# Patient Record
Sex: Female | Born: 1950
Health system: Southern US, Community
[De-identification: ages and names within clinical notes are randomized; demographics above are authoritative.]

## PROBLEM LIST (undated history)

## (undated) DIAGNOSIS — D631 Anemia in chronic kidney disease: Secondary | ICD-10-CM

## (undated) DIAGNOSIS — K5790 Diverticulosis of intestine, part unspecified, without perforation or abscess without bleeding: Secondary | ICD-10-CM

## (undated) DIAGNOSIS — E669 Obesity, unspecified: Secondary | ICD-10-CM

## (undated) DIAGNOSIS — I2729 Other secondary pulmonary hypertension: Secondary | ICD-10-CM

## (undated) DIAGNOSIS — N186 End stage renal disease: Secondary | ICD-10-CM

## (undated) DIAGNOSIS — Z87891 Personal history of nicotine dependence: Secondary | ICD-10-CM

## (undated) DIAGNOSIS — R519 Headache, unspecified: Secondary | ICD-10-CM

## (undated) DIAGNOSIS — R0600 Dyspnea, unspecified: Secondary | ICD-10-CM

## (undated) DIAGNOSIS — M1990 Unspecified osteoarthritis, unspecified site: Secondary | ICD-10-CM

## (undated) DIAGNOSIS — I2510 Atherosclerotic heart disease of native coronary artery without angina pectoris: Secondary | ICD-10-CM

## (undated) DIAGNOSIS — I700 Atherosclerosis of aorta: Secondary | ICD-10-CM

## (undated) DIAGNOSIS — E785 Hyperlipidemia, unspecified: Secondary | ICD-10-CM

## (undated) DIAGNOSIS — D249 Benign neoplasm of unspecified breast: Secondary | ICD-10-CM

## (undated) DIAGNOSIS — F419 Anxiety disorder, unspecified: Secondary | ICD-10-CM

## (undated) DIAGNOSIS — I5189 Other ill-defined heart diseases: Secondary | ICD-10-CM

## (undated) DIAGNOSIS — N189 Chronic kidney disease, unspecified: Secondary | ICD-10-CM

## (undated) DIAGNOSIS — D3501 Benign neoplasm of right adrenal gland: Secondary | ICD-10-CM

## (undated) DIAGNOSIS — I10 Essential (primary) hypertension: Secondary | ICD-10-CM

## (undated) DIAGNOSIS — I729 Aneurysm of unspecified site: Secondary | ICD-10-CM

## (undated) DIAGNOSIS — C642 Malignant neoplasm of left kidney, except renal pelvis: Secondary | ICD-10-CM

## (undated) DIAGNOSIS — K8020 Calculus of gallbladder without cholecystitis without obstruction: Secondary | ICD-10-CM

## (undated) DIAGNOSIS — J189 Pneumonia, unspecified organism: Secondary | ICD-10-CM

## (undated) DIAGNOSIS — Z992 Dependence on renal dialysis: Secondary | ICD-10-CM

## (undated) DIAGNOSIS — Z1211 Encounter for screening for malignant neoplasm of colon: Secondary | ICD-10-CM

## (undated) DIAGNOSIS — N6459 Other signs and symptoms in breast: Secondary | ICD-10-CM

## (undated) DIAGNOSIS — R928 Other abnormal and inconclusive findings on diagnostic imaging of breast: Secondary | ICD-10-CM

## (undated) DIAGNOSIS — J439 Emphysema, unspecified: Secondary | ICD-10-CM

## (undated) DIAGNOSIS — D649 Anemia, unspecified: Secondary | ICD-10-CM

## (undated) HISTORY — PX: EYE SURGERY: SHX253

## (undated) HISTORY — DX: Essential (primary) hypertension: I10

## (undated) HISTORY — DX: Personal history of nicotine dependence: Z87.891

## (undated) HISTORY — DX: Benign neoplasm of unspecified breast: D24.9

## (undated) HISTORY — DX: Other signs and symptoms in breast: N64.59

## (undated) HISTORY — DX: Obesity, unspecified: E66.9

## (undated) HISTORY — DX: Other abnormal and inconclusive findings on diagnostic imaging of breast: R92.8

## (undated) HISTORY — DX: Anemia, unspecified: D64.9

## (undated) HISTORY — DX: Encounter for screening for malignant neoplasm of colon: Z12.11

---

## 1991-12-01 HISTORY — PX: ABDOMINAL HYSTERECTOMY: SHX81

## 1995-12-01 HISTORY — PX: TONSILLECTOMY: SUR1361

## 1998-09-20 ENCOUNTER — Ambulatory Visit (HOSPITAL_BASED_OUTPATIENT_CLINIC_OR_DEPARTMENT_OTHER): Admission: RE | Admit: 1998-09-20 | Discharge: 1998-09-20 | Payer: Self-pay | Admitting: Otolaryngology

## 2001-11-30 HISTORY — PX: FOOT SURGERY: SHX648

## 2002-11-30 DIAGNOSIS — I729 Aneurysm of unspecified site: Secondary | ICD-10-CM

## 2002-11-30 DIAGNOSIS — I671 Cerebral aneurysm, nonruptured: Secondary | ICD-10-CM

## 2002-11-30 HISTORY — DX: Aneurysm of unspecified site: I72.9

## 2002-11-30 HISTORY — PX: CEREBRAL ANEURYSM REPAIR: SHX164

## 2002-11-30 HISTORY — DX: Cerebral aneurysm, nonruptured: I67.1

## 2003-06-27 DIAGNOSIS — I609 Nontraumatic subarachnoid hemorrhage, unspecified: Secondary | ICD-10-CM

## 2003-06-27 HISTORY — DX: Nontraumatic subarachnoid hemorrhage, unspecified: I60.9

## 2003-06-28 HISTORY — PX: ANEURYSM COILING: SHX5349

## 2003-10-29 HISTORY — PX: ANEURYSM COILING: SHX5349

## 2004-10-09 ENCOUNTER — Other Ambulatory Visit: Payer: Self-pay

## 2004-10-09 ENCOUNTER — Emergency Department: Payer: Self-pay | Admitting: Emergency Medicine

## 2005-04-09 HISTORY — PX: BREAST BIOPSY: SHX20

## 2006-11-30 HISTORY — PX: COLONOSCOPY: SHX174

## 2008-11-30 DIAGNOSIS — N6459 Other signs and symptoms in breast: Secondary | ICD-10-CM

## 2008-11-30 HISTORY — DX: Other signs and symptoms in breast: N64.59

## 2010-11-30 DIAGNOSIS — D249 Benign neoplasm of unspecified breast: Secondary | ICD-10-CM

## 2010-11-30 HISTORY — DX: Benign neoplasm of unspecified breast: D24.9

## 2010-11-30 HISTORY — PX: BREAST BIOPSY: SHX20

## 2011-01-19 ENCOUNTER — Ambulatory Visit: Payer: Self-pay | Admitting: General Surgery

## 2011-01-20 LAB — PATHOLOGY REPORT

## 2013-05-31 ENCOUNTER — Encounter: Payer: Self-pay | Admitting: *Deleted

## 2014-01-09 ENCOUNTER — Encounter: Payer: Self-pay | Admitting: General Surgery

## 2014-01-18 ENCOUNTER — Ambulatory Visit (INDEPENDENT_AMBULATORY_CARE_PROVIDER_SITE_OTHER): Payer: BC Managed Care – PPO | Admitting: General Surgery

## 2014-01-18 ENCOUNTER — Encounter: Payer: Self-pay | Admitting: General Surgery

## 2014-01-18 VITALS — BP 144/78 | HR 76 | Resp 12 | Ht 67.0 in | Wt 232.0 lb

## 2014-01-18 DIAGNOSIS — Z1231 Encounter for screening mammogram for malignant neoplasm of breast: Secondary | ICD-10-CM

## 2014-01-18 DIAGNOSIS — N6489 Other specified disorders of breast: Secondary | ICD-10-CM

## 2014-01-18 DIAGNOSIS — L905 Scar conditions and fibrosis of skin: Secondary | ICD-10-CM

## 2014-01-18 NOTE — Progress Notes (Signed)
Patient ID: Sarah [name], female   DOB: 1951-05-13, 63 y.o.   MRN: [id number]  Chief Complaint  Patient presents with  . Follow-up    mammogram    HPI Sarah [name] is a 63 y.o. female. who presents for a breast evaluation. The most recent mammogram and ultrasound was done on 01/09/14.Patient does perform regular self breast checks and gets regular mammograms done.    HPI  Past Medical History  Diagnosis Date  . Hypertension   . Benign neoplasm of breast 2012  . Personal history of tobacco use, presenting hazards to health   . Abnormal mammogram, unspecified   . Special screening for malignant neoplasms, colon   . Obesity, unspecified   . Breast complaint 2010    Past Surgical History  Procedure Laterality Date  . Tonsillectomy  1997  . Abdominal hysterectomy  1993  . Foot surgery  2003  . Eye surgery    . Colonoscopy  2008    Dr. Nicolasa Ducking  . Cerebral aneurysm repair  2004  . Breast biopsy Right 2012    stereo bx showing a 2mm radial scar in an estimated 8 volume of tissue  . Breast biopsy Right Apr 09, 2005    Fibrocystic changes, ductal adenosis, microcalcifications, focal stromal/epithelial proliferation, possible fibroadenoma type proliferation    Family History  Problem Relation Age of Onset  . Cancer Cousin     maternal first cousin with breast cancer  . Cancer Other     colon and ovarian cancers, no relationship listed    Social History History  Substance Use Topics  . Smoking status: Current Every Day Smoker -- 1.00 packs/day for 40 years    Types: Cigarettes  . Smokeless tobacco: Never Used  . Alcohol Use: Yes    Allergies  Allergen Reactions  . Codeine Itching    Current Outpatient Prescriptions  Medication Sig Dispense Refill  . amLODipine (NORVASC) 5 MG tablet Take 1 tablet by mouth daily.      Marland Kitchen atenolol (TENORMIN) 100 MG tablet Take 1 tablet by mouth daily.      . benazepril-hydrochlorthiazide (LOTENSIN HCT) 20-25 MG per tablet Take 1 tablet  by mouth daily.      . citalopram (CELEXA) 20 MG tablet Take 1 tablet by mouth daily.       No current facility-administered medications for this visit.    Review of Systems Review of Systems  Constitutional: Negative.   Respiratory: Negative.   Cardiovascular: Negative.     Blood pressure 144/78, pulse 76, resp. rate 12, height 5\' 7"  (1.702 m), weight 232 lb (105.235 kg).  Physical Exam Physical Exam  Constitutional: She is oriented to person, place, and time. She appears well-developed and well-nourished.  Eyes: Conjunctivae are normal.  Cardiovascular: Normal rate, regular rhythm and normal heart sounds.   Pulmonary/Chest: Effort normal and breath sounds normal. Right breast exhibits no inverted nipple, no mass, no nipple discharge, no skin change and no tenderness. Left breast exhibits no inverted nipple, no mass, no nipple discharge, no skin change and no tenderness.  Lymphadenopathy:    She has no cervical adenopathy.    She has no axillary adenopathy.  Neurological: She is alert and oriented to person, place, and time.  Skin: Skin is warm and dry.    Data Reviewed Screening mammogram dated 01/09/2014 showed no interval change. BI-RAD-2. Radiologist note regarding her interpretation of the importance radial scar reviewed.  Assessment    Stable mammogram now 3 years status post biopsy with  a 6 mm radial scar excised the    Plan    No evidence of recurrent changes to suggest radial scar or malignancy. Benign clinical exam. The patient will continue annual screening mammograms with her primary care provider.       Robert Bellow 01/19/2014, 12:15 PM

## 2014-01-18 NOTE — Patient Instructions (Signed)
Patient to return as needed.Call us with problems or concerns.

## 2014-01-19 ENCOUNTER — Encounter: Payer: Self-pay | Admitting: General Surgery

## 2014-01-19 DIAGNOSIS — N6489 Other specified disorders of breast: Secondary | ICD-10-CM | POA: Insufficient documentation

## 2014-01-19 DIAGNOSIS — Z1231 Encounter for screening mammogram for malignant neoplasm of breast: Secondary | ICD-10-CM | POA: Insufficient documentation

## 2014-02-05 ENCOUNTER — Encounter: Payer: Self-pay | Admitting: General Surgery

## 2014-10-01 ENCOUNTER — Encounter: Payer: Self-pay | Admitting: General Surgery

## 2014-12-04 ENCOUNTER — Ambulatory Visit: Payer: Self-pay | Admitting: Gastroenterology

## 2014-12-04 LAB — HM COLONOSCOPY

## 2015-03-25 LAB — SURGICAL PATHOLOGY

## 2015-11-11 ENCOUNTER — Encounter: Payer: Self-pay | Admitting: Urgent Care

## 2015-11-11 ENCOUNTER — Emergency Department
Admission: EM | Admit: 2015-11-11 | Discharge: 2015-11-11 | Disposition: A | Discharge status: Home / Self Care | Payer: Worker's Compensation | Attending: Emergency Medicine | Admitting: Emergency Medicine

## 2015-11-11 ENCOUNTER — Emergency Department: Payer: Worker's Compensation

## 2015-11-11 DIAGNOSIS — S8011XA Contusion of right lower leg, initial encounter: Secondary | ICD-10-CM | POA: Diagnosis not present

## 2015-11-11 DIAGNOSIS — I10 Essential (primary) hypertension: Secondary | ICD-10-CM | POA: Diagnosis not present

## 2015-11-11 DIAGNOSIS — Y9289 Other specified places as the place of occurrence of the external cause: Secondary | ICD-10-CM | POA: Diagnosis not present

## 2015-11-11 DIAGNOSIS — W010XXA Fall on same level from slipping, tripping and stumbling without subsequent striking against object, initial encounter: Secondary | ICD-10-CM | POA: Insufficient documentation

## 2015-11-11 DIAGNOSIS — Z79899 Other long term (current) drug therapy: Secondary | ICD-10-CM | POA: Insufficient documentation

## 2015-11-11 DIAGNOSIS — F17210 Nicotine dependence, cigarettes, uncomplicated: Secondary | ICD-10-CM | POA: Diagnosis not present

## 2015-11-11 DIAGNOSIS — W19XXXA Unspecified fall, initial encounter: Secondary | ICD-10-CM

## 2015-11-11 DIAGNOSIS — S8001XA Contusion of right knee, initial encounter: Secondary | ICD-10-CM | POA: Diagnosis not present

## 2015-11-11 DIAGNOSIS — Y998 Other external cause status: Secondary | ICD-10-CM | POA: Insufficient documentation

## 2015-11-11 DIAGNOSIS — Y9389 Activity, other specified: Secondary | ICD-10-CM | POA: Diagnosis not present

## 2015-11-11 DIAGNOSIS — S8991XA Unspecified injury of right lower leg, initial encounter: Secondary | ICD-10-CM | POA: Diagnosis present

## 2015-11-11 HISTORY — DX: Aneurysm of unspecified site: I72.9

## 2015-11-11 MED ORDER — IBUPROFEN 800 MG PO TABS
ORAL_TABLET | ORAL | Status: AC
  Administered 2015-11-11: 800 mg via ORAL
  Filled 2015-11-11: qty 1

## 2015-11-11 MED ORDER — CYCLOBENZAPRINE HCL 5 MG PO TABS: 5.0000 mg | ORAL_TABLET | Freq: Three times a day (TID) | ORAL | Status: DC | PRN

## 2015-11-11 MED ORDER — IBUPROFEN 800 MG PO TABS: 800.0000 mg | ORAL_TABLET | Freq: Three times a day (TID) | ORAL | Status: DC | PRN

## 2015-11-11 MED ORDER — IBUPROFEN 800 MG PO TABS
800.0000 mg | ORAL_TABLET | Freq: Once | ORAL | Status: AC
  Administered 2015-11-11: 800 mg via ORAL

## 2015-11-11 MED ORDER — HYDROCODONE-ACETAMINOPHEN 5-325 MG PO TABS: 1.0000 | ORAL_TABLET | ORAL | Status: DC | PRN

## 2015-11-11 NOTE — ED Provider Notes (Signed)
[facility] Emergency Department Provider Note  ____________________________________________  Time seen: Approximately 8:56 PM  I have reviewed the triage vital signs and the nursing notes.   HISTORY  Chief Complaint Fall    HPI Sarah [name] is a 64 y.o. female who presents for evaluation of right knee pain. 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 She complains of pain to the knee.   Past Medical History  Diagnosis Date  . Hypertension   . Benign neoplasm of breast 2012  . Personal history of tobacco use, presenting hazards to health   . Abnormal mammogram, unspecified   . Special screening for malignant neoplasms, colon   . Obesity, unspecified   . Breast complaint 2010  . Aneurysm ([place])     cerebral x  4    Patient Active Problem List   Diagnosis Date Noted  . Radial scar of breast 01/19/2014  . Other screening mammogram 01/19/2014    Past Surgical History  Procedure Laterality Date  . Tonsillectomy  1997  . Abdominal hysterectomy  1993  . Foot surgery  2003  . Eye surgery    . Colonoscopy  2008    Dr. Nicolasa Ducking  . Cerebral aneurysm repair  2004  . Breast biopsy Right 2012    stereo bx showing a 37mm radial scar in an estimated 8 volume of tissue  . Breast biopsy Right Apr 09, 2005    Fibrocystic changes, ductal adenosis, microcalcifications, focal stromal/epithelial proliferation, possible fibroadenoma type proliferation    Current Outpatient Rx  Name  Route  Sig  Dispense  Refill  . amLODipine (NORVASC) 5 MG tablet   Oral   Take 1 tablet by mouth daily.         Marland Kitchen atenolol (TENORMIN) 100 MG tablet   Oral   Take 1 tablet by mouth daily.         . benazepril-hydrochlorthiazide (LOTENSIN HCT) 20-25 MG per tablet   Oral   Take 1 tablet by mouth daily.         . citalopram (CELEXA) 20 MG tablet   Oral   Take 1 tablet by mouth daily.         . cyclobenzaprine (FLEXERIL) 5 MG  tablet   Oral   Take 1 tablet (5 mg total) by mouth every 8 (eight) hours as needed for muscle spasms.   30 tablet   0   . HYDROcodone-acetaminophen (NORCO) 5-325 MG tablet   Oral   Take 1-2 tablets by mouth every 4 (four) hours as needed for moderate pain.   8 tablet   0   . ibuprofen (ADVIL,MOTRIN) 800 MG tablet   Oral   Take 1 tablet (800 mg total) by mouth every 8 (eight) hours as needed.   30 tablet   0     Allergies Codeine  Family History  Problem Relation Age of Onset  . Cancer Cousin     maternal first cousin with breast cancer  . Cancer Other     colon and ovarian cancers, no relationship listed    Social History Social History  Substance Use Topics  . Smoking status: Current Every Day Smoker -- 1.00 packs/day for 40 years    Types: Cigarettes  . Smokeless tobacco: Never Used  . Alcohol Use: Yes    Review of Systems Constitutional: No fever/chills Eyes: No visual changes. ENT: No sore throat. Cardiovascular: Denies chest pain. Respiratory: Denies shortness of breath. Gastrointestinal:  No abdominal pain.  No nausea, no vomiting.  No diarrhea.  No constipation. Genitourinary: Negative for dysuria. Musculoskeletal: Positive for right knee pain. Skin: Negative for rash. Neurological: Negative for headaches, focal weakness or numbness.  10-point ROS otherwise negative.  ____________________________________________   PHYSICAL EXAM:  VITAL SIGNS: ED Triage Vitals  Enc Vitals Group     BP 11/11/15 2044 140/55 mmHg     Pulse Rate 11/11/15 2041 70     Resp 11/11/15 2044 18     Temp 11/11/15 2041 98.2 F (36.8 C)     Temp Source 11/11/15 2041 Oral     SpO2 11/11/15 2044 95 %     Weight 11/11/15 2044 220 lb (99.791 kg)     Height 11/11/15 2044 5\' 6"  (1.676 m)     Head Cir --      Peak Flow --      Pain Score 11/11/15 2048 4     Pain Loc --      Pain Edu? --      Excl. in [place]? --     Constitutional: Alert and oriented. Well appearing and in  no acute distress. Cardiovascular: Normal rate, regular rhythm. Grossly normal heart sounds.  Good peripheral circulation. Respiratory: Normal respiratory effort.  No retractions. Lungs CTAB. Musculoskeletal: Right knee with extreme point tenderness. Limited range of motion minimal warmth. No effusion palpated Neurologic:  Normal speech and language. No gross focal neurologic deficits are appreciated. No gait instability. Skin:  Skin is warm, dry and intact. No rash noted. Psychiatric: Mood and affect are normal. Speech and behavior are normal.  ____________________________________________   LABS (all labs ordered are listed, but only abnormal results are displayed)  Labs Reviewed - No data to display ____________________________________________   RADIOLOGY  Degenerative change, without acute osseous finding. Small suprapatellar joint effusion.    PROCEDURES  Procedure(s) performed: None  Critical Care performed: No  ____________________________________________   INITIAL IMPRESSION / ASSESSMENT AND PLAN / ED COURSE  Pertinent labs & imaging results that were available during my care of the patient were reviewed by me and considered in my medical decision making (see chart for details).  Status post fall with right knee contusion. Rx given for Patient voices no other emergency medical complaints at this time Motrin 800 mg 3 times a day as needed for pain. In addition patient was given some Norco 5/325. She is to follow-up with her PCP or return to the ER with any worsening symptomology. Patient voices no other emergency medical complaints at this time ____________________________________________   FINAL CLINICAL IMPRESSION(S) / ED DIAGNOSES  Final diagnoses:  Contusion of right knee and lower leg, initial encounter  Fall, initial encounter      Arlyss Repress, PA-C 11/11/15 2220  Ahmed Prima, MD 11/17/15 [phone]

## 2015-11-11 NOTE — ED Notes (Signed)
Patient presents c/p fall while at work tonight. Patient reports that she was performing her normal job duties at [employer] - tripped over a client's wheelchair and fell to the floor. Patient presents with a "twinge" to her RUE, RIGHT lower back, and RIGHT knee.

## 2015-11-11 NOTE — ED Notes (Signed)
Employed by [employer] - requires UDS only. Collected in triage by ED tech per protocol.

## 2016-01-06 ENCOUNTER — Encounter: Payer: Self-pay | Admitting: Unknown Physician Specialty

## 2016-01-06 ENCOUNTER — Telehealth: Payer: Self-pay

## 2016-01-06 ENCOUNTER — Ambulatory Visit (INDEPENDENT_AMBULATORY_CARE_PROVIDER_SITE_OTHER): Payer: 59 | Admitting: Unknown Physician Specialty

## 2016-01-06 VITALS — BP 146/81 | HR 83 | Temp 98.3°F | Ht 65.0 in | Wt 218.2 lb

## 2016-01-06 DIAGNOSIS — J069 Acute upper respiratory infection, unspecified: Secondary | ICD-10-CM

## 2016-01-06 MED ORDER — BENZONATATE 100 MG PO CAPS: 100.0000 mg | ORAL_CAPSULE | Freq: Two times a day (BID) | ORAL | Status: DC | PRN

## 2016-01-06 NOTE — Telephone Encounter (Signed)
Patient came in for visit and stated she got her flu shot at her pharmacy. So I called them and they stated she got it 09/04/15.

## 2016-01-06 NOTE — Patient Instructions (Signed)
Upper Respiratory Infection, Adult Most upper respiratory infections (URIs) are a viral infection of the air passages leading to the lungs. A URI affects the nose, throat, and upper air passages. The most common type of URI is nasopharyngitis and is typically referred to as "the common cold." URIs run their course and usually go away on their own. Most of the time, a URI does not require medical attention, but sometimes a bacterial infection in the upper airways can follow a viral infection. This is called a secondary infection. Sinus and middle ear infections are common types of secondary upper respiratory infections. Bacterial pneumonia can also complicate a URI. A URI can worsen asthma and chronic obstructive pulmonary disease (COPD). Sometimes, these complications can require emergency medical care and may be life threatening.  CAUSES Almost all URIs are caused by viruses. A virus is a type of germ and can spread from one person to another.  RISKS FACTORS You may be at risk for a URI if:   You smoke.   You have chronic heart or lung disease.  You have a weakened defense (immune) system.   You are very young or very old.   You have nasal allergies or asthma.  You work in crowded or poorly ventilated areas.  You work in health care facilities or schools. SIGNS AND SYMPTOMS  Symptoms typically develop 2-3 days after you come in contact with a cold virus. Most viral URIs last 7-10 days. However, viral URIs from the influenza virus (flu virus) can last 14-18 days and are typically more severe. Symptoms may include:   Runny or stuffy (congested) nose.   Sneezing.   Cough.   Sore throat.   Headache.   Fatigue.   Fever.   Loss of appetite.   Pain in your forehead, behind your eyes, and over your cheekbones (sinus pain).  Muscle aches.  DIAGNOSIS  Your health care provider may diagnose a URI by:  Physical exam.  Tests to check that your symptoms are not due to  another condition such as:  Strep throat.  Sinusitis.  Pneumonia.  Asthma. TREATMENT  A URI goes away on its own with time. It cannot be cured with medicines, but medicines may be prescribed or recommended to relieve symptoms. Medicines may help:  Reduce your fever.  Reduce your cough.  Relieve nasal congestion. HOME CARE INSTRUCTIONS   Take medicines only as directed by your health care provider.   Gargle warm saltwater or take cough drops to comfort your throat as directed by your health care provider.  Use a warm mist humidifier or inhale steam from a shower to increase air moisture. This may make it easier to breathe.  Drink enough fluid to keep your urine clear or pale yellow.   Eat soups and other clear broths and maintain good nutrition.   Rest as needed.   Return to work when your temperature has returned to normal or as your health care provider advises. You may need to stay home longer to avoid infecting others. You can also use a face mask and careful hand washing to prevent spread of the virus.  Increase the usage of your inhaler if you have asthma.   Do not use any tobacco products, including cigarettes, chewing tobacco, or electronic cigarettes. If you need help quitting, ask your health care provider. PREVENTION  The best way to protect yourself from getting a cold is to practice good hygiene.   Avoid oral or hand contact with people with cold   symptoms.   Wash your hands often if contact occurs.  There is no clear evidence that vitamin C, vitamin E, echinacea, or exercise reduces the chance of developing a cold. However, it is always recommended to get plenty of rest, exercise, and practice good nutrition.  SEEK MEDICAL CARE IF:   You are getting worse rather than better.   Your symptoms are not controlled by medicine.   You have chills.  You have worsening shortness of breath.  You have brown or red mucus.  You have yellow or brown nasal  discharge.  You have pain in your face, especially when you bend forward.  You have a fever.  You have swollen neck glands.  You have pain while swallowing.  You have white areas in the back of your throat. SEEK IMMEDIATE MEDICAL CARE IF:   You have severe or persistent:  Headache.  Ear pain.  Sinus pain.  Chest pain.  You have chronic lung disease and any of the following:  Wheezing.  Prolonged cough.  Coughing up blood.  A change in your usual mucus.  You have a stiff neck.  You have changes in your:  Vision.  Hearing.  Thinking.  Mood. MAKE SURE YOU:   Understand these instructions.  Will watch your condition.  Will get help right away if you are not doing well or get worse.   This information is not intended to replace advice given to you by your health care provider. Make sure you discuss any questions you have with your health care provider.   Document Released: 05/12/2001 Document Revised: 04/02/2015 Document Reviewed: 02/21/2014 Elsevier Interactive Patient Education 2016 [employer]  

## 2016-01-06 NOTE — Progress Notes (Signed)
BP 146/81 mmHg  Pulse 83  Temp(Src) 98.3 F (36.8 C)  Ht 5\' 5"  (1.651 m)  Wt 218 lb 3.2 oz (98.975 kg)  BMI 36.31 kg/m2  SpO2 96%   Subjective:    Patient ID: Sarah [name], female    DOB: May 25, 1951, 65 y.o.   MRN: [id number]  HPI: Sarah [name] is a 65 y.o. female  Chief Complaint  Patient presents with  . URI    Pt states she has congestion, chills, coughing, stuffy nose, sneezing, no appetite, and dry throat. States symptoms started Saturday   URI: Patient complains of congestion, chills, cough, stuffy nose, sneezing, dry throat, headache, and no appetite. 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  Patient states she had fever Saturday but doesn't recall the temperature.  Has taken OTC cold medicine, Dayquil, Tylenol, and nasal spray for stuffy nose with no relief. Flu shot was   Relevant past medical, surgical, family and social history reviewed and updated as indicated. Interim medical history since our last visit reviewed. Allergies and medications reviewed and updated.  Review of Systems  Constitutional: Positive for chills, appetite change and fatigue.  HENT: Positive for congestion, rhinorrhea, sinus pressure and sneezing.   Eyes: Negative.   Respiratory: Positive for cough.   Cardiovascular: Negative.   Gastrointestinal: Negative.   Endocrine: Negative.   Genitourinary: Negative.   Musculoskeletal: Negative.   Skin: Negative.   Allergic/Immunologic: Negative.   Neurological: Negative.   Hematological: Negative.   Psychiatric/Behavioral: Negative.     Per HPI unless specifically indicated above     Objective:    BP 146/81 mmHg  Pulse 83  Temp(Src) 98.3 F (36.8 C)  Ht 5\' 5"  (1.651 m)  Wt 218 lb 3.2 oz (98.975 kg)  BMI 36.31 kg/m2  SpO2 96%  Wt Readings from Last 3 Encounters:  01/06/16 218 lb 3.2 oz (98.975 kg)  11/11/15 220 lb (99.791 kg)  01/18/14 232 lb (105.235 kg)    Physical Exam  Constitutional: She is oriented to  person, place, and time. She appears well-developed and well-nourished. No distress.  HENT:  Head: Normocephalic and atraumatic.  Right Ear: Tympanic membrane and ear canal normal.  Left Ear: Tympanic membrane and ear canal normal.  Nose: No rhinorrhea. Right sinus exhibits no frontal sinus tenderness. Left sinus exhibits no frontal sinus tenderness.  Mouth/Throat: Mucous membranes are normal. Posterior oropharyngeal erythema present.  Eyes: Conjunctivae and lids are normal. Right eye exhibits no discharge. Left eye exhibits no discharge. No scleral icterus.  Cardiovascular: Normal rate and regular rhythm.   Pulmonary/Chest: Effort normal and breath sounds normal. No respiratory distress.  Abdominal: Normal appearance. There is no splenomegaly or hepatomegaly.  Musculoskeletal: Normal range of motion.  Neurological: She is alert and oriented to person, place, and time.  Skin: Skin is intact. No rash noted. No pallor.  Psychiatric: She has a normal mood and affect. Her behavior is normal. Judgment and thought content normal.    Results for orders placed or performed in visit on 01/06/16  HM COLONOSCOPY  Result Value Ref Range   HM Colonoscopy from PP       Assessment & Plan:   Problem List Items Addressed This Visit    None    Visit Diagnoses    Upper respiratory infection    -  Primary    Relevant Orders    Influenza a and b        Follow up plan: Return for  Needs physical.

## 2016-02-15 ENCOUNTER — Other Ambulatory Visit: Payer: Self-pay | Admitting: Unknown Physician Specialty

## 2016-02-16 NOTE — Telephone Encounter (Signed)
Pt needs check further refills.  It looks like she is also dur for a physical

## 2016-02-17 NOTE — Telephone Encounter (Signed)
Patient has physical scheduled 05/01/16.

## 2016-03-13 ENCOUNTER — Other Ambulatory Visit: Payer: Self-pay | Admitting: Unknown Physician Specialty

## 2016-04-22 ENCOUNTER — Emergency Department
Admission: EM | Admit: 2016-04-22 | Discharge: 2016-04-22 | Disposition: A | Discharge status: Home / Self Care | Payer: 59 | Attending: Emergency Medicine | Admitting: Emergency Medicine

## 2016-04-22 ENCOUNTER — Emergency Department: Payer: 59

## 2016-04-22 ENCOUNTER — Encounter: Payer: Self-pay | Admitting: Emergency Medicine

## 2016-04-22 ENCOUNTER — Other Ambulatory Visit: Payer: Self-pay | Admitting: Unknown Physician Specialty

## 2016-04-22 DIAGNOSIS — E669 Obesity, unspecified: Secondary | ICD-10-CM | POA: Insufficient documentation

## 2016-04-22 DIAGNOSIS — K529 Noninfective gastroenteritis and colitis, unspecified: Secondary | ICD-10-CM | POA: Diagnosis not present

## 2016-04-22 DIAGNOSIS — R112 Nausea with vomiting, unspecified: Secondary | ICD-10-CM | POA: Diagnosis present

## 2016-04-22 DIAGNOSIS — Z791 Long term (current) use of non-steroidal anti-inflammatories (NSAID): Secondary | ICD-10-CM | POA: Diagnosis not present

## 2016-04-22 DIAGNOSIS — Z79899 Other long term (current) drug therapy: Secondary | ICD-10-CM | POA: Diagnosis not present

## 2016-04-22 DIAGNOSIS — I10 Essential (primary) hypertension: Secondary | ICD-10-CM | POA: Diagnosis not present

## 2016-04-22 DIAGNOSIS — F17210 Nicotine dependence, cigarettes, uncomplicated: Secondary | ICD-10-CM | POA: Insufficient documentation

## 2016-04-22 DIAGNOSIS — Z86000 Personal history of in-situ neoplasm of breast: Secondary | ICD-10-CM | POA: Insufficient documentation

## 2016-04-22 DIAGNOSIS — Z85038 Personal history of other malignant neoplasm of large intestine: Secondary | ICD-10-CM | POA: Diagnosis not present

## 2016-04-22 DIAGNOSIS — Z7982 Long term (current) use of aspirin: Secondary | ICD-10-CM | POA: Insufficient documentation

## 2016-04-22 LAB — CBC WITH DIFFERENTIAL/PLATELET
Basophils Absolute: 0 10*3/uL (ref 0–0.1)
Basophils Relative: 1 %
EOS PCT: 2 %
Eosinophils Absolute: 0.1 10*3/uL (ref 0–0.7)
HCT: 41.6 % (ref 35.0–47.0)
HEMOGLOBIN: 13.8 g/dL (ref 12.0–16.0)
LYMPHS ABS: 2.1 10*3/uL (ref 1.0–3.6)
Lymphocytes Relative: 35 %
MCH: 29.2 pg (ref 26.0–34.0)
MCHC: 33.2 g/dL (ref 32.0–36.0)
MCV: 87.9 fL (ref 80.0–100.0)
MONO ABS: 1 10*3/uL — AB (ref 0.2–0.9)
Monocytes Relative: 16 %
Neutro Abs: 2.8 10*3/uL (ref 1.4–6.5)
Neutrophils Relative %: 46 %
PLATELETS: 266 10*3/uL (ref 150–440)
RBC: 4.73 MIL/uL (ref 3.80–5.20)
RDW: 14.2 % (ref 11.5–14.5)
WBC: 6.1 10*3/uL (ref 3.6–11.0)

## 2016-04-22 LAB — COMPREHENSIVE METABOLIC PANEL
ALT: 19 U/L (ref 14–54)
AST: 25 U/L (ref 15–41)
Albumin: 4 g/dL (ref 3.5–5.0)
Alkaline Phosphatase: 85 U/L (ref 38–126)
Anion gap: 10 (ref 5–15)
BUN: 21 mg/dL — AB (ref 6–20)
CO2: 22 mmol/L (ref 22–32)
CREATININE: 1.64 mg/dL — AB (ref 0.44–1.00)
Calcium: 9.2 mg/dL (ref 8.9–10.3)
Chloride: 104 mmol/L (ref 101–111)
GFR calc Af Amer: 37 mL/min — ABNORMAL LOW (ref >60)
GFR calc non Af Amer: 32 mL/min — ABNORMAL LOW (ref >60)
Glucose, Bld: 110 mg/dL — ABNORMAL HIGH (ref 65–99)
POTASSIUM: 3.2 mmol/L — AB (ref 3.5–5.1)
Sodium: 136 mmol/L (ref 135–145)
TOTAL PROTEIN: 8.2 g/dL — AB (ref 6.5–8.1)
Total Bilirubin: 0.4 mg/dL (ref 0.3–1.2)

## 2016-04-22 LAB — LIPASE, BLOOD: Lipase: 30 U/L (ref 11–51)

## 2016-04-22 LAB — AMYLASE: Amylase: 50 U/L (ref 28–100)

## 2016-04-22 MED ORDER — LIDOCAINE HCL (PF) 1 % IJ SOLN
INTRAMUSCULAR | Status: AC
  Filled 2016-04-22: qty 5

## 2016-04-22 MED ORDER — PROMETHAZINE HCL 25 MG PO TABS: 25.0000 mg | ORAL_TABLET | Freq: Four times a day (QID) | ORAL | Status: DC | PRN

## 2016-04-22 MED ORDER — SODIUM CHLORIDE 0.9 % IV BOLUS (SEPSIS)
1000.0000 mL | Freq: Once | INTRAVENOUS | Status: AC
  Administered 2016-04-22: 1000 mL via INTRAVENOUS

## 2016-04-22 MED ORDER — DIPHENOXYLATE-ATROPINE 2.5-0.025 MG PO TABS: 1.0000 | ORAL_TABLET | Freq: Four times a day (QID) | ORAL | Status: DC | PRN

## 2016-04-22 MED ORDER — ONDANSETRON HCL 4 MG/2ML IJ SOLN
4.0000 mg | Freq: Once | INTRAMUSCULAR | Status: AC
  Administered 2016-04-22: 4 mg via INTRAVENOUS
  Filled 2016-04-22: qty 2

## 2016-04-22 NOTE — ED Notes (Signed)
Pt c/o n/v/d since Sunday. Reports chills Sunday and Monday. Pt denies nausea at this time, but concerned that she has no appetite. NAD noted. Last emesis yesterday. Last diarrhea this morning at 0630

## 2016-04-22 NOTE — ED Provider Notes (Signed)
[facility] Emergency Department Provider Note  ____________________________________________  Time seen: Approximately 11:23 AM  I have reviewed the triage vital signs and the nursing notes.   HISTORY  Chief Complaint Emesis and Diarrhea    HPI Sarah [name] is a 65 y.o. female who presents for evaluation of 3 day history of nausea, vomiting, diarrhea. Also has some mild lower abdominal pain. 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 Patient reports last vomiting yesterday and last diarrhea which was copious watery this morning. States that the diarrhea does wake her up  during the night.Denies any fever and chills. Feels weak rundown no energy.   Past Medical History  Diagnosis Date  . Hypertension   . Benign neoplasm of breast 2012  . Personal history of tobacco use, presenting hazards to health   . Abnormal mammogram, unspecified   . Special screening for malignant neoplasms, colon   . Obesity, unspecified   . Breast complaint 2010  . Aneurysm ([place])     cerebral x  4    Patient Active Problem List   Diagnosis Date Noted  . Radial scar of breast 01/19/2014  . Other screening mammogram 01/19/2014    Past Surgical History  Procedure Laterality Date  . Tonsillectomy  1997  . Abdominal hysterectomy  1993  . Foot surgery  2003  . Eye surgery    . Colonoscopy  2008    Dr. Nicolasa Ducking  . Cerebral aneurysm repair  2004  . Breast biopsy Right 2012    stereo bx showing a 57mm radial scar in an estimated 8 volume of tissue  . Breast biopsy Right Apr 09, 2005    Fibrocystic changes, ductal adenosis, microcalcifications, focal stromal/epithelial proliferation, possible fibroadenoma type proliferation    Current Outpatient Rx  Name  Route  Sig  Dispense  Refill  . amLODipine (NORVASC) 5 MG tablet      1 TABLET DAILY ORAL   30 tablet   6   . aspirin 81 MG tablet   Oral   Take 81 mg by mouth  daily.         Marland Kitchen atenolol (TENORMIN) 100 MG tablet      1 TABLET ONCE EACH DAY ORAL   30 tablet   6   . benazepril-hydrochlorthiazide (LOTENSIN HCT) 20-25 MG tablet      TAKE 1 TABLET EVERY DAY   30 tablet   6   . benzonatate (TESSALON) 100 MG capsule   Oral   Take 1 capsule (100 mg total) by mouth 2 (two) times daily as needed for cough.   20 capsule   0   . Cholecalciferol (VITAMIN D-3 PO)   Oral   Take 600 Units by mouth daily.         . citalopram (CELEXA) 20 MG tablet      TAKE 1 TABLET BY MOUTH AT BEDTIME   30 tablet   6   . cyclobenzaprine (FLEXERIL) 5 MG tablet   Oral   Take 1 tablet (5 mg total) by mouth every 8 (eight) hours as needed for muscle spasms.   30 tablet   0   . diphenoxylate-atropine (LOMOTIL) 2.5-0.025 MG tablet   Oral   Take 1 tablet by mouth 4 (four) times daily as needed for diarrhea or loose stools.   12 tablet   0   . HYDROcodone-acetaminophen (NORCO) 5-325 MG tablet   Oral  Take 1-2 tablets by mouth every 4 (four) hours as needed for moderate pain.   8 tablet   0   . ibuprofen (ADVIL,MOTRIN) 800 MG tablet   Oral   Take 1 tablet (800 mg total) by mouth every 8 (eight) hours as needed.   30 tablet   0   . Multiple Vitamin (MULTIVITAMIN) tablet   Oral   Take 1 tablet by mouth daily.         . promethazine (PHENERGAN) 25 MG tablet   Oral   Take 1 tablet (25 mg total) by mouth every 6 (six) hours as needed for nausea or vomiting.   12 tablet   0     Allergies Codeine  Family History  Problem Relation Age of Onset  . Cancer Cousin     maternal first cousin with breast cancer  . Cancer Other     colon and ovarian cancers, no relationship listed  . Cancer Father     prostate    Social History Social History  Substance Use Topics  . Smoking status: Current Every Day Smoker -- 1.00 packs/day for 40 years    Types: Cigarettes  . Smokeless tobacco: Never Used  . Alcohol Use: Yes     Comment: wine on occasion     Review of Systems Constitutional: No fever/chills Eyes: No visual changes. ENT: No sore throat. Cardiovascular: Denies chest pain. Respiratory: Denies shortness of breath. Gastrointestinal: Positive for abdominal pain, nausea, vomiting, diarrhea. Genitourinary: Negative for dysuria. Musculoskeletal: Negative for back pain. Skin: Negative for rash. Neurological: Negative for headaches, focal weakness or numbness.  10-point ROS otherwise negative.  ____________________________________________   PHYSICAL EXAM:  VITAL SIGNS: ED Triage Vitals  Enc Vitals Group     BP 04/22/16 1049 107/65 mmHg     Pulse Rate 04/22/16 1049 116     Resp 04/22/16 1049 20     Temp 04/22/16 1049 98.1 F (36.7 C)     Temp Source 04/22/16 1049 Oral     SpO2 04/22/16 1049 97 %     Weight 04/22/16 1049 230 lb (104.327 kg)     Height 04/22/16 1049 5' 6.5" (1.689 m)     Head Cir --      Peak Flow --      Pain Score --      Pain Loc --      Pain Edu? --      Excl. in [place]? --     Constitutional: Alert and oriented. Well appearing and in no acute distress. Nose: No congestion/rhinnorhea. Mouth/Throat: Mucous membranes are moist.  Oropharynx non-erythematous. Neck: No stridor.   Cardiovascular: Normal rate, regular rhythm, Mildly tachycardic. Grossly normal heart sounds.  Good peripheral circulation. Respiratory: Normal respiratory effort.  No retractions. Lungs CTAB. Gastrointestinal: Soft and Mildly tender throughout.. No distention. No abdominal bruits. No CVA tenderness. Musculoskeletal: No lower extremity tenderness nor edema.  No joint effusions. Neurologic:  Normal speech and language. No gross focal neurologic deficits are appreciated. No gait instability. Skin:  Skin is warm, dry and intact. No rash noted. Good turgor. Psychiatric: Mood and affect are normal. Speech and behavior are normal.  ____________________________________________   LABS (all labs ordered are listed, but only  abnormal results are displayed)  Labs Reviewed  CBC WITH DIFFERENTIAL/PLATELET - Abnormal; Notable for the following:    Monocytes Absolute 1.0 (*)    All other components within normal limits  COMPREHENSIVE METABOLIC PANEL - Abnormal; Notable for the following:  Potassium 3.2 (*)    Glucose, Bld 110 (*)    BUN 21 (*)    Creatinine, Ser 1.64 (*)    Total Protein 8.2 (*)    GFR calc non Af Amer 32 (*)    GFR calc Af Amer 37 (*)    All other components within normal limits  AMYLASE  LIPASE, BLOOD   ____________________________________________  EKG   ____________________________________________  RADIOLOGY   ____________________________________________   PROCEDURES  Procedure(s) performed: None  Critical Care performed: No  ____________________________________________   INITIAL IMPRESSION / ASSESSMENT AND PLAN / ED COURSE  Pertinent labs & imaging results that were available during my care of the patient were reviewed by me and considered in my medical decision making (see chart for details).  Acute gastroenteritis. Rx given for Phenergan 25 mg and Lomotil. Patient was given IV normal saline 1 L while in the ED with significant improvement. Patient voices no other emergency medical complaints at this time. ____________________________________________   FINAL CLINICAL IMPRESSION(S) / ED DIAGNOSES  Final diagnoses:  Gastroenteritis     This chart was dictated using voice recognition software/Dragon. Despite best efforts to proofread, errors can occur which can change the meaning. Any change was purely unintentional.   Arlyss Repress, PA-C 04/22/16 [place], MD 04/22/16 1450

## 2016-05-01 ENCOUNTER — Encounter: Payer: 59 | Admitting: Unknown Physician Specialty

## 2016-06-09 ENCOUNTER — Ambulatory Visit (INDEPENDENT_AMBULATORY_CARE_PROVIDER_SITE_OTHER): Payer: 59 | Admitting: Unknown Physician Specialty

## 2016-06-09 ENCOUNTER — Encounter: Payer: Self-pay | Admitting: Unknown Physician Specialty

## 2016-06-09 VITALS — BP 116/71 | HR 71 | Temp 98.1°F | Ht 65.5 in | Wt 216.8 lb

## 2016-06-09 DIAGNOSIS — I10 Essential (primary) hypertension: Secondary | ICD-10-CM | POA: Diagnosis not present

## 2016-06-09 DIAGNOSIS — I1310 Hypertensive heart and chronic kidney disease without heart failure, with stage 1 through stage 4 chronic kidney disease, or unspecified chronic kidney disease: Secondary | ICD-10-CM | POA: Insufficient documentation

## 2016-06-09 DIAGNOSIS — F329 Major depressive disorder, single episode, unspecified: Secondary | ICD-10-CM | POA: Insufficient documentation

## 2016-06-09 DIAGNOSIS — F17210 Nicotine dependence, cigarettes, uncomplicated: Secondary | ICD-10-CM | POA: Insufficient documentation

## 2016-06-09 DIAGNOSIS — F32A Depression, unspecified: Secondary | ICD-10-CM

## 2016-06-09 DIAGNOSIS — F17200 Nicotine dependence, unspecified, uncomplicated: Secondary | ICD-10-CM

## 2016-06-09 DIAGNOSIS — E669 Obesity, unspecified: Secondary | ICD-10-CM | POA: Insufficient documentation

## 2016-06-09 DIAGNOSIS — Z87891 Personal history of nicotine dependence: Secondary | ICD-10-CM | POA: Insufficient documentation

## 2016-06-09 DIAGNOSIS — E785 Hyperlipidemia, unspecified: Secondary | ICD-10-CM | POA: Insufficient documentation

## 2016-06-09 DIAGNOSIS — Z0000 Encounter for general adult medical examination without abnormal findings: Secondary | ICD-10-CM | POA: Diagnosis not present

## 2016-06-09 DIAGNOSIS — Z23 Encounter for immunization: Secondary | ICD-10-CM | POA: Diagnosis not present

## 2016-06-09 DIAGNOSIS — N183 Chronic kidney disease, stage 3 unspecified: Secondary | ICD-10-CM | POA: Insufficient documentation

## 2016-06-09 LAB — MICROALBUMIN, URINE WAIVED
Creatinine, Urine Waived: 50 mg/dL (ref 10–300)
MICROALB, UR WAIVED: 10 mg/L (ref 0–19)

## 2016-06-09 MED ORDER — AMLODIPINE BESYLATE 5 MG PO TABS: 5.0000 mg | ORAL_TABLET | Freq: Every day | ORAL | Status: DC

## 2016-06-09 MED ORDER — BENAZEPRIL-HYDROCHLOROTHIAZIDE 20-25 MG PO TABS: 1.0000 | ORAL_TABLET | Freq: Every day | ORAL | Status: DC

## 2016-06-09 MED ORDER — CITALOPRAM HYDROBROMIDE 20 MG PO TABS: 20.0000 mg | ORAL_TABLET | Freq: Every day | ORAL | Status: DC

## 2016-06-09 MED ORDER — ATENOLOL 100 MG PO TABS: 100.0000 mg | ORAL_TABLET | Freq: Every day | ORAL | Status: DC

## 2016-06-09 NOTE — Assessment & Plan Note (Signed)
Stable, continue present medications.   

## 2016-06-09 NOTE — Patient Instructions (Addendum)
Tdap Vaccine (Tetanus, Diphtheria and Pertussis): What You Need to Know 1. Why get vaccinated? Tetanus, diphtheria and pertussis are very serious diseases. Tdap vaccine can protect us from these diseases. And, Tdap vaccine given to pregnant women can protect newborn babies against pertussis. TETANUS (Lockjaw) is rare in the [place] today. It causes painful muscle tightening and stiffness, usually all over the body.  It can lead to tightening of muscles in the head and neck so you can't open your mouth, swallow, or sometimes even breathe. Tetanus kills about 1 out of 10 people who are infected even after receiving the best medical care. DIPHTHERIA is also rare in the [place] today. It can cause a thick coating to form in the back of the throat.  It can lead to breathing problems, heart failure, paralysis, and death. PERTUSSIS (Whooping Cough) causes severe coughing spells, which can cause difficulty breathing, vomiting and disturbed sleep.  It can also lead to weight loss, incontinence, and rib fractures. Up to 2 in 100 adolescents and 5 in 100 adults with pertussis are hospitalized or have complications, which could include pneumonia or death. These diseases are caused by bacteria. Diphtheria and pertussis are spread from person to person through secretions from coughing or sneezing. Tetanus enters the body through cuts, scratches, or wounds. Before vaccines, as many as 200,000 cases of diphtheria, 200,000 cases of pertussis, and hundreds of cases of tetanus, were reported in the [place] each year. Since vaccination began, reports of cases for tetanus and diphtheria have dropped by about 99% and for pertussis by about 80%. 2. Tdap vaccine Tdap vaccine can protect adolescents and adults from tetanus, diphtheria, and pertussis. One dose of Tdap is routinely given at age 11 or 12. People who did not get Tdap at that age should get it as soon as possible. Tdap is especially important  for healthcare professionals and anyone having close contact with a baby younger than 12 months. Pregnant women should get a dose of Tdap during every pregnancy, to protect the newborn from pertussis. Infants are most at risk for severe, life-threatening complications from pertussis. Another vaccine, called Td, protects against tetanus and diphtheria, but not pertussis. A Td booster should be given every 10 years. Tdap may be given as one of these boosters if you have never gotten Tdap before. Tdap may also be given after a severe cut or burn to prevent tetanus infection. Your doctor or the person giving you the vaccine can give you more information. Tdap may safely be given at the same time as other vaccines. 3. Some people should not get this vaccine  A person who has ever had a life-threatening allergic reaction after a previous dose of any diphtheria, tetanus or pertussis containing vaccine, OR has a severe allergy to any part of this vaccine, should not get Tdap vaccine. Tell the person giving the vaccine about any severe allergies.  Anyone who had coma or long repeated seizures within 7 days after a childhood dose of DTP or DTaP, or a previous dose of Tdap, should not get Tdap, unless a cause other than the vaccine was found. They can still get Td.  Talk to your doctor if you:  have seizures or another nervous system problem,  had severe pain or swelling after any vaccine containing diphtheria, tetanus or pertussis,  ever had a condition called Guillain-Barr Syndrome (GBS),  aren't feeling well on the day the shot is scheduled. 4. Risks With any medicine, including vaccines, there is   a chance of side effects. These are usually mild and go away on their own. Serious reactions are also possible but are rare. Most people who get Tdap vaccine do not have any problems with it. Mild problems following Tdap (Did not interfere with activities)  Pain where the shot was given (about 3 in 4  adolescents or 2 in 3 adults)  Redness or swelling where the shot was given (about 1 person in 5)  Mild fever of at least 100.4F (up to about 1 in 25 adolescents or 1 in 100 adults)  Headache (about 3 or 4 people in 10)  Tiredness (about 1 person in 3 or 4)  Nausea, vomiting, diarrhea, stomach ache (up to 1 in 4 adolescents or 1 in 10 adults)  Chills, sore joints (about 1 person in 10)  Body aches (about 1 person in 3 or 4)  Rash, swollen glands (uncommon) Moderate problems following Tdap (Interfered with activities, but did not require medical attention)  Pain where the shot was given (up to 1 in 5 or 6)  Redness or swelling where the shot was given (up to about 1 in 16 adolescents or 1 in 12 adults)  Fever over 102F (about 1 in 100 adolescents or 1 in 250 adults)  Headache (about 1 in 7 adolescents or 1 in 10 adults)  Nausea, vomiting, diarrhea, stomach ache (up to 1 or 3 people in 100)  Swelling of the entire arm where the shot was given (up to about 1 in 500). Severe problems following Tdap (Unable to perform usual activities; required medical attention)  Swelling, severe pain, bleeding and redness in the arm where the shot was given (rare). Problems that could happen after any vaccine:  People sometimes faint after a medical procedure, including vaccination. Sitting or lying down for about 15 minutes can help prevent fainting, and injuries caused by a fall. Tell your doctor if you feel dizzy, or have vision changes or ringing in the ears.  Some people get severe pain in the shoulder and have difficulty moving the arm where a shot was given. This happens very rarely.  Any medication can cause a severe allergic reaction. Such reactions from a vaccine are very rare, estimated at fewer than 1 in a million doses, and would happen within a few minutes to a few hours after the vaccination. As with any medicine, there is a very remote chance of a vaccine causing a serious  injury or death. The safety of vaccines is always being monitored. For more information, visit: www.cdc.gov/vaccinesafety/ 5. What if there is a serious problem? What should I look for?  Look for anything that concerns you, such as signs of a severe allergic reaction, very high fever, or unusual behavior.  Signs of a severe allergic reaction can include hives, swelling of the face and throat, difficulty breathing, a fast heartbeat, dizziness, and weakness. These would usually start a few minutes to a few hours after the vaccination. What should I do?  If you think it is a severe allergic reaction or other emergency that can't wait, call 9-1-1 or get the person to the nearest hospital. Otherwise, call your doctor.  Afterward, the reaction should be reported to the Vaccine Adverse Event Reporting System (VAERS). Your doctor might file this report, or you can do it yourself through the VAERS web site at www.vaers.hhs.gov, or by calling 1-800-822-7967. VAERS does not give medical advice.  6. The National Vaccine Injury Compensation Program The National Vaccine Injury Compensation Program (  VICP) is a federal program that was created to compensate people who may have been injured by certain vaccines. Persons who believe they may have been injured by a vaccine can learn about the program and about filing a claim by calling 938 291 2159 or visiting the [place] website at GoldCloset.com.ee. There is a time limit to file a claim for compensation. 7. How can I learn more?  Ask your doctor. He or she can give you the vaccine package insert or suggest other sources of information.  Call your local or state health department.  Contact the Centers for Disease Control and Prevention (CDC):  Call 240-434-9642 (1-800-CDC-INFO) or  Visit CDC's website at http://hunter.com/ CDC Tdap Vaccine VIS (01/23/14)   This information is not intended to replace advice given to you by your health care  provider. Make sure you discuss any questions you have with your health care provider.  --------------------------------------------------------------------------------- Please do call to schedule your mammogram; the number to schedule one at either [facility] or [facility] is [phone] ----------------------------------------------------------------------------------   Irritable Bowel Syndrome, Adult Irritable bowel syndrome (IBS) is not one specific disease. It is a group of symptoms that affects the organs responsible for digestion (gastrointestinal or GI tract).  To regulate how your GI tract works, your body sends signals back and forth between your intestines and your brain. If you have IBS, there may be a problem with these signals. As a result, your GI tract does not function normally. Your intestines may become more sensitive and overreact to certain things. This is especially true when you eat certain foods or when you are under stress.  There are four types of IBS. These may be determined based on the consistency of your stool:   IBS with diarrhea.   IBS with constipation.   Mixed IBS.   Unsubtyped IBS.  It is important to know which type of IBS you have. Some treatments are more likely to be helpful for certain types of IBS.  CAUSES  The exact cause of IBS is not known. RISK FACTORS You may have a higher risk of IBS if:  You are a woman.  You are younger than 64 years old.  You have a family history of IBS.  You have mental health problems.  You have had bacterial infection of your GI tract. SIGNS AND SYMPTOMS  Symptoms of IBS vary from person to person. The main symptom is abdominal pain or discomfort. Additional symptoms usually include one or more of the following:   Diarrhea, constipation, or both.   Abdominal swelling or bloating.   Feeling full or sick after eating a small or regular-size meal.   Frequent gas.    Mucus in the stool.   A feeling of having more stool left after a bowel movement.  Symptoms tend to come and go. They may be associated with stress, psychiatric conditions, or nothing at all.  DIAGNOSIS  There is no specific test to diagnose IBS. Your health care provider will make a diagnosis based on a physical exam, medical history, and your symptoms. You may have other tests to rule out other conditions that may be causing your symptoms. These may include:   Blood tests.   X-rays.   CT scan.  Endoscopy and colonoscopy. This is a test in which your GI tract is viewed with a long, thin, flexible tube. TREATMENT There is no cure for IBS, but treatment can help relieve symptoms. IBS treatment often includes:   Changes to  your diet, such as:  Eating more fiber.  Avoiding foods that cause symptoms.  Drinking more water.  Eating regular, medium-sized portioned meals.  Medicines. These may include:  Fiber supplements if you have constipation.  Medicine to control diarrhea (antidiarrheal medicines).  Medicine to help control muscle spasms in your GI tract (antispasmodic medicines).  Medicines to help with any mental health issues, such as antidepressants or tranquilizers.  Therapy.  Talk therapy may help with anxiety, depression, or other mental health issues that can make IBS symptoms worse.  Stress reduction.  Managing your stress can help keep symptoms under control. HOME CARE INSTRUCTIONS   Take medicines only as directed by your health care provider.  Eat a healthy diet.  Avoid foods and drinks with added sugar.  Include more whole grains, fruits, and vegetables gradually into your diet. This may be especially helpful if you have IBS with constipation.  Avoid any foods and drinks that make your symptoms worse. These may include dairy products and caffeinated or carbonated drinks.  Do not eat large meals.  Drink enough fluid to keep your urine clear  or pale yellow.  Exercise regularly. Ask your health care provider for recommendations of good activities for you.  Keep all follow-up visits as directed by your health care provider. This is important. SEEK MEDICAL CARE IF:   You have constant pain.  You have trouble or pain with swallowing.  You have worsening diarrhea. SEEK IMMEDIATE MEDICAL CARE IF:   You have severe and worsening abdominal pain.   You have diarrhea and:   You have a rash, stiff neck, or severe headache.   You are irritable, sleepy, or difficult to awaken.   You are weak, dizzy, or extremely thirsty.   You have bright red blood in your stool or you have black tarry stools.   You have unusual abdominal swelling that is painful.   You vomit continuously.   You vomit blood (hematemesis).   You have both abdominal pain and a fever.    This information is not intended to replace advice given to you by your health care provider. Make sure you discuss any questions you have with your health care provider.   Document Released: 11/16/2005 Document Revised: 12/07/2014 Document Reviewed: 08/03/2014 Elsevier Interactive Patient Education 2016 [place].    Diet for Irritable Bowel Syndrome When you have irritable bowel syndrome (IBS), the foods you eat and your eating habits are very important. IBS may cause various symptoms, such as abdominal pain, constipation, or diarrhea. Choosing the right foods can help ease discomfort caused by these symptoms. Work with your health care provider and dietitian to find the best eating plan to help control your symptoms. WHAT GENERAL GUIDELINES DO I NEED TO FOLLOW?  Keep a food diary. This will help you identify foods that cause symptoms. Write down:  What you eat and when.  What symptoms you have.  When symptoms occur in relation to your meals.  Avoid foods that cause symptoms. Talk with your dietitian about other ways to get the same nutrients that  are in these foods.  Eat more foods that contain fiber. Take a fiber supplement if directed by your dietitian.  Eat your meals slowly, in a relaxed setting.  Aim to eat 5-6 small meals per day. Do not skip meals.  Drink enough fluids to keep your urine clear or pale yellow.  Ask your health care provider if you should take an over-the-counter probiotic during flare-ups to help restore  healthy gut bacteria.  If you have cramping or diarrhea, try making your meals low in fat and high in carbohydrates. Examples of carbohydrates are pasta, rice, whole grain breads and cereals, fruits, and vegetables.  If dairy products cause your symptoms to flare up, try eating less of them. You might be able to handle yogurt better than other dairy products because it contains bacteria that help with digestion. WHAT FOODS ARE NOT RECOMMENDED? The following are some foods and drinks that may worsen your symptoms:  Fatty foods, such as [place] fries.  Milk products, such as cheese or ice cream.  Chocolate.  Alcohol.  Products with caffeine, such as coffee.  Carbonated drinks, such as soda. The items listed above may not be a complete list of foods and beverages to avoid. Contact your dietitian for more information. WHAT FOODS ARE GOOD SOURCES OF FIBER? Your health care provider or dietitian may recommend that you eat more foods that contain fiber. Fiber can help reduce constipation and other IBS symptoms. Add foods with fiber to your diet a little at a time so that your body can get used to them. Too much fiber at once might cause gas and swelling of your abdomen. The following are some foods that are good sources of fiber:  Apples.  Peaches.  Pears.  Berries.  Figs.  Broccoli (raw).  Cabbage.  Carrots.  Raw peas.  Kidney beans.  Lima beans.  Whole grain bread.  Whole grain cereal. FOR MORE INFORMATION  International Foundation for Functional Gastrointestinal Disorders:  www.iffgd.[employer] Diabetes and Digestive and Kidney Diseases: NetworkAffair.co.za.aspx   This information is not intended to replace advice given to you by your health care provider. Make sure you discuss any questions you have with your health care provider.   Try the OTC probiotic called Align

## 2016-06-09 NOTE — Progress Notes (Signed)
BP 116/71 mmHg  Pulse 71  Temp(Src) 98.1 F (36.7 C)  Ht 5' 5.5" (1.664 m)  Wt 216 lb 12.8 oz (98.34 kg)  BMI 35.52 kg/m2  SpO2 96%   Subjective:    Patient ID: Sarah [name], female    DOB: [birth year], 65 y.o.   MRN: [id number]  HPI: Sarah [name] is a 65 y.o. female  Chief Complaint  Patient presents with  . Annual Exam   Hypertension Using medications without difficulty Average home BPs not checking  No problems or lightheadedness No chest pain with exertion or shortness of breath No Edema   Hyperlipidemia Using medications without problems: No Muscle aches  Diet compliance: Less fried foods Exercise: at work  Depression screen [facility] 2/9 06/09/2016  Decreased Interest 0  Down, Depressed, Hopeless 0  PHQ - 2 Score 0      Social History   Social History  . Marital Status: Divorced    Spouse Name: N/A  . Number of Children: N/A  . Years of Education: N/A   Occupational History  . Not on file.   Social History Main Topics  . Smoking status: Current Every Day Smoker -- 1.00 packs/day for 40 years    Types: Cigarettes  . Smokeless tobacco: Never Used  . Alcohol Use: Yes     Comment: wine on occasion  . Drug Use: No  . Sexual Activity: Yes   Other Topics Concern  . Not on file   Social History Narrative   Family History  Problem Relation Age of Onset  . Cancer Cousin     maternal first cousin with breast cancer  . Cancer Other     colon and ovarian cancers, no relationship listed  . Cancer Father     prostate  . Hypertension Sister   . Hypertension Brother   . Hypertension Paternal Grandmother   . Heart disease Paternal Grandmother   . Hypertension Paternal Grandfather   . Heart disease Paternal Grandfather    Past Medical History  Diagnosis Date  . Hypertension   . Benign neoplasm of breast 2012  . Personal history of tobacco use, presenting hazards to health   . Abnormal mammogram, unspecified   . Special screening for  malignant neoplasms, colon   . Obesity, unspecified   . Breast complaint 2010  . Aneurysm ([place])     cerebral x  4   Past Surgical History  Procedure Laterality Date  . Tonsillectomy  1997  . Abdominal hysterectomy  1993  . Foot surgery  2003  . Eye surgery    . Colonoscopy  2008    Dr. Nicolasa Ducking  . Cerebral aneurysm repair  2004  . Breast biopsy Right 2012    stereo bx showing a 2mm radial scar in an estimated 8 volume of tissue  . Breast biopsy Right Apr 09, 2005    Fibrocystic changes, ductal adenosis, microcalcifications, focal stromal/epithelial proliferation, possible fibroadenoma type proliferation     Relevant past medical, surgical, family and social history reviewed and updated as indicated. Interim medical history since our last visit reviewed. Allergies and medications reviewed and updated.  Review of Systems  Constitutional: Negative.   HENT: Negative.   Eyes: Negative.   Respiratory: Negative.   Cardiovascular: Negative.   Gastrointestinal: Positive for diarrhea.  Endocrine: Negative.   Genitourinary: Negative.   Musculoskeletal: Negative.   Skin: Negative.   Allergic/Immunologic: Negative.   Neurological: Negative.   Hematological: Negative.   Psychiatric/Behavioral: Negative.  Per HPI unless specifically indicated above     Objective:    BP 116/71 mmHg  Pulse 71  Temp(Src) 98.1 F (36.7 C)  Ht 5' 5.5" (1.664 m)  Wt 216 lb 12.8 oz (98.34 kg)  BMI 35.52 kg/m2  SpO2 96%  Wt Readings from Last 3 Encounters:  06/09/16 216 lb 12.8 oz (98.34 kg)  04/22/16 230 lb (104.327 kg)  01/06/16 218 lb 3.2 oz (98.975 kg)    Physical Exam  Constitutional: She is oriented to person, place, and time. She appears well-developed and well-nourished.  HENT:  Head: Normocephalic and atraumatic.  Eyes: Pupils are equal, round, and reactive to light. Right eye exhibits no discharge. Left eye exhibits no discharge. No scleral icterus.  Neck: Normal range of motion.  Neck supple. Carotid bruit is not present. No thyromegaly present.  Cardiovascular: Normal rate, regular rhythm and normal heart sounds.  Exam reveals no gallop and no friction rub.   No murmur heard. Pulmonary/Chest: Effort normal and breath sounds normal. No respiratory distress. She has no wheezes. She has no rales.  Abdominal: Soft. Bowel sounds are normal. There is no tenderness. There is no rebound.  Genitourinary: No breast swelling, tenderness or discharge.  Musculoskeletal: Normal range of motion.  Lymphadenopathy:    She has no cervical adenopathy.  Neurological: She is alert and oriented to person, place, and time.  Skin: Skin is warm, dry and intact. No rash noted.  Psychiatric: She has a normal mood and affect. Her speech is normal and behavior is normal. Judgment and thought content normal. Cognition and memory are normal.    Results for orders placed or performed during the hospital encounter of 04/22/16  CBC with Differential  Result Value Ref Range   WBC 6.1 3.6 - 11.0 K/uL   RBC 4.73 3.80 - 5.20 MIL/uL   Hemoglobin 13.8 12.0 - 16.0 g/dL   HCT 41.6 35.0 - 47.0 %   MCV 87.9 80.0 - 100.0 fL   MCH 29.2 26.0 - 34.0 pg   MCHC 33.2 32.0 - 36.0 g/dL   RDW 14.2 11.5 - 14.5 %   Platelets 266 150 - 440 K/uL   Neutrophils Relative % 46 %   Neutro Abs 2.8 1.4 - 6.5 K/uL   Lymphocytes Relative 35 %   Lymphs Abs 2.1 1.0 - 3.6 K/uL   Monocytes Relative 16 %   Monocytes Absolute 1.0 (H) 0.2 - 0.9 K/uL   Eosinophils Relative 2 %   Eosinophils Absolute 0.1 0 - 0.7 K/uL   Basophils Relative 1 %   Basophils Absolute 0.0 0 - 0.1 K/uL  Amylase  Result Value Ref Range   Amylase 50 28 - 100 U/L  Lipase, blood  Result Value Ref Range   Lipase 30 11 - 51 U/L  Comprehensive metabolic panel  Result Value Ref Range   Sodium 136 135 - 145 mmol/L   Potassium 3.2 (L) 3.5 - 5.1 mmol/L   Chloride 104 101 - 111 mmol/L   CO2 22 22 - 32 mmol/L   Glucose, Bld 110 (H) 65 - 99 mg/dL   BUN  21 (H) 6 - 20 mg/dL   Creatinine, Ser 1.64 (H) 0.44 - 1.00 mg/dL   Calcium 9.2 8.9 - 10.3 mg/dL   Total Protein 8.2 (H) 6.5 - 8.1 g/dL   Albumin 4.0 3.5 - 5.0 g/dL   AST 25 15 - 41 U/L   ALT 19 14 - 54 U/L   Alkaline Phosphatase 85 38 - 126  U/L   Total Bilirubin 0.4 0.3 - 1.2 mg/dL   GFR calc non Af Amer 32 (L) >60 mL/min   GFR calc Af Amer 37 (L) >60 mL/min   Anion gap 10 5 - 15      Assessment & Plan:   Problem List Items Addressed This Visit      Unprioritized   Depression    Stable, continue present medications.        Relevant Medications   citalopram (CELEXA) 20 MG tablet   Essential hypertension, benign    Stable, continue present medications.        Relevant Medications   benazepril-hydrochlorthiazide (LOTENSIN HCT) 20-25 MG tablet   atenolol (TENORMIN) 100 MG tablet   amLODipine (NORVASC) 5 MG tablet   Other Relevant Orders   Comprehensive metabolic panel   Microalbumin, Urine Waived   Uric acid   Hyperlipidemia    Stable, continue present medications.        Relevant Medications   benazepril-hydrochlorthiazide (LOTENSIN HCT) 20-25 MG tablet   atenolol (TENORMIN) 100 MG tablet   amLODipine (NORVASC) 5 MG tablet   Other Relevant Orders   Lipid Panel w/o Chol/HDL Ratio   Tobacco dependence    Encouraged to quit.  Order low dose chest CT       Other Visit Diagnoses    Health care maintenance    -  Primary    Relevant Orders    Hepatitis C antibody    HIV antibody    MM DIGITAL SCREENING BILATERAL    Need for diphtheria-tetanus-pertussis (Tdap) vaccine, adult/adolescent        Relevant Orders    Tdap vaccine greater than or equal to 7yo IM (Completed)        Follow up plan: Return in about 6 months (around 12/10/2016).

## 2016-06-09 NOTE — Assessment & Plan Note (Signed)
Encouraged to quit.  Order low dose chest CT

## 2016-06-10 ENCOUNTER — Encounter: Payer: Self-pay | Admitting: Unknown Physician Specialty

## 2016-06-10 LAB — URIC ACID: URIC ACID: 6.8 mg/dL (ref 2.5–7.1)

## 2016-06-10 LAB — COMPREHENSIVE METABOLIC PANEL
ALK PHOS: 90 IU/L (ref 39–117)
ALT: 10 IU/L (ref 0–32)
AST: 14 IU/L (ref 0–40)
Albumin/Globulin Ratio: 1.3 (ref 1.2–2.2)
Albumin: 3.7 g/dL (ref 3.6–4.8)
BUN / CREAT RATIO: 18 (ref 12–28)
BUN: 20 mg/dL (ref 8–27)
CHLORIDE: 103 mmol/L (ref 96–106)
CO2: 23 mmol/L (ref 18–29)
Calcium: 9.2 mg/dL (ref 8.7–10.3)
Creatinine, Ser: 1.12 mg/dL — ABNORMAL HIGH (ref 0.57–1.00)
GFR calc Af Amer: 60 mL/min/{1.73_m2} (ref >59)
GFR calc non Af Amer: 52 mL/min/{1.73_m2} — ABNORMAL LOW (ref >59)
Globulin, Total: 2.8 g/dL (ref 1.5–4.5)
Glucose: 93 mg/dL (ref 65–99)
Potassium: 3.6 mmol/L (ref 3.5–5.2)
SODIUM: 143 mmol/L (ref 134–144)
Total Protein: 6.5 g/dL (ref 6.0–8.5)

## 2016-06-10 LAB — LIPID PANEL W/O CHOL/HDL RATIO
Cholesterol, Total: 168 mg/dL (ref 100–199)
HDL: 40 mg/dL (ref >39)
LDL Calculated: 109 mg/dL — ABNORMAL HIGH (ref 0–99)
TRIGLYCERIDES: 94 mg/dL (ref 0–149)
VLDL Cholesterol Cal: 19 mg/dL (ref 5–40)

## 2016-06-10 LAB — HIV ANTIBODY (ROUTINE TESTING W REFLEX): HIV Screen 4th Generation wRfx: NONREACTIVE

## 2016-06-10 LAB — HEPATITIS C ANTIBODY: Hep C Virus Ab: <0.1 s/co ratio (ref 0.0–0.9)

## 2016-07-14 ENCOUNTER — Telehealth: Payer: Self-pay | Admitting: *Deleted

## 2016-07-14 NOTE — Telephone Encounter (Signed)
Received referral for initial lung cancer screening scan. Contacted patient and obtained smoking history,(current smoker, 40pack year ) as well as answering questions related to screening process. Patient denies signs of lung cancer such as weight loss or hemoptysis. Patient denies comorbidity that would prevent curative treatment if lung cancer were found. Patient requests a Wednesday appt, will contact when that can be arranged.

## 2016-08-05 ENCOUNTER — Telehealth: Payer: Self-pay | Admitting: *Deleted

## 2016-08-05 NOTE — Telephone Encounter (Signed)
Received referral for low dose lung cancer screening CT scan. Voicemail left at phone number listed in EMR for patient to call me back to facilitate scheduling scan.  

## 2016-08-11 ENCOUNTER — Telehealth: Payer: Self-pay | Admitting: *Deleted

## 2016-08-11 NOTE — Telephone Encounter (Signed)
Received referral for low dose lung cancer screening CT scan. Voicemail left at phone number listed in EMR for patient to call me back to facilitate scheduling scan.  

## 2016-08-21 ENCOUNTER — Telehealth: Payer: Self-pay | Admitting: *Deleted

## 2016-08-21 NOTE — Telephone Encounter (Signed)
Patient returned call for lung screening scan. See prior note for smoking history and other details. Patient is tentatively scheduled for shared decision making visit and CT scan on October 3rd, pending insurance approval.

## 2016-08-31 ENCOUNTER — Other Ambulatory Visit: Payer: Self-pay | Admitting: *Deleted

## 2016-08-31 DIAGNOSIS — Z87891 Personal history of nicotine dependence: Secondary | ICD-10-CM

## 2016-09-01 ENCOUNTER — Inpatient Hospital Stay: Payer: 59 | Attending: Oncology | Admitting: Oncology

## 2016-09-01 ENCOUNTER — Ambulatory Visit
Admission: RE | Admit: 2016-09-01 | Discharge: 2016-09-01 | Disposition: A | Discharge status: Home / Self Care | Payer: 59 | Source: Ambulatory Visit | Attending: Oncology | Admitting: Oncology

## 2016-09-01 ENCOUNTER — Ambulatory Visit: Payer: Self-pay | Admitting: Oncology

## 2016-09-01 DIAGNOSIS — I700 Atherosclerosis of aorta: Secondary | ICD-10-CM | POA: Diagnosis not present

## 2016-09-01 DIAGNOSIS — I2510 Atherosclerotic heart disease of native coronary artery without angina pectoris: Secondary | ICD-10-CM | POA: Diagnosis not present

## 2016-09-01 DIAGNOSIS — F17210 Nicotine dependence, cigarettes, uncomplicated: Secondary | ICD-10-CM | POA: Diagnosis not present

## 2016-09-01 DIAGNOSIS — Z122 Encounter for screening for malignant neoplasm of respiratory organs: Secondary | ICD-10-CM

## 2016-09-01 DIAGNOSIS — N2889 Other specified disorders of kidney and ureter: Secondary | ICD-10-CM | POA: Insufficient documentation

## 2016-09-01 DIAGNOSIS — I288 Other diseases of pulmonary vessels: Secondary | ICD-10-CM | POA: Diagnosis not present

## 2016-09-01 DIAGNOSIS — Z87891 Personal history of nicotine dependence: Secondary | ICD-10-CM | POA: Insufficient documentation

## 2016-09-01 NOTE — Progress Notes (Signed)
In accordance with CMS guidelines, patient has met eligibility criteria including age, absence of signs or symptoms of lung cancer.  Social History  Substance Use Topics  . Smoking status: Current Every Day Smoker    Packs/day: 1.00    Years: 40.00    Types: Cigarettes  . Smokeless tobacco: Never Used  . Alcohol use Yes     Comment: wine on occasion     A shared decision-making session was conducted prior to the performance of CT scan. This includes one or more decision aids, includes benefits and harms of screening, follow-up diagnostic testing, over-diagnosis, false positive rate, and total radiation exposure.  Counseling on the importance of adherence to annual lung cancer LDCT screening, impact of co-morbidities, and ability or willingness to undergo diagnosis and treatment is imperative for compliance of the program.  Counseling on the importance of continued smoking cessation for former smokers; the importance of smoking cessation for current smokers, and information about tobacco cessation interventions have been given to patient including [place] and [facility] quit [place] programs.  Written order for lung cancer screening with LDCT has been given to the patient and any and all questions have been answered to the best of my abilities.   Yearly follow up will be coordinated by Burgess Estelle, Thoracic Navigator.

## 2016-09-03 ENCOUNTER — Telehealth: Payer: Self-pay | Admitting: *Deleted

## 2016-09-03 NOTE — Telephone Encounter (Signed)
Notified patient of LDCT lung cancer screening results with recommendation for 12 month follow up imaging. Also notified of incidental finding noted below. Patient encouraged to discuss incidental findings with PCP and is aware that PCP may order further testing to evaluate the renal abnormality seen. Patient verbalizes understanding. This note will be forwarded to PCP.  IMPRESSION: 1. Lung-RADS Category 2S, benign appearance or behavior. Continue annual screening with low-dose chest CT without contrast in 12 months. 2. The "S" modifier above refers to potentially clinically significant non lung cancer related findings. Specifically, two-vessel coronary atherosclerosis. 3. **An incidental finding of potential clinical significance has been found. Indeterminate 2.7 cm renal cortical mass in the posterior upper right kidney. Indeterminate right adrenal 2.2 cm nodule. Recommend further evaluation of both of these findings with MRI abdomen without and with IV contrast. This recommendation follows ACR consensus guidelines: Management of the Incidental Renal Mass on CT: A White Paper of the ACR Incidental Findings Committee. J Am Coll Radiol 2017; article in press.** 4. Aortic atherosclerosis. 5. Dilated main pulmonary artery, suggesting pulmonary arterial hypertension.

## 2016-09-04 ENCOUNTER — Other Ambulatory Visit: Payer: Self-pay | Admitting: Unknown Physician Specialty

## 2016-09-04 DIAGNOSIS — N2889 Other specified disorders of kidney and ureter: Secondary | ICD-10-CM

## 2016-09-07 ENCOUNTER — Telehealth: Payer: Self-pay

## 2016-09-07 ENCOUNTER — Other Ambulatory Visit: Payer: Self-pay | Admitting: Internal Medicine

## 2016-09-07 DIAGNOSIS — N2889 Other specified disorders of kidney and ureter: Secondary | ICD-10-CM

## 2016-09-07 NOTE — Telephone Encounter (Signed)
MRI order needs to be changed to MRI with and without; IMG 321.  Malachy Mood can you please cancel the old orders and place the new order.

## 2016-09-10 ENCOUNTER — Telehealth: Payer: Self-pay | Admitting: Unknown Physician Specialty

## 2016-09-10 NOTE — Telephone Encounter (Signed)
MRI Request. patient called stating that she would have to pay 3,000$ for an MRi because shae hasn't met her deductible she wants to know if Malachy Mood can make her a referral for one in [place]. MRI Request. patient called stating that she would have to pay 3,000$ for an MRi because shae hasn't met her deductible she wants to know if Malachy Mood can make her a referral for one in [place]. Messaged was routed to [place].

## 2016-09-10 NOTE — Telephone Encounter (Signed)
I don't know why she's got a price yet, it's pending authorization. If she wants to go to [place] too that's fine, but I won't be able to work on it till tomorrow and [place] may take a little bit longer. I have to enter it in [facility] .

## 2016-09-10 NOTE — Telephone Encounter (Signed)
Called and left patient a VM asking for her to please return my call.  

## 2016-09-10 NOTE — Telephone Encounter (Signed)
Kristin Bruins, is there any way we could change this for the patient?

## 2016-09-11 NOTE — Telephone Encounter (Addendum)
MRI referral entered in [facility].  Canceling appointment with [facility].

## 2016-09-11 NOTE — Telephone Encounter (Signed)
Patient returned call. She stated that someone from the hospital had already called her and said they talked with her insurance and that the MRI would be $3,000. Patient states she would like to have MRI appointment with [facility] instead.

## 2016-09-14 ENCOUNTER — Telehealth: Payer: Self-pay | Admitting: Unknown Physician Specialty

## 2016-09-14 NOTE — Telephone Encounter (Signed)
[place] with [employer] called to find out if the patient's MRI was authorized by her insurance company?  Please call to advise.

## 2016-09-14 NOTE — Telephone Encounter (Signed)
Authorization was for [facility] not [facility].   Need to get reauthorized with [facility]. Will try to start that today.

## 2016-09-21 NOTE — Telephone Encounter (Signed)
Pt called and stated that [facility] called and left a message that her her MRI was scheduled for 8:30 tomorrow morning but she was suppose to be going to [facility] and is now unsure of what's going on.

## 2016-09-22 ENCOUNTER — Ambulatory Visit: Payer: 59

## 2016-10-01 ENCOUNTER — Telehealth: Payer: Self-pay

## 2016-10-01 DIAGNOSIS — I10 Essential (primary) hypertension: Secondary | ICD-10-CM

## 2016-10-01 NOTE — Telephone Encounter (Signed)
Patient is having an MRI tomorrow, they need an order to draw a Bun/Creat before she has it done.  Patient will be having it done at [facility] outpatient imaging.

## 2016-10-01 NOTE — Telephone Encounter (Signed)
They need the prior auth for her MRI completed asap thru the [facility] site. She has her MRI scheduled for tomorrow at 9:45am.

## 2016-10-02 ENCOUNTER — Ambulatory Visit: Admission: RE | Admit: 2016-10-02 | Payer: 59 | Source: Ambulatory Visit

## 2016-10-02 NOTE — Telephone Encounter (Signed)
Done

## 2016-10-07 ENCOUNTER — Ambulatory Visit (INDEPENDENT_AMBULATORY_CARE_PROVIDER_SITE_OTHER): Payer: 59 | Admitting: Unknown Physician Specialty

## 2016-10-07 ENCOUNTER — Encounter: Payer: Self-pay | Admitting: Unknown Physician Specialty

## 2016-10-07 DIAGNOSIS — M545 Low back pain, unspecified: Secondary | ICD-10-CM

## 2016-10-07 NOTE — Assessment & Plan Note (Signed)
Refusing PT for now.  Continue Tylenol.  Exercises given

## 2016-10-07 NOTE — Patient Instructions (Addendum)
Back Exercises The following exercises strengthen the muscles that help to support the back. They also help to keep the lower back flexible. Doing these exercises can help to prevent back pain or lessen existing pain. If you have back pain or discomfort, try doing these exercises 2-3 times each day or as told by your health care provider. When the pain goes away, do them once each day, but increase the number of times that you repeat the steps for each exercise (do more repetitions). If you do not have back pain or discomfort, do these exercises once each day or as told by your health care provider. EXERCISES Single Knee to Chest Repeat these steps 3-5 times for each leg: 1. Lie on your back on a firm bed or the floor with your legs extended. 2. Bring one knee to your chest. Your other leg should stay extended and in contact with the floor. 3. Hold your knee in place by grabbing your knee or thigh. 4. Pull on your knee until you feel a gentle stretch in your lower back. 5. Hold the stretch for 10-30 seconds. 6. Slowly release and straighten your leg. Pelvic Tilt Repeat these steps 5-10 times: 1. Lie on your back on a firm bed or the floor with your legs extended. 2. Bend your knees so they are pointing toward the ceiling and your feet are flat on the floor. 3. Tighten your lower abdominal muscles to press your lower back against the floor. This motion will tilt your pelvis so your tailbone points up toward the ceiling instead of pointing to your feet or the floor. 4. With gentle tension and even breathing, hold this position for 5-10 seconds. Cat-Cow Repeat these steps until your lower back becomes more flexible: 1. Get into a hands-and-knees position on a firm surface. Keep your hands under your shoulders, and keep your knees under your hips. You may place padding under your knees for comfort. 2. Let your head hang down, and point your tailbone toward the floor so your lower back becomes  rounded like the back of a cat. 3. Hold this position for 5 seconds. 4. Slowly lift your head and point your tailbone up toward the ceiling so your back forms a sagging arch like the back of a cow. 5. Hold this position for 5 seconds. Press-Ups Repeat these steps 5-10 times: 1. Lie on your abdomen (face-down) on the floor. 2. Place your palms near your head, about shoulder-width apart. 3. While you keep your back as relaxed as possible and keep your hips on the floor, slowly straighten your arms to raise the top half of your body and lift your shoulders. Do not use your back muscles to raise your upper torso. You may adjust the placement of your hands to make yourself more comfortable. 4. Hold this position for 5 seconds while you keep your back relaxed. 5. Slowly return to lying flat on the floor. Bridges Repeat these steps 10 times: 1. Lie on your back on a firm surface. 2. Bend your knees so they are pointing toward the ceiling and your feet are flat on the floor. 3. Tighten your buttocks muscles and lift your buttocks off of the floor until your waist is at almost the same height as your knees. You should feel the muscles working in your buttocks and the back of your thighs. If you do not feel these muscles, slide your feet 1-2 inches farther away from your buttocks. 4. Hold this position for 3-5   seconds. 5. Slowly lower your hips to the starting position, and allow your buttocks muscles to relax completely. If this exercise is too easy, try doing it with your arms crossed over your chest. Abdominal Crunches Repeat these steps 5-10 times: 1. Lie on your back on a firm bed or the floor with your legs extended. 2. Bend your knees so they are pointing toward the ceiling and your feet are flat on the floor. 3. Cross your arms over your chest. 4. Tip your chin slightly toward your chest without bending your neck. 5. Tighten your abdominal muscles and slowly raise your trunk (torso) high  enough to lift your shoulder blades a tiny bit off of the floor. Avoid raising your torso higher than that, because it can put too much stress on your low back and it does not help to strengthen your abdominal muscles. 6. Slowly return to your starting position. Back Lifts Repeat these steps 5-10 times: 1. Lie on your abdomen (face-down) with your arms at your sides, and rest your forehead on the floor. 2. Tighten the muscles in your legs and your buttocks. 3. Slowly lift your chest off of the floor while you keep your hips pressed to the floor. Keep the back of your head in line with the curve in your back. Your eyes should be looking at the floor. 4. Hold this position for 3-5 seconds. 5. Slowly return to your starting position. SEEK MEDICAL CARE IF:  Your back pain or discomfort gets much worse when you do an exercise.  Your back pain or discomfort does not lessen within 2 hours after you exercise. If you have any of these problems, stop doing these exercises right away. Do not do them again unless your health care provider says that you can. SEEK IMMEDIATE MEDICAL CARE IF:  You develop sudden, severe back pain. If this happens, stop doing the exercises right away. Do not do them again unless your health care provider says that you can.   This information is not intended to replace advice given to you by your health care provider. Make sure you discuss any questions you have with your health care provider.   Document Released: 12/24/2004 Document Revised: 08/07/2015 Document Reviewed: 01/10/2015 Elsevier Interactive Patient Education 2016 Elsevier Inc. Back Pain, Adult Back pain is very common in adults.The cause of back pain is rarely dangerous and the pain often gets better over time.The cause of your back pain may not be known. Some common causes of back pain include:  Strain of the muscles or ligaments supporting the spine.  Wear and tear (degeneration) of the spinal  disks.  Arthritis.  Direct injury to the back. For many people, back pain may return. Since back pain is rarely dangerous, most people can learn to manage this condition on their own. HOME CARE INSTRUCTIONS Watch your back pain for any changes. The following actions may help to lessen any discomfort you are feeling:  Remain active. It is stressful on your back to sit or stand in one place for long periods of time. Do not sit, drive, or stand in one place for more than 30 minutes at a time. Take short walks on even surfaces as soon as you are able.Try to increase the length of time you walk each day.  Exercise regularly as directed by your health care provider. Exercise helps your back heal faster. It also helps avoid future injury by keeping your muscles strong and flexible.  Do not stay in bed.Resting   more than 1-2 days can delay your recovery.  Pay attention to your body when you bend and lift. The most comfortable positions are those that put less stress on your recovering back. Always use proper lifting techniques, including:  Bending your knees.  Keeping the load close to your body.  Avoiding twisting.  Find a comfortable position to sleep. Use a firm mattress and lie on your side with your knees slightly bent. If you lie on your back, put a pillow under your knees.  Avoid feeling anxious or stressed.Stress increases muscle tension and can worsen back pain.It is important to recognize when you are anxious or stressed and learn ways to manage it, such as with exercise.  Take medicines only as directed by your health care provider. Over-the-counter medicines to reduce pain and inflammation are often the most helpful.Your health care provider may prescribe muscle relaxant drugs.These medicines help dull your pain so you can more quickly return to your normal activities and healthy exercise.  Apply ice to the injured area:  Put ice in a plastic bag.  Place a towel between your  skin and the bag.  Leave the ice on for 20 minutes, 2-3 times a day for the first 2-3 days. After that, ice and heat may be alternated to reduce pain and spasms.  Maintain a healthy weight. Excess weight puts extra stress on your back and makes it difficult to maintain good posture. SEEK MEDICAL CARE IF:  You have pain that is not relieved with rest or medicine.  You have increasing pain going down into the legs or buttocks.  You have pain that does not improve in one week.  You have night pain.  You lose weight.  You have a fever or chills. SEEK IMMEDIATE MEDICAL CARE IF:   You develop new bowel or bladder control problems.  You have unusual weakness or numbness in your arms or legs.  You develop nausea or vomiting.  You develop abdominal pain.  You feel faint.   This information is not intended to replace advice given to you by your health care provider. Make sure you discuss any questions you have with your health care provider.   Document Released: 11/16/2005 Document Revised: 12/07/2014 Document Reviewed: 03/20/2014 Elsevier Interactive Patient Education 2016 Elsevier Inc.  

## 2016-10-07 NOTE — Progress Notes (Signed)
BP 124/72 (BP Location: Left Arm, Patient Position: Sitting, Cuff Size: Large)   Pulse 69   Temp 98.2 F (36.8 C)   Ht 5' 5.7" (1.669 m) Comment: pt had shoes on  Wt 216 lb 9.6 oz (98.2 kg) Comment: pt had shoes on  SpO2 97%   BMI 35.28 kg/m    Subjective:    Patient ID: Sarah [name], female    DOB: 15-Mar-1951, 65 y.o.   MRN: [id number]  HPI: Sarah [name] is a 65 y.o. female  Chief Complaint  Patient presents with  . Back Pain    pt states she is having a MRI on November 16th for a spot on her right kidney. Pt thinks this spot is causing her to have back pain on the right side.     Pt states she is having right sided low back pain on her right side for about 2-3 weeks.  She is taking Tylenol which seems to help until it wears off.  No problems with bowel or bladder.  Worse with sitting for a period of time or standing.  No pain down leg  Relevant past medical, surgical, family and social history reviewed and updated as indicated. Interim medical history since our last visit reviewed. --Allergies and medications reviewed and updated.  Review of Systems  Per HPI unless specifically indicated above     Objective:    BP 124/72 (BP Location: Left Arm, Patient Position: Sitting, Cuff Size: Large)   Pulse 69   Temp 98.2 F (36.8 C)   Ht 5' 5.7" (1.669 m) Comment: pt had shoes on  Wt 216 lb 9.6 oz (98.2 kg) Comment: pt had shoes on  SpO2 97%   BMI 35.28 kg/m   Wt Readings from Last 3 Encounters:  10/07/16 216 lb 9.6 oz (98.2 kg)  09/01/16 217 lb (98.4 kg)  06/09/16 216 lb 12.8 oz (98.3 kg)    Physical Exam  Constitutional: She is oriented to person, place, and time. She appears well-developed and well-nourished. No distress.  HENT:  Head: Normocephalic and atraumatic.  Eyes: Conjunctivae and lids are normal. Right eye exhibits no discharge. Left eye exhibits no discharge. No scleral icterus.  Neck: Normal range of motion. Neck supple. No JVD present. Carotid  bruit is not present.  Abdominal: Normal appearance. There is no splenomegaly or hepatomegaly.  Musculoskeletal:       Lumbar back: She exhibits decreased range of motion. She exhibits no tenderness, no bony tenderness, no swelling, no edema, no deformity, no laceration, no pain, no spasm and normal pulse.  Very decreased ROM  Neurological: She is alert and oriented to person, place, and time.  Skin: Skin is warm, dry and intact. No rash noted. No pallor.  Psychiatric: She has a normal mood and affect. Her behavior is normal. Judgment and thought content normal.    Results for orders placed or performed in visit on 06/09/16  Hepatitis C antibody  Result Value Ref Range   Hep C Virus Ab <0.1 0.0 - 0.9 s/co ratio  HIV antibody  Result Value Ref Range   HIV Screen 4th Generation wRfx Non Reactive Non Reactive  Lipid Panel w/o Chol/HDL Ratio  Result Value Ref Range   Cholesterol, Total 168 100 - 199 mg/dL   Triglycerides 94 0 - 149 mg/dL   HDL 40 >39 mg/dL   VLDL Cholesterol Cal 19 5 - 40 mg/dL   LDL Calculated 109 (H) 0 - 99 mg/dL  Comprehensive metabolic panel  Result Value Ref Range   Glucose 93 65 - 99 mg/dL   BUN 20 8 - 27 mg/dL   Creatinine, Ser 1.12 (H) 0.57 - 1.00 mg/dL   GFR calc non Af Amer 52 (L) >59 mL/min/1.73   GFR calc Af Amer 60 >59 mL/min/1.73   BUN/Creatinine Ratio 18 12 - 28   Sodium 143 134 - 144 mmol/L   Potassium 3.6 3.5 - 5.2 mmol/L   Chloride 103 96 - 106 mmol/L   CO2 23 18 - 29 mmol/L   Calcium 9.2 8.7 - 10.3 mg/dL   Total Protein 6.5 6.0 - 8.5 g/dL   Albumin 3.7 3.6 - 4.8 g/dL   Globulin, Total 2.8 1.5 - 4.5 g/dL   Albumin/Globulin Ratio 1.3 1.2 - 2.2   Bilirubin Total <0.2 0.0 - 1.2 mg/dL   Alkaline Phosphatase 90 39 - 117 IU/L   AST 14 0 - 40 IU/L   ALT 10 0 - 32 IU/L  Microalbumin, Urine Waived  Result Value Ref Range   Microalb, Ur Waived 10 0 - 19 mg/L   Creatinine, Urine Waived 50 10 - 300 mg/dL   Microalb/Creat Ratio <30 <30 mg/g  Uric  acid  Result Value Ref Range   Uric Acid 6.8 2.5 - 7.1 mg/dL      Assessment & Plan:   Problem List Items Addressed This Visit      Unprioritized   Low back pain    Refusing PT for now.  Continue Tylenol.  Exercises given          Follow up plan: Return if symptoms worsen or fail to improve.

## 2016-10-14 ENCOUNTER — Telehealth: Payer: Self-pay | Admitting: Unknown Physician Specialty

## 2016-10-14 ENCOUNTER — Other Ambulatory Visit: Payer: Self-pay | Admitting: Unknown Physician Specialty

## 2016-10-14 DIAGNOSIS — Z5181 Encounter for therapeutic drug level monitoring: Secondary | ICD-10-CM

## 2016-10-14 DIAGNOSIS — N2889 Other specified disorders of kidney and ureter: Secondary | ICD-10-CM

## 2016-10-14 NOTE — Telephone Encounter (Signed)
[facility] called stated they need an order placed for this pt's MRI. Please send order via EPIC. Thanks.

## 2016-10-15 ENCOUNTER — Ambulatory Visit
Admission: RE | Admit: 2016-10-15 | Discharge: 2016-10-15 | Disposition: A | Discharge status: Home / Self Care | Payer: Commercial Managed Care - HMO | Source: Ambulatory Visit | Attending: Unknown Physician Specialty | Admitting: Unknown Physician Specialty

## 2016-10-15 ENCOUNTER — Other Ambulatory Visit
Admission: RE | Admit: 2016-10-15 | Discharge: 2016-10-15 | Disposition: A | Discharge status: Home / Self Care | Payer: 59 | Source: Ambulatory Visit | Attending: Unknown Physician Specialty | Admitting: Unknown Physician Specialty

## 2016-10-15 DIAGNOSIS — N2889 Other specified disorders of kidney and ureter: Secondary | ICD-10-CM | POA: Diagnosis present

## 2016-10-15 DIAGNOSIS — N281 Cyst of kidney, acquired: Secondary | ICD-10-CM | POA: Insufficient documentation

## 2016-10-15 DIAGNOSIS — Z5181 Encounter for therapeutic drug level monitoring: Secondary | ICD-10-CM | POA: Diagnosis not present

## 2016-10-15 LAB — CREATININE, SERUM
Creatinine, Ser: 1.05 mg/dL — ABNORMAL HIGH (ref 0.44–1.00)
GFR calc non Af Amer: 55 mL/min — ABNORMAL LOW (ref >60)

## 2016-10-15 MED ORDER — GADOBENATE DIMEGLUMINE 529 MG/ML IV SOLN
20.0000 mL | Freq: Once | INTRAVENOUS | Status: AC | PRN
  Administered 2016-10-15: 20 mL via INTRAVENOUS

## 2016-10-16 NOTE — Telephone Encounter (Signed)
Olivia Mackie from [facility] called to confirm that we got this patient's MRI results. I looked under imaging and told Olivia Mackie that we did. She stated that she just wanted to make sure we got it. Will route to provider FYI.

## 2016-10-20 NOTE — Progress Notes (Signed)
She needs Urology consult. I will follow if local referral. Thank you.

## 2016-10-20 NOTE — Telephone Encounter (Signed)
Discussed with pt MRI shows renal mass.  Refer to Urology.

## 2016-10-29 ENCOUNTER — Ambulatory Visit: Payer: 59 | Admitting: Urology

## 2016-10-29 ENCOUNTER — Encounter: Payer: Self-pay | Admitting: Urology

## 2016-10-29 VITALS — BP 129/75 | HR 83 | Ht 65.0 in | Wt 221.0 lb

## 2016-10-29 DIAGNOSIS — N281 Cyst of kidney, acquired: Secondary | ICD-10-CM | POA: Diagnosis not present

## 2016-10-29 NOTE — Addendum Note (Signed)
Addended by: Kerry Hough on: 10/29/2016 02:23 PM   Modules accepted: Orders

## 2016-10-29 NOTE — Progress Notes (Signed)
10/29/2016 2:06 PM   [name] [birth year] [id number]  Referring provider: Kathrine Haddock, NP [place], [place] [address]  Chief Complaint  Patient presents with  . New Patient (Initial Visit)    Renal Mass    HPI: The patient is a 65 year old female who presents today for evaluation of a Bosniak 3 left renal cyst. This was found after an incidental finding of a CT of her chest revealed an indeterminate renal lesion.   On review of the imaging, the patient has a medial left Bosniak III renal cyst in very close proximity to the hilum. She also has a lower pole right Bosniak IIF cyst. There is also a Bosniak II right renal cyst and bilateral Bosniak I renal cysts. The patient has no other urological history. She voids without difficulty. She has history of UTI, nephrolithiasis, and gross hematuria.      PMH: Past Medical History:  Diagnosis Date  . Abnormal mammogram, unspecified   . Aneurysm ([place])    cerebral x  4  . Benign neoplasm of breast 2012  . Breast complaint 2010  . Hypertension   . Obesity, unspecified   . Personal history of tobacco use, presenting hazards to health   . Special screening for malignant neoplasms, colon     Surgical History: Past Surgical History:  Procedure Laterality Date  . ABDOMINAL HYSTERECTOMY  1993  . BREAST BIOPSY Right 2012   stereo bx showing a 10mm radial scar in an estimated 8 volume of tissue  . BREAST BIOPSY Right Apr 09, 2005   Fibrocystic changes, ductal adenosis, microcalcifications, focal stromal/epithelial proliferation, possible fibroadenoma type proliferation  . CEREBRAL ANEURYSM REPAIR  2004  . COLONOSCOPY  2008   Dr. Nicolasa Ducking  . EYE SURGERY    . FOOT SURGERY  2003  . TONSILLECTOMY  1997    Home Medications:    Medication List       Accurate as of 10/29/16  2:06 PM. Always use your most recent med list.          amLODipine 5 MG tablet Commonly known as:  NORVASC Take 1 tablet (5 mg total) by mouth  daily.   aspirin 81 MG tablet Take 81 mg by mouth daily.   atenolol 100 MG tablet Commonly known as:  TENORMIN Take 1 tablet (100 mg total) by mouth daily.   benazepril-hydrochlorthiazide 20-25 MG tablet Commonly known as:  LOTENSIN HCT Take 1 tablet by mouth daily.   citalopram 20 MG tablet Commonly known as:  CELEXA Take 1 tablet (20 mg total) by mouth at bedtime.   ibuprofen 800 MG tablet Commonly known as:  ADVIL,MOTRIN Take 1 tablet (800 mg total) by mouth every 8 (eight) hours as needed.   multivitamin tablet Take 1 tablet by mouth daily.   VITAMIN D-3 PO Take 600 Units by mouth daily.       Allergies:  Allergies  Allergen Reactions  . Codeine Itching    Family History: Family History  Problem Relation Age of Onset  . Cancer Cousin     maternal first cousin with breast cancer  . Cancer Other     colon and ovarian cancers, no relationship listed  . Cancer Father     prostate  . Hypertension Sister   . Hypertension Brother   . Hypertension Paternal Grandmother   . Heart disease Paternal Grandmother   . Hypertension Paternal Grandfather   . Heart disease Paternal Grandfather     Social History:  reports  that she has been smoking Cigarettes.  She has a 30.00 pack-year smoking history. She has never used smokeless tobacco. She reports that she drinks alcohol. She reports that she does not use drugs.  ROS: UROLOGY Frequent Urination?: No Hard to postpone urination?: No Burning/pain with urination?: No Get up at night to urinate?: No Leakage of urine?: No Urine stream starts and stops?: No Trouble starting stream?: No Do you have to strain to urinate?: No Blood in urine?: No Urinary tract infection?: No Sexually transmitted disease?: No Injury to kidneys or bladder?: No Painful intercourse?: No Weak stream?: No Currently pregnant?: No Vaginal bleeding?: No Last menstrual period?: n  Gastrointestinal Nausea?: No Vomiting?:  No Indigestion/heartburn?: No Diarrhea?: No Constipation?: No  Constitutional Fever: No Night sweats?: Yes Weight loss?: No Fatigue?: Yes  Skin Skin rash/lesions?: No Itching?: No  Eyes Blurred vision?: No Double vision?: No  Ears/Nose/Throat Sore throat?: No Sinus problems?: Yes  Hematologic/Lymphatic Swollen glands?: No Easy bruising?: No  Cardiovascular Leg swelling?: No Chest pain?: No  Respiratory Cough?: No Shortness of breath?: No  Endocrine Excessive thirst?: Yes  Musculoskeletal Back pain?: Yes Joint pain?: No  Neurological Headaches?: No Dizziness?: No  Psychologic Depression?: No Anxiety?: No  Physical Exam: BP 129/75   Pulse 83   Ht 5\' 5"  (1.651 m)   Wt 221 lb (100.2 kg)   BMI 36.78 kg/m   Constitutional:  Alert and oriented, No acute distress. HEENT:  AT, moist mucus membranes.  Trachea midline, no masses. Cardiovascular: No clubbing, cyanosis, or edema. Respiratory: Normal respiratory effort, no increased work of breathing. GI: Abdomen is soft, nontender, nondistended, no abdominal masses GU: No CVA tenderness.  Skin: No rashes, bruises or suspicious lesions. Lymph: No cervical or inguinal adenopathy. Neurologic: Grossly intact, no focal deficits, moving all 4 extremities. Psychiatric: Normal mood and affect.  Laboratory Data: Lab Results  Component Value Date   WBC 6.1 04/22/2016   HGB 13.8 04/22/2016   HCT 41.6 04/22/2016   MCV 87.9 04/22/2016   PLT 266 04/22/2016    Lab Results  Component Value Date   CREATININE 1.05 (H) 10/15/2016    No results found for: PSA  No results found for: TESTOSTERONE  No results found for: HGBA1C  Urinalysis No results found for: COLORURINE, APPEARANCEUR, LABSPEC, PHURINE, GLUCOSEU, HGBUR, BILIRUBINUR, KETONESUR, PROTEINUR, UROBILINOGEN, NITRITE, LEUKOCYTESUR  Pertinent Imaging: CLINICAL DATA:  Indeterminate RIGHT renal lesion on CT. MRI recommended for further  characterization.  EXAM: MRI ABDOMEN WITHOUT AND WITH CONTRAST  TECHNIQUE: Multiplanar multisequence MR imaging of the abdomen was performed both before and after the administration of intravenous contrast.  CONTRAST:  60mL MULTIHANCE GADOBENATE DIMEGLUMINE 529 MG/ML IV SOLN  COMPARISON:  CT 09/01/2016  FINDINGS: Lower chest:  Lung bases are clear.  Hepatobiliary: No focal hepatic lesion. No duct dilatation. Large smooth oval 2.6 cm gallstone  Pancreas: Normal pancreatic parenchymal intensity. No ductal dilatation or inflammation.  Spleen: Normal spleen.  Adrenals/urinary tract: Enlargement of the RIGHT adrenal gland to 2.6 cm. Lesion has loss of signal intensity on opposed phase imaging (series 4) consistent with benign adenoma.  In the cortex of the RIGHT kidney there is a partially exophytic round lesion measuring 3.0 cm (image 30, series 1)3. This lesion has intermediate signal intensity on T1 weighted imaging and no post-contrast enhancement (series 8 and series 13).  There is a more complex cystic lesion the cortex of the mid to lower RIGHT kidney measuring 3.0 cm in craniocaudad dimension (image 37, series 17). This  cystic lesion is multi lobulated with intervening septations. Lesion is hyperintense on T2 weighted imaging and mixed on T1 weighted imaging.  In the medial aspect of the midl LEFT kidney, 2.5 cm lesion (image 18, series 2) which is hyperintense on T2 weighted imaging. This lesion demonstrates post-contrast enhancement of thickened internal septation (image 40, series 13).  Additional bilateral Bosniak 1 nonenhancing cysts.  Stomach/Bowel: Stomach and limited of the small bowel is unremarkable  Vascular/Lymphatic: Abdominal aortic normal caliber. No retroperitoneal periportal lymphadenopathy.  Musculoskeletal: No aggressive osseous lesion  IMPRESSION: 1. Indeterminate lesion in the upper pole of the RIGHT kidney identified on  comparison CT is categorized as a Bosniak 2 nonenhancing renal cyst. 2. More complex cystic lesion in the lower pole of the RIGHT kidney has multiple septation and categorized as Bosniak IIF cystic renal lesion. 3. Within the medial LEFT kidney, more complex lesion with thick and enhancing septation is categorized as BOSNIAK III CYSTIC RENAL LESION. 4. Additional Bosniak 1 renal cysts throughout the kidneys. 5. Benign RIGHT adrenal adenoma 6. RECOMMEND UROLOGY CONSULTATION.  Assessment & Plan:    1. Left Bosniak III renal cyst I had a long discussion with the patient regarding the implications of her Bosniak III renal cyst. We discussed that there is a 50% chance that this could be a malignancy. Our surgical approach is complicated by the fact of its very close proximity to the hilum. Though it is small in size at only 2.7 cm there is a very high likelihood that she would end up with a radical nephrectomy if we attempted a robotic partial nephrectomy. This would be unfortunate given that she has a 50% chance of having benign pathology at this time. We discussed treatment options as stated above which include left partial nephrectomy, left radical nephrectomy, and biopsy by interventional radiology. I think given the tumor's location and a high likelihood that she would end up with a nephrectomy for potentially benign pathology that she would best served by undergoing a biopsy by interventional radiology before any surgical decision is made. If her biopsy is positive, the risk of a radical nephrectomy is more reasonable to take. If it is benign, she will need repeat imaging to follow it as well as to follow her below mentioned right Bosniak IIF renal cyst.   2. Right Bosniak IIF renal cyst -will need to follow via imaging  3. Bosniak I and II renal cysts -Benign. No further work up needed  Return for after biopsy by IR to discuss pathology.  Nickie Retort, MD  [facility] [address], [place], [place] [address] [phone]

## 2016-11-06 ENCOUNTER — Other Ambulatory Visit: Payer: Self-pay | Admitting: Radiology

## 2016-11-06 ENCOUNTER — Other Ambulatory Visit: Payer: Self-pay | Admitting: General Surgery

## 2016-11-09 ENCOUNTER — Ambulatory Visit: Admission: RE | Admit: 2016-11-09 | Payer: 59 | Source: Ambulatory Visit

## 2016-11-09 ENCOUNTER — Ambulatory Visit
Admission: RE | Admit: 2016-11-09 | Discharge: 2016-11-09 | Disposition: A | Discharge status: Home / Self Care | Payer: Commercial Managed Care - HMO | Source: Ambulatory Visit | Attending: Urology | Admitting: Urology

## 2016-11-09 DIAGNOSIS — N281 Cyst of kidney, acquired: Secondary | ICD-10-CM | POA: Insufficient documentation

## 2016-11-09 LAB — CBC
HCT: 34.1 % — ABNORMAL LOW (ref 35.0–47.0)
HEMOGLOBIN: 11.9 g/dL — AB (ref 12.0–16.0)
MCH: 30.4 pg (ref 26.0–34.0)
MCHC: 34.8 g/dL (ref 32.0–36.0)
MCV: 87.4 fL (ref 80.0–100.0)
PLATELETS: 313 10*3/uL (ref 150–440)
RBC: 3.9 MIL/uL (ref 3.80–5.20)
RDW: 14.8 % — ABNORMAL HIGH (ref 11.5–14.5)
WBC: 7.3 10*3/uL (ref 3.6–11.0)

## 2016-11-09 LAB — PROTIME-INR
INR: 1.03
PROTHROMBIN TIME: 13.5 s (ref 11.4–15.2)

## 2016-11-09 LAB — APTT: aPTT: 30 seconds (ref 24–36)

## 2016-11-09 MED ORDER — SODIUM CHLORIDE 0.9 % IV SOLN
INTRAVENOUS | Status: DC
  Administered 2016-11-09: 10:00:00 via INTRAVENOUS

## 2016-11-09 MED ORDER — FENTANYL CITRATE (PF) 100 MCG/2ML IJ SOLN
INTRAMUSCULAR | Status: AC | PRN
  Administered 2016-11-09: 50 ug via INTRAVENOUS
  Administered 2016-11-09: 12.5 ug via INTRAVENOUS
  Administered 2016-11-09: 25 ug via INTRAVENOUS
  Administered 2016-11-09: 12.5 ug via INTRAVENOUS

## 2016-11-09 MED ORDER — MIDAZOLAM HCL 5 MG/5ML IJ SOLN
INTRAMUSCULAR | Status: AC | PRN
  Administered 2016-11-09: 1 mg via INTRAVENOUS
  Administered 2016-11-09: 0.5 mg via INTRAVENOUS
  Administered 2016-11-09: 1 mg via INTRAVENOUS
  Administered 2016-11-09: 0.5 mg via INTRAVENOUS

## 2016-11-09 NOTE — Procedures (Signed)
US guided FNA (4) of left renal hilar lesion.   Technically difficult procedure due to renal location.  Minimal bleeding and no immediate complication. Plan for 3 hour bedrest.

## 2016-11-09 NOTE — Consult Note (Signed)
Chief Complaint: Patient was seen in consultation today for left renal lesion biopsy at the request of Nickie Retort  Referring Physician(s): Nickie Retort  Patient Status: [facility] - Out-pt  History of Present Illness: Sarah [name] is a 65 y.o. female with a suspicious left renal lesion. Patient had a recent MRI that demonstrated a Bosniak III lesion in the left renal hilar region. Patient presents for a biopsy of this lesion to help direct management of this lesion. Patient is asymptomatic except for midline back pain. She denies hematuria or dysuria. No change in appetite or weight loss.  Past Medical History:  Diagnosis Date  . Abnormal mammogram, unspecified   . Aneurysm ([place])    cerebral x  4  . Benign neoplasm of breast 2012  . Breast complaint 2010  . Hypertension   . Obesity, unspecified   . Personal history of tobacco use, presenting hazards to health   . Special screening for malignant neoplasms, colon     Past Surgical History:  Procedure Laterality Date  . ABDOMINAL HYSTERECTOMY  1993  . BREAST BIOPSY Right 2012   stereo bx showing a 72mm radial scar in an estimated 8 volume of tissue  . BREAST BIOPSY Right Apr 09, 2005   Fibrocystic changes, ductal adenosis, microcalcifications, focal stromal/epithelial proliferation, possible fibroadenoma type proliferation  . CEREBRAL ANEURYSM REPAIR  2004  . COLONOSCOPY  2008   Dr. Nicolasa Ducking  . EYE SURGERY    . FOOT SURGERY  2003  . TONSILLECTOMY  1997    Allergies: Codeine  Medications: Prior to Admission medications   Medication Sig Start Date End Date Taking? Authorizing Provider  acetaminophen (TYLENOL) 325 MG tablet Take 650 mg by mouth every 6 (six) hours as needed.   Yes Historical Provider, MD  amLODipine (NORVASC) 5 MG tablet Take 1 tablet (5 mg total) by mouth daily. 06/09/16  Yes Kathrine Haddock, NP  aspirin 81 MG tablet Take 81 mg by mouth daily.   Yes Historical Provider, MD  atenolol (TENORMIN)  100 MG tablet Take 1 tablet (100 mg total) by mouth daily. 06/09/16  Yes Kathrine Haddock, NP  benazepril-hydrochlorthiazide (LOTENSIN HCT) 20-25 MG tablet Take 1 tablet by mouth daily. 06/09/16  Yes Kathrine Haddock, NP  Cholecalciferol (VITAMIN D-3 PO) Take 600 Units by mouth daily.   Yes Historical Provider, MD  citalopram (CELEXA) 20 MG tablet Take 1 tablet (20 mg total) by mouth at bedtime. 06/09/16  Yes Kathrine Haddock, NP  ibuprofen (ADVIL,MOTRIN) 800 MG tablet Take 1 tablet (800 mg total) by mouth every 8 (eight) hours as needed. 11/11/15  Yes Pierce Crane Beers, PA-C  Multiple Vitamin (MULTIVITAMIN) tablet Take 1 tablet by mouth daily.   Yes Historical Provider, MD     Family History  Problem Relation Age of Onset  . Cancer Cousin     maternal first cousin with breast cancer  . Cancer Other     colon and ovarian cancers, no relationship listed  . Cancer Father     prostate  . Hypertension Sister   . Hypertension Brother   . Hypertension Paternal Grandmother   . Heart disease Paternal Grandmother   . Hypertension Paternal Grandfather   . Heart disease Paternal Grandfather     Social History   Social History  . Marital status: Divorced    Spouse name: N/A  . Number of children: N/A  . Years of education: N/A   Social History Main Topics  . Smoking status: Current Every Day  Smoker    Packs/day: 0.75    Years: 40.00    Types: Cigarettes  . Smokeless tobacco: Never Used  . Alcohol use Yes     Comment: wine on occasion  . Drug use: No  . Sexual activity: Yes   Other Topics Concern  . None   Social History Narrative  . None      Review of Systems  Constitutional: Negative for appetite change, chills, fever and unexpected weight change.  Respiratory: Negative.   Cardiovascular: Negative for chest pain.  Gastrointestinal: Negative.   Genitourinary: Negative for difficulty urinating, dysuria and hematuria.  Skin: Positive for color change.    Vital Signs: BP (!) 145/70    Pulse 65   Temp 98.3 F (36.8 C) (Oral)   Resp 12   SpO2 98%   Physical Exam  Constitutional: She appears well-developed.  Cardiovascular: Normal rate, regular rhythm and normal heart sounds.   Pulmonary/Chest: Effort normal and breath sounds normal.  Abdominal: Soft. Bowel sounds are normal. She exhibits no distension. There is no tenderness.    Mallampati Score: 2     Imaging: Mr Abdomen W Wo Contrast  Result Date: 10/15/2016 CLINICAL DATA:  Indeterminate RIGHT renal lesion on CT. MRI recommended for further characterization. EXAM: MRI ABDOMEN WITHOUT AND WITH CONTRAST TECHNIQUE: Multiplanar multisequence MR imaging of the abdomen was performed both before and after the administration of intravenous contrast. CONTRAST:  53mL MULTIHANCE GADOBENATE DIMEGLUMINE 529 MG/ML IV SOLN COMPARISON:  CT 09/01/2016 FINDINGS: Lower chest:  Lung bases are clear. Hepatobiliary: No focal hepatic lesion. No duct dilatation. Large smooth oval 2.6 cm gallstone Pancreas: Normal pancreatic parenchymal intensity. No ductal dilatation or inflammation. Spleen: Normal spleen. Adrenals/urinary tract: Enlargement of the RIGHT adrenal gland to 2.6 cm. Lesion has loss of signal intensity on opposed phase imaging (series 4) consistent with benign adenoma. In the cortex of the RIGHT kidney there is a partially exophytic round lesion measuring 3.0 cm (image 30, series 1)3. This lesion has intermediate signal intensity on T1 weighted imaging and no post-contrast enhancement (series 8 and series 13). There is a more complex cystic lesion the cortex of the mid to lower RIGHT kidney measuring 3.0 cm in craniocaudad dimension (image 37, series 17). This cystic lesion is multi lobulated with intervening septations. Lesion is hyperintense on T2 weighted imaging and mixed on T1 weighted imaging. In the medial aspect of the midl LEFT kidney, 2.5 cm lesion (image 18, series 2) which is hyperintense on T2 weighted imaging. This lesion  demonstrates post-contrast enhancement of thickened internal septation (image 40, series 13). Additional bilateral Bosniak 1 nonenhancing cysts. Stomach/Bowel: Stomach and limited of the small bowel is unremarkable Vascular/Lymphatic: Abdominal aortic normal caliber. No retroperitoneal periportal lymphadenopathy. Musculoskeletal: No aggressive osseous lesion IMPRESSION: 1. Indeterminate lesion in the upper pole of the RIGHT kidney identified on comparison CT is categorized as a Bosniak 2 nonenhancing renal cyst. 2. More complex cystic lesion in the lower pole of the RIGHT kidney has multiple septation and categorized as Bosniak IIF cystic renal lesion. 3. Within the medial LEFT kidney, more complex lesion with thick and enhancing septation is categorized as BOSNIAK III CYSTIC RENAL LESION. 4. Additional Bosniak 1 renal cysts throughout the kidneys. 5. Benign RIGHT adrenal adenoma 6. RECOMMEND UROLOGY CONSULTATION. These results will be called to the ordering clinician or representative by the Radiologist Assistant, and communication documented in the PACS or zVision Dashboard. Electronically Signed   By: Suzy Bouchard M.D.   On: 10/15/2016 12:19  Labs:  CBC:  Recent Labs  04/22/16 1052 11/09/16 0932  WBC 6.1 7.3  HGB 13.8 11.9*  HCT 41.6 34.1*  PLT 266 313    COAGS: No results for input(s): INR, APTT in the last 8760 hours.  BMP:  Recent Labs  04/22/16 1052 06/09/16 1508 10/15/16 0929  NA 136 143  --   K 3.2* 3.6  --   CL 104 103  --   CO2 22 23  --   GLUCOSE 110* 93  --   BUN 21* 20  --   CALCIUM 9.2 9.2  --   CREATININE 1.64* 1.12* 1.05*  GFRNONAA 32* 52* 55*  GFRAA 37* 60 >60    LIVER FUNCTION TESTS:  Recent Labs  04/22/16 1052 06/09/16 1508  BILITOT 0.4 <0.2  AST 25 14  ALT 19 10  ALKPHOS 85 90  PROT 8.2* 6.5  ALBUMIN 4.0 3.7    TUMOR MARKERS: No results for input(s): AFPTM, CEA, CA199, CHROMGRNA in the last 8760 hours.  Assessment and  Plan:  65 year old with renal cysts and indeterminate lesion in the left renal hilum. Patient presents for image guided biopsy. Discussed the risks of the procedure which include bleeding and infection. We also discussed the risks of moderate sedation. Informed consent was obtained from the patient. Plan for ultrasound-guided biopsy of the left renal lesion. We may need to use CT guidance if ultrasound imaging is not adequate.  Thank you for this interesting consult.  I greatly enjoyed meeting Sarah [name] and look forward to participating in their care.  A copy of this report was sent to the requesting provider on this date.  Electronically Signed: Carylon Perches 11/09/2016, 9:58 AM   I spent a total of  15 Minutes   in face to face in clinical consultation, greater than 50% of which was counseling/coordinating care for left renal lesion biopsy.

## 2016-11-12 ENCOUNTER — Telehealth: Payer: Self-pay

## 2016-11-12 NOTE — Telephone Encounter (Signed)
Dr. Luana Shu called from pathology about her cytology from her Renal Guided US biopsy. She reports because the small amount that was sent and their lack of during a lot of Renal Cytology she have decided to send the Cytology to [facility] to rule out Simple Cyst or Renal Cell Carcinoma. She put a rush on it in hopes that we get it back before the Holidays. If you have any questions you can reach her back at [phone].

## 2016-11-19 ENCOUNTER — Ambulatory Visit: Payer: 59 | Admitting: Urology

## 2016-11-24 ENCOUNTER — Telehealth: Payer: Self-pay

## 2016-11-24 LAB — CYTOLOGY - NON PAP

## 2016-11-24 NOTE — Telephone Encounter (Signed)
[facility] Pathology Dr. Luana Shu called stating that the report on patient's pathology from [facility] came back and they state it is highly suspicious  for Clear Cell Renal Cell Carcinoma, when should we reschedule patient's follow up to discuss results?

## 2016-11-25 NOTE — Telephone Encounter (Signed)
Please schedule

## 2016-11-30 DIAGNOSIS — C642 Malignant neoplasm of left kidney, except renal pelvis: Secondary | ICD-10-CM

## 2016-11-30 HISTORY — DX: Malignant neoplasm of left kidney, except renal pelvis: C64.2

## 2016-12-03 ENCOUNTER — Ambulatory Visit (INDEPENDENT_AMBULATORY_CARE_PROVIDER_SITE_OTHER): Payer: 59 | Admitting: Urology

## 2016-12-03 ENCOUNTER — Encounter: Payer: Self-pay | Admitting: Urology

## 2016-12-03 VITALS — BP 133/79 | HR 72 | Ht 65.0 in | Wt 220.4 lb

## 2016-12-03 DIAGNOSIS — C642 Malignant neoplasm of left kidney, except renal pelvis: Secondary | ICD-10-CM

## 2016-12-03 NOTE — Progress Notes (Signed)
12/03/2016 2:12 PM   Sarah [name] May 09, 1951 [id number]  Referring provider: Kathrine Haddock, NP [place], [place] [address]  Chief Complaint  Patient presents with  . Follow-up    Pathology results    HPI: The patient is a 66 year old female who presents today for evaluation of a Bosniak 3 left renal cyst. This was found after an incidental finding of a CT of her chest revealed an indeterminate renal lesion.  On review of the imaging, the patient has a medial left Bosniak III renal cyst in very close proximity to the hilum. She also has a lower pole right Bosniak IIF cyst. There is also a Bosniak II right renal cyst and bilateral Bosniak I renal cysts. The patient has no other urological history. She voids without difficulty. She has history of UTI, nephrolithiasis, and gross hematuria.  She underwent ultrasound-guided aspiration of this left renal mass which showed cells highly suspicious for clear cell renal cell carcinoma.    GFR was 60 with a current of 1.12 five months ago when last checked.  Cytology: DIAGNOSIS:  A. LEFT KIDNEY; ULTRASOUND-GUIDED FINE-NEEDLE ASPIRATION:  - HIGHLY SUSPICIOUS FOR CLEAR CELL RENAL CELL CARCINOMA, SEE COMMENT.   Comment: The specimen was sent to [facility]  [place], [place]) for external consultation. The findings of the  external consultant are incorporated above. This is the note of Dr.  Chesley Mires: Hesitation for making a definite diagnosis of clear cell renal  cell carcinoma is because of the limited amount of atypical cells  present.    PMH: Past Medical History:  Diagnosis Date  . Abnormal mammogram, unspecified   . Aneurysm ([place])    cerebral x  4  . Benign neoplasm of breast 2012  . Breast complaint 2010  . Hypertension   . Obesity, unspecified   . Personal history of tobacco use, presenting hazards to health   . Special screening for malignant neoplasms, colon     Surgical History: Past  Surgical History:  Procedure Laterality Date  . ABDOMINAL HYSTERECTOMY  1993  . BREAST BIOPSY Right 2012   stereo bx showing a 50mm radial scar in an estimated 8 volume of tissue  . BREAST BIOPSY Right Apr 09, 2005   Fibrocystic changes, ductal adenosis, microcalcifications, focal stromal/epithelial proliferation, possible fibroadenoma type proliferation  . CEREBRAL ANEURYSM REPAIR  2004  . COLONOSCOPY  2008   Dr. Nicolasa Ducking  . EYE SURGERY    . FOOT SURGERY  2003  . TONSILLECTOMY  1997    Home Medications:  Allergies as of 12/03/2016      Reactions   Codeine Itching      Medication List       Accurate as of 12/03/16  2:12 PM. Always use your most recent med list.          acetaminophen 325 MG tablet Commonly known as:  TYLENOL Take 650 mg by mouth every 6 (six) hours as needed.   amLODipine 5 MG tablet Commonly known as:  NORVASC Take 1 tablet (5 mg total) by mouth daily.   aspirin 81 MG tablet Take 81 mg by mouth daily.   atenolol 100 MG tablet Commonly known as:  TENORMIN Take 1 tablet (100 mg total) by mouth daily.   benazepril-hydrochlorthiazide 20-25 MG tablet Commonly known as:  LOTENSIN HCT Take 1 tablet by mouth daily.   citalopram 20 MG tablet Commonly known as:  CELEXA Take 1 tablet (20 mg total) by mouth at bedtime.   ibuprofen  800 MG tablet Commonly known as:  ADVIL,MOTRIN Take 1 tablet (800 mg total) by mouth every 8 (eight) hours as needed.   multivitamin tablet Take 1 tablet by mouth daily.   VITAMIN D-3 PO Take 600 Units by mouth daily.       Allergies:  Allergies  Allergen Reactions  . Codeine Itching    Family History: Family History  Problem Relation Age of Onset  . Cancer Cousin     maternal first cousin with breast cancer  . Cancer Other     colon and ovarian cancers, no relationship listed  . Cancer Father     prostate  . Hypertension Sister   . Hypertension Brother   . Hypertension Paternal Grandmother   . Heart disease  Paternal Grandmother   . Hypertension Paternal Grandfather   . Heart disease Paternal Grandfather     Social History:  reports that she has been smoking Cigarettes.  She has a 30.00 pack-year smoking history. She has never used smokeless tobacco. She reports that she drinks alcohol. She reports that she does not use drugs.  ROS:                                        Physical Exam: BP 133/79 (BP Location: Left Arm, Patient Position: Sitting, Cuff Size: Large)   Pulse 72   Ht 5\' 5"  (1.651 m)   Wt 220 lb 6.4 oz (100 kg)   BMI 36.68 kg/m   Constitutional:  Alert and oriented, No acute distress. HEENT: [place] AT, moist mucus membranes.  Trachea midline, no masses. Cardiovascular: No clubbing, cyanosis, or edema. Respiratory: Normal respiratory effort, no increased work of breathing. GI: Abdomen is soft, nontender, nondistended, no abdominal masses GU: No CVA tenderness.  Skin: No rashes, bruises or suspicious lesions. Lymph: No cervical or inguinal adenopathy. Neurologic: Grossly intact, no focal deficits, moving all 4 extremities. Psychiatric: Normal mood and affect.  Laboratory Data: Lab Results  Component Value Date   WBC 7.3 11/09/2016   HGB 11.9 (L) 11/09/2016   HCT 34.1 (L) 11/09/2016   MCV 87.4 11/09/2016   PLT 313 11/09/2016    Lab Results  Component Value Date   CREATININE 1.05 (H) 10/15/2016    No results found for: PSA  No results found for: TESTOSTERONE  No results found for: HGBA1C  Urinalysis No results found for: COLORURINE, APPEARANCEUR, LABSPEC, PHURINE, GLUCOSEU, HGBUR, BILIRUBINUR, KETONESUR, PROTEINUR, UROBILINOGEN, NITRITE, LEUKOCYTESUR  Pertinent Imaging: Left renal mass as above on MRI  Assessment & Plan:    1. Left renal cell carcinoma I discussed with the patient her new diagnosis of renal cell carcinoma or kidney. The tumor is not large in size however it is very medial to the left renal artery and vein. We discussed  that is not amenable to a partial nephrectomy. She have to undergo a left radical nephrectomy for her new diagnosis. We discussed this procedure weighs to do it. She understands that I recommend doing a left hand assisted laparoscopic nephrectomy. She understands the risks, benefits, indications of this procedure. She understands risks include but are not but to bleeding, infection, need for transfusion, damage to surrounding structures, prolonged hospitalization, anesthetic complications, and death. She understands she'll be in the hospital overnight. All questions were answered. The patient has elected to proceed.  We will get her on the schedule for surgery within the next month. Since we  discussed a large surgery and the patient will probably come up with more questions prior to that, I will have the patient see me a few weeks prior to surgery to answer any further questions that may arise.   No Follow-up on file.  Nickie Retort, MD  [facility] [address] [place], [place] [address] [phone]

## 2016-12-04 ENCOUNTER — Ambulatory Visit: Payer: 59

## 2016-12-07 ENCOUNTER — Telehealth: Payer: Self-pay | Admitting: Radiology

## 2016-12-07 ENCOUNTER — Other Ambulatory Visit: Payer: Self-pay | Admitting: Radiology

## 2016-12-07 DIAGNOSIS — C642 Malignant neoplasm of left kidney, except renal pelvis: Secondary | ICD-10-CM

## 2016-12-07 NOTE — Telephone Encounter (Signed)
Notified pt of surgery scheduled with Dr Pilar Jarvis on 01/01/17,pre-admit testing appt on 12/21/16 @9 :83 & to call day prior to surgery for arrival time to SDS. Pt voices understanding.

## 2016-12-11 ENCOUNTER — Ambulatory Visit: Payer: 59 | Admitting: Unknown Physician Specialty

## 2016-12-15 ENCOUNTER — Encounter: Payer: Self-pay | Admitting: Unknown Physician Specialty

## 2016-12-15 ENCOUNTER — Ambulatory Visit (INDEPENDENT_AMBULATORY_CARE_PROVIDER_SITE_OTHER): Payer: 59 | Admitting: Unknown Physician Specialty

## 2016-12-15 DIAGNOSIS — I10 Essential (primary) hypertension: Secondary | ICD-10-CM | POA: Diagnosis not present

## 2016-12-15 DIAGNOSIS — F324 Major depressive disorder, single episode, in partial remission: Secondary | ICD-10-CM | POA: Diagnosis not present

## 2016-12-15 MED ORDER — BENAZEPRIL-HYDROCHLOROTHIAZIDE 20-25 MG PO TABS: 1.0000 | ORAL_TABLET | Freq: Every day | ORAL | 1 refills | Status: DC

## 2016-12-15 MED ORDER — CITALOPRAM HYDROBROMIDE 20 MG PO TABS: 20.0000 mg | ORAL_TABLET | Freq: Every day | ORAL | 1 refills | Status: DC

## 2016-12-15 MED ORDER — AMLODIPINE BESYLATE 5 MG PO TABS: 5.0000 mg | ORAL_TABLET | Freq: Every day | ORAL | 1 refills | Status: DC

## 2016-12-15 MED ORDER — ATENOLOL 100 MG PO TABS: 100.0000 mg | ORAL_TABLET | Freq: Every day | ORAL | 1 refills | Status: DC

## 2016-12-15 NOTE — Assessment & Plan Note (Signed)
Stable, continue present medications.   

## 2016-12-15 NOTE — Progress Notes (Signed)
   BP 105/63 (BP Location: Left Arm, Patient Position: Sitting, Cuff Size: Large)   Pulse 75   Temp 98.1 F (36.7 C)   Ht 5' 5.7" (1.669 m)   Wt 218 lb 6.4 oz (99.1 kg)   SpO2 95%   BMI 35.57 kg/m    Subjective:    Patient ID: Sarah [name], female    DOB: May 25, 1951, 66 y.o.   MRN: [id number]  HPI: Sarah [name] is a 66 y.o. female  Chief Complaint  Patient presents with  . Depression  . Hyperlipidemia  . Hypertension   Hypertension Using medications without difficulty Average home BPs   No problems or lightheadedness No chest pain with exertion or shortness of breath No Edema   Hyperlipidemia Using medications without problems: No Muscle aches  Diet compliance: Exercise:  Depression Relevant past medical, surgical, family and social history reviewed and updated as indicated. Interim medical history since our last visit reviewed. Allergies and medications reviewed and updated.  Renal CA Due to have left kidney removed  Review of Systems  Per HPI unless specifically indicated above     Objective:    BP 105/63 (BP Location: Left Arm, Patient Position: Sitting, Cuff Size: Large)   Pulse 75   Temp 98.1 F (36.7 C)   Ht 5' 5.7" (1.669 m)   Wt 218 lb 6.4 oz (99.1 kg)   SpO2 95%   BMI 35.57 kg/m   Wt Readings from Last 3 Encounters:  12/15/16 218 lb 6.4 oz (99.1 kg)  12/03/16 220 lb 6.4 oz (100 kg)  10/29/16 221 lb (100.2 kg)    Physical Exam  Constitutional: She is oriented to person, place, and time. She appears well-developed and well-nourished. No distress.  HENT:  Head: Normocephalic and atraumatic.  Eyes: Conjunctivae and lids are normal. Right eye exhibits no discharge. Left eye exhibits no discharge. No scleral icterus.  Neck: Normal range of motion. Neck supple. No JVD present. Carotid bruit is not present.  Cardiovascular: Normal rate, regular rhythm and normal heart sounds.   Pulmonary/Chest: Effort normal and breath sounds normal.    Abdominal: Normal appearance. There is no splenomegaly or hepatomegaly.  Musculoskeletal: Normal range of motion.  Neurological: She is alert and oriented to person, place, and time.  Skin: Skin is warm, dry and intact. No rash noted. No pallor.  Psychiatric: She has a normal mood and affect. Her behavior is normal. Judgment and thought content normal.     Assessment & Plan:   Problem List Items Addressed This Visit      Unprioritized   Depression    Stable, continue present medications.        Relevant Medications   citalopram (CELEXA) 20 MG tablet   Essential hypertension, benign    Stable, continue present medications.        Relevant Medications   amLODipine (NORVASC) 5 MG tablet   atenolol (TENORMIN) 100 MG tablet   benazepril-hydrochlorthiazide (LOTENSIN HCT) 20-25 MG tablet      No labs today as is due for pre-op labs.    Follow up plan: Return in about 6 months (around 06/14/2017) for physical.

## 2016-12-18 ENCOUNTER — Encounter: Payer: Self-pay | Admitting: Urology

## 2016-12-18 ENCOUNTER — Ambulatory Visit (INDEPENDENT_AMBULATORY_CARE_PROVIDER_SITE_OTHER): Payer: 59 | Admitting: Urology

## 2016-12-18 VITALS — BP 121/71 | HR 71 | Ht 67.5 in | Wt 214.8 lb

## 2016-12-18 DIAGNOSIS — C642 Malignant neoplasm of left kidney, except renal pelvis: Secondary | ICD-10-CM

## 2016-12-18 LAB — URINALYSIS, COMPLETE
BILIRUBIN UA: NEGATIVE
GLUCOSE, UA: NEGATIVE
Ketones, UA: NEGATIVE
Leukocytes, UA: NEGATIVE
NITRITE UA: NEGATIVE
Specific Gravity, UA: 1.015 (ref 1.005–1.030)
UUROB: 0.2 mg/dL (ref 0.2–1.0)
pH, UA: 5.5 (ref 5.0–7.5)

## 2016-12-18 LAB — MICROSCOPIC EXAMINATION: Epithelial Cells (non renal): >10 /hpf — AB (ref 0–10)

## 2016-12-18 NOTE — Progress Notes (Signed)
12/18/2016 1:56 PM   Sarah [name] [birth year] [id number]  Referring provider: Kathrine Haddock, NP [place], [place] [address]  Chief Complaint  Patient presents with  . Follow-up    discuss surgery    HPI: The patient is a 66 year old female who presents today for evaluation of a Bosniak 3 left renal cyst. This was found after an incidental finding of a CT of her chest revealed an indeterminate renal lesion.  On review of the imaging, the patient has a medial left Bosniak IIIrenal cyst in very close proximity to the hilum. She also has a lower pole right Bosniak IIF cyst. There is also a Bosniak IIright renal cyst and bilateral Bosniak I renal cysts. The patient has no other urological history. She voids without difficulty. She has history of UTI, nephrolithiasis, andgross hematuria.  She underwent ultrasound-guided aspiration of this left renal mass which showed cells highly suspicious for clear cell renal cell carcinoma.    GFR was 60 with a current of 1.12 five months ago when last checked.  Cytology: DIAGNOSIS:  A. LEFT KIDNEY; ULTRASOUND-GUIDED FINE-NEEDLE ASPIRATION:  - HIGHLY SUSPICIOUS FOR CLEAR CELL RENAL CELL CARCINOMA, SEE COMMENT.   Comment: The specimen was sent to [facility]  [place], [place]) for external consultation. The findings of the  external consultant are incorporated above. This is the note of Dr.  Chesley Mires: Hesitation for making a definite diagnosis of clear cell renal  cell carcinoma is because of the limited amount of atypical cells  present.     She is scheduled for surgery in a few weeks however she returns today for the opportunity to discuss any questions that have arisen since she was first diagnosed with left renal cell carcinoma.   PMH: Past Medical History:  Diagnosis Date  . Abnormal mammogram, unspecified   . Aneurysm ([place])    cerebral x  4  . Benign neoplasm of breast 2012  . Breast complaint 2010    . Hypertension   . Obesity, unspecified   . Personal history of tobacco use, presenting hazards to health   . Special screening for malignant neoplasms, colon     Surgical History: Past Surgical History:  Procedure Laterality Date  . ABDOMINAL HYSTERECTOMY  1993  . BREAST BIOPSY Right 2012   stereo bx showing a 60mm radial scar in an estimated 8 volume of tissue  . BREAST BIOPSY Right Apr 09, 2005   Fibrocystic changes, ductal adenosis, microcalcifications, focal stromal/epithelial proliferation, possible fibroadenoma type proliferation  . CEREBRAL ANEURYSM REPAIR  2004  . COLONOSCOPY  2008   Dr. Nicolasa Ducking  . EYE SURGERY    . FOOT SURGERY  2003  . TONSILLECTOMY  1997    Home Medications:  Allergies as of 12/18/2016      Reactions   Codeine Itching      Medication List       Accurate as of 12/18/16  1:56 PM. Always use your most recent med list.          acetaminophen 325 MG tablet Commonly known as:  TYLENOL Take 650 mg by mouth every 6 (six) hours as needed for mild pain, moderate pain or headache.   amLODipine 5 MG tablet Commonly known as:  NORVASC Take 1 tablet (5 mg total) by mouth daily.   aspirin 81 MG tablet Take 81 mg by mouth daily.   atenolol 100 MG tablet Commonly known as:  TENORMIN Take 1 tablet (100 mg total) by mouth daily.  benazepril-hydrochlorthiazide 20-25 MG tablet Commonly known as:  LOTENSIN HCT Take 1 tablet by mouth daily.   citalopram 20 MG tablet Commonly known as:  CELEXA Take 1 tablet (20 mg total) by mouth at bedtime.   ibuprofen 800 MG tablet Commonly known as:  ADVIL,MOTRIN Take 1 tablet (800 mg total) by mouth every 8 (eight) hours as needed.   multivitamin tablet Take 1 tablet by mouth daily.   VITAMIN D-3 PO Take 600 Units by mouth daily.       Allergies:  Allergies  Allergen Reactions  . Codeine Itching    Family History: Family History  Problem Relation Age of Onset  . Cancer Cousin     maternal first  cousin with breast cancer  . Cancer Other     colon and ovarian cancers, no relationship listed  . Cancer Father     prostate  . Hypertension Sister   . Hypertension Brother   . Hypertension Paternal Grandmother   . Heart disease Paternal Grandmother   . Hypertension Paternal Grandfather   . Heart disease Paternal Grandfather     Social History:  reports that she has been smoking Cigarettes.  She has a 20.00 pack-year smoking history. She has never used smokeless tobacco. She reports that she drinks alcohol. She reports that she does not use drugs.  ROS: UROLOGY Frequent Urination?: No Hard to postpone urination?: No Burning/pain with urination?: No Get up at night to urinate?: No Leakage of urine?: No Urine stream starts and stops?: No Trouble starting stream?: No Do you have to strain to urinate?: No Blood in urine?: No Urinary tract infection?: No Sexually transmitted disease?: No Injury to kidneys or bladder?: No Painful intercourse?: No Weak stream?: No Currently pregnant?: No Vaginal bleeding?: No Last menstrual period?: n  Gastrointestinal Nausea?: No Vomiting?: No Indigestion/heartburn?: No Diarrhea?: No Constipation?: No  Constitutional Fever: No Night sweats?: No Weight loss?: No Fatigue?: No  Skin Skin rash/lesions?: No Itching?: No  Eyes Blurred vision?: No Double vision?: No  Ears/Nose/Throat Sore throat?: No Sinus problems?: No  Hematologic/Lymphatic Swollen glands?: No Easy bruising?: No  Cardiovascular Leg swelling?: No Chest pain?: No  Respiratory Cough?: No Shortness of breath?: No  Endocrine Excessive thirst?: No  Musculoskeletal Back pain?: No Joint pain?: No  Neurological Headaches?: No Dizziness?: No  Psychologic Depression?: No Anxiety?: No  Physical Exam: BP 121/71 (BP Location: Left Arm, Patient Position: Sitting, Cuff Size: Large)   Pulse 71   Ht 5' 7.5" (1.715 m)   Wt 214 lb 12.8 oz (97.4 kg)   BMI  33.15 kg/m   Constitutional:  Alert and oriented, No acute distress. HEENT: [place] AT, moist mucus membranes.  Trachea midline, no masses. Cardiovascular: No clubbing, cyanosis, or edema. Respiratory: Normal respiratory effort, no increased work of breathing. GI: Abdomen is soft, nontender, nondistended, no abdominal masses GU: No CVA tenderness.  Skin: No rashes, bruises or suspicious lesions. Lymph: No cervical or inguinal adenopathy. Neurologic: Grossly intact, no focal deficits, moving all 4 extremities. Psychiatric: Normal mood and affect.  Laboratory Data: Lab Results  Component Value Date   WBC 7.3 11/09/2016   HGB 11.9 (L) 11/09/2016   HCT 34.1 (L) 11/09/2016   MCV 87.4 11/09/2016   PLT 313 11/09/2016    Lab Results  Component Value Date   CREATININE 1.05 (H) 10/15/2016    No results found for: PSA  No results found for: TESTOSTERONE  No results found for: HGBA1C  Urinalysis No results found for: COLORURINE, APPEARANCEUR,  LABSPEC, PHURINE, GLUCOSEU, HGBUR, BILIRUBINUR, KETONESUR, PROTEINUR, UROBILINOGEN, NITRITE, LEUKOCYTESUR  Pertinent Imaging: Left renal mass as above on MRI  Assessment & Plan:    1. Left RCC I again discussed with the patient her diagnosis of left RCC with a very medial tumor. Begin discussed in great detail a left laparoscopic hand-assisted nephrectomy. As we discussed last time we also discussed the risks, benefits, indications procedure. She had the opportunity this appointment to ask any more questions that have arisen since her last appointment. All questions were answered at this time. She agrees to proceed. She is scheduled for surgery in approximately 2 weeks.   Nickie Retort, MD  [facility] [address] [place], [place] [address] [phone]

## 2016-12-21 ENCOUNTER — Encounter
Admission: RE | Admit: 2016-12-21 | Discharge: 2016-12-21 | Disposition: A | Discharge status: Home / Self Care | Payer: 59 | Source: Ambulatory Visit | Attending: Urology | Admitting: Urology

## 2016-12-21 DIAGNOSIS — I10 Essential (primary) hypertension: Secondary | ICD-10-CM

## 2016-12-21 DIAGNOSIS — I517 Cardiomegaly: Secondary | ICD-10-CM | POA: Diagnosis not present

## 2016-12-21 DIAGNOSIS — Z01810 Encounter for preprocedural cardiovascular examination: Secondary | ICD-10-CM | POA: Insufficient documentation

## 2016-12-21 DIAGNOSIS — Z01812 Encounter for preprocedural laboratory examination: Secondary | ICD-10-CM | POA: Insufficient documentation

## 2016-12-21 DIAGNOSIS — Z01818 Encounter for other preprocedural examination: Secondary | ICD-10-CM | POA: Diagnosis not present

## 2016-12-21 HISTORY — DX: Anxiety disorder, unspecified: F41.9

## 2016-12-21 HISTORY — DX: Chronic kidney disease, unspecified: N18.9

## 2016-12-21 HISTORY — DX: Unspecified osteoarthritis, unspecified site: M19.90

## 2016-12-21 LAB — BASIC METABOLIC PANEL
Anion gap: 10 (ref 5–15)
BUN: 23 mg/dL — ABNORMAL HIGH (ref 6–20)
CO2: 27 mmol/L (ref 22–32)
CREATININE: 1.73 mg/dL — AB (ref 0.44–1.00)
Calcium: 9 mg/dL (ref 8.9–10.3)
Chloride: 99 mmol/L — ABNORMAL LOW (ref 101–111)
GFR calc Af Amer: 35 mL/min — ABNORMAL LOW (ref >60)
GFR calc non Af Amer: 30 mL/min — ABNORMAL LOW (ref >60)
Glucose, Bld: 100 mg/dL — ABNORMAL HIGH (ref 65–99)
POTASSIUM: 3.7 mmol/L (ref 3.5–5.1)
Sodium: 136 mmol/L (ref 135–145)

## 2016-12-21 LAB — CULTURE, URINE COMPREHENSIVE

## 2016-12-21 LAB — TYPE AND SCREEN
ABO/RH(D): A POS
Antibody Screen: NEGATIVE

## 2016-12-21 LAB — CBC
HEMATOCRIT: 34.3 % — AB (ref 35.0–47.0)
HEMOGLOBIN: 11.9 g/dL — AB (ref 12.0–16.0)
MCH: 30.6 pg (ref 26.0–34.0)
MCHC: 34.7 g/dL (ref 32.0–36.0)
MCV: 88.1 fL (ref 80.0–100.0)
PLATELETS: 373 10*3/uL (ref 150–440)
RBC: 3.89 MIL/uL (ref 3.80–5.20)
RDW: 13.7 % (ref 11.5–14.5)
WBC: 10.5 10*3/uL (ref 3.6–11.0)

## 2016-12-21 LAB — PROTIME-INR
INR: 1.09
Prothrombin Time: 14.1 seconds (ref 11.4–15.2)

## 2016-12-21 LAB — APTT: aPTT: 32 seconds (ref 24–36)

## 2016-12-21 NOTE — Patient Instructions (Signed)
  Your procedure is scheduled on: January 01, 2017 (Friday) Report to Same Day Surgery 2nd floor medical mall [facility] Entrance-take elevator on left to 2nd floor.  Check in with surgery information desk.) To find out your arrival time please call [phone] between 1PM - 3PM on December 31, 2016 (Thursday)  Remember: Instructions that are not followed completely may result in serious medical risk, up to and including death, or upon the discretion of your surgeon and anesthesiologist your surgery may need to be rescheduled.    _x___ 1. Do not eat food or drink liquids after midnight. No gum chewing or hard candies.     __x__ 2. No Alcohol for 24 hours before or after surgery.   __x__3. No Smoking for 24 prior to surgery.   ____  4. Bring all medications with you on the day of surgery if instructed.    __x__ 5. Notify your doctor if there is any change in your medical condition     (cold, fever, infections).     Do not wear jewelry, make-up, hairpins, clips or nail polish.  Do not wear lotions, powders, or perfumes. You may wear deodorant.  Do not shave 48 hours prior to surgery. Men may shave face and neck.  Do not bring valuables to the hospital.    [facility] is not responsible for any belongings or valuables.               Contacts, dentures or bridgework may not be worn into surgery.  Leave your suitcase in the car. After surgery it may be brought to your room.  For patients admitted to the hospital, discharge time is determined by your treatment team.   Patients discharged the day of surgery will not be allowed to drive home.  You will need someone to drive you home and stay with you the night of your procedure.    Please read over the following fact sheets that you were given:   [facility] Preparing for Surgery and or MRSA Information   _x___ Take these medicines the morning of surgery with A SIP OF WATER:    1. AMLODIPINE  2.  ATENOLOL  3.  4.  5.  6.  ____Fleets enema or Magnesium Citrate as directed.   _x___ Use CHG Soap or sage wipes as directed on instruction sheet   ____ Use inhalers on the day of surgery and bring to hospital day of surgery  ____ Stop metformin 2 days prior to surgery    ____ Take 1/2 of usual insulin dose the night before surgery and none on the morning of           surgery.   _x___ Stop Aspirin, Coumadin, Pllavix ,Eliquis, Effient, or Pradaxa (STOP ASPIRIN ONE WEEK PRIOR TO SURGERY)  x__ Stop Anti-inflammatories such as Advil, Aleve, Ibuprofen, Motrin, Naproxen,          Naprosyn, Goodies powders or aspirin products. (STOP IBUPROFEN ONE WEEK PRIOR TO SURGERY)     Ok to take Tylenol.   ____ Stop supplements until after surgery.    ____ Bring C-Pap to the hospital.

## 2016-12-22 ENCOUNTER — Encounter
Admission: RE | Admit: 2016-12-22 | Discharge: 2016-12-22 | Disposition: A | Discharge status: Home / Self Care | Payer: 59 | Source: Ambulatory Visit | Attending: Urology | Admitting: Urology

## 2016-12-22 DIAGNOSIS — Z01818 Encounter for other preprocedural examination: Secondary | ICD-10-CM | POA: Diagnosis not present

## 2016-12-22 MED ORDER — MIDAZOLAM HCL 2 MG/2ML IJ SOLN
INTRAMUSCULAR | Status: AC
  Filled 2016-12-22: qty 2

## 2016-12-23 ENCOUNTER — Encounter: Payer: Self-pay | Admitting: Unknown Physician Specialty

## 2016-12-23 ENCOUNTER — Ambulatory Visit
Admission: RE | Admit: 2016-12-23 | Discharge: 2016-12-23 | Disposition: A | Discharge status: Home / Self Care | Payer: 59 | Source: Ambulatory Visit | Attending: Unknown Physician Specialty | Admitting: Unknown Physician Specialty

## 2016-12-23 ENCOUNTER — Ambulatory Visit (INDEPENDENT_AMBULATORY_CARE_PROVIDER_SITE_OTHER): Payer: 59 | Admitting: Unknown Physician Specialty

## 2016-12-23 VITALS — BP 99/64 | HR 84 | Temp 98.3°F | Wt 211.0 lb

## 2016-12-23 DIAGNOSIS — R059 Cough, unspecified: Secondary | ICD-10-CM

## 2016-12-23 DIAGNOSIS — R05 Cough: Secondary | ICD-10-CM | POA: Diagnosis not present

## 2016-12-23 DIAGNOSIS — Z01818 Encounter for other preprocedural examination: Secondary | ICD-10-CM | POA: Insufficient documentation

## 2016-12-23 DIAGNOSIS — I517 Cardiomegaly: Secondary | ICD-10-CM | POA: Insufficient documentation

## 2016-12-23 DIAGNOSIS — Z01812 Encounter for preprocedural laboratory examination: Secondary | ICD-10-CM | POA: Insufficient documentation

## 2016-12-23 DIAGNOSIS — R0689 Other abnormalities of breathing: Secondary | ICD-10-CM | POA: Diagnosis not present

## 2016-12-23 DIAGNOSIS — I10 Essential (primary) hypertension: Secondary | ICD-10-CM | POA: Insufficient documentation

## 2016-12-23 DIAGNOSIS — Z01810 Encounter for preprocedural cardiovascular examination: Secondary | ICD-10-CM | POA: Insufficient documentation

## 2016-12-23 MED ORDER — AZITHROMYCIN 250 MG PO TABS: ORAL_TABLET | ORAL | 0 refills | Status: DC

## 2016-12-23 NOTE — Progress Notes (Signed)
BP 99/64 (BP Location: Left Arm, Patient Position: Sitting, Cuff Size: Large)   Pulse 84   Temp 98.3 F (36.8 C)   Wt 211 lb (95.7 kg)   SpO2 97%   BMI 34.58 kg/m    Subjective:    Patient ID: Sarah [name], female    DOB: 11-01-1951, 66 y.o.   MRN: [id number]  HPI: Sarah [name] is a 66 y.o. female  Chief Complaint  Patient presents with  . Cough    pt states she had a cold last week when she was here but has a lingering cough  . No Appetite    pt states she has not had a appetitie since Saturday. States the things she has ate have not tasted right.    Pt is here for a nagging cough that has been going on for about 10 days.  States it started with a cold with lingering cough and poor appetite Cough  This is a new problem. The problem has been gradually improving. The problem occurs constantly. The cough is non-productive. Associated symptoms include chills, shortness of breath and weight loss. Pertinent negatives include no chest pain, fever, nasal congestion or sore throat. The symptoms are aggravated by lying down. She has tried OTC cough suppressant for the symptoms. The treatment provided no relief.   Also noted on lab review GFR of 35.  8 months ago it was low and then increased 2 months ago.    Relevant past medical, surgical, family and social history reviewed and updated as indicated. Interim medical history since our last visit reviewed. Allergies and medications reviewed and updated.  Review of Systems  Constitutional: Positive for chills and weight loss. Negative for fever.  HENT: Negative for sore throat.   Respiratory: Positive for cough and shortness of breath.   Cardiovascular: Negative for chest pain.    Per HPI unless specifically indicated above     Objective:    BP 99/64 (BP Location: Left Arm, Patient Position: Sitting, Cuff Size: Large)   Pulse 84   Temp 98.3 F (36.8 C)   Wt 211 lb (95.7 kg)   SpO2 97%   BMI 34.58 kg/m   Wt Readings  from Last 3 Encounters:  12/23/16 211 lb (95.7 kg)  12/21/16 214 lb (97.1 kg)  12/18/16 214 lb 12.8 oz (97.4 kg)    Physical Exam  Constitutional: She is oriented to person, place, and time. She appears well-developed and well-nourished. No distress.  HENT:  Head: Normocephalic and atraumatic.  Eyes: Conjunctivae and lids are normal. Right eye exhibits no discharge. Left eye exhibits no discharge. No scleral icterus.  Neck: Normal range of motion. Neck supple. No JVD present. Carotid bruit is not present.  Cardiovascular: Normal rate, regular rhythm and normal heart sounds.   Pulmonary/Chest: Effort normal. She has no decreased breath sounds. She has rhonchi in the left middle field and the left lower field.  Abdominal: Normal appearance. There is no splenomegaly or hepatomegaly.  Musculoskeletal: Normal range of motion.  Neurological: She is alert and oriented to person, place, and time.  Skin: Skin is warm, dry and intact. No rash noted. No pallor.  Psychiatric: She has a normal mood and affect. Her behavior is normal. Judgment and thought content normal.    Notes reviewed and CBC done 2 days ago with a normal white count Results for orders placed or performed during the hospital encounter of 12/21/16  APTT  Result Value Ref Range   aPTT 32  24 - 36 seconds  Basic metabolic panel  Result Value Ref Range   Sodium 136 135 - 145 mmol/L   Potassium 3.7 3.5 - 5.1 mmol/L   Chloride 99 (L) 101 - 111 mmol/L   CO2 27 22 - 32 mmol/L   Glucose, Bld 100 (H) 65 - 99 mg/dL   BUN 23 (H) 6 - 20 mg/dL   Creatinine, Ser 1.73 (H) 0.44 - 1.00 mg/dL   Calcium 9.0 8.9 - 10.3 mg/dL   GFR calc non Af Amer 30 (L) >60 mL/min   GFR calc Af Amer 35 (L) >60 mL/min   Anion gap 10 5 - 15  CBC  Result Value Ref Range   WBC 10.5 3.6 - 11.0 K/uL   RBC 3.89 3.80 - 5.20 MIL/uL   Hemoglobin 11.9 (L) 12.0 - 16.0 g/dL   HCT 34.3 (L) 35.0 - 47.0 %   MCV 88.1 80.0 - 100.0 fL   MCH 30.6 26.0 - 34.0 pg   MCHC  34.7 32.0 - 36.0 g/dL   RDW 13.7 11.5 - 14.5 %   Platelets 373 150 - 440 K/uL  Protime-INR  Result Value Ref Range   Prothrombin Time 14.1 11.4 - 15.2 seconds   INR 1.09   Type and screen [facility]  Result Value Ref Range   ABO/RH(D) A POS    Antibody Screen NEG    Sample Expiration 01/04/2017    Extend sample reason NO TRANSFUSIONS OR PREGNANCY IN THE PAST 3 MONTHS       Assessment & Plan:   Problem List Items Addressed This Visit    None    Visit Diagnoses    Cough    -  Primary   Relevant Orders   DG Chest 2 View (Completed)   Adventitious breath sounds       Relevant Orders   DG Chest 2 View (Completed)       Follow up plan: Return in about 4 days (around 12/27/2016).

## 2016-12-24 LAB — URINE CULTURE: Culture: NO GROWTH

## 2016-12-28 ENCOUNTER — Encounter: Payer: Self-pay | Admitting: Unknown Physician Specialty

## 2016-12-28 ENCOUNTER — Ambulatory Visit (INDEPENDENT_AMBULATORY_CARE_PROVIDER_SITE_OTHER): Payer: 59 | Admitting: Unknown Physician Specialty

## 2016-12-28 VITALS — BP 118/74 | HR 78 | Temp 98.2°F | Wt 212.8 lb

## 2016-12-28 DIAGNOSIS — R05 Cough: Secondary | ICD-10-CM | POA: Diagnosis not present

## 2016-12-28 DIAGNOSIS — R059 Cough, unspecified: Secondary | ICD-10-CM

## 2016-12-28 NOTE — Progress Notes (Signed)
   BP 118/74 (BP Location: Left Arm, Patient Position: Sitting, Cuff Size: Large)   Pulse 78   Temp 98.2 F (36.8 C)   Wt 212 lb 12.8 oz (96.5 kg)   SpO2 95%   BMI 34.87 kg/m    Subjective:    Patient ID: Sarah [name], female    DOB: 11-20-1951, 66 y.o.   MRN: [id number]  HPI: LOVELY KERINS is a 66 y.o. female  Chief Complaint  Patient presents with  . Follow-up    4 day f/up for cough, patient states she is much better    Cough F/u of her cough following Zpack.  She states her cough is "very seldom" and is "ready to get my surgery."   Relevant past medical, surgical, family and social history reviewed and updated as indicated. Interim medical history since our last visit reviewed. Allergies and medications reviewed and updated.  Review of Systems  Per HPI unless specifically indicated above     Objective:    BP 118/74 (BP Location: Left Arm, Patient Position: Sitting, Cuff Size: Large)   Pulse 78   Temp 98.2 F (36.8 C)   Wt 212 lb 12.8 oz (96.5 kg)   SpO2 95%   BMI 34.87 kg/m   Wt Readings from Last 3 Encounters:  12/28/16 212 lb 12.8 oz (96.5 kg)  12/23/16 211 lb (95.7 kg)  12/21/16 214 lb (97.1 kg)    Physical Exam  Constitutional: She is oriented to person, place, and time. She appears well-developed and well-nourished. No distress.  HENT:  Head: Normocephalic and atraumatic.  Eyes: Conjunctivae and lids are normal. Right eye exhibits no discharge. Left eye exhibits no discharge. No scleral icterus.  Neck: Normal range of motion. Neck supple. No JVD present. Carotid bruit is not present.  Cardiovascular: Normal rate, regular rhythm and normal heart sounds.   Pulmonary/Chest: Effort normal and breath sounds normal.  Abdominal: Normal appearance. There is no splenomegaly or hepatomegaly.  Musculoskeletal: Normal range of motion.  Neurological: She is alert and oriented to person, place, and time.  Skin: Skin is warm, dry and intact. No rash noted. No  pallor.  Psychiatric: She has a normal mood and affect. Her behavior is normal. Judgment and thought content normal.     Assessment & Plan:   Problem List Items Addressed This Visit    None    Visit Diagnoses    Cough    -  Primary   Much improved.  OK for surgery       Follow up plan: Return if symptoms worsen or fail to improve.

## 2016-12-31 ENCOUNTER — Telehealth: Payer: Self-pay | Admitting: Urology

## 2016-12-31 ENCOUNTER — Other Ambulatory Visit: Payer: Self-pay | Admitting: Radiology

## 2016-12-31 ENCOUNTER — Other Ambulatory Visit: Payer: Self-pay | Admitting: Urology

## 2016-12-31 ENCOUNTER — Other Ambulatory Visit
Admission: RE | Admit: 2016-12-31 | Discharge: 2016-12-31 | Disposition: A | Discharge status: Home / Self Care | Payer: 59 | Source: Ambulatory Visit | Attending: Urology | Admitting: Urology

## 2016-12-31 ENCOUNTER — Encounter: Payer: Self-pay | Admitting: Urology

## 2016-12-31 DIAGNOSIS — N2889 Other specified disorders of kidney and ureter: Secondary | ICD-10-CM | POA: Diagnosis not present

## 2016-12-31 DIAGNOSIS — Z0000 Encounter for general adult medical examination without abnormal findings: Secondary | ICD-10-CM | POA: Diagnosis not present

## 2016-12-31 DIAGNOSIS — Z5181 Encounter for therapeutic drug level monitoring: Secondary | ICD-10-CM

## 2016-12-31 LAB — BASIC METABOLIC PANEL
Anion gap: 8 (ref 5–15)
BUN: 20 mg/dL (ref 6–20)
CO2: 25 mmol/L (ref 22–32)
Calcium: 9.1 mg/dL (ref 8.9–10.3)
Chloride: 103 mmol/L (ref 101–111)
Creatinine, Ser: 1.09 mg/dL — ABNORMAL HIGH (ref 0.44–1.00)
GFR calc Af Amer: >60 mL/min (ref >60)
GFR calc non Af Amer: 52 mL/min — ABNORMAL LOW (ref >60)
Glucose, Bld: 96 mg/dL (ref 65–99)
Potassium: 3.7 mmol/L (ref 3.5–5.1)
Sodium: 136 mmol/L (ref 135–145)

## 2016-12-31 NOTE — Telephone Encounter (Signed)
error    This encounter was created in error - please disregard.

## 2016-12-31 NOTE — Telephone Encounter (Signed)
error 

## 2016-12-31 NOTE — Telephone Encounter (Signed)
errorneous

## 2017-01-01 ENCOUNTER — Encounter: Payer: Self-pay | Admitting: *Deleted

## 2017-01-01 ENCOUNTER — Inpatient Hospital Stay: Payer: Commercial Managed Care - HMO | Admitting: Certified Registered"

## 2017-01-01 ENCOUNTER — Telehealth: Payer: Self-pay | Admitting: Urology

## 2017-01-01 ENCOUNTER — Encounter
Admission: RE | Disposition: A | Discharge status: Home / Self Care | Payer: Self-pay | Source: Ambulatory Visit | Attending: Urology

## 2017-01-01 ENCOUNTER — Inpatient Hospital Stay
Admission: RE | Admit: 2017-01-01 | Discharge: 2017-01-02 | DRG: 658 | Disposition: A | Discharge status: Home / Self Care | Payer: Commercial Managed Care - HMO | Source: Ambulatory Visit | Attending: Urology | Admitting: Urology

## 2017-01-01 DIAGNOSIS — E669 Obesity, unspecified: Secondary | ICD-10-CM | POA: Diagnosis present

## 2017-01-01 DIAGNOSIS — I10 Essential (primary) hypertension: Secondary | ICD-10-CM | POA: Diagnosis present

## 2017-01-01 DIAGNOSIS — F17210 Nicotine dependence, cigarettes, uncomplicated: Secondary | ICD-10-CM | POA: Diagnosis present

## 2017-01-01 DIAGNOSIS — Z885 Allergy status to narcotic agent status: Secondary | ICD-10-CM | POA: Diagnosis not present

## 2017-01-01 DIAGNOSIS — C642 Malignant neoplasm of left kidney, except renal pelvis: Principal | ICD-10-CM | POA: Diagnosis present

## 2017-01-01 DIAGNOSIS — Z905 Acquired absence of kidney: Secondary | ICD-10-CM | POA: Insufficient documentation

## 2017-01-01 DIAGNOSIS — N281 Cyst of kidney, acquired: Secondary | ICD-10-CM | POA: Diagnosis present

## 2017-01-01 DIAGNOSIS — Z6833 Body mass index (BMI) 33.0-33.9, adult: Secondary | ICD-10-CM

## 2017-01-01 HISTORY — PX: LAPAROSCOPIC NEPHRECTOMY, HAND ASSISTED: SHX1929

## 2017-01-01 LAB — ABO/RH: ABO/RH(D): A POS

## 2017-01-01 SURGERY — NEPHRECTOMY, HAND-ASSISTED, LAPAROSCOPIC
Anesthesia: General | Site: Abdomen | Laterality: Left | Wound class: Clean

## 2017-01-01 MED ORDER — DEXAMETHASONE SODIUM PHOSPHATE 10 MG/ML IJ SOLN
INTRAMUSCULAR | Status: DC | PRN
  Administered 2017-01-01: 4 mg via INTRAVENOUS

## 2017-01-01 MED ORDER — PROPOFOL 10 MG/ML IV BOLUS
INTRAVENOUS | Status: AC
  Filled 2017-01-01: qty 20

## 2017-01-01 MED ORDER — BUPIVACAINE LIPOSOME 1.3 % IJ SUSP
INTRAMUSCULAR | Status: DC | PRN
  Administered 2017-01-01: 20 mL

## 2017-01-01 MED ORDER — ONDANSETRON HCL 4 MG/2ML IJ SOLN
INTRAMUSCULAR | Status: DC | PRN
  Administered 2017-01-01: 4 mg via INTRAVENOUS

## 2017-01-01 MED ORDER — MORPHINE SULFATE (PF) 2 MG/ML IV SOLN: 2.0000 mg | INTRAVENOUS | Status: DC | PRN

## 2017-01-01 MED ORDER — FAMOTIDINE 20 MG PO TABS
20.0000 mg | ORAL_TABLET | Freq: Once | ORAL | Status: AC
  Administered 2017-01-01: 20 mg via ORAL

## 2017-01-01 MED ORDER — CEFAZOLIN SODIUM-DEXTROSE 2-3 GM-% IV SOLR
2.0000 g | Freq: Three times a day (TID) | INTRAVENOUS | Status: AC
  Administered 2017-01-01 – 2017-01-02 (×3): 2 g via INTRAVENOUS
  Filled 2017-01-01 (×3): qty 50

## 2017-01-01 MED ORDER — BENAZEPRIL HCL 20 MG PO TABS
20.0000 mg | ORAL_TABLET | Freq: Every day | ORAL | Status: DC
  Filled 2017-01-01: qty 1

## 2017-01-01 MED ORDER — SUCCINYLCHOLINE CHLORIDE 20 MG/ML IJ SOLN
INTRAMUSCULAR | Status: DC | PRN
  Administered 2017-01-01: 110 mg via INTRAVENOUS

## 2017-01-01 MED ORDER — THROMBIN 5000 UNITS EX SOLR
CUTANEOUS | Status: DC | PRN
  Administered 2017-01-01: 5000 [IU] via TOPICAL

## 2017-01-01 MED ORDER — BUPIVACAINE HCL 0.5 % IJ SOLN
INTRAMUSCULAR | Status: DC | PRN
  Administered 2017-01-01: 20 mL

## 2017-01-01 MED ORDER — FAMOTIDINE 20 MG PO TABS
ORAL_TABLET | ORAL | Status: AC
  Filled 2017-01-01: qty 1

## 2017-01-01 MED ORDER — LACTATED RINGERS IV SOLN
INTRAVENOUS | Status: DC
  Administered 2017-01-01: 07:00:00 via INTRAVENOUS

## 2017-01-01 MED ORDER — HYDROCODONE-ACETAMINOPHEN 5-325 MG PO TABS: 1.0000 | ORAL_TABLET | ORAL | 0 refills | Status: DC | PRN

## 2017-01-01 MED ORDER — DOCUSATE SODIUM 100 MG PO CAPS
100.0000 mg | ORAL_CAPSULE | Freq: Two times a day (BID) | ORAL | Status: DC
  Administered 2017-01-01 – 2017-01-02 (×3): 100 mg via ORAL
  Filled 2017-01-01 (×3): qty 1

## 2017-01-01 MED ORDER — SUCCINYLCHOLINE CHLORIDE 200 MG/10ML IV SOSY
PREFILLED_SYRINGE | INTRAVENOUS | Status: AC
  Filled 2017-01-01: qty 10

## 2017-01-01 MED ORDER — SODIUM CHLORIDE 0.9 % IJ SOLN
INTRAMUSCULAR | Status: AC
  Filled 2017-01-01: qty 50

## 2017-01-01 MED ORDER — AMLODIPINE BESYLATE 5 MG PO TABS
5.0000 mg | ORAL_TABLET | Freq: Every day | ORAL | Status: DC
  Administered 2017-01-01 – 2017-01-02 (×2): 5 mg via ORAL
  Filled 2017-01-01 (×2): qty 1

## 2017-01-01 MED ORDER — SODIUM CHLORIDE 0.9 % IV SOLN
INTRAVENOUS | Status: DC
  Administered 2017-01-01 – 2017-01-02 (×3): via INTRAVENOUS

## 2017-01-01 MED ORDER — FENTANYL CITRATE (PF) 250 MCG/5ML IJ SOLN
INTRAMUSCULAR | Status: AC
  Filled 2017-01-01: qty 5

## 2017-01-01 MED ORDER — CITALOPRAM HYDROBROMIDE 20 MG PO TABS
20.0000 mg | ORAL_TABLET | Freq: Every day | ORAL | Status: DC
  Administered 2017-01-01: 20 mg via ORAL
  Filled 2017-01-01: qty 1

## 2017-01-01 MED ORDER — ATENOLOL 100 MG PO TABS
100.0000 mg | ORAL_TABLET | Freq: Every day | ORAL | Status: DC
  Administered 2017-01-02: 100 mg via ORAL
  Filled 2017-01-01: qty 1

## 2017-01-01 MED ORDER — BUPIVACAINE HCL (PF) 0.5 % IJ SOLN
INTRAMUSCULAR | Status: AC
  Filled 2017-01-01: qty 30

## 2017-01-01 MED ORDER — FENTANYL CITRATE (PF) 100 MCG/2ML IJ SOLN
INTRAMUSCULAR | Status: AC
  Administered 2017-01-01: 12:00:00
  Filled 2017-01-01: qty 2

## 2017-01-01 MED ORDER — GLYCOPYRROLATE 0.2 MG/ML IJ SOLN
INTRAMUSCULAR | Status: DC | PRN
  Administered 2017-01-01: 0.2 mg via INTRAVENOUS

## 2017-01-01 MED ORDER — HYDROCHLOROTHIAZIDE 25 MG PO TABS
25.0000 mg | ORAL_TABLET | Freq: Every day | ORAL | Status: DC
  Administered 2017-01-01 – 2017-01-02 (×2): 25 mg via ORAL
  Filled 2017-01-01 (×2): qty 1

## 2017-01-01 MED ORDER — CEFAZOLIN SODIUM-DEXTROSE 2-4 GM/100ML-% IV SOLN: 2.0000 g | Freq: Three times a day (TID) | INTRAVENOUS | Status: DC

## 2017-01-01 MED ORDER — SUGAMMADEX SODIUM 200 MG/2ML IV SOLN
INTRAVENOUS | Status: DC | PRN
  Administered 2017-01-01: 200 mg via INTRAVENOUS

## 2017-01-01 MED ORDER — FENTANYL CITRATE (PF) 100 MCG/2ML IJ SOLN
INTRAMUSCULAR | Status: DC | PRN
  Administered 2017-01-01 (×2): 25 ug via INTRAVENOUS
  Administered 2017-01-01: 50 ug via INTRAVENOUS
  Administered 2017-01-01: 150 ug via INTRAVENOUS

## 2017-01-01 MED ORDER — LIDOCAINE HCL (PF) 2 % IJ SOLN
INTRAMUSCULAR | Status: AC
  Filled 2017-01-01: qty 2

## 2017-01-01 MED ORDER — FENTANYL CITRATE (PF) 100 MCG/2ML IJ SOLN
25.0000 ug | INTRAMUSCULAR | Status: DC | PRN
  Administered 2017-01-01 (×4): 25 ug via INTRAVENOUS

## 2017-01-01 MED ORDER — ROCURONIUM BROMIDE 50 MG/5ML IV SOSY
PREFILLED_SYRINGE | INTRAVENOUS | Status: AC
  Filled 2017-01-01: qty 5

## 2017-01-01 MED ORDER — ACETAMINOPHEN 10 MG/ML IV SOLN
1000.0000 mg | Freq: Four times a day (QID) | INTRAVENOUS | Status: AC
  Administered 2017-01-01 – 2017-01-02 (×3): 1000 mg via INTRAVENOUS
  Filled 2017-01-01 (×5): qty 100

## 2017-01-01 MED ORDER — LIDOCAINE HCL (CARDIAC) 20 MG/ML IV SOLN
INTRAVENOUS | Status: DC | PRN
  Administered 2017-01-01: 80 mg via INTRAVENOUS

## 2017-01-01 MED ORDER — BENAZEPRIL-HYDROCHLOROTHIAZIDE 20-25 MG PO TABS: 1.0000 | ORAL_TABLET | Freq: Every day | ORAL | Status: DC

## 2017-01-01 MED ORDER — ROCURONIUM BROMIDE 100 MG/10ML IV SOLN
INTRAVENOUS | Status: DC | PRN
  Administered 2017-01-01: 5 mg via INTRAVENOUS
  Administered 2017-01-01: 45 mg via INTRAVENOUS
  Administered 2017-01-01: 20 mg via INTRAVENOUS

## 2017-01-01 MED ORDER — SODIUM CHLORIDE 0.9 % IV SOLN
INTRAVENOUS | Status: DC | PRN
  Administered 2017-01-01: 20 ug/min via INTRAVENOUS

## 2017-01-01 MED ORDER — MIDAZOLAM HCL 2 MG/2ML IJ SOLN
INTRAMUSCULAR | Status: AC
  Filled 2017-01-01: qty 2

## 2017-01-01 MED ORDER — HYDROCODONE-ACETAMINOPHEN 5-325 MG PO TABS
1.0000 | ORAL_TABLET | ORAL | Status: DC | PRN
  Administered 2017-01-01 – 2017-01-02 (×2): 2 via ORAL
  Filled 2017-01-01 (×2): qty 2

## 2017-01-01 MED ORDER — DEXAMETHASONE SODIUM PHOSPHATE 10 MG/ML IJ SOLN
INTRAMUSCULAR | Status: AC
  Filled 2017-01-01: qty 1

## 2017-01-01 MED ORDER — ONDANSETRON HCL 4 MG/2ML IJ SOLN
4.0000 mg | INTRAMUSCULAR | Status: DC | PRN
  Administered 2017-01-02: 4 mg via INTRAVENOUS
  Filled 2017-01-01: qty 2

## 2017-01-01 MED ORDER — ONDANSETRON HCL 4 MG/2ML IJ SOLN: 4.0000 mg | Freq: Once | INTRAMUSCULAR | Status: DC | PRN

## 2017-01-01 MED ORDER — SUGAMMADEX SODIUM 200 MG/2ML IV SOLN
INTRAVENOUS | Status: AC
  Filled 2017-01-01: qty 2

## 2017-01-01 MED ORDER — PHENYLEPHRINE HCL 10 MG/ML IJ SOLN: INTRAMUSCULAR | Status: DC | PRN

## 2017-01-01 MED ORDER — THROMBIN 5000 UNITS EX SOLR
CUTANEOUS | Status: AC
  Filled 2017-01-01: qty 5000

## 2017-01-01 MED ORDER — BUPIVACAINE LIPOSOME 1.3 % IJ SUSP
INTRAMUSCULAR | Status: AC
  Filled 2017-01-01: qty 20

## 2017-01-01 MED ORDER — ACETAMINOPHEN 10 MG/ML IV SOLN
INTRAVENOUS | Status: AC
  Filled 2017-01-01: qty 100

## 2017-01-01 MED ORDER — PROPOFOL 10 MG/ML IV BOLUS
INTRAVENOUS | Status: DC | PRN
  Administered 2017-01-01: 180 mg via INTRAVENOUS

## 2017-01-01 MED ORDER — CEFAZOLIN SODIUM-DEXTROSE 2-4 GM/100ML-% IV SOLN
INTRAVENOUS | Status: AC
  Filled 2017-01-01: qty 100

## 2017-01-01 MED ORDER — ACETAMINOPHEN 10 MG/ML IV SOLN
INTRAVENOUS | Status: DC | PRN
  Administered 2017-01-01: 1000 mg via INTRAVENOUS

## 2017-01-01 MED ORDER — PHENYLEPHRINE HCL 10 MG/ML IJ SOLN
INTRAMUSCULAR | Status: AC
  Filled 2017-01-01: qty 1

## 2017-01-01 MED ORDER — HEPARIN SODIUM (PORCINE) 5000 UNIT/ML IJ SOLN
5000.0000 [IU] | Freq: Three times a day (TID) | INTRAMUSCULAR | Status: DC
  Administered 2017-01-01 – 2017-01-02 (×3): 5000 [IU] via SUBCUTANEOUS
  Filled 2017-01-01 (×3): qty 1

## 2017-01-01 MED ORDER — CEFAZOLIN SODIUM-DEXTROSE 2-4 GM/100ML-% IV SOLN
2.0000 g | INTRAVENOUS | Status: AC
  Administered 2017-01-01: 2 g via INTRAVENOUS

## 2017-01-01 SURGICAL SUPPLY — 81 items
ANCHOR TIS RET SYS 1550ML (BAG) ×2 IMPLANT
APPLICATOR SURGIFLO (MISCELLANEOUS) ×2 IMPLANT
APPLIER CLIP ROT 13.4 12 LRG (CLIP) ×2
BNDG COHESIVE 4X5 TAN STRL (GAUZE/BANDAGES/DRESSINGS) ×2 IMPLANT
CANISTER SUCT 1200ML W/VALVE (MISCELLANEOUS) ×2 IMPLANT
CATH TRAY 16F METER LATEX (MISCELLANEOUS) ×2 IMPLANT
CLEANER CAUTERY TIP 5X5 PAD (MISCELLANEOUS) ×1 IMPLANT
CLIP APPLIE ROT 13.4 12 LRG (CLIP) ×1 IMPLANT
CORD BIP STRL DISP 12FT (MISCELLANEOUS) ×2 IMPLANT
DEFOGGER SCOPE WARMER CLEARIFY (MISCELLANEOUS) ×2 IMPLANT
DISSECTOR KITTNER STICK (MISCELLANEOUS) IMPLANT
DISSECTORS/KITTNER STICK (MISCELLANEOUS)
DRAPE INCISE IOBAN 66X45 STRL (DRAPES) ×2 IMPLANT
DRAPE SURG 17X11 SM STRL (DRAPES) ×8 IMPLANT
DRSG TEGADERM 2-3/8X2-3/4 SM (GAUZE/BANDAGES/DRESSINGS) IMPLANT
DRSG TEGADERM 4X4.75 (GAUZE/BANDAGES/DRESSINGS) IMPLANT
DRSG TELFA 3X8 NADH (GAUZE/BANDAGES/DRESSINGS) IMPLANT
ELECT REM PT RETURN 9FT ADLT (ELECTROSURGICAL) ×2
ELECTRODE REM PT RTRN 9FT ADLT (ELECTROSURGICAL) ×1 IMPLANT
GELPORT LAPAROSCOPIC (MISCELLANEOUS) ×2 IMPLANT
GLOVE BIO SURGEON STRL SZ 6.5 (GLOVE) ×2 IMPLANT
GLOVE INDICATOR 6.5 STRL GRN (GLOVE) ×2 IMPLANT
GOWN STRL REUS W/ TWL LRG LVL3 (GOWN DISPOSABLE) ×3 IMPLANT
GOWN STRL REUS W/TWL LRG LVL3 (GOWN DISPOSABLE) ×3
GRASPER SUT TROCAR 14GX15 (MISCELLANEOUS) ×2 IMPLANT
HANDLE YANKAUER SUCT BULB TIP (MISCELLANEOUS) ×2 IMPLANT
HEMOSTAT SURGICEL 2X3 (HEMOSTASIS) ×2 IMPLANT
HOLDER FOLEY CATH W/STRAP (MISCELLANEOUS) ×2 IMPLANT
IRRIGATION STRYKERFLOW (MISCELLANEOUS) ×1 IMPLANT
IRRIGATOR STRYKERFLOW (MISCELLANEOUS) ×2
IV NS 1000ML (IV SOLUTION) ×1
IV NS 1000ML BAXH (IV SOLUTION) ×1 IMPLANT
KIT PINK PAD W/HEAD ARE REST (MISCELLANEOUS) ×2
KIT PINK PAD W/HEAD ARM REST (MISCELLANEOUS) ×1 IMPLANT
KIT RM TURNOVER STRD PROC AR (KITS) ×2 IMPLANT
L-HOOK LAP DISP 36CM (ELECTROSURGICAL)
LABEL OR SOLS (LABEL) ×2 IMPLANT
LHOOK LAP DISP 36CM (ELECTROSURGICAL) IMPLANT
LIGASURE LAP ATLAS 10MM 37CM (INSTRUMENTS) ×2 IMPLANT
LIGASURE MARYLAND LAP STAND (ELECTROSURGICAL) ×2 IMPLANT
LIQUID BAND (GAUZE/BANDAGES/DRESSINGS) ×2 IMPLANT
LOOP RED MAXI  1X406MM (MISCELLANEOUS) ×1
LOOP VESSEL MAXI 1X406 RED (MISCELLANEOUS) ×1 IMPLANT
NEEDLE HYPO 25X1 1.5 SAFETY (NEEDLE) ×2 IMPLANT
NEEDLE INSUFFLATION 14GA 120MM (NEEDLE) ×2 IMPLANT
PACK LAP CHOLECYSTECTOMY (MISCELLANEOUS) ×2 IMPLANT
PAD CLEANER CAUTERY TIP 5X5 (MISCELLANEOUS) ×1
PENCIL ELECTRO HAND CTR (MISCELLANEOUS) ×2 IMPLANT
RELOAD STAPLE SKIN SM 35W (MISCELLANEOUS) ×2 IMPLANT
SCISSORS METZENBAUM CVD 33 (INSTRUMENTS) ×2 IMPLANT
SHEARS HARMONIC ACE PLUS 36CM (ENDOMECHANICALS) IMPLANT
SOL ANTI-FOG 6CC FOG-OUT (MISCELLANEOUS) ×1 IMPLANT
SOL FOG-OUT ANTI-FOG 6CC (MISCELLANEOUS) ×1
SPOGE SURGIFLO 8M (HEMOSTASIS) ×1
SPONGE LAP 18X18 5 PK (GAUZE/BANDAGES/DRESSINGS) ×4 IMPLANT
SPONGE SURGIFLO 8M (HEMOSTASIS) ×1 IMPLANT
STAPLE RELOAD 2.5MM WHITE (STAPLE) ×12 IMPLANT
STAPLER VASCULAR ECHELON 35 (CUTTER) ×2 IMPLANT
STRIP CLOSURE SKIN 1/2X4 (GAUZE/BANDAGES/DRESSINGS) ×2 IMPLANT
SURGILUBE 2OZ TUBE FLIPTOP (MISCELLANEOUS) ×2 IMPLANT
SUT CHROMIC 0 CT 1 (SUTURE) ×2 IMPLANT
SUT MNCRL 4-0 (SUTURE) ×2
SUT MNCRL 4-0 27XMFL (SUTURE) ×2
SUT MNCRL AB 4-0 PS2 18 (SUTURE) ×4 IMPLANT
SUT PDS AB 0 CT1 27 (SUTURE) ×4 IMPLANT
SUT VIC AB 0 CT1 18XCR BRD 8 (SUTURE) ×1 IMPLANT
SUT VIC AB 0 CT1 36 (SUTURE) ×4 IMPLANT
SUT VIC AB 0 CT1 8-18 (SUTURE) ×1
SUT VIC AB 1 CT1 36 (SUTURE) ×8 IMPLANT
SUT VIC AB 2-0 SH 27 (SUTURE) ×2
SUT VIC AB 2-0 SH 27XBRD (SUTURE) ×2 IMPLANT
SUT VIC AB 2-0 UR6 27 (SUTURE) ×4 IMPLANT
SUT VIC AB 3-0 SH 27 (SUTURE) ×1
SUT VIC AB 3-0 SH 27X BRD (SUTURE) ×1 IMPLANT
SUT VIC AB 4-0 FS2 27 (SUTURE) ×4 IMPLANT
SUTURE MNCRL 4-0 27XMF (SUTURE) ×2 IMPLANT
TROCAR ENDOPATH XCEL 12X100 BL (ENDOMECHANICALS) ×4 IMPLANT
TROCAR XCEL 12X100 BLDLESS (ENDOMECHANICALS) ×2 IMPLANT
TROCAR XCEL NON-BLD 5MMX100MML (ENDOMECHANICALS) ×4 IMPLANT
TUBING INSUFFLATOR HI FLOW (MISCELLANEOUS) ×2 IMPLANT
WATER STERILE IRR 1000ML POUR (IV SOLUTION) ×2 IMPLANT

## 2017-01-01 NOTE — Op Note (Signed)
Date of procedure: 01/01/17  Preoperative diagnosis:  1. Left renal cell carcinoma   Postoperative diagnosis:  1. Left renal cell carcinoma   Procedure: 1. Laparoscopic hand assisted left radical nephrectomy  Surgeon: Baruch Gouty, MD  Assistant: Hollice Espy, MD  Anesthesia: General  Complications: None  Intraoperative findings: The patient had successful removal of the left kidney and proximal ureter. There is a small disruption of the inferior lateral splenic capsule. This obtained hemostasis on its own. It was also covered with Surgiflo and Surgicel.  EBL: 50 cc  Specimens: Left kidney to pathology  Drains: 16 French Foley catheter to dependent drainage  Disposition: Stable to the postanesthesia care unit  Indication for procedure: The patient is a 66 y.o. female with an incidental finding of a left 1.4 cm medial Bosniak 3 renal cyst. She underwent percutaneous biopsy of this lesion which showed likely left clear cell renal cell carcinoma. Due to the tumor's location, it was not amenable to partial nephrectomy. She presents today for left radical nephrectomy.  After reviewing the management options for treatment, the patient elected to proceed with the above surgical procedure(s). We have discussed the potential benefits and risks of the procedure, side effects of the proposed treatment, the likelihood of the patient achieving the goals of the procedure, and any potential problems that might occur during the procedure or recuperation. Informed consent has been obtained.  Description of procedure: The patient was met in the preoperative area. All risks, benefits, and indications of the procedure were described in great detail. The patient consented to the procedure. Preoperative antibiotics were given. The patient was taken to the operative theater. General anesthesia was induced per the anesthesia service. A Foley catheter was placed along with an OG tube. The patient was then  placed in the right dorsal lithotomy (left side up) position. All pressure points were properly padded. She was prepped and draped in the usual sterile fashion. A preoperative timeout was called.   An 8 cm midline incision was made superior to the umbilicus. Electrocautery was then used to dissect through subcutaneous fat until fascia was encountered. The 8 cm incision was then made with in the fascia. The peritoneum was then encountered. It was opened with Metzenbaum scissors to a total size of 8 cm. A hand port was then placed with care to ensure that there is no bowel entrapped between the port and the abdominal wall. The abdomen was insufflated to 15 mmHg. There were some lesions in the inferior aspect of her abdomen on laparoscopy the port through the HandPort. These were taken down with blunt dissection with the camera. 212 mm ports were then placed in the left lower quadrant. A 5 mm port was also placed in the left upper quadrant which was later used for retraction the kidney. At this point with hand assistance through the hand port the left colon was then mobilized along the white line of Toldt. It was noted this time that there was a small breakdown of the left lateral inferior spleen however this was only the Stone there is no significant bleeding. Hemostasis was obtained without any intervention. At this point the left ureter was encountered inferior to the kidney. The gonadal was also encountered as noted have a small oozing vessel. It was ligated with 2 Wecclips but not incised. The ureter was traced back medially into the left renal vein was encountered.  Utilization was aided by retraction of the kidney from the left upper quadrant port. With careful dissection  the left renal vein and left renal artery were isolated and block. There did not appear to be any accessory vessels on both imaging and on surgical exploration. The left renal artery and vein were taken en bloc with the white vascular staple  load. Hemostasis was excellent. 3 extra staples were then used to free the superior attachments of the kidney. At this point, the remaining inferior attachments and lateral attachments were broken down a mixture of blunt dissection and electrocautery. The ureter was clipped with stainless steel clips and incised. When the kidney was then freed was placed in a specimen bag and removed through the hand port. Hemostasis was excellent. There is no active bleeding. All sponge and needle counts were correct. Surgi-Flo was then placed in the hilum bed as well as on the area of the splenic capsule that was slightly disrupted. Surgicel was also placed on the splenic capsule disruption. There was no evidence of any bleeding when pneumoperitoneum was turned down. This point the 212 mm ports were closed with a 0 Vicryl with the help of a [employer] instrument. The abdomen was desufflated. The midline fascial incision was closed with a #1 Vicryl running from each end and incision and tied in the middle. Subcutaneous tissues reapproximated with 3-0 Vicryl. Skin was reapproximated with 4-0 Monocryl in a running fashion. Skin was placed incisions. Please note that prior to placement and closure of the abdomen that 40 cc of liposomal Marcaine with a mixture of regular Marcaine was placed within the fascial muscle and subcutaneous tissues for local anesthetic. At this point, the patient was awoken from anesthesia and transferred stable condition to postanesthesia care unit.  Dr. Erlene Quan provided necessary surgical assistant throughout the procedure by holding extra instruments and driving the camera. Without her help this case would not have been possible.  Plan: The patient was admitted to the floor for routine postoperative management. She'll follow-up her pathology. We will need to keep an eye on her right renal Bosniak 83F cyst on future imaging.  Baruch Gouty, M.D.

## 2017-01-01 NOTE — Anesthesia Procedure Notes (Signed)
Procedure Name: Intubation Performed by: Lance Muss Pre-anesthesia Checklist: Patient identified, Patient being monitored, Timeout performed, Emergency Drugs available and Suction available Patient Re-evaluated:Patient Re-evaluated prior to inductionOxygen Delivery Method: Circle system utilized Preoxygenation: Pre-oxygenation with 100% oxygen Intubation Type: IV induction Ventilation: Mask ventilation without difficulty Laryngoscope Size: Mac, 3 and McGraph Grade View: Grade IV Tube type: Oral Tube size: 7.0 mm Number of attempts: 2 Airway Equipment and Method: Stylet and Video-laryngoscopy Placement Confirmation: ETT inserted through vocal cords under direct vision,  positive ETCO2 and breath sounds checked- equal and bilateral Secured at: 22 cm Tube secured with: Tape Dental Injury: Teeth and Oropharynx as per pre-operative assessment  Difficulty Due To: Difficult Airway- due to anterior larynx and Difficult Airway- due to reduced neck mobility Future Recommendations: Recommend- induction with short-acting agent, and alternative techniques readily available Comments: Easy MV, first attempt with MAC 3 blade had grade 4 view. Second attempt Mcgrath 3 with grade 2 view. Recommend using video-laryngoscopy first.

## 2017-01-01 NOTE — Anesthesia Preprocedure Evaluation (Signed)
Anesthesia Evaluation  Patient identified by MRN, date of birth, ID band Patient awake    Reviewed: Allergy & Precautions, H&P , NPO status , Patient's Chart, lab work & pertinent test results, reviewed documented beta blocker date and time   Airway Mallampati: II  TM Distance: >3 FB Neck ROM: full    Dental  (+) Teeth Intact   Pulmonary neg pulmonary ROS, Current Smoker,    Pulmonary exam normal        Cardiovascular hypertension, negative cardio ROS Normal cardiovascular exam Rhythm:regular Rate:Normal     Neuro/Psych PSYCHIATRIC DISORDERS negative neurological ROS  negative psych ROS   GI/Hepatic negative GI ROS, Neg liver ROS,   Endo/Other  negative endocrine ROS  Renal/GU CRFRenal diseasenegative Renal ROS  negative genitourinary   Musculoskeletal   Abdominal   Peds  Hematology negative hematology ROS (+)   Anesthesia Other Findings Past Medical History: No date: Abnormal mammogram, unspecified 2004: Aneurysm ([place])     Comment: cerebral x  4, [facility] No date: Anxiety No date: Arthritis 2012: Benign neoplasm of breast 2010: Breast complaint No date: Cancer [facility])     Comment: Renal Cell No date: Chronic kidney disease     Comment: Renal Cell Cancer No date: Hypertension No date: Obesity, unspecified No date: Personal history of tobacco use, presenting ha* No date: Special screening for malignant neoplasms, col* Past Surgical History: 1993: ABDOMINAL HYSTERECTOMY 2012: BREAST BIOPSY Right     Comment: stereo bx showing a 8mm radial scar in an               estimated 8 volume of tissue Apr 09, 2005: BREAST BIOPSY Right     Comment: Fibrocystic changes, ductal adenosis,               microcalcifications, focal stromal/epithelial               proliferation, possible fibroadenoma type               proliferation 2004: CEREBRAL ANEURYSM REPAIR 2008: COLONOSCOPY     Comment: Dr. Nicolasa Ducking No date:  EYE SURGERY 2003: FOOT SURGERY Right 1997: TONSILLECTOMY   Reproductive/Obstetrics negative OB ROS                             Anesthesia Physical Anesthesia Plan  ASA: II  Anesthesia Plan: General ETT   Post-op Pain Management:    Induction:   Airway Management Planned:   Additional Equipment: Arterial line  Intra-op Plan:   Post-operative Plan:   Informed Consent: I have reviewed the patients History and Physical, chart, labs and discussed the procedure including the risks, benefits and alternatives for the proposed anesthesia with the patient or authorized representative who has indicated his/her understanding and acceptance.   Dental Advisory Given  Plan Discussed with: CRNA  Anesthesia Plan Comments:         Anesthesia Quick Evaluation

## 2017-01-01 NOTE — Telephone Encounter (Signed)
done

## 2017-01-01 NOTE — Transfer of Care (Signed)
Immediate Anesthesia Transfer of Care Note  Patient: Sarah [name]  Procedure(s) Performed: Procedure(s): HAND ASSISTED LAPAROSCOPIC NEPHRECTOMY (Left)  Patient Location: PACU  Anesthesia Type:General  Level of Consciousness: sedated and responds to stimulation  Airway & Oxygen Therapy: Patient Spontanous Breathing and Patient connected to face mask oxygen  Post-op Assessment: Report given to RN and Post -op Vital signs reviewed and stable  Post vital signs: Reviewed and stable  Last Vitals:  Vitals:   01/01/17 0625 01/01/17 1109  BP: 139/79 (!) 152/70  Pulse: 69 82  Resp: 18 16  Temp: 36.6 C     Last Pain:  Vitals:   01/01/17 0625  TempSrc: Tympanic         Complications: No apparent anesthesia complications

## 2017-01-01 NOTE — H&P (View-Only) (Signed)
12/18/2016 1:56 PM   Sarah [name] 1950-12-18 [id number]  Referring provider: Kathrine Haddock, NP [place], [place] [address]  Chief Complaint  Patient presents with  . Follow-up    discuss surgery    HPI: The patient is a 66 year old female who presents today for evaluation of a Bosniak 3 left renal cyst. This was found after an incidental finding of a CT of her chest revealed an indeterminate renal lesion.  On review of the imaging, the patient has a medial left Bosniak IIIrenal cyst in very close proximity to the hilum. She also has a lower pole right Bosniak IIF cyst. There is also a Bosniak IIright renal cyst and bilateral Bosniak I renal cysts. The patient has no other urological history. She voids without difficulty. She has history of UTI, nephrolithiasis, andgross hematuria.  She underwent ultrasound-guided aspiration of this left renal mass which showed cells highly suspicious for clear cell renal cell carcinoma.    GFR was 60 with a current of 1.12 five months ago when last checked.  Cytology: DIAGNOSIS:  A. LEFT KIDNEY; ULTRASOUND-GUIDED FINE-NEEDLE ASPIRATION:  - HIGHLY SUSPICIOUS FOR CLEAR CELL RENAL CELL CARCINOMA, SEE COMMENT.   Comment: The specimen was sent to [facility]  [place], [place]) for external consultation. The findings of the  external consultant are incorporated above. This is the note of Dr.  Chesley Mires: Hesitation for making a definite diagnosis of clear cell renal  cell carcinoma is because of the limited amount of atypical cells  present.     She is scheduled for surgery in a few weeks however she returns today for the opportunity to discuss any questions that have arisen since she was first diagnosed with left renal cell carcinoma.   PMH: Past Medical History:  Diagnosis Date  . Abnormal mammogram, unspecified   . Aneurysm ([place])    cerebral x  4  . Benign neoplasm of breast 2012  . Breast complaint 2010    . Hypertension   . Obesity, unspecified   . Personal history of tobacco use, presenting hazards to health   . Special screening for malignant neoplasms, colon     Surgical History: Past Surgical History:  Procedure Laterality Date  . ABDOMINAL HYSTERECTOMY  1993  . BREAST BIOPSY Right 2012   stereo bx showing a 69mm radial scar in an estimated 8 volume of tissue  . BREAST BIOPSY Right Apr 09, 2005   Fibrocystic changes, ductal adenosis, microcalcifications, focal stromal/epithelial proliferation, possible fibroadenoma type proliferation  . CEREBRAL ANEURYSM REPAIR  2004  . COLONOSCOPY  2008   Dr. Nicolasa Ducking  . EYE SURGERY    . FOOT SURGERY  2003  . TONSILLECTOMY  1997    Home Medications:  Allergies as of 12/18/2016      Reactions   Codeine Itching      Medication List       Accurate as of 12/18/16  1:56 PM. Always use your most recent med list.          acetaminophen 325 MG tablet Commonly known as:  TYLENOL Take 650 mg by mouth every 6 (six) hours as needed for mild pain, moderate pain or headache.   amLODipine 5 MG tablet Commonly known as:  NORVASC Take 1 tablet (5 mg total) by mouth daily.   aspirin 81 MG tablet Take 81 mg by mouth daily.   atenolol 100 MG tablet Commonly known as:  TENORMIN Take 1 tablet (100 mg total) by mouth daily.  benazepril-hydrochlorthiazide 20-25 MG tablet Commonly known as:  LOTENSIN HCT Take 1 tablet by mouth daily.   citalopram 20 MG tablet Commonly known as:  CELEXA Take 1 tablet (20 mg total) by mouth at bedtime.   ibuprofen 800 MG tablet Commonly known as:  ADVIL,MOTRIN Take 1 tablet (800 mg total) by mouth every 8 (eight) hours as needed.   multivitamin tablet Take 1 tablet by mouth daily.   VITAMIN D-3 PO Take 600 Units by mouth daily.       Allergies:  Allergies  Allergen Reactions  . Codeine Itching    Family History: Family History  Problem Relation Age of Onset  . Cancer Cousin     maternal first  cousin with breast cancer  . Cancer Other     colon and ovarian cancers, no relationship listed  . Cancer Father     prostate  . Hypertension Sister   . Hypertension Brother   . Hypertension Paternal Grandmother   . Heart disease Paternal Grandmother   . Hypertension Paternal Grandfather   . Heart disease Paternal Grandfather     Social History:  reports that she has been smoking Cigarettes.  She has a 20.00 pack-year smoking history. She has never used smokeless tobacco. She reports that she drinks alcohol. She reports that she does not use drugs.  ROS: UROLOGY Frequent Urination?: No Hard to postpone urination?: No Burning/pain with urination?: No Get up at night to urinate?: No Leakage of urine?: No Urine stream starts and stops?: No Trouble starting stream?: No Do you have to strain to urinate?: No Blood in urine?: No Urinary tract infection?: No Sexually transmitted disease?: No Injury to kidneys or bladder?: No Painful intercourse?: No Weak stream?: No Currently pregnant?: No Vaginal bleeding?: No Last menstrual period?: n  Gastrointestinal Nausea?: No Vomiting?: No Indigestion/heartburn?: No Diarrhea?: No Constipation?: No  Constitutional Fever: No Night sweats?: No Weight loss?: No Fatigue?: No  Skin Skin rash/lesions?: No Itching?: No  Eyes Blurred vision?: No Double vision?: No  Ears/Nose/Throat Sore throat?: No Sinus problems?: No  Hematologic/Lymphatic Swollen glands?: No Easy bruising?: No  Cardiovascular Leg swelling?: No Chest pain?: No  Respiratory Cough?: No Shortness of breath?: No  Endocrine Excessive thirst?: No  Musculoskeletal Back pain?: No Joint pain?: No  Neurological Headaches?: No Dizziness?: No  Psychologic Depression?: No Anxiety?: No  Physical Exam: BP 121/71 (BP Location: Left Arm, Patient Position: Sitting, Cuff Size: Large)   Pulse 71   Ht 5' 7.5" (1.715 m)   Wt 214 lb 12.8 oz (97.4 kg)   BMI  33.15 kg/m   Constitutional:  Alert and oriented, No acute distress. HEENT: [place] AT, moist mucus membranes.  Trachea midline, no masses. Cardiovascular: No clubbing, cyanosis, or edema. Respiratory: Normal respiratory effort, no increased work of breathing. GI: Abdomen is soft, nontender, nondistended, no abdominal masses GU: No CVA tenderness.  Skin: No rashes, bruises or suspicious lesions. Lymph: No cervical or inguinal adenopathy. Neurologic: Grossly intact, no focal deficits, moving all 4 extremities. Psychiatric: Normal mood and affect.  Laboratory Data: Lab Results  Component Value Date   WBC 7.3 11/09/2016   HGB 11.9 (L) 11/09/2016   HCT 34.1 (L) 11/09/2016   MCV 87.4 11/09/2016   PLT 313 11/09/2016    Lab Results  Component Value Date   CREATININE 1.05 (H) 10/15/2016    No results found for: PSA  No results found for: TESTOSTERONE  No results found for: HGBA1C  Urinalysis No results found for: COLORURINE, APPEARANCEUR,  LABSPEC, PHURINE, GLUCOSEU, HGBUR, BILIRUBINUR, KETONESUR, PROTEINUR, UROBILINOGEN, NITRITE, LEUKOCYTESUR  Pertinent Imaging: Left renal mass as above on MRI  Assessment & Plan:    1. Left RCC I again discussed with the patient her diagnosis of left RCC with a very medial tumor. Begin discussed in great detail a left laparoscopic hand-assisted nephrectomy. As we discussed last time we also discussed the risks, benefits, indications procedure. She had the opportunity this appointment to ask any more questions that have arisen since her last appointment. All questions were answered at this time. She agrees to proceed. She is scheduled for surgery in approximately 2 weeks.   Nickie Retort, MD  [facility] [address] [place], [place] [address] [phone]

## 2017-01-01 NOTE — Interval H&P Note (Signed)
History and Physical Interval Note:  01/01/2017 7:24 AM  Sarah [name]  has presented today for surgery, with the diagnosis of LEFT RENAL CELL CARCINOMA  The various methods of treatment have been discussed with the patient and family. After consideration of risks, benefits and other options for treatment, the patient has consented to  Procedure(s): HAND ASSISTED LAPAROSCOPIC NEPHRECTOMY (Left) as a surgical intervention .  The patient's history has been reviewed, patient examined, no change in status, stable for surgery.  I have reviewed the patient's chart and labs.  Questions were answered to the patient's satisfaction.    RRR Lungs clear   Nickie Retort

## 2017-01-01 NOTE — Anesthesia Post-op Follow-up Note (Cosign Needed)
Anesthesia QCDR form completed.        

## 2017-01-02 LAB — CBC
HCT: 28.6 % — ABNORMAL LOW (ref 35.0–47.0)
Hemoglobin: 9.8 g/dL — ABNORMAL LOW (ref 12.0–16.0)
MCH: 30 pg (ref 26.0–34.0)
MCHC: 34.3 g/dL (ref 32.0–36.0)
MCV: 87.5 fL (ref 80.0–100.0)
PLATELETS: 397 10*3/uL (ref 150–440)
RBC: 3.27 MIL/uL — AB (ref 3.80–5.20)
RDW: 14 % (ref 11.5–14.5)
WBC: 10.8 10*3/uL (ref 3.6–11.0)

## 2017-01-02 LAB — BASIC METABOLIC PANEL
Anion gap: 9 (ref 5–15)
BUN: 22 mg/dL — ABNORMAL HIGH (ref 6–20)
CO2: 24 mmol/L (ref 22–32)
Calcium: 8.6 mg/dL — ABNORMAL LOW (ref 8.9–10.3)
Chloride: 103 mmol/L (ref 101–111)
Creatinine, Ser: 1.76 mg/dL — ABNORMAL HIGH (ref 0.44–1.00)
GFR calc non Af Amer: 29 mL/min — ABNORMAL LOW (ref >60)
GFR, EST AFRICAN AMERICAN: 34 mL/min — AB (ref >60)
Glucose, Bld: 113 mg/dL — ABNORMAL HIGH (ref 65–99)
POTASSIUM: 3.9 mmol/L (ref 3.5–5.1)
SODIUM: 136 mmol/L (ref 135–145)

## 2017-01-02 MED ORDER — SENNOSIDES-DOCUSATE SODIUM 8.6-50 MG PO TABS: 1.0000 | ORAL_TABLET | Freq: Two times a day (BID) | ORAL | 0 refills | Status: DC

## 2017-01-02 NOTE — Progress Notes (Signed)
Spoke with Dr.Manny regarding IV tylenol scheduled for 12 MN and 6 AM as patient is already at 6.5 grams. Dr.Manny  verbalized to hold midnight dose and give 6 am dose.  Terrial Rhodes

## 2017-01-02 NOTE — Discharge Summary (Signed)
Physician Discharge Summary  Patient ID: Sarah [name] MRN: [id number] DOB/AGE: 1951-10-19 66 y.o.  Admit date: 01/01/2017 Discharge date: 01/02/2017  Admission Diagnoses: LEFT renal mass  Discharge Diagnoses:  Active Problems:   Renal cancer, left [facility])   Discharged Condition: good  Hospital Course:   1 - LEFT Renal Mass - pt underwent laparoscopic LEFT radial nephrectomy for endophytic renal mass on 01/01/17, the day of admission, without acute complication. By POD 1 she is ambulatory, maintaining PO hydration, pain controlled on oral meds, and felt to be adequate for discharge. CR at DC 1.7, Hgb 9.8, pathology penidng.   Consults: None  Significant Diagnostic Studies: labs: as per above  Treatments: surgery: LEFT radial nephrectomy for endophytic renal mass on 01/01/17  Discharge Exam: Blood pressure 137/71, pulse 74, temperature 98.7 F (37.1 C), temperature source Oral, resp. rate 20, height 5' 7.5" (1.715 m), weight 97.6 kg (215 lb 1.6 oz), SpO2 96 %. General appearance: alert, cooperative, appears stated age and family at bedside Eyes: negative Nose: Nares normal. Septum midline. Mucosa normal. No drainage or sinus tenderness. Throat: lips, mucosa, and tongue normal; teeth and gums normal Neck: supple, symmetrical, trachea midline [name]: symmetric, no curvature. ROM normal. No CVA tenderness. Resp: non-labored on room air.  Cardio: normal rate GI: soft, non-tender; bowel sounds normal; no masses,  no organomegaly Extremities: extremities normal, atraumatic, no cyanosis or edema Pulses: 2+ and symmetric Lymph nodes: Cervical, supraclavicular, and axillary nodes normal. Neurologic: Grossly normal Incision/Wound: recent port and extraction sties c/d/i. No hernias / large ecchymoses / hematomas.   Disposition: 01-Home or Self Care   Allergies as of 01/02/2017      Reactions   Codeine Itching      Medication List    STOP taking these medications   aspirin 81 MG  tablet   ibuprofen 800 MG tablet Commonly known as:  ADVIL,MOTRIN     TAKE these medications   acetaminophen 325 MG tablet Commonly known as:  TYLENOL Take 650 mg by mouth every 6 (six) hours as needed for mild pain, moderate pain or headache.   amLODipine 5 MG tablet Commonly known as:  NORVASC Take 1 tablet (5 mg total) by mouth daily.   atenolol 100 MG tablet Commonly known as:  TENORMIN Take 1 tablet (100 mg total) by mouth daily.   benazepril-hydrochlorthiazide 20-25 MG tablet Commonly known as:  LOTENSIN HCT Take 1 tablet by mouth daily.   citalopram 20 MG tablet Commonly known as:  CELEXA Take 1 tablet (20 mg total) by mouth at bedtime.   HYDROcodone-acetaminophen 5-325 MG tablet Commonly known as:  NORCO Take 1-2 tablets by mouth every 4 (four) hours as needed for moderate pain.   multivitamin tablet Take 1 tablet by mouth daily.   senna-docusate 8.6-50 MG tablet Commonly known as:  Senokot-S Take 1 tablet by mouth 2 (two) times daily.   VITAMIN D-3 PO Take 600 Units by mouth daily.      Follow-up [place], MD Follow up in 1 week(s).   Specialty:  Urology Contact information: [address] [place] [address] [phone]           Signed: Alexis Frock 01/02/2017, 10:09 AM

## 2017-01-02 NOTE — Discharge Instructions (Signed)
°   Stiches - Your stitches are all dissolvable. You may notice a "loose thread" at your incisions, these are normal and require no intervention. You may cut them flush to the skin with fingernail clippers if needed for comfort.   Diet - No restrictions   Activity - No heavy lifting / straining (any activities that require valsalva or "bearing down") x 4 weeks. Otherwise, no restrictions.   Bathing - You may shower immediately. Do not take a bath or get into swimming pool where incision sites are submersed in water x 4 weeks.    When to Call the Doctor - Call MD for any fever >102, any acute wound problems, or any severe nausea / vomiting. It will roll-over to the answering service and on-call physician after hours.

## 2017-01-04 ENCOUNTER — Other Ambulatory Visit: Payer: Self-pay | Admitting: Pathology

## 2017-01-04 LAB — SURGICAL PATHOLOGY

## 2017-01-06 NOTE — Anesthesia Postprocedure Evaluation (Signed)
Anesthesia Post Note  Patient: Sarah [name]  Procedure(s) Performed: Procedure(s) (LRB): HAND ASSISTED LAPAROSCOPIC NEPHRECTOMY (Left)  Patient location during evaluation: PACU Anesthesia Type: General Level of consciousness: awake and alert Pain management: pain level controlled Vital Signs Assessment: post-procedure vital signs reviewed and stable Respiratory status: spontaneous breathing, nonlabored ventilation, respiratory function stable and patient connected to nasal cannula oxygen Cardiovascular status: blood pressure returned to baseline and stable Postop Assessment: no signs of nausea or vomiting Anesthetic complications: no     Last Vitals:  Vitals:   01/02/17 0504 01/02/17 0803  BP: 130/65 137/71  Pulse: 83 74  Resp: 20 20  Temp: 36.8 C 37.1 C    Last Pain:  Vitals:   01/02/17 1033  TempSrc:   PainSc: [place]

## 2017-01-15 ENCOUNTER — Encounter: Payer: Self-pay | Admitting: Urology

## 2017-01-15 ENCOUNTER — Ambulatory Visit: Payer: 59 | Admitting: Urology

## 2017-01-15 VITALS — BP 119/68 | HR 106 | Ht 67.5 in | Wt 202.1 lb

## 2017-01-15 DIAGNOSIS — C642 Malignant neoplasm of left kidney, except renal pelvis: Secondary | ICD-10-CM

## 2017-01-15 NOTE — Progress Notes (Signed)
01/15/2017 1:54 PM   Sarah [name] Aug 22, 1951 [id number]  Referring provider: Kathrine Haddock, NP [place],  [address]  Chief Complaint  Patient presents with  . Follow-up    Pathology results    HPI: The patient is a 66 year old female presents today for follow-up after undergoing a left radical laparoscopic hand-assisted nephrectomy on 01/01/2017. She is doing well status post surgery. Her pain is improved. She is having bowel movements. She has no nausea or vomiting. She is tolerating diet. Her incisions are healing well without drainage.  Pathology: Multifocal clear cell renal cell carcinoma. 3 distinct tumors were seen. 2 were cystic and the third was solid. There was tumor extension into the renal sidedness. Margins were negative. Lymphovascular invasion was not identified. Histologic grade 2. Greatest dimension was 2.1 cm. Stage pT3a.   PMH: Past Medical History:  Diagnosis Date  . Abnormal mammogram, unspecified   . Aneurysm ([place]) 2004   cerebral x  4, [facility]  . Anxiety   . Arthritis   . Benign neoplasm of breast 2012  . Breast complaint 2010  . Cancer ([place])    Renal Cell  . Chronic kidney disease    Renal Cell Cancer  . Hypertension   . Obesity, unspecified   . Personal history of tobacco use, presenting hazards to health   . Special screening for malignant neoplasms, colon     Surgical History: Past Surgical History:  Procedure Laterality Date  . ABDOMINAL HYSTERECTOMY  1993  . BREAST BIOPSY Right 2012   stereo bx showing a 64mm radial scar in an estimated 8 volume of tissue  . BREAST BIOPSY Right Apr 09, 2005   Fibrocystic changes, ductal adenosis, microcalcifications, focal stromal/epithelial proliferation, possible fibroadenoma type proliferation  . CEREBRAL ANEURYSM REPAIR  2004  . COLONOSCOPY  2008   Dr. Nicolasa Ducking  . EYE SURGERY    . FOOT SURGERY Right 2003  . LAPAROSCOPIC NEPHRECTOMY, HAND ASSISTED Left 01/01/2017   Procedure: HAND  ASSISTED LAPAROSCOPIC NEPHRECTOMY;  Surgeon: Nickie Retort, MD;  Location: [facility] ORS;  Service: Urology;  Laterality: Left;  . TONSILLECTOMY  1997    Home Medications:  Allergies as of 01/15/2017      Reactions   Codeine Itching      Medication List       Accurate as of 01/15/17  1:54 PM. Always use your most recent med list.          acetaminophen 325 MG tablet Commonly known as:  TYLENOL Take 650 mg by mouth every 6 (six) hours as needed for mild pain, moderate pain or headache.   amLODipine 5 MG tablet Commonly known as:  NORVASC Take 1 tablet (5 mg total) by mouth daily.   atenolol 100 MG tablet Commonly known as:  TENORMIN Take 1 tablet (100 mg total) by mouth daily.   benazepril-hydrochlorthiazide 20-25 MG tablet Commonly known as:  LOTENSIN HCT Take 1 tablet by mouth daily.   citalopram 20 MG tablet Commonly known as:  CELEXA Take 1 tablet (20 mg total) by mouth at bedtime.   HYDROcodone-acetaminophen 5-325 MG tablet Commonly known as:  NORCO Take 1-2 tablets by mouth every 4 (four) hours as needed for moderate pain.   multivitamin tablet Take 1 tablet by mouth daily.   senna-docusate 8.6-50 MG tablet Commonly known as:  Senokot-S Take 1 tablet by mouth 2 (two) times daily.   VITAMIN D-3 PO Take 600 Units by mouth daily.  Allergies:  Allergies  Allergen Reactions  . Codeine Itching    Family History: Family History  Problem Relation Age of Onset  . Cancer Cousin     maternal first cousin with breast cancer  . Cancer Other     colon and ovarian cancers, no relationship listed  . Cancer Father     prostate  . Hypertension Father   . Hypertension Sister   . Hypertension Brother   . Hypertension Paternal Grandmother   . Heart disease Paternal Grandmother   . Hypertension Paternal Grandfather   . Heart disease Paternal Grandfather     Social History:  reports that she has been smoking Cigarettes.  She has a 20.00 pack-year  smoking history. She has never used smokeless tobacco. She reports that she drinks alcohol. She reports that she does not use drugs.  ROS: UROLOGY Frequent Urination?: No Hard to postpone urination?: No Burning/pain with urination?: No Get up at night to urinate?: No Leakage of urine?: No Urine stream starts and stops?: No Trouble starting stream?: No Do you have to strain to urinate?: No Blood in urine?: No Urinary tract infection?: No Sexually transmitted disease?: No Injury to kidneys or bladder?: No Painful intercourse?: No Weak stream?: No Currently pregnant?: No Vaginal bleeding?: No Last menstrual period?: n  Gastrointestinal Nausea?: No Vomiting?: No Indigestion/heartburn?: No Diarrhea?: No Constipation?: No  Constitutional Fever: No Night sweats?: No Weight loss?: No Fatigue?: No  Skin Skin rash/lesions?: No Itching?: No  Eyes Blurred vision?: No Double vision?: No  Ears/Nose/Throat Sore throat?: No Sinus problems?: No  Hematologic/Lymphatic Swollen glands?: No Easy bruising?: No  Cardiovascular Leg swelling?: No Chest pain?: No  Respiratory Cough?: No Shortness of breath?: No  Endocrine Excessive thirst?: No  Musculoskeletal Back pain?: No Joint pain?: No  Neurological Headaches?: No Dizziness?: No  Psychologic Depression?: No Anxiety?: No  Physical Exam: BP 119/68 (BP Location: Left Arm, Patient Position: Sitting, Cuff Size: Large)   Pulse (!) 106   Ht 5' 7.5" (1.715 m)   Wt 202 lb 1.6 oz (91.7 kg)   BMI 31.19 kg/m   Constitutional:  Alert and oriented, No acute distress. HEENT: [place] AT, moist mucus membranes.  Trachea midline, no masses. Cardiovascular: No clubbing, cyanosis, or edema. Respiratory: Normal respiratory effort, no increased work of breathing. GI: Abdomen is soft, nontender, nondistended, no abdominal masses GU: No CVA tenderness. Incisions healing well. Clean dry and intact. No hernias. Skin: No rashes,  bruises or suspicious lesions. Lymph: No cervical or inguinal adenopathy. Neurologic: Grossly intact, no focal deficits, moving all 4 extremities. Psychiatric: Normal mood and affect.  Laboratory Data: Lab Results  Component Value Date   WBC 10.8 01/02/2017   HGB 9.8 (L) 01/02/2017   HCT 28.6 (L) 01/02/2017   MCV 87.5 01/02/2017   PLT 397 01/02/2017    Lab Results  Component Value Date   CREATININE 1.76 (H) 01/02/2017    No results found for: PSA  No results found for: TESTOSTERONE  No results found for: HGBA1C  Urinalysis    Component Value Date/Time   APPEARANCEUR Cloudy (A) 12/18/2016 1340   GLUCOSEU Negative 12/18/2016 1340   BILIRUBINUR Negative 12/18/2016 1340   PROTEINUR 2+ (A) 12/18/2016 1340   NITRITE Negative 12/18/2016 1340   LEUKOCYTESUR Negative 12/18/2016 1340     Assessment & Plan:    I had a long discussion regarding the patient's pathology results. We did discuss that she had negative margins. We did discuss that there were somewhat unexpected findings  including invasion into the renal sinus resulting in upstaging to pT3a as well as being a multifocal clear cell disease. We discussed the next step would be repeat imaging of her chest abdomen and pelvis ayy 3 months as well as checking her urinalysis and kidney function at that time. She understands that if these are normal she will need routine imaging every 6 months for 3 years and annually after that. She does have multifocal clear cell renal cell carcinoma which is often seen in von hippel-lindau disease. I have offered her genetic testing to see if she has VHL. She would like to proceed with this. We also discussed the indication for Sutent for high-risk localized renal cell carcinoma after nephrectomy. She is interested in discussing this further with medical oncology. We will arrange for her to see medical  oncology in the near future.  1. pT3a multifocal clear cell RCC of left kidney -will need  baseline CT chest/abdomen/pelvis in 3 months -check BMP prior to follow up -urinalysis at next visit -follow up in 3 months -referral to medical oncology for possible adjuvant Sutent therapy -referral to genetics for evaluation for von Hippel Lindau  2. Bosniak 60F right renal cyst -will follow on above imaging    Return in about 3 months (around 04/14/2017) for w/ CT chest/abdomen/pelvis and BMP prior.  Nickie Retort, MD  [facility] [address], [place], [place] [address] [phone]

## 2017-01-19 ENCOUNTER — Ambulatory Visit (INDEPENDENT_AMBULATORY_CARE_PROVIDER_SITE_OTHER): Payer: 59 | Admitting: Unknown Physician Specialty

## 2017-01-19 ENCOUNTER — Encounter: Payer: Self-pay | Admitting: Unknown Physician Specialty

## 2017-01-19 VITALS — BP 116/73 | HR 74 | Temp 98.2°F | Wt 202.2 lb

## 2017-01-19 DIAGNOSIS — L03115 Cellulitis of right lower limb: Secondary | ICD-10-CM

## 2017-01-19 DIAGNOSIS — I80299 Phlebitis and thrombophlebitis of other deep vessels of unspecified lower extremity: Secondary | ICD-10-CM | POA: Diagnosis not present

## 2017-01-19 MED ORDER — CLINDAMYCIN HCL 300 MG PO CAPS: 300.0000 mg | ORAL_CAPSULE | Freq: Three times a day (TID) | ORAL | 0 refills | Status: DC

## 2017-01-19 NOTE — Progress Notes (Signed)
   BP 116/73 (BP Location: Left Arm, Patient Position: Sitting, Cuff Size: Large)   Pulse 74   Temp 98.2 F (36.8 C)   Wt 202 lb 3.2 oz (91.7 kg)   SpO2 97%   BMI 31.20 kg/m    Subjective:    Patient ID: Sarah [name], female    DOB: February 10, 1951, 66 y.o.   MRN: [id number]  HPI: Sarah [name] is a 66 y.o. female  Chief Complaint  Patient presents with  . Leg Pain    pt states she pulled a muscle in her right leg about a week and a half ago. Patient also states that she has a knot on the same leg she would like looked at, states she noticed it around the same time as pulling the muscle. States she has been taking tylenol and elevating the leg    Leg Pain Pt has right lower leg pain she though might be related to a pulled muscle.  She states she noted it 1 1/2 weeks ago.  It is painful and getting worse.  She is keeping it elevated with Tylenol.  She is s/p 2 weeks nephrectomy  Relevant past medical, surgical, family and social history reviewed and updated as indicated. Interim medical history since our last visit reviewed. Allergies and medications reviewed and updated.  Review of Systems  Per HPI unless specifically indicated above     Objective:    BP 116/73 (BP Location: Left Arm, Patient Position: Sitting, Cuff Size: Large)   Pulse 74   Temp 98.2 F (36.8 C)   Wt 202 lb 3.2 oz (91.7 kg)   SpO2 97%   BMI 31.20 kg/m   Wt Readings from Last 3 Encounters:  01/19/17 202 lb 3.2 oz (91.7 kg)  01/15/17 202 lb 1.6 oz (91.7 kg)  01/01/17 215 lb 1.6 oz (97.6 kg)    Physical Exam  Constitutional: She is oriented to person, place, and time. She appears well-developed and well-nourished. No distress.  HENT:  Head: Normocephalic and atraumatic.  Eyes: Conjunctivae and lids are normal. Right eye exhibits no discharge. Left eye exhibits no discharge. No scleral icterus.  Neck: Normal range of motion. Neck supple. No JVD present. Carotid bruit is not present.  Cardiovascular:  Normal rate, regular rhythm and normal heart sounds.   Pulmonary/Chest: Effort normal and breath sounds normal.  Abdominal: Normal appearance. There is no splenomegaly or hepatomegaly.  Musculoskeletal: Normal range of motion.  Neurological: She is alert and oriented to person, place, and time.  Skin: Skin is warm, dry and intact. No rash noted. No pallor.  Psychiatric: She has a normal mood and affect. Her behavior is normal. Judgment and thought content normal.       Assessment & Plan:   Problem List Items Addressed This Visit    None    Visit Diagnoses    Phlebitis and thombophlb of deep vessels of unsp low extrm ([place])    -  Primary   Phlebitis right lower leg with secondary infection.   Discussed with Dr. Jeananne Rama who also saw the pt.  Start Clindamyacin   Cellulitis of right lower extremity           Follow up plan: Return in about 1 day (around 01/20/2017).

## 2017-01-20 ENCOUNTER — Encounter: Payer: Self-pay | Admitting: Unknown Physician Specialty

## 2017-01-20 ENCOUNTER — Ambulatory Visit (INDEPENDENT_AMBULATORY_CARE_PROVIDER_SITE_OTHER): Payer: 59 | Admitting: Unknown Physician Specialty

## 2017-01-20 VITALS — BP 109/62 | HR 96 | Temp 98.4°F | Wt 202.4 lb

## 2017-01-20 DIAGNOSIS — L03115 Cellulitis of right lower limb: Secondary | ICD-10-CM

## 2017-01-20 NOTE — Progress Notes (Signed)
   BP 109/62 (BP Location: Left Arm, Patient Position: Sitting, Cuff Size: Large)   Pulse 96   Temp 98.4 F (36.9 C)   Wt 202 lb 6.4 oz (91.8 kg)   SpO2 96%   BMI 31.23 kg/m    Subjective:    Patient ID: Sarah [name], female    DOB: 1951-04-14, 66 y.o.   MRN: [id number]  HPI: [name] is a 66 y.o. female  Chief Complaint  Patient presents with  . Cellulitis    leg, 1 day f/up   Cellulitis Pt with left lower leg inflammation and swelling here for recheck from yesterday.  She states she has less pain.  Tolerating antibiotics well  Relevant past medical, surgical, family and social history reviewed and updated as indicated. Interim medical history since our last visit reviewed. Allergies and medications reviewed and updated.  Review of Systems  Per HPI unless specifically indicated above     Objective:    BP 109/62 (BP Location: Left Arm, Patient Position: Sitting, Cuff Size: Large)   Pulse 96   Temp 98.4 F (36.9 C)   Wt 202 lb 6.4 oz (91.8 kg)   SpO2 96%   BMI 31.23 kg/m   Wt Readings from Last 3 Encounters:  01/20/17 202 lb 6.4 oz (91.8 kg)  01/19/17 202 lb 3.2 oz (91.7 kg)  01/15/17 202 lb 1.6 oz (91.7 kg)    Physical Exam  Constitutional: She is oriented to person, place, and time. She appears well-developed and well-nourished. No distress.  HENT:  Head: Normocephalic and atraumatic.  Eyes: Conjunctivae and lids are normal. Right eye exhibits no discharge. Left eye exhibits no discharge. No scleral icterus.  Cardiovascular: Normal rate.   Pulmonary/Chest: Effort normal.  Abdominal: Normal appearance. There is no splenomegaly or hepatomegaly.  Musculoskeletal: Normal range of motion.  Left lower leg with continued redness but seems less inflamed and tender since yesterday  Neurological: She is alert and oriented to person, place, and time.  Skin: Skin is intact. No rash noted. No pallor.  Psychiatric: She has a normal mood and affect. Her behavior  is normal. Judgment and thought content normal.     Assessment & Plan:   Problem List Items Addressed This Visit    None    Visit Diagnoses    Cellulitis of right lower extremity    -  Primary   Mild improvement.  Recheck 5 days.  Continue present antbiotics       Follow up plan: Return in about 5 days (around 01/25/2017).

## 2017-01-25 ENCOUNTER — Ambulatory Visit (INDEPENDENT_AMBULATORY_CARE_PROVIDER_SITE_OTHER): Payer: 59 | Admitting: Unknown Physician Specialty

## 2017-01-25 ENCOUNTER — Encounter: Payer: Self-pay | Admitting: Unknown Physician Specialty

## 2017-01-25 VITALS — BP 100/65 | HR 89 | Temp 98.3°F | Wt 201.0 lb

## 2017-01-25 DIAGNOSIS — L03115 Cellulitis of right lower limb: Secondary | ICD-10-CM | POA: Diagnosis not present

## 2017-01-25 NOTE — Progress Notes (Signed)
   BP 100/65 (BP Location: Left Arm, Patient Position: Sitting, Cuff Size: Large)   Pulse 89   Temp 98.3 F (36.8 C)   Wt 201 lb (91.2 kg)   SpO2 98%   BMI 31.02 kg/m    Subjective:    Patient ID: Sarah [name], female    DOB: 05-Jul-1951, 66 y.o.   MRN: [id number]  HPI: Sarah [name] is a 66 y.o. female  Chief Complaint  Patient presents with  . Cellulitis    1 week f/up   Cellulitis follow-up Patient presents for 1 week follow up for right lower leg inflammation treated with Clindamycin. States symptoms have resolved completely and she is able to ambulate without any difficulty. Denies fever/chills, pain, erythema, warmth, swelling, drainage.   Relevant past medical, surgical, family and social history reviewed and updated as indicated. Interim medical history since our last visit reviewed. Allergies and medications reviewed and updated.  Review of Systems  Per HPI unless specifically indicated above     Objective:    BP 100/65 (BP Location: Left Arm, Patient Position: Sitting, Cuff Size: Large)   Pulse 89   Temp 98.3 F (36.8 C)   Wt 201 lb (91.2 kg)   SpO2 98%   BMI 31.02 kg/m   Wt Readings from Last 3 Encounters:  01/25/17 201 lb (91.2 kg)  01/20/17 202 lb 6.4 oz (91.8 kg)  01/19/17 202 lb 3.2 oz (91.7 kg)    Physical Exam  Constitutional: She is oriented to person, place, and time. She appears well-developed and well-nourished. No distress.  HENT:  Head: Normocephalic and atraumatic.  Eyes: Conjunctivae are normal. Right eye exhibits no discharge. Left eye exhibits no discharge. No scleral icterus.  Neck: Normal range of motion. Neck supple. No JVD present.  Cardiovascular: Normal rate, regular rhythm, normal heart sounds and intact distal pulses.   Pulmonary/Chest: Effort normal and breath sounds normal. No respiratory distress. She has no wheezes. She has no rales.  Abdominal: Soft. Bowel sounds are normal.  Musculoskeletal: Normal range of motion.    Neurological: She is alert and oriented to person, place, and time.  Skin: Skin is warm and dry.  No erythema, swelling, or tenderness over right lower extremity.   Psychiatric: She has a normal mood and affect. Her behavior is normal. Judgment and thought content normal.       Assessment & Plan:   Problem List Items Addressed This Visit    None    Visit Diagnoses    Cellulitis of right lower extremity    -  Primary   Resolved. Finish remaining Clindamycin and return to clinic if symptoms return.        Follow up plan: Return if symptoms worsen or fail to improve.

## 2017-01-26 NOTE — Progress Notes (Signed)
[facility]  [phone] [phone]  ID: Sarah [name] OB: December 15, 1950  MR#: [id number]  QTM#:[id number]  Patient Care Team: Kathrine Haddock, NP as PCP - General (Nurse Practitioner) Robert Bellow, MD (General Surgery) Clent Jacks, RN as Registered Nurse  CHIEF COMPLAINT: Stage III renal cell carcinoma, left.  INTERVAL HISTORY: Patient is a 66 year old female who was noted to have a large renal mass on lung screening CT. Subsequent biopsy and left nephrectomy revealed the above stated renal cancer. She currently feels well and is asymptomatic. She has no neurologic complaints. She denies any recent fevers or illnesses. She has good appetite and denies weight loss. She has no chest pain or shortness of breath. She denies any nausea, vomiting, constipation, or diarrhea. She has no urinary complaints. Patient offers no specific complaints today.  REVIEW OF SYSTEMS:   Review of Systems  Constitutional: Negative.  Negative for fever, malaise/fatigue and weight loss.  Respiratory: Negative.  Negative for cough and shortness of breath.   Cardiovascular: Negative.  Negative for chest pain and leg swelling.  Gastrointestinal: Negative.  Negative for abdominal pain.  Genitourinary: Negative.  Negative for flank pain and hematuria.  Neurological: Negative.  Negative for weakness.  Psychiatric/Behavioral: Negative.  The patient is not nervous/anxious.     As per HPI. Otherwise, a complete review of systems is negative.  PAST MEDICAL HISTORY: Past Medical History:  Diagnosis Date  . Abnormal mammogram, unspecified   . Aneurysm ([place]) 2004   cerebral x  4, [facility]  . Anxiety   . Arthritis   . Benign neoplasm of breast 2012  . Breast complaint 2010  . Cancer ([place])    Renal Cell  . Chronic kidney disease    Renal Cell Cancer  . Hypertension   . Obesity, unspecified   . Personal history of tobacco use, presenting hazards to health   .  Special screening for malignant neoplasms, colon     PAST SURGICAL HISTORY: Past Surgical History:  Procedure Laterality Date  . ABDOMINAL HYSTERECTOMY  1993  . BREAST BIOPSY Right 2012   stereo bx showing a 43mm radial scar in an estimated 8 volume of tissue  . BREAST BIOPSY Right Apr 09, 2005   Fibrocystic changes, ductal adenosis, microcalcifications, focal stromal/epithelial proliferation, possible fibroadenoma type proliferation  . CEREBRAL ANEURYSM REPAIR  2004  . COLONOSCOPY  2008   Dr. Nicolasa Ducking  . EYE SURGERY    . FOOT SURGERY Right 2003  . LAPAROSCOPIC NEPHRECTOMY, HAND ASSISTED Left 01/01/2017   Procedure: HAND ASSISTED LAPAROSCOPIC NEPHRECTOMY;  Surgeon: Nickie Retort, MD;  Location: [facility] ORS;  Service: Urology;  Laterality: Left;  . TONSILLECTOMY  1997    FAMILY HISTORY: Family History  Problem Relation Age of Onset  . Cancer Cousin     maternal first cousin with breast cancer  . Cancer Other     colon and ovarian cancers, no relationship listed  . Cancer Father     prostate  . Hypertension Father   . Hypertension Sister   . Hypertension Brother   . Hypertension Paternal Grandmother   . Heart disease Paternal Grandmother   . Hypertension Paternal Grandfather   . Heart disease Paternal Grandfather     ADVANCED DIRECTIVES (Y/N):  N  HEALTH MAINTENANCE: Social History  Substance Use Topics  . Smoking status: Current Every Day Smoker    Packs/day: 0.25    Years: 40.00    Types: Cigarettes  . Smokeless tobacco: Never  Used  . Alcohol use Yes     Comment: pt states she is not currently having any alcohol     Colonoscopy:  PAP:  Bone density:  Lipid panel:  Allergies  Allergen Reactions  . Codeine Itching    Current Outpatient Prescriptions  Medication Sig Dispense Refill  . acetaminophen (TYLENOL) 325 MG tablet Take 650 mg by mouth every 6 (six) hours as needed for mild pain, moderate pain or headache.     Marland Kitchen amLODipine (NORVASC) 5 MG tablet Take 1  tablet (5 mg total) by mouth daily. 90 tablet 1  . atenolol (TENORMIN) 100 MG tablet Take 1 tablet (100 mg total) by mouth daily. 90 tablet 1  . benazepril-hydrochlorthiazide (LOTENSIN HCT) 20-25 MG tablet Take 1 tablet by mouth daily. 90 tablet 1  . Cholecalciferol (VITAMIN D-3 PO) Take 600 Units by mouth daily.    . citalopram (CELEXA) 20 MG tablet Take 1 tablet (20 mg total) by mouth at bedtime. 90 tablet 1  . clindamycin (CLEOCIN) 300 MG capsule Take 1 capsule (300 mg total) by mouth 3 (three) times daily. 30 capsule 0  . Multiple Vitamin (MULTIVITAMIN) tablet Take 1 tablet by mouth daily.    Marland Kitchen HYDROcodone-acetaminophen (NORCO) 5-325 MG tablet Take 1-2 tablets by mouth every 4 (four) hours as needed for moderate pain. (Patient not taking: Reported on 01/27/2017) 30 tablet 0   No current facility-administered medications for this visit.     OBJECTIVE: Vitals:   01/27/17 0848  BP: 137/79  Pulse: 71     Body mass index is 31.26 kg/m.    ECOG FS:0 - Asymptomatic  General: Well-developed, well-nourished, no acute distress. Eyes: Pink conjunctiva, anicteric sclera. HEENT: Normocephalic, moist mucous membranes, clear oropharnyx. Lungs: Clear to auscultation bilaterally. Heart: Regular rate and rhythm. No rubs, murmurs, or gallops. Abdomen: Soft, nontender, nondistended. No organomegaly noted, normoactive bowel sounds. Musculoskeletal: No edema, cyanosis, or clubbing. Neuro: Alert, answering all questions appropriately. Cranial nerves grossly intact. Skin: No rashes or petechiae noted. Psych: Normal affect. Lymphatics: No cervical, calvicular, axillary or inguinal LAD.   LAB RESULTS:  Lab Results  Component Value Date   NA 136 01/02/2017   K 3.9 01/02/2017   CL 103 01/02/2017   CO2 24 01/02/2017   GLUCOSE 113 (H) 01/02/2017   BUN 22 (H) 01/02/2017   CREATININE 1.76 (H) 01/02/2017   CALCIUM 8.6 (L) 01/02/2017   PROT 6.5 06/09/2016   ALBUMIN 3.7 06/09/2016   AST 14 06/09/2016     ALT 10 06/09/2016   ALKPHOS 90 06/09/2016   BILITOT <0.2 06/09/2016   GFRNONAA 29 (L) 01/02/2017   GFRAA 34 (L) 01/02/2017    Lab Results  Component Value Date   WBC 10.8 01/02/2017   NEUTROABS 2.8 04/22/2016   HGB 9.8 (L) 01/02/2017   HCT 28.6 (L) 01/02/2017   MCV 87.5 01/02/2017   PLT 397 01/02/2017     STUDIES: No results found.  ASSESSMENT: Stage III renal cell carcinoma, left.  PLAN:  1. Stage III renal cell carcinoma, left:  According NCCN guidelines, patient will require an abdominal/pelvic CT scan within 3-6 months of her nephrectomy. Will then proceed with abdominal/pelvic CT scan approximately every 6 months for 3 years and then annually after 5 years. Patient will return to clinic in mid May or approximately 3 months after her nephrectomy repeat imaging and further evaluation. 2. Anemia: Mild, monitor. Repeat CBC at next clinic visit. 3. Chronic renal insufficiency: Monitor closely now that patient is status  post nephrectomy.  Approximately 45 minutes is spent in discussion of which greater than 50% was consultation.  Patient expressed understanding and was in agreement with this plan. She also understands that She can call clinic at any time with any questions, concerns, or complaints.   Cancer Staging Renal cancer, left [facility]) Staging form: Kidney, AJCC 8th Edition - Clinical stage from 01/26/2017: Stage III (cT3a, cNX, cM0) - Signed by Lloyd Huger, MD on 01/26/2017   Lloyd Huger, MD   01/31/2017 8:29 AM

## 2017-01-27 ENCOUNTER — Inpatient Hospital Stay: Payer: 59 | Attending: Oncology | Admitting: Oncology

## 2017-01-27 ENCOUNTER — Encounter: Payer: Self-pay | Admitting: Oncology

## 2017-01-27 VITALS — BP 137/79 | HR 71 | Wt 202.6 lb

## 2017-01-27 DIAGNOSIS — N2889 Other specified disorders of kidney and ureter: Secondary | ICD-10-CM

## 2017-01-27 DIAGNOSIS — Z8669 Personal history of other diseases of the nervous system and sense organs: Secondary | ICD-10-CM

## 2017-01-27 DIAGNOSIS — Z800 Family history of malignant neoplasm of digestive organs: Secondary | ICD-10-CM | POA: Diagnosis not present

## 2017-01-27 DIAGNOSIS — D649 Anemia, unspecified: Secondary | ICD-10-CM | POA: Diagnosis not present

## 2017-01-27 DIAGNOSIS — Z86018 Personal history of other benign neoplasm: Secondary | ICD-10-CM | POA: Insufficient documentation

## 2017-01-27 DIAGNOSIS — F419 Anxiety disorder, unspecified: Secondary | ICD-10-CM | POA: Diagnosis not present

## 2017-01-27 DIAGNOSIS — I10 Essential (primary) hypertension: Secondary | ICD-10-CM

## 2017-01-27 DIAGNOSIS — Z8041 Family history of malignant neoplasm of ovary: Secondary | ICD-10-CM

## 2017-01-27 DIAGNOSIS — Z79899 Other long term (current) drug therapy: Secondary | ICD-10-CM | POA: Insufficient documentation

## 2017-01-27 DIAGNOSIS — F17210 Nicotine dependence, cigarettes, uncomplicated: Secondary | ICD-10-CM | POA: Insufficient documentation

## 2017-01-27 DIAGNOSIS — E669 Obesity, unspecified: Secondary | ICD-10-CM

## 2017-01-27 DIAGNOSIS — Z803 Family history of malignant neoplasm of breast: Secondary | ICD-10-CM | POA: Diagnosis not present

## 2017-01-27 DIAGNOSIS — C642 Malignant neoplasm of left kidney, except renal pelvis: Secondary | ICD-10-CM | POA: Diagnosis not present

## 2017-01-27 DIAGNOSIS — M129 Arthropathy, unspecified: Secondary | ICD-10-CM

## 2017-02-08 NOTE — Addendum Note (Signed)
Encounter addended by: Vernie Murders, RT on: 02/08/2017 10:32 AM<BR>    Actions taken: Imaging Exam ended, Charge Capture section accepted

## 2017-04-14 ENCOUNTER — Other Ambulatory Visit: Payer: 59

## 2017-04-14 DIAGNOSIS — C642 Malignant neoplasm of left kidney, except renal pelvis: Secondary | ICD-10-CM | POA: Diagnosis not present

## 2017-04-15 LAB — BASIC METABOLIC PANEL
BUN/Creatinine Ratio: 19 (ref 12–28)
BUN: 28 mg/dL — AB (ref 8–27)
CALCIUM: 9.1 mg/dL (ref 8.7–10.3)
CO2: 22 mmol/L (ref 18–29)
Chloride: 105 mmol/L (ref 96–106)
Creatinine, Ser: 1.51 mg/dL — ABNORMAL HIGH (ref 0.57–1.00)
GFR, EST AFRICAN AMERICAN: 42 mL/min/{1.73_m2} — AB (ref >59)
GFR, EST NON AFRICAN AMERICAN: 36 mL/min/{1.73_m2} — AB (ref >59)
Glucose: 94 mg/dL (ref 65–99)
Potassium: 4.1 mmol/L (ref 3.5–5.2)
Sodium: 145 mmol/L — ABNORMAL HIGH (ref 134–144)

## 2017-04-16 ENCOUNTER — Inpatient Hospital Stay: Payer: 59 | Attending: Oncology

## 2017-04-16 ENCOUNTER — Ambulatory Visit
Admission: RE | Admit: 2017-04-16 | Discharge: 2017-04-16 | Disposition: A | Discharge status: Home / Self Care | Payer: Commercial Managed Care - HMO | Source: Ambulatory Visit | Attending: Urology | Admitting: Urology

## 2017-04-16 ENCOUNTER — Ambulatory Visit: Payer: 59

## 2017-04-16 DIAGNOSIS — Z8042 Family history of malignant neoplasm of prostate: Secondary | ICD-10-CM | POA: Diagnosis not present

## 2017-04-16 DIAGNOSIS — Z803 Family history of malignant neoplasm of breast: Secondary | ICD-10-CM | POA: Diagnosis not present

## 2017-04-16 DIAGNOSIS — I700 Atherosclerosis of aorta: Secondary | ICD-10-CM | POA: Insufficient documentation

## 2017-04-16 DIAGNOSIS — Z87891 Personal history of nicotine dependence: Secondary | ICD-10-CM | POA: Diagnosis not present

## 2017-04-16 DIAGNOSIS — Z800 Family history of malignant neoplasm of digestive organs: Secondary | ICD-10-CM | POA: Insufficient documentation

## 2017-04-16 DIAGNOSIS — Z905 Acquired absence of kidney: Secondary | ICD-10-CM | POA: Insufficient documentation

## 2017-04-16 DIAGNOSIS — E669 Obesity, unspecified: Secondary | ICD-10-CM | POA: Diagnosis not present

## 2017-04-16 DIAGNOSIS — I129 Hypertensive chronic kidney disease with stage 1 through stage 4 chronic kidney disease, or unspecified chronic kidney disease: Secondary | ICD-10-CM | POA: Diagnosis not present

## 2017-04-16 DIAGNOSIS — Z79899 Other long term (current) drug therapy: Secondary | ICD-10-CM | POA: Insufficient documentation

## 2017-04-16 DIAGNOSIS — F419 Anxiety disorder, unspecified: Secondary | ICD-10-CM | POA: Diagnosis not present

## 2017-04-16 DIAGNOSIS — C642 Malignant neoplasm of left kidney, except renal pelvis: Secondary | ICD-10-CM | POA: Diagnosis not present

## 2017-04-16 DIAGNOSIS — D649 Anemia, unspecified: Secondary | ICD-10-CM | POA: Diagnosis not present

## 2017-04-16 DIAGNOSIS — N189 Chronic kidney disease, unspecified: Secondary | ICD-10-CM | POA: Diagnosis not present

## 2017-04-16 DIAGNOSIS — N281 Cyst of kidney, acquired: Secondary | ICD-10-CM | POA: Diagnosis not present

## 2017-04-16 DIAGNOSIS — F17210 Nicotine dependence, cigarettes, uncomplicated: Secondary | ICD-10-CM | POA: Insufficient documentation

## 2017-04-16 DIAGNOSIS — K5730 Diverticulosis of large intestine without perforation or abscess without bleeding: Secondary | ICD-10-CM | POA: Insufficient documentation

## 2017-04-16 DIAGNOSIS — Z8041 Family history of malignant neoplasm of ovary: Secondary | ICD-10-CM | POA: Insufficient documentation

## 2017-04-16 DIAGNOSIS — M129 Arthropathy, unspecified: Secondary | ICD-10-CM | POA: Diagnosis not present

## 2017-04-16 HISTORY — DX: Malignant neoplasm of left kidney, except renal pelvis: C64.2

## 2017-04-16 LAB — CBC WITH DIFFERENTIAL/PLATELET
Basophils Absolute: 0 10*3/uL (ref 0–0.1)
Basophils Relative: 1 %
Eosinophils Absolute: 0.1 10*3/uL (ref 0–0.7)
Eosinophils Relative: 2 %
HEMATOCRIT: 28.8 % — AB (ref 35.0–47.0)
Hemoglobin: 10 g/dL — ABNORMAL LOW (ref 12.0–16.0)
LYMPHS ABS: 2.2 10*3/uL (ref 1.0–3.6)
LYMPHS PCT: 32 %
MCH: 30.5 pg (ref 26.0–34.0)
MCHC: 34.7 g/dL (ref 32.0–36.0)
MCV: 87.8 fL (ref 80.0–100.0)
Monocytes Absolute: 0.6 10*3/uL (ref 0.2–0.9)
Monocytes Relative: 8 %
NEUTROS ABS: 4 10*3/uL (ref 1.4–6.5)
Neutrophils Relative %: 57 %
Platelets: 280 10*3/uL (ref 150–440)
RBC: 3.28 MIL/uL — AB (ref 3.80–5.20)
RDW: 15.5 % — ABNORMAL HIGH (ref 11.5–14.5)
WBC: 7 10*3/uL (ref 3.6–11.0)

## 2017-04-16 LAB — COMPREHENSIVE METABOLIC PANEL
ALT: 10 U/L — AB (ref 14–54)
AST: 14 U/L — ABNORMAL LOW (ref 15–41)
Albumin: 3.7 g/dL (ref 3.5–5.0)
Alkaline Phosphatase: 81 U/L (ref 38–126)
Anion gap: 5 (ref 5–15)
BUN: 38 mg/dL — ABNORMAL HIGH (ref 6–20)
CO2: 23 mmol/L (ref 22–32)
Calcium: 9.1 mg/dL (ref 8.9–10.3)
Chloride: 111 mmol/L (ref 101–111)
Creatinine, Ser: 1.56 mg/dL — ABNORMAL HIGH (ref 0.44–1.00)
GFR, EST AFRICAN AMERICAN: 39 mL/min — AB (ref >60)
GFR, EST NON AFRICAN AMERICAN: 34 mL/min — AB (ref >60)
Glucose, Bld: 91 mg/dL (ref 65–99)
Potassium: 3.9 mmol/L (ref 3.5–5.1)
Sodium: 139 mmol/L (ref 135–145)
Total Bilirubin: 0.4 mg/dL (ref 0.3–1.2)
Total Protein: 7.3 g/dL (ref 6.5–8.1)

## 2017-04-16 MED ORDER — IOPAMIDOL (ISOVUE-370) INJECTION 76%
75.0000 mL | Freq: Once | INTRAVENOUS | Status: AC | PRN
  Administered 2017-04-16: 75 mL via INTRAVENOUS

## 2017-04-18 NOTE — Progress Notes (Signed)
[place]  Telephone:([phone]) [phone] Fax:(336) [phone]  ID: Sarah [name] OB: 1951/02/26  MR#: [id number]  JJK#:[id number]  Patient Care Team: Kathrine Haddock, NP as PCP - General (Nurse Practitioner) Bary Castilla Forest Gleason, MD (General Surgery) Clent Jacks, RN as Registered Nurse  CHIEF COMPLAINT: Stage III renal cell carcinoma, left.  INTERVAL HISTORY: Patient returns to clinic today for further evaluation and discussion of her imaging results. She currently feels well and is asymptomatic. She has no neurologic complaints. She denies any recent fevers or illnesses. She has a good appetite and denies weight loss. She has no chest pain or shortness of breath. She denies any nausea, vomiting, constipation, or diarrhea. She has no urinary complaints. Patient offers no specific complaints today.  REVIEW OF SYSTEMS:   Review of Systems  Constitutional: Negative.  Negative for fever, malaise/fatigue and weight loss.  Respiratory: Negative.  Negative for cough and shortness of breath.   Cardiovascular: Negative.  Negative for chest pain and leg swelling.  Gastrointestinal: Negative.  Negative for abdominal pain.  Genitourinary: Negative.  Negative for flank pain and hematuria.  Musculoskeletal: Negative.   Skin: Negative.  Negative for rash.  Neurological: Negative.  Negative for weakness.  Psychiatric/Behavioral: Negative.  The patient is not nervous/anxious.     As per HPI. Otherwise, a complete review of systems is negative.  PAST MEDICAL HISTORY: Past Medical History:  Diagnosis Date  . Abnormal mammogram, unspecified   . Aneurysm ([place]) 2004   cerebral x  4, [facility]  . Anxiety   . Arthritis   . Benign neoplasm of breast 2012  . Breast complaint 2010  . Chronic kidney disease    Renal Cell Cancer  . Hypertension   . Obesity, unspecified   . Personal history of tobacco use, presenting hazards to health   . Renal cancer, left ([name]) 11/2016   Renal Cell with Left nephrectomy.  Marland Kitchen Special screening for malignant neoplasms, colon     PAST SURGICAL HISTORY: Past Surgical History:  Procedure Laterality Date  . ABDOMINAL HYSTERECTOMY  1993  . BREAST BIOPSY Right 2012   stereo bx showing a 21mm radial scar in an estimated 8 volume of tissue  . BREAST BIOPSY Right Apr 09, 2005   Fibrocystic changes, ductal adenosis, microcalcifications, focal stromal/epithelial proliferation, possible fibroadenoma type proliferation  . CEREBRAL ANEURYSM REPAIR  2004  . COLONOSCOPY  2008   Dr. Nicolasa Ducking  . EYE SURGERY    . FOOT SURGERY Right 2003  . LAPAROSCOPIC NEPHRECTOMY, HAND ASSISTED Left 01/01/2017   Procedure: HAND ASSISTED LAPAROSCOPIC NEPHRECTOMY;  Surgeon: Nickie Retort, MD;  Location: [facility] ORS;  Service: Urology;  Laterality: Left;  . TONSILLECTOMY  1997    FAMILY HISTORY: Family History  Problem Relation Age of Onset  . Cancer Cousin        maternal first cousin with breast cancer  . Cancer Other        colon and ovarian cancers, no relationship listed  . Cancer Father        prostate  . Hypertension Father   . Hypertension Sister   . Hypertension Brother   . Hypertension Paternal Grandmother   . Heart disease Paternal Grandmother   . Hypertension Paternal Grandfather   . Heart disease Paternal Grandfather     ADVANCED DIRECTIVES (Y/N):  N  HEALTH MAINTENANCE: Social History  Substance Use Topics  . Smoking status: Current Every Day Smoker    Packs/day: 0.25    Years: 40.00  Types: Cigarettes  . Smokeless tobacco: Never Used  . Alcohol use Yes     Comment: pt states she is not currently having any alcohol     Colonoscopy:  PAP:  Bone density:  Lipid panel:  Allergies  Allergen Reactions  . Codeine Itching    Current Outpatient Prescriptions  Medication Sig Dispense Refill  . acetaminophen (TYLENOL) 325 MG tablet Take 650 mg by mouth every 6 (six) hours as needed for mild pain, moderate pain or headache.      Marland Kitchen amLODipine (NORVASC) 5 MG tablet Take 1 tablet (5 mg total) by mouth daily. 90 tablet 1  . atenolol (TENORMIN) 100 MG tablet Take 1 tablet (100 mg total) by mouth daily. 90 tablet 1  . benazepril-hydrochlorthiazide (LOTENSIN HCT) 20-25 MG tablet Take 1 tablet by mouth daily. 90 tablet 1  . Cholecalciferol (VITAMIN D-3 PO) Take 600 Units by mouth daily.    . citalopram (CELEXA) 20 MG tablet Take 1 tablet (20 mg total) by mouth at bedtime. 90 tablet 1  . clindamycin (CLEOCIN) 300 MG capsule Take 1 capsule (300 mg total) by mouth 3 (three) times daily. 30 capsule 0  . HYDROcodone-acetaminophen (NORCO) 5-325 MG tablet Take 1-2 tablets by mouth every 4 (four) hours as needed for moderate pain. 30 tablet 0  . Multiple Vitamin (MULTIVITAMIN) tablet Take 1 tablet by mouth daily.     No current facility-administered medications for this visit.     OBJECTIVE: Vitals:   04/19/17 1127  BP: 135/74  Pulse: (!) 58  Resp: 20  Temp: (!) 96.8 F (36 C)     Body mass index is 32.33 kg/m.    ECOG FS:0 - Asymptomatic  General: Well-developed, well-nourished, no acute distress. Eyes: Pink conjunctiva, anicteric sclera. Lungs: Clear to auscultation bilaterally. Heart: Regular rate and rhythm. No rubs, murmurs, or gallops. Abdomen: Soft, nontender, nondistended. No organomegaly noted, normoactive bowel sounds. Musculoskeletal: No edema, cyanosis, or clubbing. Neuro: Alert, answering all questions appropriately. Cranial nerves grossly intact. Skin: No rashes or petechiae noted. Psych: Normal affect.   LAB RESULTS:  Lab Results  Component Value Date   NA 139 04/16/2017   K 3.9 04/16/2017   CL 111 04/16/2017   CO2 23 04/16/2017   GLUCOSE 91 04/16/2017   BUN 38 (H) 04/16/2017   CREATININE 1.56 (H) 04/16/2017   CALCIUM 9.1 04/16/2017   PROT 7.3 04/16/2017   ALBUMIN 3.7 04/16/2017   AST 14 (L) 04/16/2017   ALT 10 (L) 04/16/2017   ALKPHOS 81 04/16/2017   BILITOT 0.4 04/16/2017   GFRNONAA  34 (L) 04/16/2017   GFRAA 39 (L) 04/16/2017    Lab Results  Component Value Date   WBC 7.0 04/16/2017   NEUTROABS 4.0 04/16/2017   HGB 10.0 (L) 04/16/2017   HCT 28.8 (L) 04/16/2017   MCV 87.8 04/16/2017   PLT 280 04/16/2017     STUDIES: Ct Abdomen Pelvis W Wo Contrast  Result Date: 04/16/2017 CLINICAL DATA:  Followup left renal cell carcinoma status post left nephrectomy January 2018. Complex cystic right renal lesions. EXAM: CT CHEST WITH CONTRAST CT ABDOMEN AND PELVIS WITH AND WITHOUT CONTRAST TECHNIQUE: Multidetector CT imaging of the chest was performed during intravenous contrast administration. Multidetector CT imaging of the abdomen and pelvis was performed following the standard protocol before and during bolus administration of intravenous contrast. CONTRAST:  75 cc Isovue 370 COMPARISON:  MRI abdomen 10/15/2016 and prior chest CT 09/01/2016 FINDINGS: CT CHEST FINDINGS Cardiovascular: The heart is borderline enlarged  but stable. No pericardial effusion. Mild tortuosity and calcification of the thoracic aorta but no focal aneurysm or dissection. Mediastinum/Nodes: No mediastinal or hilar mass or lymphadenopathy. Small scattered lymph nodes are noted. The esophagus is grossly normal. Lungs/Pleura: No worrisome pulmonary lesions to suggest metastatic disease. No acute pulmonary findings. No pleural effusion. Chest wall/ Musculoskeletal: No old breast masses, supraclavicular or axillary lymphadenopathy. Small scattered lymph nodes are noted. The thyroid gland is grossly normal. CT ABDOMEN AND PELVIS FINDINGS Hepatobiliary: No focal hepatic lesions or intrahepatic biliary dilatation. Suspect gallstones. No common bile duct dilatation. Pancreas: No mass, inflammation or ductal dilatation. Spleen: Normal size.  No focal lesions. Adrenals/Urinary Tract: Low-attenuation right adrenal gland lesion stable since prior examinations and likely benign adrenal gland adenoma. The lesion demonstrates  moderate washout on the delayed images also. The left adrenal gland is normal. The left kidney is surgically absent. Expected postoperative changes without findings for residual or recurrent tumor. Upper pole exophytic right renal lesion measures 40 Hounsfield units precontrast, 46 Hounsfield units on the arterial phase sequence and 45 Hounsfield units on the delayed phase. Findings consistent with a benign hyperdense/ hemorrhagic cyst. There are also several other hyperdense/ hemorrhagic cyst involving the right kidney. The mid pole lesion laterally also demonstrates areas of increased density on the precontrast images measuring up to 68 Hounsfield units. There are also septations. The lesion measures 2.8 x 2.2 cm and is considered a Bosniak 2 F lesion. Stomach/Bowel: The stomach, duodenum, small bowel and colon are grossly normal. No inflammatory changes, mass lesions or obstructive findings. Sigmoid diverticulosis without findings for acute diverticulitis. The terminal ileum and appendix are normal. Vascular/Lymphatic: Stable atherosclerotic calcifications involving the aorta and iliac arteries. No focal aneurysm or dissection. The major venous structures are patent. New line no mesenteric or retroperitoneal mass or adenopathy. Small scattered lymph nodes are stable. Reproductive: The uterus is surgically absent. Both ovaries are still present and appear normal. Other: No pelvic mass or adenopathy. No free pelvic fluid collections. No inguinal mass or adenopathy. No abdominal wall hernia or subcutaneous lesions. Musculoskeletal: No significant bony findings. Moderate degenerative changes involving the spine. IMPRESSION: 1. No CT findings for pulmonary metastatic disease. 2. Status post left nephrectomy. Expected postoperative changes in the left nephrectomy bed. No findings suspicious for recurrent or residual tumor or metastatic disease. 3. Multiple right renal cystic lesions. Several of these are hemorrhagic  cysts. There are also some simple cysts. The mid pole lesion laterally also has septations and is considered a Bosniak 2 F lesion. Recommend followup CT or MRI examination in 6-12 months. Electronically Signed   By: Marijo Sanes M.D.   On: 04/16/2017 11:53   Ct Chest W Contrast  Result Date: 04/16/2017 CLINICAL DATA:  Followup left renal cell carcinoma status post left nephrectomy January 2018. Complex cystic right renal lesions. EXAM: CT CHEST WITH CONTRAST CT ABDOMEN AND PELVIS WITH AND WITHOUT CONTRAST TECHNIQUE: Multidetector CT imaging of the chest was performed during intravenous contrast administration. Multidetector CT imaging of the abdomen and pelvis was performed following the standard protocol before and during bolus administration of intravenous contrast. CONTRAST:  75 cc Isovue 370 COMPARISON:  MRI abdomen 10/15/2016 and prior chest CT 09/01/2016 FINDINGS: CT CHEST FINDINGS Cardiovascular: The heart is borderline enlarged but stable. No pericardial effusion. Mild tortuosity and calcification of the thoracic aorta but no focal aneurysm or dissection. Mediastinum/Nodes: No mediastinal or hilar mass or lymphadenopathy. Small scattered lymph nodes are noted. The esophagus is grossly normal. Lungs/Pleura: No  worrisome pulmonary lesions to suggest metastatic disease. No acute pulmonary findings. No pleural effusion. Chest wall/ Musculoskeletal: No old breast masses, supraclavicular or axillary lymphadenopathy. Small scattered lymph nodes are noted. The thyroid gland is grossly normal. CT ABDOMEN AND PELVIS FINDINGS Hepatobiliary: No focal hepatic lesions or intrahepatic biliary dilatation. Suspect gallstones. No common bile duct dilatation. Pancreas: No mass, inflammation or ductal dilatation. Spleen: Normal size.  No focal lesions. Adrenals/Urinary Tract: Low-attenuation right adrenal gland lesion stable since prior examinations and likely benign adrenal gland adenoma. The lesion demonstrates moderate  washout on the delayed images also. The left adrenal gland is normal. The left kidney is surgically absent. Expected postoperative changes without findings for residual or recurrent tumor. Upper pole exophytic right renal lesion measures 40 Hounsfield units precontrast, 46 Hounsfield units on the arterial phase sequence and 45 Hounsfield units on the delayed phase. Findings consistent with a benign hyperdense/ hemorrhagic cyst. There are also several other hyperdense/ hemorrhagic cyst involving the right kidney. The mid pole lesion laterally also demonstrates areas of increased density on the precontrast images measuring up to 68 Hounsfield units. There are also septations. The lesion measures 2.8 x 2.2 cm and is considered a Bosniak 2 F lesion. Stomach/Bowel: The stomach, duodenum, small bowel and colon are grossly normal. No inflammatory changes, mass lesions or obstructive findings. Sigmoid diverticulosis without findings for acute diverticulitis. The terminal ileum and appendix are normal. Vascular/Lymphatic: Stable atherosclerotic calcifications involving the aorta and iliac arteries. No focal aneurysm or dissection. The major venous structures are patent. New line no mesenteric or retroperitoneal mass or adenopathy. Small scattered lymph nodes are stable. Reproductive: The uterus is surgically absent. Both ovaries are still present and appear normal. Other: No pelvic mass or adenopathy. No free pelvic fluid collections. No inguinal mass or adenopathy. No abdominal wall hernia or subcutaneous lesions. Musculoskeletal: No significant bony findings. Moderate degenerative changes involving the spine. IMPRESSION: 1. No CT findings for pulmonary metastatic disease. 2. Status post left nephrectomy. Expected postoperative changes in the left nephrectomy bed. No findings suspicious for recurrent or residual tumor or metastatic disease. 3. Multiple right renal cystic lesions. Several of these are hemorrhagic cysts.  There are also some simple cysts. The mid pole lesion laterally also has septations and is considered a Bosniak 2 F lesion. Recommend followup CT or MRI examination in 6-12 months. Electronically Signed   By: Marijo Sanes M.D.   On: 04/16/2017 11:53    ASSESSMENT: Stage III renal cell carcinoma, left.  PLAN:  1. Stage III renal cell carcinoma, left:  CT scan results from Apr 16, 2017 reviewed independently and reported as above with no evidence of recurrent or metastatic disease. According NCCN guidelines, patient will require abdominal/pelvic CT scan approximately every 6 months for 3 years after her nephrectomy which occurred on January 01, 2017. She then will require annual imaging for a total of 5 years. Return to clinic in 6 months with repeat CT scan and further evaluation. 2. Anemia: Stable, monitor.  3. Chronic renal insufficiency: Monitor closely now that patient is status post nephrectomy.   Patient expressed understanding and was in agreement with this plan. She also understands that She can call clinic at any time with any questions, concerns, or complaints.   Cancer Staging Renal cancer, left [facility]) Staging form: Kidney, AJCC 8th Edition - Clinical stage from 01/26/2017: Stage III (cT3a, cNX, cM0) - Signed by Lloyd Huger, MD on 01/26/2017   Lloyd Huger, MD   04/19/2017 11:29 AM

## 2017-04-19 ENCOUNTER — Inpatient Hospital Stay (HOSPITAL_BASED_OUTPATIENT_CLINIC_OR_DEPARTMENT_OTHER): Payer: 59 | Admitting: Oncology

## 2017-04-19 ENCOUNTER — Inpatient Hospital Stay: Payer: 59

## 2017-04-19 VITALS — BP 135/74 | HR 58 | Temp 96.8°F | Resp 20 | Wt 209.5 lb

## 2017-04-19 DIAGNOSIS — I700 Atherosclerosis of aorta: Secondary | ICD-10-CM

## 2017-04-19 DIAGNOSIS — I129 Hypertensive chronic kidney disease with stage 1 through stage 4 chronic kidney disease, or unspecified chronic kidney disease: Secondary | ICD-10-CM | POA: Diagnosis not present

## 2017-04-19 DIAGNOSIS — Z87891 Personal history of nicotine dependence: Secondary | ICD-10-CM

## 2017-04-19 DIAGNOSIS — F17210 Nicotine dependence, cigarettes, uncomplicated: Secondary | ICD-10-CM | POA: Diagnosis not present

## 2017-04-19 DIAGNOSIS — K5730 Diverticulosis of large intestine without perforation or abscess without bleeding: Secondary | ICD-10-CM | POA: Diagnosis not present

## 2017-04-19 DIAGNOSIS — Z8042 Family history of malignant neoplasm of prostate: Secondary | ICD-10-CM

## 2017-04-19 DIAGNOSIS — E669 Obesity, unspecified: Secondary | ICD-10-CM

## 2017-04-19 DIAGNOSIS — F419 Anxiety disorder, unspecified: Secondary | ICD-10-CM

## 2017-04-19 DIAGNOSIS — Z803 Family history of malignant neoplasm of breast: Secondary | ICD-10-CM

## 2017-04-19 DIAGNOSIS — Z79899 Other long term (current) drug therapy: Secondary | ICD-10-CM

## 2017-04-19 DIAGNOSIS — C642 Malignant neoplasm of left kidney, except renal pelvis: Secondary | ICD-10-CM

## 2017-04-19 DIAGNOSIS — N189 Chronic kidney disease, unspecified: Secondary | ICD-10-CM | POA: Diagnosis not present

## 2017-04-19 DIAGNOSIS — D649 Anemia, unspecified: Secondary | ICD-10-CM | POA: Diagnosis not present

## 2017-04-19 DIAGNOSIS — Z800 Family history of malignant neoplasm of digestive organs: Secondary | ICD-10-CM

## 2017-04-19 DIAGNOSIS — M129 Arthropathy, unspecified: Secondary | ICD-10-CM

## 2017-04-19 DIAGNOSIS — Z8041 Family history of malignant neoplasm of ovary: Secondary | ICD-10-CM

## 2017-04-19 NOTE — Progress Notes (Signed)
Patient denies any concerns today.  

## 2017-04-28 ENCOUNTER — Encounter: Payer: Self-pay | Admitting: Urology

## 2017-04-28 ENCOUNTER — Ambulatory Visit: Payer: 59 | Admitting: Urology

## 2017-04-28 VITALS — BP 110/64 | HR 67 | Ht 67.5 in | Wt 208.0 lb

## 2017-04-28 DIAGNOSIS — C642 Malignant neoplasm of left kidney, except renal pelvis: Secondary | ICD-10-CM

## 2017-04-28 NOTE — Progress Notes (Signed)
04/28/2017 11:01 AM   Sarah [name] 07-22-1951 [id number]  Referring provider: Kathrine Haddock, NP [address], [place] [address]  Chief Complaint  Patient presents with  . Follow-up    CT resuslts    HPI: The patient is a 66 year old female presents today for follow-up after undergoing a left radical laparoscopic hand-assisted nephrectomy on 01/01/2017. She is doing well status post surgery. She denies any new pain or bones. Her repeat imaging of her CT chest abdomen and pelvis were unremarkable.  Cr stable at 1.56 with GFR stable at 39.  She also notes mild urinary urgency with urge incontinence. She has nocturia 2-3. She feels that she empties her bladder. She is not bothered by this.  Pathology: Multifocal clear cell renal cell carcinoma. 3 distinct tumors were seen. 2 were cystic and the third was solid. There was tumor extension into the renal sidedness. Margins were negative. Lymphovascular invasion was not identified. Histologic grade 2. Greatest dimension was 2.1 cm. Stage pT3a.      PMH: Past Medical History:  Diagnosis Date  . Abnormal mammogram, unspecified   . Aneurysm ([place]) 2004   cerebral x  4, [facility]  . Anxiety   . Arthritis   . Benign neoplasm of breast 2012  . Breast complaint 2010  . Chronic kidney disease    Renal Cell Cancer  . Hypertension   . Obesity, unspecified   . Personal history of tobacco use, presenting hazards to health   . Renal cancer, left ([place]) 11/2016   Renal Cell with Left nephrectomy.  Marland Kitchen Special screening for malignant neoplasms, colon     Surgical History: Past Surgical History:  Procedure Laterality Date  . ABDOMINAL HYSTERECTOMY  1993  . BREAST BIOPSY Right 2012   stereo bx showing a 18mm radial scar in an estimated 8 volume of tissue  . BREAST BIOPSY Right Apr 09, 2005   Fibrocystic changes, ductal adenosis, microcalcifications, focal stromal/epithelial proliferation, possible fibroadenoma type proliferation    . CEREBRAL ANEURYSM REPAIR  2004  . COLONOSCOPY  2008   Dr. Nicolasa Ducking  . EYE SURGERY    . FOOT SURGERY Right 2003  . LAPAROSCOPIC NEPHRECTOMY, HAND ASSISTED Left 01/01/2017   Procedure: HAND ASSISTED LAPAROSCOPIC NEPHRECTOMY;  Surgeon: Nickie Retort, MD;  Location: [facility] ORS;  Service: Urology;  Laterality: Left;  . TONSILLECTOMY  1997    Home Medications:  Allergies as of 04/28/2017      Reactions   Codeine Itching      Medication List       Accurate as of 04/28/17 11:01 AM. Always use your most recent med list.          acetaminophen 325 MG tablet Commonly known as:  TYLENOL Take 650 mg by mouth every 6 (six) hours as needed for mild pain, moderate pain or headache.   amLODipine 5 MG tablet Commonly known as:  NORVASC Take 1 tablet (5 mg total) by mouth daily.   atenolol 100 MG tablet Commonly known as:  TENORMIN Take 1 tablet (100 mg total) by mouth daily.   benazepril-hydrochlorthiazide 20-25 MG tablet Commonly known as:  LOTENSIN HCT Take 1 tablet by mouth daily.   citalopram 20 MG tablet Commonly known as:  CELEXA Take 1 tablet (20 mg total) by mouth at bedtime.   multivitamin tablet Take 1 tablet by mouth daily.   VITAMIN D-3 PO Take 600 Units by mouth daily.       Allergies:  Allergies  Allergen Reactions  .  Codeine Itching    Family History: Family History  Problem Relation Age of Onset  . Cancer Cousin        maternal first cousin with breast cancer  . Cancer Other        colon and ovarian cancers, no relationship listed  . Cancer Father        prostate  . Hypertension Father   . Hypertension Sister   . Hypertension Brother   . Hypertension Paternal Grandmother   . Heart disease Paternal Grandmother   . Hypertension Paternal Grandfather   . Heart disease Paternal Grandfather     Social History:  reports that she has been smoking Cigarettes.  She has a 10.00 pack-year smoking history. She has never used smokeless tobacco. She reports  that she drinks alcohol. She reports that she does not use drugs.  ROS: UROLOGY Frequent Urination?: No Hard to postpone urination?: No Burning/pain with urination?: No Get up at night to urinate?: No Leakage of urine?: No Urine stream starts and stops?: No Trouble starting stream?: No Do you have to strain to urinate?: No Blood in urine?: No Urinary tract infection?: No Sexually transmitted disease?: No Injury to kidneys or bladder?: No Painful intercourse?: No Weak stream?: No Currently pregnant?: No Vaginal bleeding?: No Last menstrual period?: n  Gastrointestinal Nausea?: No Vomiting?: No Indigestion/heartburn?: No Diarrhea?: No Constipation?: No  Constitutional Fever: No Night sweats?: No Weight loss?: No Fatigue?: No  Skin Skin rash/lesions?: No Itching?: No  Eyes Blurred vision?: No Double vision?: No  Ears/Nose/Throat Sore throat?: No Sinus problems?: No  Hematologic/Lymphatic Swollen glands?: No Easy bruising?: No  Cardiovascular Leg swelling?: No Chest pain?: No  Respiratory Cough?: No Shortness of breath?: No  Endocrine Excessive thirst?: No  Musculoskeletal Back pain?: No Joint pain?: No  Neurological Headaches?: No Dizziness?: No  Psychologic Depression?: No Anxiety?: No  Physical Exam: BP 110/64 (BP Location: Left Arm, Patient Position: Sitting, Cuff Size: Normal)   Pulse 67   Ht 5' 7.5" (1.715 m)   Wt 208 lb (94.3 kg)   BMI 32.10 kg/m   Constitutional:  Alert and oriented, No acute distress. HEENT: [place] AT, moist mucus membranes.  Trachea midline, no masses. Cardiovascular: No clubbing, cyanosis, or edema. Respiratory: Normal respiratory effort, no increased work of breathing. GI: Abdomen is soft, nontender, nondistended, no abdominal masses GU: No CVA tenderness.  Skin: No rashes, bruises or suspicious lesions. Lymph: No cervical or inguinal adenopathy. Neurologic: Grossly intact, no focal deficits, moving all 4  extremities. Psychiatric: Normal mood and affect.  Laboratory Data: Lab Results  Component Value Date   WBC 7.0 04/16/2017   HGB 10.0 (L) 04/16/2017   HCT 28.8 (L) 04/16/2017   MCV 87.8 04/16/2017   PLT 280 04/16/2017    Lab Results  Component Value Date   CREATININE 1.56 (H) 04/16/2017    No results found for: PSA  No results found for: TESTOSTERONE  No results found for: HGBA1C  Urinalysis    Component Value Date/Time   APPEARANCEUR Cloudy (A) 12/18/2016 1340   GLUCOSEU Negative 12/18/2016 1340   BILIRUBINUR Negative 12/18/2016 1340   PROTEINUR 2+ (A) 12/18/2016 1340   NITRITE Negative 12/18/2016 1340   LEUKOCYTESUR Negative 12/18/2016 1340    Pertinent Imaging: CT chest, abdomen, pelvis shows no findings of pulmonary metastatic disease. She is status post left nephrectomy with expected postoperative changes and no signs of recurrent residual tumor. She does have a stable mid right pole lateral Bosniak 53F cyst.  Assessment &  Plan:  1. pT3a multifocal clear cell RCC of left kidney -no evidence of disease -will need baseline CT abdomen/pelvis and CT or CXR every 6 months for three years then annually after that per American Urological Association guidelines -follow up in 6 months with CT abd/pelvis and CXR prior -check BMP prior to next visit  2. Bosniak 79F right renal cyst -will follow on above imaging  Return for with CT, CXR,  and BMP prior.  Nickie Retort, MD  [facility] [address], [place], [place] [address] [phone]

## 2017-06-22 ENCOUNTER — Encounter: Payer: Self-pay | Admitting: Unknown Physician Specialty

## 2017-06-22 ENCOUNTER — Ambulatory Visit (INDEPENDENT_AMBULATORY_CARE_PROVIDER_SITE_OTHER): Payer: 59 | Admitting: Unknown Physician Specialty

## 2017-06-22 VITALS — BP 126/71 | HR 65 | Temp 98.2°F | Ht 66.2 in | Wt 207.4 lb

## 2017-06-22 DIAGNOSIS — Z23 Encounter for immunization: Secondary | ICD-10-CM

## 2017-06-22 DIAGNOSIS — I10 Essential (primary) hypertension: Secondary | ICD-10-CM | POA: Diagnosis not present

## 2017-06-22 DIAGNOSIS — Z1239 Encounter for other screening for malignant neoplasm of breast: Secondary | ICD-10-CM

## 2017-06-22 DIAGNOSIS — M170 Bilateral primary osteoarthritis of knee: Secondary | ICD-10-CM

## 2017-06-22 DIAGNOSIS — M1710 Unilateral primary osteoarthritis, unspecified knee: Secondary | ICD-10-CM | POA: Insufficient documentation

## 2017-06-22 DIAGNOSIS — Z0000 Encounter for general adult medical examination without abnormal findings: Secondary | ICD-10-CM

## 2017-06-22 DIAGNOSIS — F17200 Nicotine dependence, unspecified, uncomplicated: Secondary | ICD-10-CM

## 2017-06-22 DIAGNOSIS — M179 Osteoarthritis of knee, unspecified: Secondary | ICD-10-CM | POA: Insufficient documentation

## 2017-06-22 DIAGNOSIS — F324 Major depressive disorder, single episode, in partial remission: Secondary | ICD-10-CM

## 2017-06-22 DIAGNOSIS — Z7189 Other specified counseling: Secondary | ICD-10-CM

## 2017-06-22 MED ORDER — VARENICLINE TARTRATE 1 MG PO TABS: 1.0000 mg | ORAL_TABLET | Freq: Two times a day (BID) | ORAL | 6 refills | Status: DC

## 2017-06-22 MED ORDER — CITALOPRAM HYDROBROMIDE 20 MG PO TABS: 20.0000 mg | ORAL_TABLET | Freq: Every day | ORAL | 1 refills | Status: DC

## 2017-06-22 MED ORDER — AMLODIPINE BESYLATE 5 MG PO TABS: 5.0000 mg | ORAL_TABLET | Freq: Every day | ORAL | 1 refills | Status: DC

## 2017-06-22 MED ORDER — BENAZEPRIL-HYDROCHLOROTHIAZIDE 20-25 MG PO TABS: 1.0000 | ORAL_TABLET | Freq: Every day | ORAL | 1 refills | Status: DC

## 2017-06-22 MED ORDER — VARENICLINE TARTRATE 0.5 MG PO TABS: 0.5000 mg | ORAL_TABLET | Freq: Two times a day (BID) | ORAL | 0 refills | Status: DC

## 2017-06-22 MED ORDER — TRAMADOL HCL 50 MG PO TABS: 50.0000 mg | ORAL_TABLET | Freq: Two times a day (BID) | ORAL | 1 refills | Status: DC | PRN

## 2017-06-22 MED ORDER — ATENOLOL 100 MG PO TABS: 100.0000 mg | ORAL_TABLET | Freq: Every day | ORAL | 1 refills | Status: DC

## 2017-06-22 NOTE — Assessment & Plan Note (Signed)
A voluntary discussion about advance care planning including the explanation and discussion of advance directives was extensively discussed  with the patient.  Explanation about the health care proxy and Living will was reviewed and packet with forms with explanation of how to fill them out was given.  During this discussion, the patient has 3 people in mind for health care proxy  Patient was offered a separate [place] visit for further assistance with forms.

## 2017-06-22 NOTE — Progress Notes (Addendum)
BP 126/71   Pulse 65   Temp 98.2 F (36.8 C)   Ht 5' 6.2" (1.681 m)   Wt 207 lb 6.4 oz (94.1 kg)   SpO2 97%   BMI 33.27 kg/m    Subjective:    Patient ID: Sarah [name], female    DOB: 1951/03/05, 66 y.o.   MRN: [id number]  HPI: Sarah [name] is a 66 y.o. female  Chief Complaint  Patient presents with  . Medicare Wellness   Functional Status Survey: Is the patient deaf or have difficulty hearing?: No Does the patient have difficulty seeing, even when wearing glasses/contacts?: No Does the patient have difficulty concentrating, remembering, or making decisions?: Yes (remembering sometimes) Does the patient have difficulty walking or climbing stairs?: No (knee pain sometimes ) Does the patient have difficulty dressing or bathing?: No Does the patient have difficulty doing errands alone such as visiting a doctor's office or shopping?: No Fall Risk  06/22/2017 06/09/2016  Falls in the past year? No No   Depression She is doing will with present medications.   Depression screen [facility] 2/9 06/22/2017 12/15/2016 06/09/2016  Decreased Interest 0 1 0  Down, Depressed, Hopeless 0 1 0  PHQ - 2 Score 0 2 0  Altered sleeping 1 - -  Tired, decreased energy 1 - -  Change in appetite 1 - -  Feeling bad or failure about yourself  0 - -  Trouble concentrating 0 - -  Moving slowly or fidgety/restless 0 - -  Suicidal thoughts 0 - -  PHQ-9 Score 3 - -   Hypertension Using medications without difficulty Average home BPs Not checking   No problems or lightheadedness No chest pain with exertion or shortness of breath No Edema  Knee pain Using [facility] and pads for bilateral knee pain.  She would like something for knee pain as needed.  Currently not taking Tylenol or Advil  Tobacco Would like to quit smoking and currently smokes 1/4 ppd.  She in interested in trying Chantix.    Relevant past medical, surgical, family and social history reviewed and updated as indicated. Interim  medical history since our last visit reviewed. Allergies and medications reviewed and updated.  Review of Systems  Per HPI unless specifically indicated above     Objective:    BP 126/71   Pulse 65   Temp 98.2 F (36.8 C)   Ht 5' 6.2" (1.681 m)   Wt 207 lb 6.4 oz (94.1 kg)   SpO2 97%   BMI 33.27 kg/m   Wt Readings from Last 3 Encounters:  06/22/17 207 lb 6.4 oz (94.1 kg)  04/28/17 208 lb (94.3 kg)  04/19/17 209 lb 8 oz (95 kg)    Physical Exam  Constitutional: She is oriented to person, place, and time. She appears well-developed and well-nourished.  HENT:  Head: Normocephalic and atraumatic.  Eyes: Pupils are equal, round, and reactive to light. Right eye exhibits no discharge. Left eye exhibits no discharge. No scleral icterus.  Neck: Normal range of motion. Neck supple. Carotid bruit is not present. No thyromegaly present.  Cardiovascular: Normal rate, regular rhythm and normal heart sounds.  Exam reveals no gallop and no friction rub.   No murmur heard. Pulmonary/Chest: Effort normal and breath sounds normal. No respiratory distress. She has no wheezes. She has no rales.  Abdominal: Soft. Bowel sounds are normal. There is no tenderness. There is no rebound.  Genitourinary: No breast swelling, tenderness or discharge.  Musculoskeletal:  Normal range of motion.  Lymphadenopathy:    She has no cervical adenopathy.  Neurological: She is alert and oriented to person, place, and time.  Skin: Skin is warm, dry and intact. No rash noted.  Psychiatric: She has a normal mood and affect. Her speech is normal and behavior is normal. Judgment and thought content normal. Cognition and memory are normal.   EKG shows normal sinus rhythm without acute changes     Assessment & Plan:   Problem List Items Addressed This Visit      Unprioritized   Advance care planning    A voluntary discussion about advance care planning including the explanation and discussion of advance directives  was extensively discussed  with the patient.  Explanation about the health care proxy and Living will was reviewed and packet with forms with explanation of how to fill them out was given.  During this discussion, the patient has 3 people in mind for health care proxy  Patient was offered a separate [place] visit for further assistance with forms.         Depression    Stable, continue present medications.        Relevant Medications   citalopram (CELEXA) 20 MG tablet   Essential hypertension, benign    Stable, continue present medications.        Relevant Medications   atenolol (TENORMIN) 100 MG tablet   benazepril-hydrochlorthiazide (LOTENSIN HCT) 20-25 MG tablet   amLODipine (NORVASC) 5 MG tablet   Other Relevant Orders   Comprehensive metabolic panel   Lipid Panel w/o Chol/HDL Ratio   Knee osteoarthritis    Recommended Tylenol plus present topicals.  Rx for 30 Tramadol with a refill to take as needed.        Relevant Medications   traMADol (ULTRAM) 50 MG tablet   Tobacco dependence    Pt ready to quit smoking.  Start Chantix.  Pt ed on use.  Counseling was greater than 3 minutes and included how to take Chantix, side-effects, and strategies to quit        Relevant Medications   varenicline (CHANTIX) 0.5 MG tablet   varenicline (CHANTIX CONTINUING MONTH PAK) 1 MG tablet    Other Visit Diagnoses    Medicare annual wellness visit, initial    -  Primary   Relevant Orders   EKG 12-Lead (Completed)   Need for pneumococcal vaccination       Relevant Orders   Pneumococcal conjugate vaccine 13-valent IM (Completed)   Annual physical exam       Relevant Orders   UA/M w/rflx Culture, Routine   Breast cancer screening       Relevant Orders   MM DIGITAL SCREENING BILATERAL       Follow up plan: Return in about 6 months (around 12/23/2017).

## 2017-06-22 NOTE — Assessment & Plan Note (Signed)
Recommended Tylenol plus present topicals.  Rx for 30 Tramadol with a refill to take as needed.

## 2017-06-22 NOTE — Patient Instructions (Addendum)
Pneumococcal Conjugate Vaccine (PCV13) What You Need to Know 1. Why get vaccinated? Vaccination can protect both children and adults from pneumococcal disease. Pneumococcal disease is caused by bacteria that can spread from person to person through close contact. It can cause ear infections, and it can also lead to more serious infections of the:  Lungs (pneumonia),  Blood (bacteremia), and  Covering of the brain and spinal cord (meningitis).  Pneumococcal pneumonia is most common among adults. Pneumococcal meningitis can cause deafness and brain damage, and it kills about 1 child in 10 who get it. Anyone can get pneumococcal disease, but children under 2 years of age and adults 65 years and older, people with certain medical conditions, and cigarette smokers are at the highest risk. Before there was a vaccine, the [place] saw:  more than 700 cases of meningitis,  about 13,000 blood infections,  about 5 million ear infections, and  about 200 deaths  in children under 5 each year from pneumococcal disease. Since vaccine became available, severe pneumococcal disease in these children has fallen by 88%. About 18,000 older adults die of pneumococcal disease each year in the [place]. Treatment of pneumococcal infections with penicillin and other drugs is not as effective as it used to be, because some strains of the disease have become resistant to these drugs. This makes prevention of the disease, through vaccination, even more important. 2. PCV13 vaccine Pneumococcal conjugate vaccine (called PCV13) protects against 13 types of pneumococcal bacteria. PCV13 is routinely given to children at 2, 4, 6, and 12-15 months of age. It is also recommended for children and adults 2 to 64 years of age with certain health conditions, and for all adults 65 years of age and older. Your doctor can give you details. 3. Some people should not get this vaccine Anyone who has ever had a  life-threatening allergic reaction to a dose of this vaccine, to an earlier pneumococcal vaccine called PCV7, or to any vaccine containing diphtheria toxoid (for example, DTaP), should not get PCV13. Anyone with a severe allergy to any component of PCV13 should not get the vaccine. Tell your doctor if the person being vaccinated has any severe allergies. If the person scheduled for vaccination is not feeling well, your healthcare provider might decide to reschedule the shot on another day. 4. Risks of a vaccine reaction With any medicine, including vaccines, there is a chance of reactions. These are usually mild and go away on their own, but serious reactions are also possible. Problems reported following PCV13 varied by age and dose in the series. The most common problems reported among children were:  About half became drowsy after the shot, had a temporary loss of appetite, or had redness or tenderness where the shot was given.  About 1 out of 3 had swelling where the shot was given.  About 1 out of 3 had a mild fever, and about 1 in 20 had a fever over 102.2F.  Up to about 8 out of 10 became fussy or irritable.  Adults have reported pain, redness, and swelling where the shot was given; also mild fever, fatigue, headache, chills, or muscle pain. Young children who get PCV13 along with inactivated flu vaccine at the same time may be at increased risk for seizures caused by fever. Ask your doctor for more information. Problems that could happen after any vaccine:  People sometimes faint after a medical procedure, including vaccination. Sitting or lying down for about 15 minutes can help prevent   fainting, and injuries caused by a fall. Tell your doctor if you feel dizzy, or have vision changes or ringing in the ears.  Some older children and adults get severe pain in the shoulder and have difficulty moving the arm where a shot was given. This happens very rarely.  Any medication can cause a  severe allergic reaction. Such reactions from a vaccine are very rare, estimated at about 1 in a million doses, and would happen within a few minutes to a few hours after the vaccination. As with any medicine, there is a very small chance of a vaccine causing a serious injury or death. The safety of vaccines is always being monitored. For more information, visit: www.cdc.gov/vaccinesafety/ 5. What if there is a serious reaction? What should I look for? Look for anything that concerns you, such as signs of a severe allergic reaction, very high fever, or unusual behavior. Signs of a severe allergic reaction can include hives, swelling of the face and throat, difficulty breathing, a fast heartbeat, dizziness, and weakness-usually within a few minutes to a few hours after the vaccination. What should I do?  If you think it is a severe allergic reaction or other emergency that can't wait, call 9-1-1 or get the person to the nearest hospital. Otherwise, call your doctor.  Reactions should be reported to the Vaccine Adverse Event Reporting System (VAERS). Your doctor should file this report, or you can do it yourself through the VAERS web site at www.vaers.hhs.gov, or by calling 1-800-822-7967. ? VAERS does not give medical advice. 6. The National Vaccine Injury Compensation Program The National Vaccine Injury Compensation Program (VICP) is a federal program that was created to compensate people who may have been injured by certain vaccines. Persons who believe they may have been injured by a vaccine can learn about the program and about filing a claim by calling 1-800-338-2382 or visiting the VICP website at www.hrsa.gov/vaccinecompensation. There is a time limit to file a claim for compensation. 7. How can I learn more?  Ask your healthcare provider. He or she can give you the vaccine package insert or suggest other sources of information.  Call your local or state health department.  Contact the  Centers for Disease Control and Prevention (CDC): ? Call 1-800-232-4636 (1-800-CDC-INFO) or ? Visit CDC's website at www.cdc.gov/vaccines Vaccine Information Statement, PCV13 Vaccine (10/04/2014) This information is not intended to replace advice given to you by your health care provider. Make sure you discuss any questions you have with your health care provider. Document Released: 09/13/2006 Document Revised: 08/06/2016 Document Reviewed: 08/06/2016 Elsevier Interactive Patient Education  2017 Elsevier Inc.  

## 2017-06-22 NOTE — Assessment & Plan Note (Signed)
Stable, continue present medications.   

## 2017-06-22 NOTE — Assessment & Plan Note (Addendum)
Pt ready to quit smoking.  Start Chantix.  Pt ed on use.  Counseling was greater than 3 minutes and included how to take Chantix, side-effects, and strategies to quit

## 2017-06-23 ENCOUNTER — Telehealth: Payer: Self-pay | Admitting: Unknown Physician Specialty

## 2017-06-23 ENCOUNTER — Encounter: Payer: Self-pay | Admitting: Unknown Physician Specialty

## 2017-06-23 DIAGNOSIS — N186 End stage renal disease: Secondary | ICD-10-CM | POA: Insufficient documentation

## 2017-06-23 DIAGNOSIS — N184 Chronic kidney disease, stage 4 (severe): Secondary | ICD-10-CM | POA: Insufficient documentation

## 2017-06-23 DIAGNOSIS — N183 Chronic kidney disease, stage 3 unspecified: Secondary | ICD-10-CM

## 2017-06-23 LAB — COMPREHENSIVE METABOLIC PANEL
A/G RATIO: 1.4 (ref 1.2–2.2)
ALK PHOS: 95 IU/L (ref 39–117)
ALT: 9 IU/L (ref 0–32)
AST: 14 IU/L (ref 0–40)
Albumin: 4.2 g/dL (ref 3.6–4.8)
BILIRUBIN TOTAL: 0.2 mg/dL (ref 0.0–1.2)
BUN / CREAT RATIO: 20 (ref 12–28)
BUN: 33 mg/dL — ABNORMAL HIGH (ref 8–27)
CO2: 22 mmol/L (ref 20–29)
Calcium: 9.8 mg/dL (ref 8.7–10.3)
Chloride: 102 mmol/L (ref 96–106)
Creatinine, Ser: 1.61 mg/dL — ABNORMAL HIGH (ref 0.57–1.00)
GFR calc Af Amer: 38 mL/min/{1.73_m2} — ABNORMAL LOW (ref >59)
GFR calc non Af Amer: 33 mL/min/{1.73_m2} — ABNORMAL LOW (ref >59)
GLOBULIN, TOTAL: 2.9 g/dL (ref 1.5–4.5)
Glucose: 88 mg/dL (ref 65–99)
Potassium: 4.4 mmol/L (ref 3.5–5.2)
Sodium: 141 mmol/L (ref 134–144)
Total Protein: 7.1 g/dL (ref 6.0–8.5)

## 2017-06-23 LAB — LIPID PANEL W/O CHOL/HDL RATIO
Cholesterol, Total: 190 mg/dL (ref 100–199)
HDL: 41 mg/dL (ref >39)
LDL CALC: 132 mg/dL — AB (ref 0–99)
Triglycerides: 85 mg/dL (ref 0–149)
VLDL CHOLESTEROL CAL: 17 mg/dL (ref 5–40)

## 2017-06-23 MED ORDER — ATORVASTATIN CALCIUM 20 MG PO TABS: 20.0000 mg | ORAL_TABLET | Freq: Every day | ORAL | 1 refills | Status: DC

## 2017-06-23 NOTE — Telephone Encounter (Signed)
Discussed labs.  Refer to Neprology due to low GFR.  Start Atorvastatin.  Recheck in 3 months

## 2017-06-23 NOTE — Telephone Encounter (Signed)
Note for Atorvastatin and nephrology

## 2017-06-24 LAB — UA/M W/RFLX CULTURE, ROUTINE
BILIRUBIN UA: NEGATIVE
Glucose, UA: NEGATIVE
KETONES UA: NEGATIVE
LEUKOCYTES UA: NEGATIVE
Nitrite, UA: NEGATIVE
PROTEIN UA: NEGATIVE
RBC, UA: NEGATIVE
SPEC GRAV UA: 1.01 (ref 1.005–1.030)
Urobilinogen, Ur: 0.2 mg/dL (ref 0.2–1.0)
pH, UA: 5.5 (ref 5.0–7.5)

## 2017-06-24 LAB — MICROSCOPIC EXAMINATION
Bacteria, UA: NONE SEEN
CAST TYPE: NONE SEEN
CASTS: NONE SEEN /LPF
CRYSTAL TYPE: NONE SEEN
Crystals: NONE SEEN
Mucus, UA: NONE SEEN
RBC, UA: NONE SEEN /hpf (ref 0–?)
Renal Epithel, UA: NONE SEEN /hpf
Trichomonas, UA: NONE SEEN
Yeast, UA: NONE SEEN

## 2017-06-29 ENCOUNTER — Telehealth: Payer: Self-pay | Admitting: Unknown Physician Specialty

## 2017-06-29 NOTE — Telephone Encounter (Signed)
Received a call from [facility] requesting notes, labs and reason for ref on this patient be faxed  Fax [phone]  Thank You

## 2017-06-29 NOTE — Telephone Encounter (Signed)
According to referral in chart, it was sent to [place]. Asked Santiago Glad if she had a phone number for [facility] to call and see what is going on with this, not sure if [facility] and [facility] is the same place or what. I called [phone] and spoke to Cameron. She states that the referrals were sent to [facility] in [place] and then sent to her at the [place] office. She stated that they were not part of [place]. Lovey Newcomer states that she needs this patient's OV notes, labs, and why the patient was referred to them. Information printed and faxed to [place].

## 2017-07-06 ENCOUNTER — Other Ambulatory Visit: Payer: Self-pay | Admitting: Unknown Physician Specialty

## 2017-07-14 ENCOUNTER — Ambulatory Visit
Admission: RE | Admit: 2017-07-14 | Discharge: 2017-07-14 | Disposition: A | Discharge status: Home / Self Care | Payer: 59 | Source: Ambulatory Visit | Attending: Unknown Physician Specialty | Admitting: Unknown Physician Specialty

## 2017-07-14 DIAGNOSIS — Z1231 Encounter for screening mammogram for malignant neoplasm of breast: Secondary | ICD-10-CM | POA: Insufficient documentation

## 2017-07-14 DIAGNOSIS — Z1239 Encounter for other screening for malignant neoplasm of breast: Secondary | ICD-10-CM

## 2017-07-19 ENCOUNTER — Other Ambulatory Visit: Payer: Self-pay | Admitting: *Deleted

## 2017-07-19 ENCOUNTER — Inpatient Hospital Stay
Admission: RE | Admit: 2017-07-19 | Discharge: 2017-07-19 | Disposition: A | Discharge status: Home / Self Care | Payer: Self-pay | Source: Ambulatory Visit | Attending: *Deleted | Admitting: *Deleted

## 2017-07-19 DIAGNOSIS — Z9289 Personal history of other medical treatment: Secondary | ICD-10-CM

## 2017-08-30 DIAGNOSIS — Z23 Encounter for immunization: Secondary | ICD-10-CM | POA: Diagnosis not present

## 2017-09-14 DIAGNOSIS — N281 Cyst of kidney, acquired: Secondary | ICD-10-CM | POA: Diagnosis not present

## 2017-09-14 DIAGNOSIS — R600 Localized edema: Secondary | ICD-10-CM | POA: Diagnosis not present

## 2017-09-14 DIAGNOSIS — N183 Chronic kidney disease, stage 3 (moderate): Secondary | ICD-10-CM | POA: Diagnosis not present

## 2017-09-14 DIAGNOSIS — I10 Essential (primary) hypertension: Secondary | ICD-10-CM | POA: Diagnosis not present

## 2017-09-14 LAB — BASIC METABOLIC PANEL
Creatinine: 1.6 — AB (ref <=1.1)
Glucose: 87
Potassium: 4.2 (ref 3.4–5.3)

## 2017-09-15 ENCOUNTER — Other Ambulatory Visit: Payer: Self-pay

## 2017-09-23 ENCOUNTER — Encounter: Payer: Self-pay | Admitting: Nephrology

## 2017-10-20 ENCOUNTER — Other Ambulatory Visit: Payer: Medicare Other

## 2017-10-20 DIAGNOSIS — C642 Malignant neoplasm of left kidney, except renal pelvis: Secondary | ICD-10-CM

## 2017-10-21 LAB — BASIC METABOLIC PANEL
BUN/Creatinine Ratio: 22 (ref 12–28)
BUN: 32 mg/dL — ABNORMAL HIGH (ref 8–27)
CALCIUM: 9.3 mg/dL (ref 8.7–10.3)
CO2: 22 mmol/L (ref 20–29)
Chloride: 108 mmol/L — ABNORMAL HIGH (ref 96–106)
Creatinine, Ser: 1.44 mg/dL — ABNORMAL HIGH (ref 0.57–1.00)
GFR, EST AFRICAN AMERICAN: 44 mL/min/{1.73_m2} — AB (ref >59)
GFR, EST NON AFRICAN AMERICAN: 38 mL/min/{1.73_m2} — AB (ref >59)
Glucose: 90 mg/dL (ref 65–99)
Potassium: 4.2 mmol/L (ref 3.5–5.2)
SODIUM: 146 mmol/L — AB (ref 134–144)

## 2017-10-22 ENCOUNTER — Ambulatory Visit
Admission: RE | Admit: 2017-10-22 | Discharge: 2017-10-22 | Disposition: A | Discharge status: Home / Self Care | Payer: 59 | Source: Ambulatory Visit | Attending: Oncology | Admitting: Oncology

## 2017-10-22 ENCOUNTER — Other Ambulatory Visit: Payer: Self-pay

## 2017-10-22 DIAGNOSIS — C642 Malignant neoplasm of left kidney, except renal pelvis: Secondary | ICD-10-CM

## 2017-10-24 NOTE — Progress Notes (Signed)
[place]  Telephone:([phone]) [phone] Fax:([phone]) [phone]  ID: Sarah [name] OB: 13-Sep-1951  MR#: [id number]  DDU#:[id number]  Patient Care Team: Kathrine Haddock, NP as PCP - General (Nurse Practitioner) Nickie Retort, MD as Consulting Physician (Urology) Lloyd Huger, MD as Consulting Physician (Oncology)  CHIEF COMPLAINT: Stage III renal cell carcinoma, left.  INTERVAL HISTORY: Patient returns to clinic today for routine 97-month evaluation and discussion of her imaging results. She continues to feel well and is asymptomatic. She has no neurologic complaints. She denies any recent fevers or illnesses. She has a good appetite and denies weight loss. She has no chest pain or shortness of breath.  She has no abdominal pain.  She denies any nausea, vomiting, constipation, or diarrhea. She has no urinary complaints. Patient offers no specific complaints today.  REVIEW OF SYSTEMS:   Review of Systems  Constitutional: Negative.  Negative for fever, malaise/fatigue and weight loss.  Respiratory: Negative.  Negative for cough and shortness of breath.   Cardiovascular: Negative.  Negative for chest pain and leg swelling.  Gastrointestinal: Negative.  Negative for abdominal pain.  Genitourinary: Negative.  Negative for flank pain and hematuria.  Musculoskeletal: Negative.   Skin: Negative.  Negative for rash.  Neurological: Negative.  Negative for weakness.  Psychiatric/Behavioral: Negative.  The patient is not nervous/anxious.     As per HPI. Otherwise, a complete review of systems is negative.  PAST MEDICAL HISTORY: Past Medical History:  Diagnosis Date  . Abnormal mammogram, unspecified   . Aneurysm ([place]) 2004   cerebral x  4, [facility]  . Anxiety   . Arthritis   . Benign neoplasm of breast 2012  . Breast complaint 2010  . Chronic kidney disease    Renal Cell Cancer  . Hypertension   . Obesity, unspecified   . Personal history of tobacco use,  presenting hazards to health   . Renal cancer, left ([place]) 11/2016   Renal Cell with Left nephrectomy.  Marland Kitchen Special screening for malignant neoplasms, colon     PAST SURGICAL HISTORY: Past Surgical History:  Procedure Laterality Date  . ABDOMINAL HYSTERECTOMY  1993  . BREAST BIOPSY Right 2012   stereo bx showing a 52mm radial scar in an estimated 8 volume of tissue  . BREAST BIOPSY Right Apr 09, 2005   Fibrocystic changes, ductal adenosis, microcalcifications, focal stromal/epithelial proliferation, possible fibroadenoma type proliferation  . CEREBRAL ANEURYSM REPAIR  2004  . COLONOSCOPY  2008   Dr. Nicolasa Ducking  . EYE SURGERY    . FOOT SURGERY Right 2003  . LAPAROSCOPIC NEPHRECTOMY, HAND ASSISTED Left 01/01/2017   Procedure: HAND ASSISTED LAPAROSCOPIC NEPHRECTOMY;  Surgeon: Nickie Retort, MD;  Location: [facility] ORS;  Service: Urology;  Laterality: Left;  . TONSILLECTOMY  1997    FAMILY HISTORY: Family History  Problem Relation Age of Onset  . Cancer Cousin        maternal first cousin with breast cancer  . Cancer Other        colon and ovarian cancers, no relationship listed  . Cancer Father        prostate  . Hypertension Father   . Hypertension Sister   . Hypertension Brother   . Hypertension Paternal Grandmother   . Heart disease Paternal Grandmother   . Hypertension Paternal Grandfather   . Heart disease Paternal Grandfather     ADVANCED DIRECTIVES (Y/N):  N  HEALTH MAINTENANCE: Social History   Tobacco Use  . Smoking status: Current  Every Day Smoker    Packs/day: 0.25    Years: 40.00    Pack years: 10.00    Types: Cigarettes  . Smokeless tobacco: Never Used  Substance Use Topics  . Alcohol use: Yes    Comment: pt states she is not currently having any alcohol  . Drug use: No     Colonoscopy:  PAP:  Bone density:  Lipid panel:  Allergies  Allergen Reactions  . Codeine Itching    Current Outpatient Medications  Medication Sig Dispense Refill  .  acetaminophen (TYLENOL) 325 MG tablet Take 650 mg by mouth every 6 (six) hours as needed for mild pain, moderate pain or headache.     Marland Kitchen amLODipine (NORVASC) 5 MG tablet Take 1 tablet (5 mg total) by mouth daily. 90 tablet 1  . atenolol (TENORMIN) 100 MG tablet Take 1 tablet (100 mg total) by mouth daily. 90 tablet 1  . atorvastatin (LIPITOR) 20 MG tablet Take 1 tablet (20 mg total) by mouth daily. 90 tablet 1  . benazepril-hydrochlorthiazide (LOTENSIN HCT) 20-25 MG tablet Take 1 tablet by mouth daily. 90 tablet 1  . benazepril-hydrochlorthiazide (LOTENSIN HCT) 20-25 MG tablet TAKE 1 TABLET BY MOUTH DAILY. 90 tablet 0  . Cholecalciferol (VITAMIN D-3 PO) Take 600 Units by mouth daily.    . citalopram (CELEXA) 20 MG tablet Take 1 tablet (20 mg total) by mouth at bedtime. 90 tablet 1  . Multiple Vitamin (MULTIVITAMIN) tablet Take 1 tablet by mouth daily.    . traMADol (ULTRAM) 50 MG tablet Take 1 tablet (50 mg total) by mouth every 12 (twelve) hours as needed. 30 tablet 1   No current facility-administered medications for this visit.     OBJECTIVE: Vitals:   10/25/17 1404  BP: (!) 145/79  Pulse: 61  Resp: 20  Temp: (!) 96.7 F (35.9 C)     Body mass index is 34.24 kg/m.    ECOG FS:0 - Asymptomatic  General: Well-developed, well-nourished, no acute distress. Eyes: Pink conjunctiva, anicteric sclera. Lungs: Clear to auscultation bilaterally. Heart: Regular rate and rhythm. No rubs, murmurs, or gallops. Abdomen: Soft, nontender, nondistended. No organomegaly noted, normoactive bowel sounds. Musculoskeletal: No edema, cyanosis, or clubbing. Neuro: Alert, answering all questions appropriately. Cranial nerves grossly intact. Skin: No rashes or petechiae noted. Psych: Normal affect.   LAB RESULTS:  Lab Results  Component Value Date   NA 136 10/25/2017   K 3.8 10/25/2017   CL 102 10/25/2017   CO2 24 10/25/2017   GLUCOSE 93 10/25/2017   BUN 25 (H) 10/25/2017   CREATININE 1.50 (H)  10/25/2017   CALCIUM 9.6 10/25/2017   PROT 7.7 10/25/2017   ALBUMIN 3.8 10/25/2017   AST 17 10/25/2017   ALT 11 (L) 10/25/2017   ALKPHOS 79 10/25/2017   BILITOT 0.5 10/25/2017   GFRNONAA 35 (L) 10/25/2017   GFRAA 41 (L) 10/25/2017    Lab Results  Component Value Date   WBC 7.4 10/25/2017   NEUTROABS 3.6 10/25/2017   HGB 11.3 (L) 10/25/2017   HCT 33.3 (L) 10/25/2017   MCV 88.9 10/25/2017   PLT 299 10/25/2017     STUDIES: Ct Abdomen Pelvis W Contrast  Result Date: 10/25/2017 CLINICAL DATA:  Followup left renal cell carcinoma. Status post left nephrectomy. EXAM: CT ABDOMEN AND PELVIS WITH CONTRAST TECHNIQUE: Multidetector CT imaging of the abdomen and pelvis was performed using the standard protocol following bolus administration of intravenous contrast. CONTRAST:  51mL ISOVUE-300 IOPAMIDOL (ISOVUE-300) INJECTION 61% COMPARISON:  04/16/2017  FINDINGS: Lower Chest: No acute findings. Hepatobiliary: No hepatic masses identified. 2 cm cholesterol gallstone again seen, without evidence of cholecystitis or biliary ductal dilatation. Pancreas:  No mass or inflammatory changes. Spleen: Within normal limits in size and appearance. Adrenals/Urinary Tract: Stable postop changes from previous left nephrectomy. Normal left adrenal gland. Stable homogeneous low-attenuation right adrenal mass, consistent with benign adenoma. Stable simple and hemorrhagic right renal cysts. No evidence of hydronephrosis. Unremarkable unopacified urinary bladder. Stomach/Bowel: No evidence of obstruction, inflammatory process or abnormal fluid collections. Diverticulosis involving the descending and sigmoid colon, without evidence of diverticulitis. Vascular/Lymphatic: No pathologically enlarged lymph nodes. No abdominal aortic aneurysm. Aortic atherosclerosis. Reproductive: Prior hysterectomy noted. Adnexal regions are unremarkable in appearance. Mild pelvic floor laxity noted. Other:  None. Musculoskeletal:  No suspicious  bone lesions identified. IMPRESSION: No evidence of recurrent or metastatic carcinoma. Stable right renal cystic lesions. Recommend continued attention on follow-up CT. Stable benign right adrenal adenoma. Colonic diverticulosis, without radiographic evidence of diverticulitis. Cholelithiasis.  No radiographic evidence of cholecystitis. Electronically Signed   By: Earle Gell M.D.   On: 10/25/2017 13:07    ASSESSMENT: Stage III renal cell carcinoma, left.  PLAN:  1. Stage III renal cell carcinoma, left:  CT scan results from October 25, 2017 reviewed independently and reported as above with no evidence of recurrent or metastatic disease. According NCCN guidelines, patient will require abdominal/pelvic CT scan approximately every 6 months for 3 years after her nephrectomy which occurred on January 01, 2017. She then will require annual imaging for a total of 5 years. Return to clinic in 6 months with repeat CT scan and further evaluation. 2. Anemia: Stable, monitor.  3. Chronic renal insufficiency: Patient's creatinine appears to be approximately her baseline.  Monitor closely now that patient is status post nephrectomy.  Approximately 20 minutes spent in discussion of which greater than 50% was consultation.  Patient expressed understanding and was in agreement with this plan. She also understands that She can call clinic at any time with any questions, concerns, or complaints.   Cancer Staging Renal cancer, left [facility]) Staging form: Kidney, AJCC 8th Edition - Clinical stage from 01/26/2017: Stage III (cT3a, cNX, cM0) - Signed by Lloyd Huger, MD on 01/26/2017   Lloyd Huger, MD   10/28/2017 8:43 AM

## 2017-10-25 ENCOUNTER — Other Ambulatory Visit: Payer: Self-pay

## 2017-10-25 ENCOUNTER — Encounter: Payer: Self-pay | Admitting: Oncology

## 2017-10-25 ENCOUNTER — Ambulatory Visit
Admission: RE | Admit: 2017-10-25 | Discharge: 2017-10-25 | Disposition: A | Discharge status: Home / Self Care | Payer: Commercial Managed Care - HMO | Source: Ambulatory Visit | Attending: Oncology | Admitting: Oncology

## 2017-10-25 ENCOUNTER — Inpatient Hospital Stay: Payer: 59 | Attending: Oncology | Admitting: Oncology

## 2017-10-25 ENCOUNTER — Inpatient Hospital Stay: Payer: 59

## 2017-10-25 VITALS — BP 145/79 | HR 61 | Temp 96.7°F | Resp 20 | Wt 213.4 lb

## 2017-10-25 DIAGNOSIS — E669 Obesity, unspecified: Secondary | ICD-10-CM

## 2017-10-25 DIAGNOSIS — C642 Malignant neoplasm of left kidney, except renal pelvis: Secondary | ICD-10-CM

## 2017-10-25 DIAGNOSIS — Z8041 Family history of malignant neoplasm of ovary: Secondary | ICD-10-CM | POA: Diagnosis not present

## 2017-10-25 DIAGNOSIS — K8020 Calculus of gallbladder without cholecystitis without obstruction: Secondary | ICD-10-CM

## 2017-10-25 DIAGNOSIS — F17210 Nicotine dependence, cigarettes, uncomplicated: Secondary | ICD-10-CM

## 2017-10-25 DIAGNOSIS — Z8673 Personal history of transient ischemic attack (TIA), and cerebral infarction without residual deficits: Secondary | ICD-10-CM | POA: Diagnosis not present

## 2017-10-25 DIAGNOSIS — Z800 Family history of malignant neoplasm of digestive organs: Secondary | ICD-10-CM

## 2017-10-25 DIAGNOSIS — D649 Anemia, unspecified: Secondary | ICD-10-CM | POA: Diagnosis not present

## 2017-10-25 DIAGNOSIS — I700 Atherosclerosis of aorta: Secondary | ICD-10-CM | POA: Diagnosis not present

## 2017-10-25 DIAGNOSIS — M129 Arthropathy, unspecified: Secondary | ICD-10-CM | POA: Diagnosis not present

## 2017-10-25 DIAGNOSIS — E279 Disorder of adrenal gland, unspecified: Secondary | ICD-10-CM | POA: Diagnosis not present

## 2017-10-25 DIAGNOSIS — N189 Chronic kidney disease, unspecified: Secondary | ICD-10-CM | POA: Diagnosis not present

## 2017-10-25 DIAGNOSIS — I129 Hypertensive chronic kidney disease with stage 1 through stage 4 chronic kidney disease, or unspecified chronic kidney disease: Secondary | ICD-10-CM

## 2017-10-25 DIAGNOSIS — Z803 Family history of malignant neoplasm of breast: Secondary | ICD-10-CM

## 2017-10-25 DIAGNOSIS — K5730 Diverticulosis of large intestine without perforation or abscess without bleeding: Secondary | ICD-10-CM | POA: Diagnosis not present

## 2017-10-25 DIAGNOSIS — Z79899 Other long term (current) drug therapy: Secondary | ICD-10-CM

## 2017-10-25 DIAGNOSIS — Z8042 Family history of malignant neoplasm of prostate: Secondary | ICD-10-CM

## 2017-10-25 DIAGNOSIS — F419 Anxiety disorder, unspecified: Secondary | ICD-10-CM | POA: Diagnosis not present

## 2017-10-25 DIAGNOSIS — D3501 Benign neoplasm of right adrenal gland: Secondary | ICD-10-CM | POA: Diagnosis not present

## 2017-10-25 LAB — CBC WITH DIFFERENTIAL/PLATELET
BASOS ABS: 0.1 10*3/uL (ref 0–0.1)
Basophils Relative: 1 %
EOS PCT: 2 %
Eosinophils Absolute: 0.1 10*3/uL (ref 0–0.7)
HCT: 33.3 % — ABNORMAL LOW (ref 35.0–47.0)
Hemoglobin: 11.3 g/dL — ABNORMAL LOW (ref 12.0–16.0)
LYMPHS ABS: 2.9 10*3/uL (ref 1.0–3.6)
Lymphocytes Relative: 40 %
MCH: 30.2 pg (ref 26.0–34.0)
MCHC: 33.9 g/dL (ref 32.0–36.0)
MCV: 88.9 fL (ref 80.0–100.0)
Monocytes Absolute: 0.6 10*3/uL (ref 0.2–0.9)
Monocytes Relative: 9 %
Neutro Abs: 3.6 10*3/uL (ref 1.4–6.5)
Neutrophils Relative %: 48 %
PLATELETS: 299 10*3/uL (ref 150–440)
RBC: 3.75 MIL/uL — AB (ref 3.80–5.20)
RDW: 14.2 % (ref 11.5–14.5)
WBC: 7.4 10*3/uL (ref 3.6–11.0)

## 2017-10-25 LAB — COMPREHENSIVE METABOLIC PANEL
ALT: 11 U/L — AB (ref 14–54)
AST: 17 U/L (ref 15–41)
Albumin: 3.8 g/dL (ref 3.5–5.0)
Alkaline Phosphatase: 79 U/L (ref 38–126)
Anion gap: 10 (ref 5–15)
BUN: 25 mg/dL — AB (ref 6–20)
CO2: 24 mmol/L (ref 22–32)
CREATININE: 1.5 mg/dL — AB (ref 0.44–1.00)
Calcium: 9.6 mg/dL (ref 8.9–10.3)
Chloride: 102 mmol/L (ref 101–111)
GFR calc Af Amer: 41 mL/min — ABNORMAL LOW (ref >60)
GFR calc non Af Amer: 35 mL/min — ABNORMAL LOW (ref >60)
Glucose, Bld: 93 mg/dL (ref 65–99)
Potassium: 3.8 mmol/L (ref 3.5–5.1)
Sodium: 136 mmol/L (ref 135–145)
TOTAL PROTEIN: 7.7 g/dL (ref 6.5–8.1)
Total Bilirubin: 0.5 mg/dL (ref 0.3–1.2)

## 2017-10-25 MED ORDER — IOPAMIDOL (ISOVUE-300) INJECTION 61%
75.0000 mL | Freq: Once | INTRAVENOUS | Status: AC | PRN
  Administered 2017-10-25: 75 mL via INTRAVENOUS

## 2017-10-25 NOTE — Progress Notes (Signed)
Patient here today for CT results. 

## 2017-10-28 ENCOUNTER — Ambulatory Visit (INDEPENDENT_AMBULATORY_CARE_PROVIDER_SITE_OTHER): Payer: 59 | Admitting: Urology

## 2017-10-28 ENCOUNTER — Encounter: Payer: Self-pay | Admitting: Urology

## 2017-10-28 VITALS — BP 130/78 | HR 78 | Ht 66.0 in | Wt 213.0 lb

## 2017-10-28 DIAGNOSIS — C642 Malignant neoplasm of left kidney, except renal pelvis: Secondary | ICD-10-CM

## 2017-10-28 NOTE — Progress Notes (Signed)
10/28/2017 11:25 AM   Sarah [name] August 08, 1951 [id number]  Referring provider: Kathrine Haddock, NP [address], [place] [address]  Chief Complaint  Patient presents with  . Follow-up    HPI: The patient is a 66 year old female presents today for follow-up after undergoing a left radical laparoscopic hand-assisted nephrectomy on 01/01/2017. She is doing well status post surgery. She denies any new pain or bones. Her repeat imaging of her CT chest abdomen and pelvis were unremarkable in November 2018.  Her right Bosniak 24F cyst is stable in size.  Cr stable at 1.44 with GFR stable at 44.  Pathology: Multifocal clear cell renal cell carcinoma. 3 distinct tumors were seen. 2 were cystic and the third was solid. There was tumor extension into the renal sidedness. Margins were negative. Lymphovascular invasion was not identified. Histologic grade 2. Greatest dimension was 2.1 cm. Stage pT3a.  PMH: Past Medical History:  Diagnosis Date  . Abnormal mammogram, unspecified   . Aneurysm ([place]) 2004   cerebral x  4, [facility]  . Anxiety   . Arthritis   . Benign neoplasm of breast 2012  . Breast complaint 2010  . Chronic kidney disease    Renal Cell Cancer  . Hypertension   . Obesity, unspecified   . Personal history of tobacco use, presenting hazards to health   . Renal cancer, left ([place]) 11/2016   Renal Cell with Left nephrectomy.  [facility] Special screening for malignant neoplasms, colon     Surgical History: Past Surgical History:  Procedure Laterality Date  . ABDOMINAL HYSTERECTOMY  1993  . BREAST BIOPSY Right 2012   stereo bx showing a 38mm radial scar in an estimated 8 volume of tissue  . BREAST BIOPSY Right Apr 09, 2005   Fibrocystic changes, ductal adenosis, microcalcifications, focal stromal/epithelial proliferation, possible fibroadenoma type proliferation  . CEREBRAL ANEURYSM REPAIR  2004  . COLONOSCOPY  2008   Dr. Nicolasa Ducking  . EYE SURGERY    . FOOT SURGERY Right 2003   . LAPAROSCOPIC NEPHRECTOMY, HAND ASSISTED Left 01/01/2017   Procedure: HAND ASSISTED LAPAROSCOPIC NEPHRECTOMY;  Surgeon: Nickie Retort, MD;  Location: [facility] ORS;  Service: Urology;  Laterality: Left;  . TONSILLECTOMY  1997    Home Medications:  Allergies as of 10/28/2017      Reactions   Codeine Itching      Medication List        Accurate as of 10/28/17 11:25 AM. Always use your most recent med list.          acetaminophen 325 MG tablet Commonly known as:  TYLENOL Take 650 mg by mouth every 6 (six) hours as needed for mild pain, moderate pain or headache.   amLODipine 5 MG tablet Commonly known as:  NORVASC Take 1 tablet (5 mg total) by mouth daily.   atenolol 100 MG tablet Commonly known as:  TENORMIN Take 1 tablet (100 mg total) by mouth daily.   atorvastatin 20 MG tablet Commonly known as:  LIPITOR Take 1 tablet (20 mg total) by mouth daily.   benazepril-hydrochlorthiazide 20-25 MG tablet Commonly known as:  LOTENSIN HCT Take 1 tablet by mouth daily.   benazepril-hydrochlorthiazide 20-25 MG tablet Commonly known as:  LOTENSIN HCT TAKE 1 TABLET BY MOUTH DAILY.   citalopram 20 MG tablet Commonly known as:  CELEXA Take 1 tablet (20 mg total) by mouth at bedtime.   multivitamin tablet Take 1 tablet by mouth daily.   traMADol 50 MG tablet Commonly known  as:  ULTRAM Take 1 tablet (50 mg total) by mouth every 12 (twelve) hours as needed.   VITAMIN D-3 PO Take 600 Units by mouth daily.       Allergies:  Allergies  Allergen Reactions  . Codeine Itching    Family History: Family History  Problem Relation Age of Onset  . Cancer Cousin        maternal first cousin with breast cancer  . Cancer Other        colon and ovarian cancers, no relationship listed  . Cancer Father        prostate  . Hypertension Father   . Hypertension Sister   . Hypertension Brother   . Hypertension Paternal Grandmother   . Heart disease Paternal Grandmother   .  Hypertension Paternal Grandfather   . Heart disease Paternal Grandfather     Social History:  reports that she has been smoking cigarettes.  She has a 10.00 pack-year smoking history. she has never used smokeless tobacco. She reports that she drinks alcohol. She reports that she does not use drugs.  ROS: UROLOGY Frequent Urination?: No Hard to postpone urination?: No Burning/pain with urination?: No Get up at night to urinate?: No Leakage of urine?: No Urine stream starts and stops?: No Trouble starting stream?: No Do you have to strain to urinate?: No Blood in urine?: No Urinary tract infection?: No Sexually transmitted disease?: No Injury to kidneys or bladder?: No Painful intercourse?: No Weak stream?: No Currently pregnant?: No Vaginal bleeding?: No Last menstrual period?: n  Gastrointestinal Nausea?: No Vomiting?: No Indigestion/heartburn?: No Diarrhea?: No Constipation?: No  Constitutional Fever: No Night sweats?: No Weight loss?: No Fatigue?: No  Skin Skin rash/lesions?: No Itching?: No  Eyes Blurred vision?: No Double vision?: No  Ears/Nose/Throat Sore throat?: No Sinus problems?: No  Hematologic/Lymphatic Swollen glands?: No Easy bruising?: No  Cardiovascular Leg swelling?: No Chest pain?: No  Respiratory Cough?: No Shortness of breath?: No  Endocrine Excessive thirst?: No  Musculoskeletal Back pain?: No Joint pain?: No  Neurological Headaches?: No Dizziness?: No  Psychologic Depression?: No Anxiety?: No  Physical Exam: BP 130/78   Pulse 78   Ht 5\' 6"  (1.676 m)   Wt 213 lb (96.6 kg)   BMI 34.38 kg/m   Constitutional:  Alert and oriented, No acute distress. HEENT:  AT, moist mucus membranes.  Trachea midline, no masses. Cardiovascular: No clubbing, cyanosis, or edema. Respiratory: Normal respiratory effort, no increased work of breathing. GI: Abdomen is soft, nontender, nondistended, no abdominal masses GU: No CVA  tenderness.  Skin: No rashes, bruises or suspicious lesions. Lymph: No cervical or inguinal adenopathy. Neurologic: Grossly intact, no focal deficits, moving all 4 extremities. Psychiatric: Normal mood and affect.  Laboratory Data: Lab Results  Component Value Date   WBC 7.4 10/25/2017   HGB 11.3 (L) 10/25/2017   HCT 33.3 (L) 10/25/2017   MCV 88.9 10/25/2017   PLT 299 10/25/2017    Lab Results  Component Value Date   CREATININE 1.50 (H) 10/25/2017    No results found for: PSA  No results found for: TESTOSTERONE  No results found for: HGBA1C  Urinalysis    Component Value Date/Time   APPEARANCEUR Clear 06/22/2017 0000   GLUCOSEU Negative 06/22/2017 0000   BILIRUBINUR Negative 06/22/2017 0000   PROTEINUR Negative 06/22/2017 0000   NITRITE Negative 06/22/2017 0000   LEUKOCYTESUR Negative 06/22/2017 0000    Pertinent Imaging: CT reviewed above.  Assessment & Plan:    1. pT3a  multifocal clear cell RCC of left kidney -no evidence of disease -will need CT abdomen/pelvis and CT chest or CXR every 6 months for three years then annually after that per American Urological Association guidelines.  -Will order chest xray now as she has not had this recently.  This is essential for her surveillance as this is where he would expect metastasis to arise. -follow up in 6 months with CT abd/pelvis with and without contrast and CXR prior -will repeat BMP prior to next visit  2. Bosniak 41F right renal cyst -will continue to follow on above imaging.  All CTs of her abdomen and pelvis should be with and without contrast to appropriately evaluate this Bosniak 41F right renal cyst.  Return in about 6 months (around 04/27/2018) for CXR now. CT, CXR, BMP prior to follow up.  Nickie Retort, MD  [facility] [address], [place], [place] [address] [phone]

## 2017-12-14 ENCOUNTER — Ambulatory Visit: Payer: Medicare Other | Admitting: Unknown Physician Specialty

## 2017-12-23 ENCOUNTER — Other Ambulatory Visit: Payer: Self-pay | Admitting: Unknown Physician Specialty

## 2017-12-27 ENCOUNTER — Ambulatory Visit: Payer: Medicare Other | Admitting: Unknown Physician Specialty

## 2018-01-07 ENCOUNTER — Encounter: Payer: Self-pay | Admitting: Unknown Physician Specialty

## 2018-01-07 ENCOUNTER — Ambulatory Visit (INDEPENDENT_AMBULATORY_CARE_PROVIDER_SITE_OTHER): Payer: 59 | Admitting: Unknown Physician Specialty

## 2018-01-07 VITALS — BP 140/73 | HR 70 | Temp 98.5°F | Wt 222.2 lb

## 2018-01-07 DIAGNOSIS — I129 Hypertensive chronic kidney disease with stage 1 through stage 4 chronic kidney disease, or unspecified chronic kidney disease: Secondary | ICD-10-CM

## 2018-01-07 DIAGNOSIS — E7800 Pure hypercholesterolemia, unspecified: Secondary | ICD-10-CM | POA: Diagnosis not present

## 2018-01-07 LAB — LIPID PANEL PICCOLO, WAIVED
CHOLESTEROL PICCOLO, WAIVED: 123 mg/dL (ref <200)
Chol/HDL Ratio Piccolo,Waive: 3.1 mg/dL
HDL CHOL PICCOLO, WAIVED: 40 mg/dL — AB (ref >59)
LDL CHOL CALC PICCOLO WAIVED: 60 mg/dL (ref <100)
Triglycerides Piccolo,Waived: 118 mg/dL (ref <150)
VLDL Chol Calc Piccolo,Waive: 24 mg/dL (ref <30)

## 2018-01-07 MED ORDER — ATORVASTATIN CALCIUM 40 MG PO TABS: 40.0000 mg | ORAL_TABLET | Freq: Every day | ORAL | 3 refills | Status: DC

## 2018-01-07 NOTE — Assessment & Plan Note (Signed)
LDL is 80.  Not to a goal of 70.  Increase Atorvastatin to 40 mg

## 2018-01-07 NOTE — Progress Notes (Signed)
BP 140/73   Pulse 70   Temp 98.5 F (36.9 C) (Oral)   Wt 222 lb 3.2 oz (100.8 kg)   SpO2 98%   BMI 35.86 kg/m    Subjective:    Patient ID: Sarah [name], female    DOB: [birth year], 66 y.o.   MRN: [id number]  HPI: Sarah [name] is a 66 y.o. female  Chief Complaint  Patient presents with  . Depression  . Hyperlipidemia  . Hypertension   Hypertension Using medications without difficulty Average home BPs Not checking   No problems or lightheadedness No chest pain with exertion or shortness of breath No Edema  CKD:  Through [facility]  Hypercholesterol Started statin last visit The 10-year ASCVD risk score Mikey Bussing DC Jr., et al., 2013) is: 21.6%   Values used to calculate the score:     Age: 52 years     Sex: Female     Is Non-Hispanic African American: Yes     Diabetic: No     Tobacco smoker: Yes     Systolic Blood Pressure: 956 mmHg     Is BP treated: Yes     HDL Cholesterol: 41 mg/dL     Total Cholesterol: 190 mg/dL  Knee OA Comes and goes.  Has to take time going up stairs.  Takes a Tramadol every 2-3 weeks.    Depression Doing well with Citalopram Depression screen [facility] 2/9 01/07/2018 06/22/2017 12/15/2016 06/09/2016  Decreased Interest 0 0 1 0  Down, Depressed, Hopeless 0 0 1 0  PHQ - 2 Score 0 0 2 0  Altered sleeping 0 1 - -  Tired, decreased energy 0 1 - -  Change in appetite 0 1 - -  Feeling bad or failure about yourself  0 0 - -  Trouble concentrating 0 0 - -  Moving slowly or fidgety/restless 0 0 - -  Suicidal thoughts 0 0 - -  PHQ-9 Score 0 3 - -    Relevant past medical, surgical, family and social history reviewed and updated as indicated. Interim medical history since our last visit reviewed. Allergies and medications reviewed and updated.  Review of Systems  Constitutional: Negative.   Respiratory: Negative.   Cardiovascular: Negative.   Gastrointestinal: Negative.   Musculoskeletal: Negative.     Per HPI unless  specifically indicated above     Objective:    BP 140/73   Pulse 70   Temp 98.5 F (36.9 C) (Oral)   Wt 222 lb 3.2 oz (100.8 kg)   SpO2 98%   BMI 35.86 kg/m   Wt Readings from Last 3 Encounters:  01/07/18 222 lb 3.2 oz (100.8 kg)  10/28/17 213 lb (96.6 kg)  10/25/17 213 lb 6.4 oz (96.8 kg)    Physical Exam  Constitutional: She is oriented to person, place, and time. She appears well-developed and well-nourished. No distress.  HENT:  Head: Normocephalic and atraumatic.  Eyes: Conjunctivae and lids are normal. Right eye exhibits no discharge. Left eye exhibits no discharge. No scleral icterus.  Neck: Normal range of motion. Neck supple. No JVD present. Carotid bruit is not present.  Cardiovascular: Normal rate, regular rhythm and normal heart sounds.  Pulmonary/Chest: Effort normal and breath sounds normal.  Abdominal: Normal appearance. There is no splenomegaly or hepatomegaly.  Musculoskeletal: Normal range of motion.  Neurological: She is alert and oriented to person, place, and time.  Skin: Skin is warm, dry and intact. No rash noted. No pallor.  Psychiatric: She has a normal mood and affect. Her behavior is normal. Judgment and thought content normal.      Assessment & Plan:   Problem List Items Addressed This Visit      Unprioritized   Benign hypertension with chronic kidney disease    A litle high today. Pt reports improved BP at other offices.  Will see Nephrology soon      Relevant Medications   atorvastatin (LIPITOR) 40 MG tablet   Hyperlipidemia - Primary    LDL is 80.  Not to a goal of 70.  Increase Atorvastatin to 40 mg      Relevant Medications   atorvastatin (LIPITOR) 40 MG tablet   Other Relevant Orders   Lipid Panel Piccolo, Waived      CMP, CBC, BMP pending through other providers  Follow up plan: Return in about 6 months (around 07/07/2018) for physical.

## 2018-01-07 NOTE — Assessment & Plan Note (Signed)
A litle high today. Pt reports improved BP at other offices.  Will see Nephrology soon

## 2018-01-21 ENCOUNTER — Other Ambulatory Visit: Payer: Self-pay | Admitting: Unknown Physician Specialty

## 2018-02-28 ENCOUNTER — Other Ambulatory Visit: Payer: Self-pay | Admitting: Unknown Physician Specialty

## 2018-03-13 ENCOUNTER — Other Ambulatory Visit: Payer: Self-pay | Admitting: Unknown Physician Specialty

## 2018-03-27 IMAGING — MG MM DIGITAL SCREENING BILAT W/ CAD
5 series · 5 of 5 positions shown · non-contrast
Comparison: Previous exam(s).

CLINICAL DATA: Screening.

EXAM:
DIGITAL SCREENING BILATERAL MAMMOGRAM WITH CAD

[L MLO (1 of 2)]
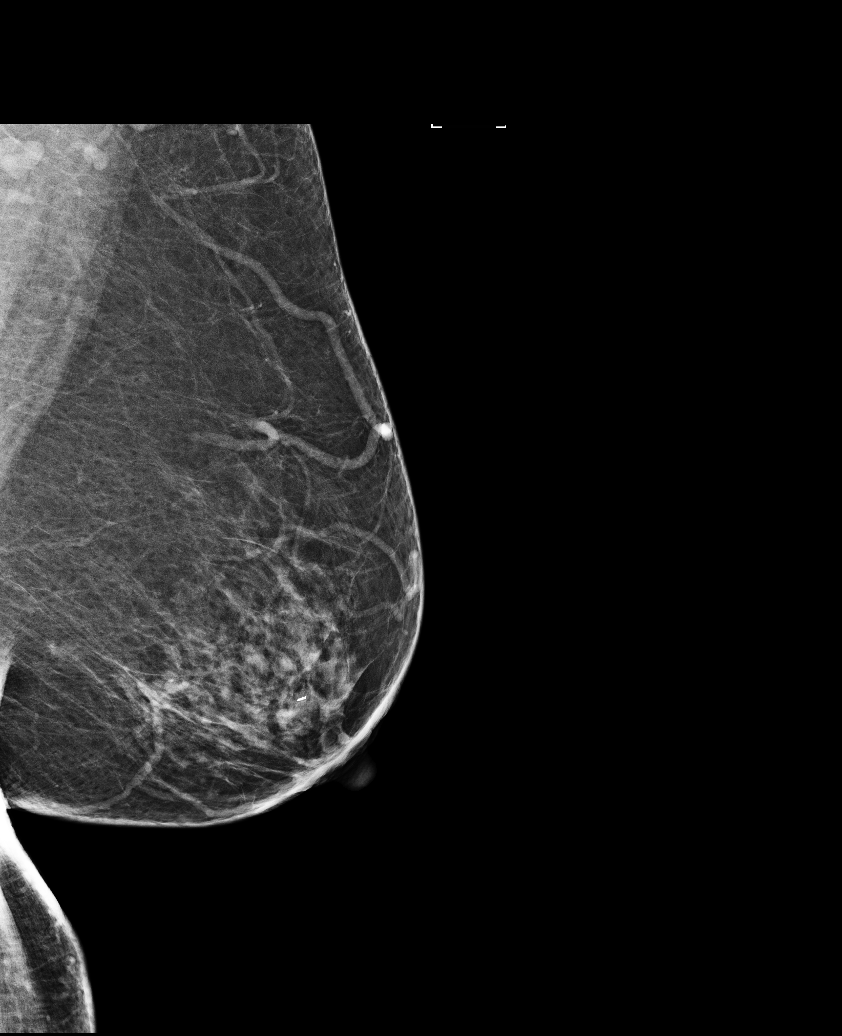

[L CC]
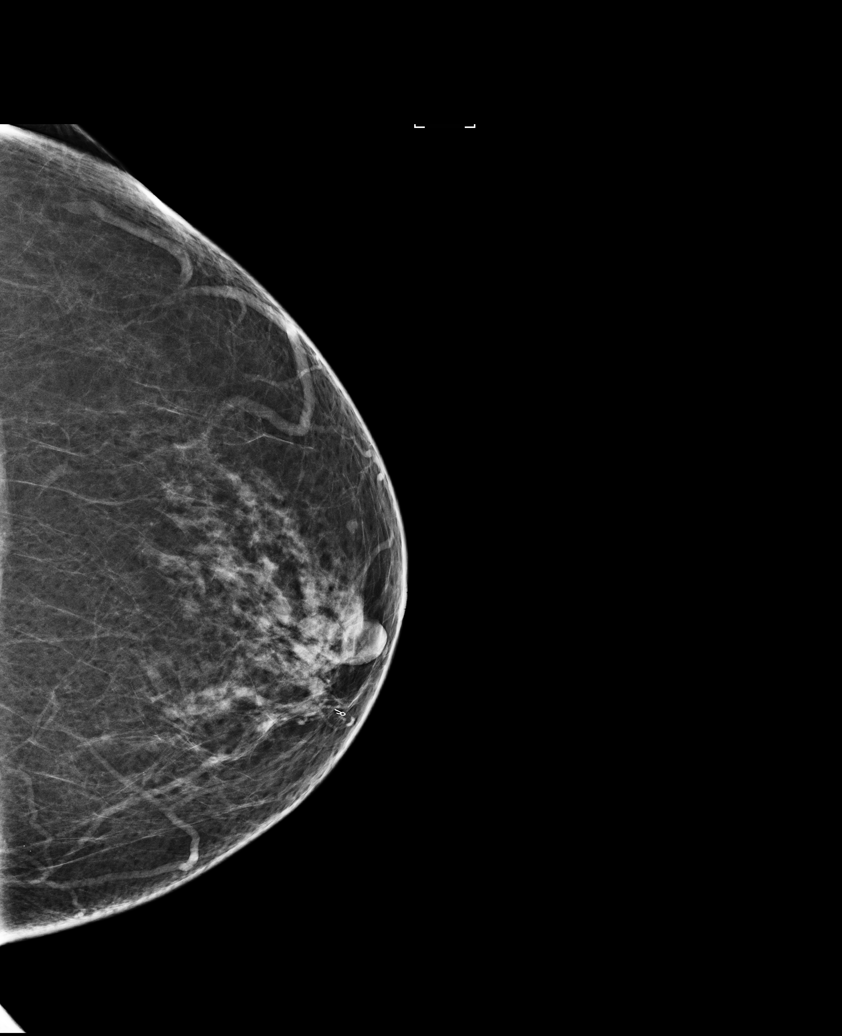

[L MLO (2 of 2)]
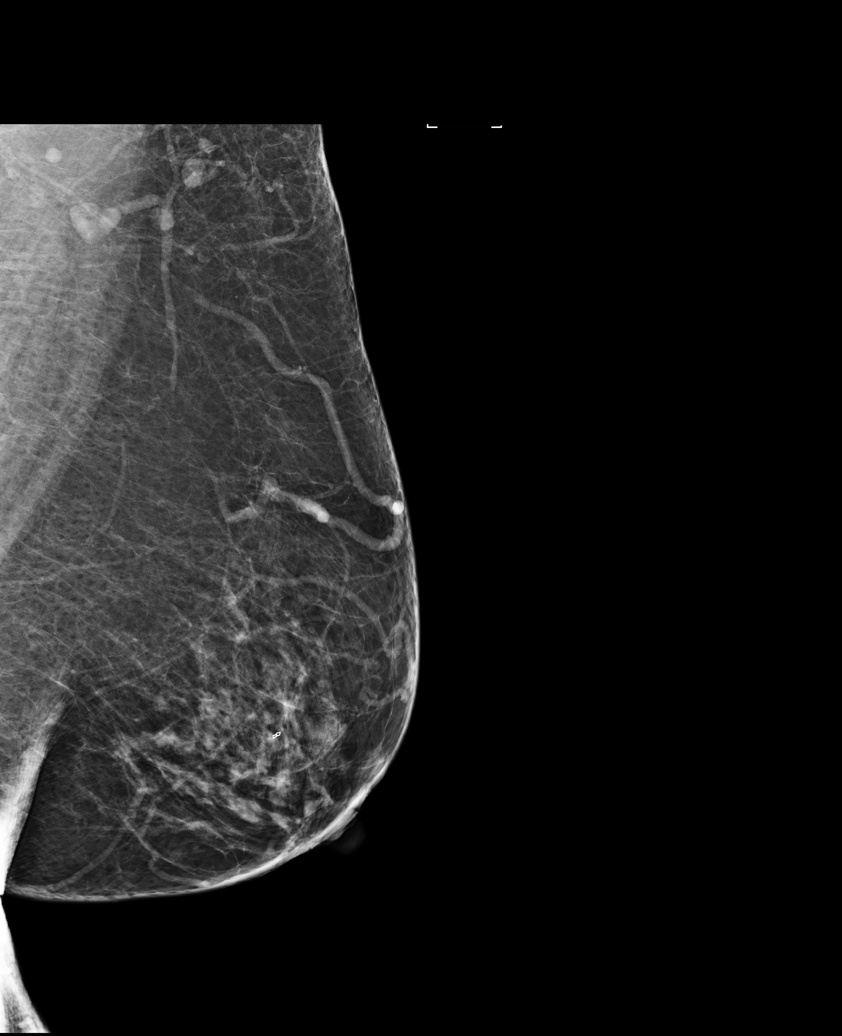

[R CC]
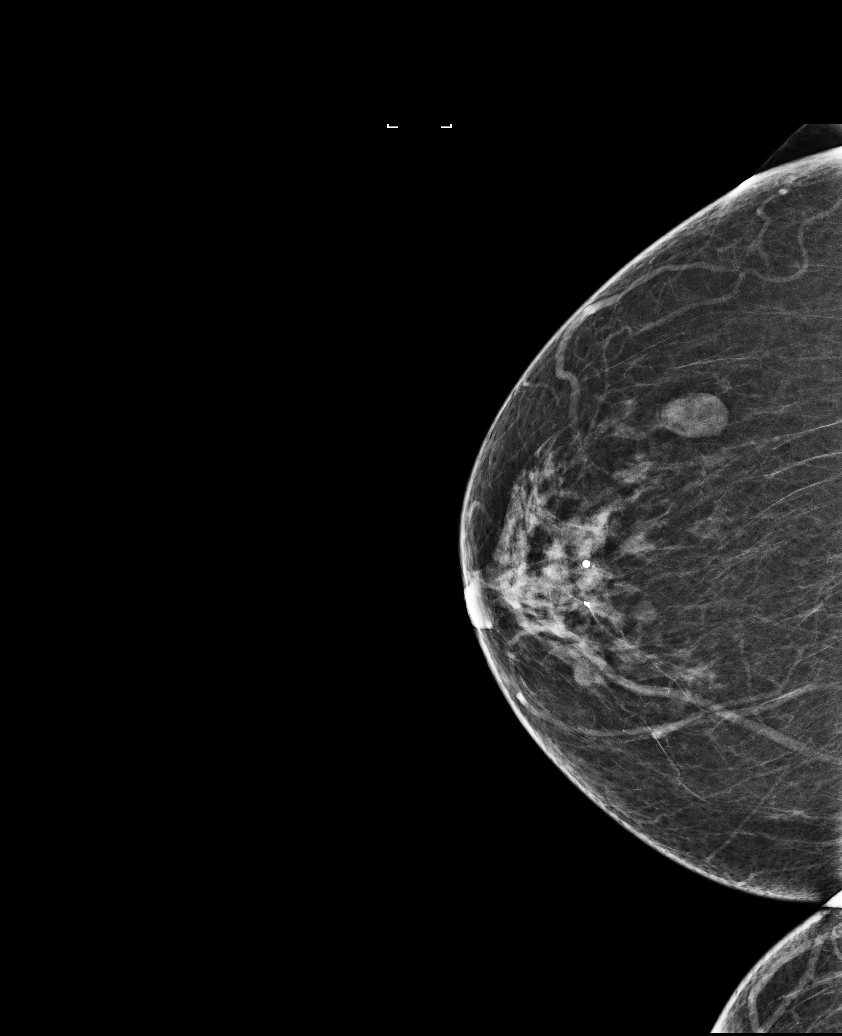

[R MLO]
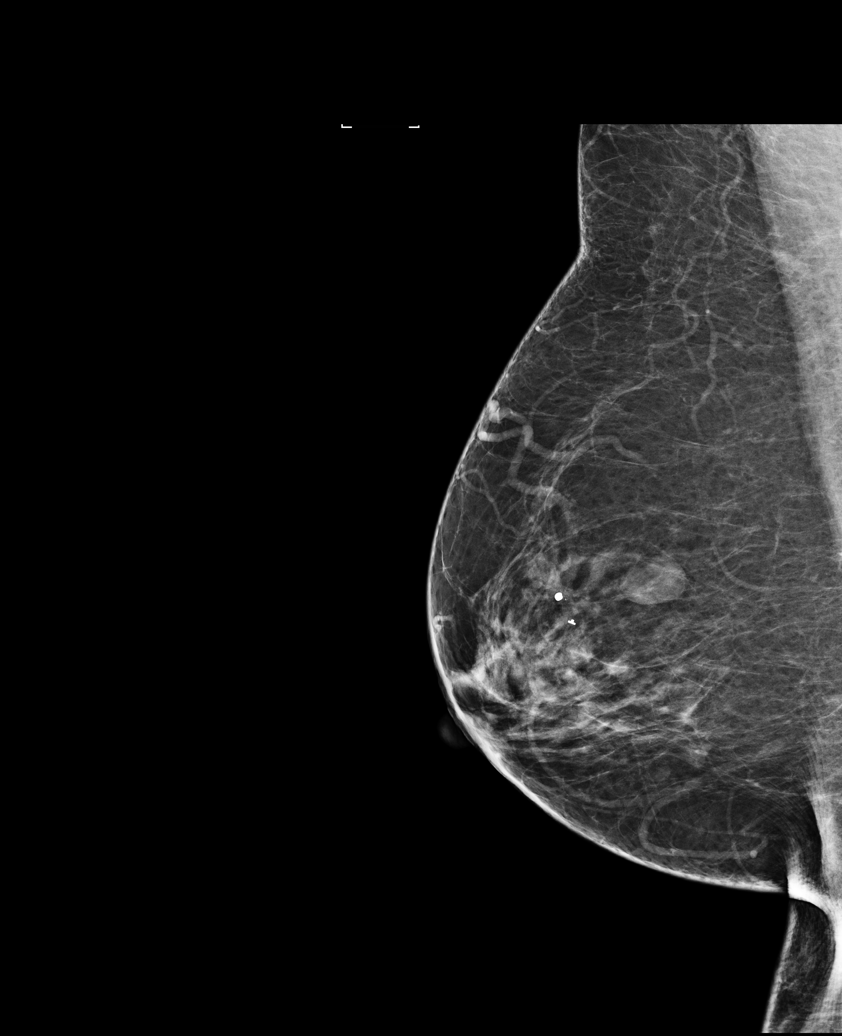

[5 of 5 positions shown; findings below may reference images not displayed]

ACR Breast Density Category c: The breast tissue is heterogeneously
dense, which may obscure small masses.
FINDINGS: There are no findings suspicious for malignancy. Images were
processed with CAD.
IMPRESSION: No mammographic evidence of malignancy. A result letter of this
screening mammogram will be mailed directly to the patient.

RECOMMENDATION:
Screening mammogram in one year. (Code:YJ-[id number])

BI-RADS CATEGORY  1: Negative.

## 2018-04-09 ENCOUNTER — Other Ambulatory Visit: Payer: Self-pay | Admitting: Unknown Physician Specialty

## 2018-04-11 NOTE — Telephone Encounter (Signed)
citalopram refill Last OV: 01/07/18 Last Refill:06/22/17 Pharmacy:[facility] [address] PCP: Kathrine Haddock NP

## 2018-04-13 DIAGNOSIS — R600 Localized edema: Secondary | ICD-10-CM | POA: Diagnosis not present

## 2018-04-13 DIAGNOSIS — I10 Essential (primary) hypertension: Secondary | ICD-10-CM | POA: Diagnosis not present

## 2018-04-13 DIAGNOSIS — Z905 Acquired absence of kidney: Secondary | ICD-10-CM | POA: Diagnosis not present

## 2018-04-13 DIAGNOSIS — N183 Chronic kidney disease, stage 3 (moderate): Secondary | ICD-10-CM | POA: Diagnosis not present

## 2018-04-14 LAB — BASIC METABOLIC PANEL: Potassium: 3.7 (ref 3.4–5.3)

## 2018-04-18 ENCOUNTER — Other Ambulatory Visit: Payer: Self-pay

## 2018-04-20 ENCOUNTER — Telehealth: Payer: Self-pay | Admitting: *Deleted

## 2018-04-20 NOTE — Telephone Encounter (Signed)
Left message for patient to notify them that it is time to schedule annual low dose lung cancer screening CT scan. Instructed patient to call back to verify information prior to the scan being scheduled.  

## 2018-04-21 ENCOUNTER — Telehealth: Payer: Self-pay | Admitting: *Deleted

## 2018-04-21 DIAGNOSIS — Z122 Encounter for screening for malignant neoplasm of respiratory organs: Secondary | ICD-10-CM

## 2018-04-21 DIAGNOSIS — Z87891 Personal history of nicotine dependence: Secondary | ICD-10-CM

## 2018-04-21 NOTE — Telephone Encounter (Signed)
Notified patient that annual lung cancer screening low dose CT scan is due currently or will be in near future. Confirmed that patient is within the age range of 55-77, and asymptomatic, (no signs or symptoms of lung cancer). Patient denies illness that would prevent curative treatment for lung cancer if found. Verified smoking history, (current, 41 pack year). The shared decision making visit was done 09/01/16. Patient is agreeable for CT scan being scheduled.

## 2018-04-27 ENCOUNTER — Ambulatory Visit
Admission: RE | Admit: 2018-04-27 | Discharge: 2018-04-27 | Disposition: A | Discharge status: Home / Self Care | Payer: 59 | Source: Ambulatory Visit | Attending: Urology | Admitting: Urology

## 2018-04-27 ENCOUNTER — Telehealth: Payer: Self-pay | Admitting: Urology

## 2018-04-27 DIAGNOSIS — I2510 Atherosclerotic heart disease of native coronary artery without angina pectoris: Secondary | ICD-10-CM | POA: Insufficient documentation

## 2018-04-27 DIAGNOSIS — Z122 Encounter for screening for malignant neoplasm of respiratory organs: Secondary | ICD-10-CM | POA: Diagnosis present

## 2018-04-27 DIAGNOSIS — Z85528 Personal history of other malignant neoplasm of kidney: Secondary | ICD-10-CM | POA: Diagnosis not present

## 2018-04-27 DIAGNOSIS — I700 Atherosclerosis of aorta: Secondary | ICD-10-CM | POA: Diagnosis not present

## 2018-04-27 DIAGNOSIS — C642 Malignant neoplasm of left kidney, except renal pelvis: Secondary | ICD-10-CM

## 2018-04-27 DIAGNOSIS — Z87891 Personal history of nicotine dependence: Secondary | ICD-10-CM

## 2018-04-27 DIAGNOSIS — Z905 Acquired absence of kidney: Secondary | ICD-10-CM | POA: Insufficient documentation

## 2018-04-27 DIAGNOSIS — D3501 Benign neoplasm of right adrenal gland: Secondary | ICD-10-CM | POA: Insufficient documentation

## 2018-04-27 DIAGNOSIS — J439 Emphysema, unspecified: Secondary | ICD-10-CM | POA: Insufficient documentation

## 2018-04-27 DIAGNOSIS — N281 Cyst of kidney, acquired: Secondary | ICD-10-CM

## 2018-04-27 DIAGNOSIS — Q991 46, XX true hermaphrodite: Secondary | ICD-10-CM

## 2018-04-27 NOTE — Telephone Encounter (Signed)
This patient had her kidney removed by you and Dr. Pilar Jarvis and he has been following her for repeat CT scan's etc. She was due for one today and and follow up appt with him tomorrow, however her kidney Dr. At [facility] does  Not want her to have contrast anymore and when she went to have her CT scan today the radiologist on call advised the tech not to perform the CT scan. Can you take a look at her chart and advise if she can have a CT scan without contrast and what we need to do as far as her care moving forward?   Thank you,  [name]

## 2018-04-27 NOTE — Telephone Encounter (Signed)
This is unfortunate.  I am not sure why this would be the case, because her creatinine looks stable from previous and she is had contrast before without issues.  I suppose we can change it to noncontrast CT abdomen pelvis as well as a renal ultrasound.  New orders placed.  Hollice Espy, MD

## 2018-04-27 NOTE — Telephone Encounter (Signed)
Thank you for your help with this   [name]

## 2018-04-28 ENCOUNTER — Ambulatory Visit: Payer: Medicare Other

## 2018-04-29 ENCOUNTER — Ambulatory Visit: Payer: 59

## 2018-04-29 ENCOUNTER — Inpatient Hospital Stay: Payer: 59 | Attending: Oncology

## 2018-04-29 DIAGNOSIS — C642 Malignant neoplasm of left kidney, except renal pelvis: Secondary | ICD-10-CM | POA: Diagnosis not present

## 2018-04-29 LAB — COMPREHENSIVE METABOLIC PANEL
ALT: 12 U/L — ABNORMAL LOW (ref 14–54)
ANION GAP: 8 (ref 5–15)
AST: 18 U/L (ref 15–41)
Albumin: 3.9 g/dL (ref 3.5–5.0)
Alkaline Phosphatase: 74 U/L (ref 38–126)
BUN: 30 mg/dL — ABNORMAL HIGH (ref 6–20)
CO2: 26 mmol/L (ref 22–32)
Calcium: 9.5 mg/dL (ref 8.9–10.3)
Chloride: 105 mmol/L (ref 101–111)
Creatinine, Ser: 1.49 mg/dL — ABNORMAL HIGH (ref 0.44–1.00)
GFR, EST AFRICAN AMERICAN: 41 mL/min — AB (ref >60)
GFR, EST NON AFRICAN AMERICAN: 36 mL/min — AB (ref >60)
Glucose, Bld: 116 mg/dL — ABNORMAL HIGH (ref 65–99)
POTASSIUM: 4 mmol/L (ref 3.5–5.1)
Sodium: 139 mmol/L (ref 135–145)
TOTAL PROTEIN: 7.6 g/dL (ref 6.5–8.1)
Total Bilirubin: 0.4 mg/dL (ref 0.3–1.2)

## 2018-04-29 LAB — CBC WITH DIFFERENTIAL/PLATELET
BASOS ABS: 0 10*3/uL (ref 0–0.1)
Basophils Relative: 1 %
EOS PCT: 2 %
Eosinophils Absolute: 0.1 10*3/uL (ref 0–0.7)
HCT: 31.6 % — ABNORMAL LOW (ref 35.0–47.0)
Hemoglobin: 10.9 g/dL — ABNORMAL LOW (ref 12.0–16.0)
LYMPHS PCT: 33 %
Lymphs Abs: 2.4 10*3/uL (ref 1.0–3.6)
MCH: 30.5 pg (ref 26.0–34.0)
MCHC: 34.5 g/dL (ref 32.0–36.0)
MCV: 88.5 fL (ref 80.0–100.0)
Monocytes Absolute: 0.6 10*3/uL (ref 0.2–0.9)
Monocytes Relative: 8 %
NEUTROS PCT: 56 %
Neutro Abs: 4.1 10*3/uL (ref 1.4–6.5)
PLATELETS: 278 10*3/uL (ref 150–440)
RBC: 3.57 MIL/uL — AB (ref 3.80–5.20)
RDW: 14.5 % (ref 11.5–14.5)
WBC: 7.3 10*3/uL (ref 3.6–11.0)

## 2018-04-30 NOTE — Progress Notes (Signed)
[place]  [phone] [phone]) [phone]  ID: Sarah [name] OB: 12-13-1950  MR#: [id number]  QIO#:[id number]  Patient Care Team: Kathrine Haddock, NP as PCP - General (Nurse Practitioner) Nickie Retort, MD as Consulting Physician (Urology) Lloyd Huger, MD as Consulting Physician (Oncology)  CHIEF COMPLAINT: Stage III renal cell carcinoma, left.  INTERVAL HISTORY: Patient returns to clinic today for routine six-month evaluation.  CT scan was rescheduled for later this week and switch to a noncontrast CT secondary to increasing creatinine.  She did have a lung screening chest CT that evaluate part of the kidney and did not reveal any progressive or metastatic disease.  She currently feels well and is asymptomatic. She has no neurologic complaints. She denies any recent fevers or illnesses. She has a good appetite and denies weight loss. She has no chest pain or shortness of breath.  She has no abdominal pain.  She denies any nausea, vomiting, constipation, or diarrhea. She has no urinary complaints.  Patient offers no specific complaints today.  REVIEW OF SYSTEMS:   Review of Systems  Constitutional: Negative.  Negative for fever, malaise/fatigue and weight loss.  Respiratory: Negative.  Negative for cough and shortness of breath.   Cardiovascular: Negative.  Negative for chest pain and leg swelling.  Gastrointestinal: Negative.  Negative for abdominal pain.  Genitourinary: Negative.  Negative for flank pain and hematuria.  Musculoskeletal: Negative.  Negative for back pain.  Skin: Negative.  Negative for rash.  Neurological: Negative.  Negative for sensory change, focal weakness and weakness.  Psychiatric/Behavioral: Negative.  The patient is not nervous/anxious.     As per HPI. Otherwise, a complete review of systems is negative.  PAST MEDICAL HISTORY: Past Medical History:  Diagnosis Date  . Abnormal mammogram, unspecified   .  Aneurysm ([place]) 2004   cerebral x  4, [facility]  . Anxiety   . Arthritis   . Benign neoplasm of breast 2012  . Breast complaint 2010  . Chronic kidney disease    Renal Cell Cancer  . Hypertension   . Obesity, unspecified   . Personal history of tobacco use, presenting hazards to health   . Renal cancer, left ([place]) 11/2016   Renal Cell with Left nephrectomy.  Marland Kitchen Special screening for malignant neoplasms, colon     PAST SURGICAL HISTORY: Past Surgical History:  Procedure Laterality Date  . ABDOMINAL HYSTERECTOMY  1993  . BREAST BIOPSY Right 2012   stereo bx showing a 32mm radial scar in an estimated 8 volume of tissue  . BREAST BIOPSY Right Apr 09, 2005   Fibrocystic changes, ductal adenosis, microcalcifications, focal stromal/epithelial proliferation, possible fibroadenoma type proliferation  . CEREBRAL ANEURYSM REPAIR  2004  . COLONOSCOPY  2008   Dr. Nicolasa Ducking  . EYE SURGERY    . FOOT SURGERY Right 2003  . LAPAROSCOPIC NEPHRECTOMY, HAND ASSISTED Left 01/01/2017   Procedure: HAND ASSISTED LAPAROSCOPIC NEPHRECTOMY;  Surgeon: Nickie Retort, MD;  Location: [facility] ORS;  Service: Urology;  Laterality: Left;  . TONSILLECTOMY  1997    FAMILY HISTORY: Family History  Problem Relation Age of Onset  . Cancer Cousin        maternal first cousin with breast cancer  . Cancer Other        colon and ovarian cancers, no relationship listed  . Cancer Father        prostate  . Hypertension Father   . Hypertension Sister   . Hypertension Brother   .  Hypertension Paternal Grandmother   . Heart disease Paternal Grandmother   . Hypertension Paternal Grandfather   . Heart disease Paternal Grandfather     ADVANCED DIRECTIVES (Y/N):  N  HEALTH MAINTENANCE: Social History   Tobacco Use  . Smoking status: Current Every Day Smoker    Packs/day: 0.25    Years: 40.00    Pack years: 10.00    Types: Cigarettes  . Smokeless tobacco: Never Used  Substance Use Topics  . Alcohol use: Yes      Comment: pt states she is not currently having any alcohol  . Drug use: No     Colonoscopy:  PAP:  Bone density:  Lipid panel:  Allergies  Allergen Reactions  . Codeine Itching    Current Outpatient Medications  Medication Sig Dispense Refill  . acetaminophen (TYLENOL) 325 MG tablet Take 650 mg by mouth every 6 (six) hours as needed for mild pain, moderate pain or headache.     Marland Kitchen amLODipine (NORVASC) 5 MG tablet TAKE 1 TABLET BY MOUTH EVERY DAY 90 tablet 1  . atenolol (TENORMIN) 100 MG tablet TAKE 1 TABLET BY MOUTH EVERY DAY 90 tablet 1  . atorvastatin (LIPITOR) 40 MG tablet Take 1 tablet (40 mg total) by mouth daily. 90 tablet 3  . benazepril-hydrochlorthiazide (LOTENSIN HCT) 20-25 MG tablet Take 1 tablet by mouth daily. 90 tablet 1  . benazepril-hydrochlorthiazide (LOTENSIN HCT) 20-25 MG tablet TAKE 1 TABLET BY MOUTH DAILY. 90 tablet 0  . Cholecalciferol (VITAMIN D-3 PO) Take 600 Units by mouth daily.    . citalopram (CELEXA) 20 MG tablet TAKE 1 TABLET BY MOUTH EVERYDAY AT BEDTIME 90 tablet 1  . Multiple Vitamin (MULTIVITAMIN) tablet Take 1 tablet by mouth daily.    . traMADol (ULTRAM) 50 MG tablet Take 1 tablet (50 mg total) by mouth every 12 (twelve) hours as needed. 30 tablet 1   No current facility-administered medications for this visit.     OBJECTIVE: Vitals:   05/03/18 1050  BP: (!) 151/74  Pulse: 65  Resp: 20  Temp: (!) 97.3 F (36.3 C)     Body mass index is 35.85 kg/m.    ECOG FS:0 - Asymptomatic  General: Well-developed, well-nourished, no acute distress. Eyes: Pink conjunctiva, anicteric sclera. Lungs: Clear to auscultation bilaterally. Heart: Regular rate and rhythm. No rubs, murmurs, or gallops. Abdomen: Soft, nontender, nondistended. No organomegaly noted, normoactive bowel sounds. Musculoskeletal: No edema, cyanosis, or clubbing. Neuro: Alert, answering all questions appropriately. Cranial nerves grossly intact. Skin: No rashes or petechiae  noted. Psych: Normal affect.  LAB RESULTS:  Lab Results  Component Value Date   NA 139 04/29/2018   K 4.0 04/29/2018   CL 105 04/29/2018   CO2 26 04/29/2018   GLUCOSE 116 (H) 04/29/2018   BUN 30 (H) 04/29/2018   CREATININE 1.49 (H) 04/29/2018   CALCIUM 9.5 04/29/2018   PROT 7.6 04/29/2018   ALBUMIN 3.9 04/29/2018   AST 18 04/29/2018   ALT 12 (L) 04/29/2018   ALKPHOS 74 04/29/2018   BILITOT 0.4 04/29/2018   GFRNONAA 36 (L) 04/29/2018   GFRAA 41 (L) 04/29/2018    Lab Results  Component Value Date   WBC 7.3 04/29/2018   NEUTROABS 4.1 04/29/2018   HGB 10.9 (L) 04/29/2018   HCT 31.6 (L) 04/29/2018   MCV 88.5 04/29/2018   PLT 278 04/29/2018     STUDIES: Ct Chest Lung Cancer Screening Low Dose Wo Contrast  Result Date: 04/27/2018 CLINICAL DATA:  67 year old asymptomatic female  current smoker with 41 pack-year smoking history. History left renal cell carcinoma status post left nephrectomy in 2018. EXAM: CT CHEST WITHOUT CONTRAST LOW-DOSE FOR LUNG CANCER SCREENING TECHNIQUE: Multidetector CT imaging of the chest was performed following the standard protocol without IV contrast. COMPARISON:  09/01/2016 screening chest CT.  04/16/2017 chest CT. FINDINGS: Cardiovascular: Top-normal heart size. No significant pericardial effusion/thickening. Left anterior descending and right coronary atherosclerosis. Atherosclerotic nonaneurysmal thoracic aorta. Stable dilated main pulmonary artery (3.8 cm diameter). Mediastinum/Nodes: No discrete thyroid nodules. Unremarkable esophagus. No pathologically enlarged axillary, mediastinal or hilar lymph nodes, noting limited sensitivity for the detection of hilar adenopathy on this noncontrast study. Lungs/Pleura: No pneumothorax. No pleural effusion. Mild centrilobular emphysema. No acute consolidative airspace disease or lung masses. No significant growth of any of the previously visualized scattered pulmonary nodules. Solid pulmonary nodule in the apical  left upper lobe measuring 3.3 mm in volume derived mean diameter (series 3/image 47) is new since 09/01/2016 screening chest CT. Upper abdomen: Right adrenal 2.6 cm nodule with density 21 HU, stable in size since 09/01/2016 CT, most compatible with a benign adenoma. Multiple renal cortical masses with indeterminate density in the visualized right kidney, the largest an exophytic 3.2 cm posterior upper right renal mass (series 2/image 52), stable in size since 04/16/2017 CT abdomen study, where they were characterized as hemorrhagic cysts. Exophytic simple 2.0 cm posterior far upper right renal cyst. Musculoskeletal: No aggressive appearing focal osseous lesions. Marked thoracic spondylosis. IMPRESSION: 1. Lung-RADS 2, benign appearance or behavior. Continue annual screening with low-dose chest CT without contrast in 12 months. 2. Two-vessel coronary atherosclerosis. 3. Stable dilated main pulmonary artery, suggesting chronic pulmonary arterial hypertension. 4. Stable right adrenal adenoma. Aortic Atherosclerosis (ICD10-I70.0) and Emphysema (ICD10-J43.9). Electronically Signed   By: Ilona Sorrel M.D.   On: 04/27/2018 15:34    ASSESSMENT: Stage III renal cell carcinoma, left.  PLAN:  1. Stage III renal cell carcinoma, left:  CT scan results from October 25, 2017 reviewed independently  with no evidence of recurrent or metastatic disease. According NCCN guidelines, patient will require abdominal/pelvic CT scan approximately every 6 months for 3 years after her nephrectomy which occurred on January 01, 2017. She then will require annual imaging for a total of 5 years.  Patient CT scan is scheduled for May 06, 2018.  Lung screening chest CT reviewed independently report as above did not reveal any suspicious pulmonary nodules and no evidence of progressive or recurrent renal cell carcinoma.  Return to clinic in 6 months with repeat CT scan and further evaluation.   2. Anemia: Stable, monitor.  3. Chronic renal  insufficiency: Patient's creatinine appears to be approximately her baseline. Appreciate nephrology input.  Approximately 20 minutes was spent in discussion of which greater than 50% was consultation.  Patient expressed understanding and was in agreement with this plan. She also understands that She can call clinic at any time with any questions, concerns, or complaints.   Cancer Staging Renal cancer, left [facility]) Staging form: Kidney, AJCC 8th Edition - Clinical stage from 01/26/2017: Stage III (cT3a, cNX, cM0) - Signed by Lloyd Huger, MD on 01/26/2017   Lloyd Huger, MD   05/04/2018 11:33 PM

## 2018-05-02 ENCOUNTER — Encounter: Payer: Self-pay | Admitting: *Deleted

## 2018-05-03 ENCOUNTER — Encounter: Payer: Self-pay | Admitting: Oncology

## 2018-05-03 ENCOUNTER — Ambulatory Visit: Payer: Self-pay | Admitting: Oncology

## 2018-05-03 ENCOUNTER — Inpatient Hospital Stay: Payer: 59 | Attending: Oncology | Admitting: Oncology

## 2018-05-03 VITALS — BP 151/74 | HR 65 | Temp 97.3°F | Resp 20 | Wt 222.1 lb

## 2018-05-03 DIAGNOSIS — C642 Malignant neoplasm of left kidney, except renal pelvis: Secondary | ICD-10-CM | POA: Diagnosis not present

## 2018-05-03 DIAGNOSIS — N189 Chronic kidney disease, unspecified: Secondary | ICD-10-CM | POA: Diagnosis not present

## 2018-05-03 DIAGNOSIS — D649 Anemia, unspecified: Secondary | ICD-10-CM

## 2018-05-03 DIAGNOSIS — Z720 Tobacco use: Secondary | ICD-10-CM | POA: Diagnosis not present

## 2018-05-03 NOTE — Progress Notes (Signed)
Patient denies any concerns today.  

## 2018-05-06 ENCOUNTER — Ambulatory Visit
Admission: RE | Admit: 2018-05-06 | Discharge: 2018-05-06 | Disposition: A | Discharge status: Home / Self Care | Payer: Commercial Managed Care - HMO | Source: Ambulatory Visit | Attending: Urology | Admitting: Urology

## 2018-05-06 DIAGNOSIS — I700 Atherosclerosis of aorta: Secondary | ICD-10-CM | POA: Insufficient documentation

## 2018-05-06 DIAGNOSIS — N281 Cyst of kidney, acquired: Secondary | ICD-10-CM | POA: Diagnosis not present

## 2018-05-06 DIAGNOSIS — D3501 Benign neoplasm of right adrenal gland: Secondary | ICD-10-CM | POA: Insufficient documentation

## 2018-05-06 DIAGNOSIS — J479 Bronchiectasis, uncomplicated: Secondary | ICD-10-CM | POA: Insufficient documentation

## 2018-05-06 DIAGNOSIS — K5730 Diverticulosis of large intestine without perforation or abscess without bleeding: Secondary | ICD-10-CM | POA: Diagnosis not present

## 2018-05-06 DIAGNOSIS — K8020 Calculus of gallbladder without cholecystitis without obstruction: Secondary | ICD-10-CM | POA: Insufficient documentation

## 2018-05-06 DIAGNOSIS — C642 Malignant neoplasm of left kidney, except renal pelvis: Secondary | ICD-10-CM

## 2018-05-06 DIAGNOSIS — Z905 Acquired absence of kidney: Secondary | ICD-10-CM | POA: Insufficient documentation

## 2018-05-06 DIAGNOSIS — J439 Emphysema, unspecified: Secondary | ICD-10-CM | POA: Diagnosis not present

## 2018-05-10 ENCOUNTER — Ambulatory Visit (INDEPENDENT_AMBULATORY_CARE_PROVIDER_SITE_OTHER): Payer: 59 | Admitting: Urology

## 2018-05-10 ENCOUNTER — Encounter: Payer: Self-pay | Admitting: Urology

## 2018-05-10 VITALS — BP 145/77 | HR 74 | Ht 66.0 in | Wt 217.8 lb

## 2018-05-10 DIAGNOSIS — N281 Cyst of kidney, acquired: Secondary | ICD-10-CM | POA: Diagnosis not present

## 2018-05-10 DIAGNOSIS — K458 Other specified abdominal hernia without obstruction or gangrene: Secondary | ICD-10-CM | POA: Diagnosis not present

## 2018-05-10 DIAGNOSIS — C642 Malignant neoplasm of left kidney, except renal pelvis: Secondary | ICD-10-CM

## 2018-05-10 NOTE — Progress Notes (Signed)
05/10/2018 5:46 PM   [name] 1951-08-28 [id number]  Referring provider: Kathrine Haddock, NP [address], [place] [address]  Chief Complaint  Patient presents with  . Follow-up    history of RCC    HPI: 67 year old female who presents today for routine kidney cancer follow-up.  She underwent left laparoscopic hand-assisted nephrectomy on 01/01/2017 by Dr. Baruch Gouty.  Surgical pathology c/w multifocal clear cell renal cell carcinoma. 3 distinct tumors were seen. 2 were cystic and the third was solid. There was tumor extension into the renal sidedness. Margins were negative. Lymphovascular invasion was not identified. Histologic grade 2. Greatest dimension was 2.1 cm. Stage pT3a.  She returns the office today with follow-up CT scan abdomen pelvis without contrast as well as renal ultrasound.  This shows no obvious evidence of recurrence.  Her renal ultrasound shows multiple complex cystic lesions in the left kidney without any concerning features.  Chest CT negative.    Incidentally on CT abdomen with contrast, loops of small bowel were noted to be lateral to the hepatic flexure concerning for internal hernia although there were no signs of bowel stimulation or ischemia.  She does report that she occasionally has bouts of abdominal pain and loose stool occurring approximately once per week.  She is also followed with Dr. Grayland Ormond (oncology) for routine surveillance.  No flank pain or gross hematuria.    Cr stable 1.49 as of 03/2018.  GFR 41.  Followed by nephrology.  Advised to no longer receive IV contrast.   PMH: Past Medical History:  Diagnosis Date  . Abnormal mammogram, unspecified   . Aneurysm ([place]) 2004   cerebral x  4, [facility]  . Anxiety   . Arthritis   . Benign neoplasm of breast 2012  . Breast complaint 2010  . Chronic kidney disease    Renal Cell Cancer  . Hypertension   . Obesity, unspecified   . Personal history of tobacco use, presenting hazards  to health   . Renal cancer, left ([place]) 11/2016   Renal Cell with Left nephrectomy.  Marland Kitchen Special screening for malignant neoplasms, colon     Surgical History: Past Surgical History:  Procedure Laterality Date  . ABDOMINAL HYSTERECTOMY  1993  . BREAST BIOPSY Right 2012   stereo bx showing a 75mm radial scar in an estimated 8 volume of tissue  . BREAST BIOPSY Right Apr 09, 2005   Fibrocystic changes, ductal adenosis, microcalcifications, focal stromal/epithelial proliferation, possible fibroadenoma type proliferation  . CEREBRAL ANEURYSM REPAIR  2004  . COLONOSCOPY  2008   Dr. Nicolasa Ducking  . EYE SURGERY    . FOOT SURGERY Right 2003  . LAPAROSCOPIC NEPHRECTOMY, HAND ASSISTED Left 01/01/2017   Procedure: HAND ASSISTED LAPAROSCOPIC NEPHRECTOMY;  Surgeon: Nickie Retort, MD;  Location: [facility] ORS;  Service: Urology;  Laterality: Left;  . TONSILLECTOMY  1997    Home Medications:  Allergies as of 05/10/2018      Reactions   Codeine Itching      Medication List        Accurate as of 05/10/18  5:46 PM. Always use your most recent med list.          acetaminophen 325 MG tablet Commonly known as:  TYLENOL Take 650 mg by mouth every 6 (six) hours as needed for mild pain, moderate pain or headache.   amLODipine 5 MG tablet Commonly known as:  NORVASC TAKE 1 TABLET BY MOUTH EVERY DAY   atenolol 100 MG tablet  Commonly known as:  TENORMIN TAKE 1 TABLET BY MOUTH EVERY DAY   atorvastatin 40 MG tablet Commonly known as:  LIPITOR Take 1 tablet (40 mg total) by mouth daily.   benazepril-hydrochlorthiazide 20-25 MG tablet Commonly known as:  LOTENSIN HCT Take 1 tablet by mouth daily.   benazepril-hydrochlorthiazide 20-25 MG tablet Commonly known as:  LOTENSIN HCT TAKE 1 TABLET BY MOUTH DAILY.   citalopram 20 MG tablet Commonly known as:  CELEXA TAKE 1 TABLET BY MOUTH EVERYDAY AT BEDTIME   multivitamin tablet Take 1 tablet by mouth daily.   traMADol 50 MG tablet Commonly known as:   ULTRAM Take 1 tablet (50 mg total) by mouth every 12 (twelve) hours as needed.   VITAMIN D-3 PO Take 600 Units by mouth daily.       Allergies:  Allergies  Allergen Reactions  . Codeine Itching    Family History: Family History  Problem Relation Age of Onset  . Cancer Cousin        maternal first cousin with breast cancer  . Cancer Other        colon and ovarian cancers, no relationship listed  . Cancer Father        prostate  . Hypertension Father   . Hypertension Sister   . Hypertension Brother   . Hypertension Paternal Grandmother   . Heart disease Paternal Grandmother   . Hypertension Paternal Grandfather   . Heart disease Paternal Grandfather     Social History:  reports that she has been smoking cigarettes.  She has a 10.00 pack-year smoking history. She has never used smokeless tobacco. She reports that she drinks alcohol. She reports that she does not use drugs.  ROS: UROLOGY Frequent Urination?: No Hard to postpone urination?: No Burning/pain with urination?: No Get up at night to urinate?: No Leakage of urine?: No Urine stream starts and stops?: No Trouble starting stream?: No Do you have to strain to urinate?: No Blood in urine?: No Urinary tract infection?: No Sexually transmitted disease?: No Injury to kidneys or bladder?: No Painful intercourse?: No Weak stream?: No Currently pregnant?: No Vaginal bleeding?: No Last menstrual period?: n  Gastrointestinal Nausea?: No Vomiting?: No Indigestion/heartburn?: No Diarrhea?: Yes Constipation?: No  Constitutional Fever: No Night sweats?: No Weight loss?: No Fatigue?: No  Skin Skin rash/lesions?: No Itching?: No  Eyes Blurred vision?: No Double vision?: No  Ears/Nose/Throat Sore throat?: No Sinus problems?: No  Hematologic/Lymphatic Swollen glands?: No Easy bruising?: No  Cardiovascular Leg swelling?: No Chest pain?: No  Respiratory Cough?: No Shortness of breath?:  No  Endocrine Excessive thirst?: No  Musculoskeletal Back pain?: No Joint pain?: No  Neurological Headaches?: No Dizziness?: No  Psychologic Depression?: No Anxiety?: No  Physical Exam: BP (!) 145/77 (BP Location: Right Arm, Patient Position: Sitting, Cuff Size: Large)   Pulse 74   Ht 5\' 6"  (1.676 m)   Wt 217 lb 12.8 oz (98.8 kg)   SpO2 (!) 64%   BMI 35.15 kg/m   Constitutional:  Alert and oriented, No acute distress. HEENT: [place] AT, moist mucus membranes.  Trachea midline, no masses. Cardiovascular: No clubbing, cyanosis, or edema. Respiratory: Normal respiratory effort, no increased work of breathing. GI: Abdomen is soft, nontender, nondistended, no abdominal masses, obese.  No hernias.  Incision is well-healed. GU: No CVA tenderness Skin: No rashes, bruises or suspicious lesions. Neurologic: Grossly intact, no focal deficits, moving all 4 extremities. Psychiatric: Normal mood and affect.  Laboratory Data: Lab Results  Component Value  Date   WBC 7.3 04/29/2018   HGB 10.9 (L) 04/29/2018   HCT 31.6 (L) 04/29/2018   MCV 88.5 04/29/2018   PLT 278 04/29/2018    Lab Results  Component Value Date   CREATININE 1.49 (H) 04/29/2018    Urinalysis N/a  Pertinent Imaging: Results for orders placed during the hospital encounter of 05/06/18  US RENAL   Narrative CLINICAL DATA:  Complex right renal cysts.  Prior left nephrectomy.  EXAM: RENAL / URINARY TRACT ULTRASOUND COMPLETE  COMPARISON:  10/25/2017 CT  FINDINGS: Right Kidney:  Length: 12.4 cm. Echogenicity within normal limits. Multiple complex right renal lesions including an upper pole 3.3 by 2.5 by 1.5 cm lesion or cluster of lesions; a separate right kidney upper pole 3.5 by 3.0 by 3.7 cm mildly complex cystic lesions; and a 2.9 by 1.6 by 2.3 cm cluster of complex lesions in the lower pole.  A right suprarenal mass corresponding to the adenoma shown on prior exams measures 2.7 by 2.3 by 2.7 cm.  No  obvious hypervascular mass of the right kidney is identified.  Left Kidney:  Left nephrectomy.  Bladder:  Appears normal for degree of bladder distention. Right ureteral jet noted.  IMPRESSION: 1. Multiple complex lesions of the right kidney without observed abnormal Doppler signal, favoring complex cysts based on prior imaging. Surveillance is likely warranted. 2. Right adrenal mass, stable, previously characterized as an adenoma. 3. Left nephrectomy.   Electronically Signed   By: Van Clines M.D.   On: 05/09/2018 08:48    CLINICAL DATA:  Left renal cell carcinoma restaging. Prior left nephrectomy.  EXAM: CT ABDOMEN AND PELVIS WITHOUT CONTRAST  TECHNIQUE: Multidetector CT imaging of the abdomen and pelvis was performed following the standard protocol without IV contrast.  COMPARISON:  Multiple exams, including 10/25/2017 and 04/27/2018  FINDINGS: Lower chest: Emphysema. Mild right middle lobe scarring. Cylindrical bronchiectasis in both lower lobes without airway thickening.  Hepatobiliary: Faint rim density in the gallbladder favoring gallstone. Otherwise unremarkable.  Pancreas: Unremarkable  Spleen: Unremarkable  Adrenals/Urinary Tract: 3.1 by 2.1 cm right adrenal mass with precontrast density of 31 Hounsfield units, nonspecific but not changed in size from 10/25/2017. Previously characterized as adrenal adenoma based on MRI. Left nephrectomy without visible local recurrence.  At least 15 complex/hyperdense lesions of the right kidney currently not demonstrating appreciable change in size from 10/25/2017.  Stomach/Bowel: Descending and sigmoid colon diverticulosis. Small bowel loop substantially to the left of the splenic flexure raising suspicion for post nephrectomy internal hernia through the descending mesocolon, without current complicating feature.  Vascular/Lymphatic: Aortoiliac atherosclerotic vascular disease. Infrarenal  abdominal aortic ectasia.  Reproductive: Uterus absent.  Adnexa unremarkable.  Other: No supplemental non-categorized findings.  Musculoskeletal: Lower lumbar spondylosis and degenerative disc disease noted with mild bilateral impingement at L5-S1.  IMPRESSION: 1. No findings of recurrent malignancy. There are at least 15 complex/hyperdense lesions of the right kidney which have previously appeared to represent complex cysts, and which have not appreciably increased in size. Today's exam was performed without IV contrast and accordingly enhancement of these lesions is not assessed. 2. Loops of small bowel far lateral to the hepatic flexure, raising suspicion for post nephrectomy internal hernia through the descending mesocolon, without current complicating feature such as strangulation identified. 3. Other imaging findings of potential clinical significance: Emphysema. Right middle lobe scarring. Cylindrical bronchiectasis. Cholelithiasis. Right adrenal adenoma. Descending and sigmoid colon diverticulosis. Aortic Atherosclerosis (ICD10-I70.0). Lumbar impingement at L5-S1.   Electronically Signed   By: Thayer Jew  Janeece Fitting M.D.   On: 05/09/2018 08:44  Imaging from CT abdomen pelvis without contrast as well as renal ultrasound was personally reviewed today.  Report for CT chest was reviewed.  Assessment & Plan:    1. Renal cell carcinoma of left kidney (HCC) NED Continue every 6 month imaging We discussed that imaging both with Dr. Grayland Ormond as well as [facility] urological is redundant, will defer further imaging to Dr. Grayland Ormond for the time being  2. Acquired renal cyst of right kidney Recommend RUS annually to follow these prostate 2/12 lesions given that she is unable to receive any further IV contrast  3. Internal hernia Incidental finding on CT scan, no evidence of obstruction We discussed signs and symptoms of bowel obstruction and indication for further  evaluation/treatment She was provided a handout today as well outlining this Unclear if symptoms reported above related, unlikely   Return in about 1 year (around 05/11/2019) for RUS.  Hollice Espy, MD  [facility] [address], [place], [place] [address] [phone]

## 2018-05-10 NOTE — Patient Instructions (Signed)
Small Bowel Obstruction °A small bowel obstruction means that something is blocking the small bowel. The small bowel is also called the small intestine. It is the long tube that connects the stomach to the colon. An obstruction will stop food and fluids from passing through the small bowel. Treatment depends on what is causing the problem and how bad the problem is. °Follow these instructions at home: °· Get a lot of rest. °· Follow your diet as told by your doctor. You may need to: °? Only drink clear liquids until you start to get better. °? Avoid solid foods as told by your doctor. °· Take over-the-counter and prescription medicines only as told by your doctor. °· Keep all follow-up visits as told by your doctor. This is important. °Contact a doctor if: °· You have a fever. °· You have chills. °Get help right away if: °· You have pain or cramps that get worse. °· You throw up (vomit) blood. °· You have a feeling of being sick to your stomach (nausea) that does not go away. °· You cannot stop throwing up. °· You cannot drink fluids. °· You feel confused. °· You feel dry or thirsty (dehydrated). °· Your belly gets more bloated. °· You feel weak or you pass out (faint). °This information is not intended to replace advice given to you by your health care provider. Make sure you discuss any questions you have with your health care provider. °Document Released: 12/24/2004 Document Revised: 07/13/2016 Document Reviewed: 01/10/2015 °Elsevier Interactive Patient Education © 2018 [employer] ° °

## 2018-05-19 ENCOUNTER — Ambulatory Visit (INDEPENDENT_AMBULATORY_CARE_PROVIDER_SITE_OTHER): Payer: 59

## 2018-05-19 VITALS — BP 123/72 | HR 68 | Temp 97.9°F | Resp 16 | Ht 63.5 in | Wt 219.4 lb

## 2018-05-19 DIAGNOSIS — Z0000 Encounter for general adult medical examination without abnormal findings: Secondary | ICD-10-CM | POA: Diagnosis not present

## 2018-05-19 DIAGNOSIS — Z780 Asymptomatic menopausal state: Secondary | ICD-10-CM | POA: Diagnosis not present

## 2018-05-19 NOTE — Patient Instructions (Signed)
Sarah [name] , Thank you for taking time to come for your Medicare Wellness Visit. I appreciate your ongoing commitment to your health goals. Please review the following plan we discussed and let me know if I can assist you in the future.   Screening recommendations/referrals: Colonoscopy: completed 12/04/2014 Mammogram: completed 07/14/2017 Bone Density: due now Please call [phone] to schedule. Recommended yearly ophthalmology/optometry visit for glaucoma screening and checkup Recommended yearly dental visit for hygiene and checkup  Vaccinations: Influenza vaccine: due 07/2018 Pneumococcal vaccine: pneumovax 23 due 06/23/2018 Tdap vaccine: up to date Shingles vaccine: shingrix eligible, check with your insurance company for coverage     Advanced directives: Advance directive discussed with you today. I have provided a copy for you to complete at home and have notarized. Once this is complete please bring a copy in to our office so we can scan it into your chart.  Conditions/risks identified: smoking cessation discussed  Next appointment: Follow up on 07/15/2018 at 1:00pm with Regino Schultze. Follow up in one year for your annual wellness exam.    Preventive Care 65 Years and Older, Female Preventive care refers to lifestyle choices and visits with your health care provider that can promote health and wellness. What does preventive care include?  A yearly physical exam. This is also called an annual well check.  Dental exams once or twice a year.  Routine eye exams. Ask your health care provider how often you should have your eyes checked.  Personal lifestyle choices, including:  Daily care of your teeth and gums.  Regular physical activity.  Eating a healthy diet.  Avoiding tobacco and drug use.  Limiting alcohol use.  Practicing safe sex.  Taking low-dose aspirin every day.  Taking vitamin and mineral supplements as recommended by your health care provider. What  happens during an annual well check? The services and screenings done by your health care provider during your annual well check will depend on your age, overall health, lifestyle risk factors, and family history of disease. Counseling  Your health care provider may ask you questions about your:  Alcohol use.  Tobacco use.  Drug use.  Emotional well-being.  Home and relationship well-being.  Sexual activity.  Eating habits.  History of falls.  Memory and ability to understand (cognition).  Work and work Statistician.  Reproductive health. Screening  You may have the following tests or measurements:  Height, weight, and BMI.  Blood pressure.  Lipid and cholesterol levels. These may be checked every 5 years, or more frequently if you are over 21 years old.  Skin check.  Lung cancer screening. You may have this screening every year starting at age 88 if you have a 30-pack-year history of smoking and currently smoke or have quit within the past 15 years.  Fecal occult blood test (FOBT) of the stool. You may have this test every year starting at age 85.  Flexible sigmoidoscopy or colonoscopy. You may have a sigmoidoscopy every 5 years or a colonoscopy every 10 years starting at age 10.  Hepatitis C blood test.  Hepatitis B blood test.  Sexually transmitted disease (STD) testing.  Diabetes screening. This is done by checking your blood sugar (glucose) after you have not eaten for a while (fasting). You may have this done every 1-3 years.  Bone density scan. This is done to screen for osteoporosis. You may have this done starting at age 4.  Mammogram. This may be done every 1-2 years. Talk to your health care provider  about how often you should have regular mammograms. Talk with your health care provider about your test results, treatment options, and if necessary, the need for more tests. Vaccines  Your health care provider may recommend certain vaccines, such  as:  Influenza vaccine. This is recommended every year.  Tetanus, diphtheria, and acellular pertussis (Tdap, Td) vaccine. You may need a Td booster every 10 years.  Zoster vaccine. You may need this after age 69.  Pneumococcal 13-valent conjugate (PCV13) vaccine. One dose is recommended after age 55.  Pneumococcal polysaccharide (PPSV23) vaccine. One dose is recommended after age 55. Talk to your health care provider about which screenings and vaccines you need and how often you need them. This information is not intended to replace advice given to you by your health care provider. Make sure you discuss any questions you have with your health care provider. Document Released: 12/13/2015 Document Revised: 08/05/2016 Document Reviewed: 09/17/2015 Elsevier Interactive Patient Education  2017 [place] Prevention in the Home Falls can cause injuries. They can happen to people of all ages. There are many things you can do to make your home safe and to help prevent falls. What can I do on the outside of my home?  Regularly fix the edges of walkways and driveways and fix any cracks.  Remove anything that might make you trip as you walk through a door, such as a raised step or threshold.  Trim any bushes or trees on the path to your home.  Use bright outdoor lighting.  Clear any walking paths of anything that might make someone trip, such as rocks or tools.  Regularly check to see if handrails are loose or broken. Make sure that both sides of any steps have handrails.  Any raised decks and porches should have guardrails on the edges.  Have any leaves, snow, or ice cleared regularly.  Use sand or salt on walking paths during winter.  Clean up any spills in your garage right away. This includes oil or grease spills. What can I do in the bathroom?  Use night lights.  Install grab bars by the toilet and in the tub and shower. Do not use towel bars as grab bars.  Use  non-skid mats or decals in the tub or shower.  If you need to sit down in the shower, use a plastic, non-slip stool.  Keep the floor dry. Clean up any water that spills on the floor as soon as it happens.  Remove soap buildup in the tub or shower regularly.  Attach bath mats securely with double-sided non-slip rug tape.  Do not have throw rugs and other things on the floor that can make you trip. What can I do in the bedroom?  Use night lights.  Make sure that you have a light by your bed that is easy to reach.  Do not use any sheets or blankets that are too big for your bed. They should not hang down onto the floor.  Have a firm chair that has side arms. You can use this for support while you get dressed.  Do not have throw rugs and other things on the floor that can make you trip. What can I do in the kitchen?  Clean up any spills right away.  Avoid walking on wet floors.  Keep items that you use a lot in easy-to-reach places.  If you need to reach something above you, use a strong step stool that has a grab bar.  Keep electrical cords out of the way.  Do not use floor polish or wax that makes floors slippery. If you must use wax, use non-skid floor wax.  Do not have throw rugs and other things on the floor that can make you trip. What can I do with my stairs?  Do not leave any items on the stairs.  Make sure that there are handrails on both sides of the stairs and use them. Fix handrails that are broken or loose. Make sure that handrails are as long as the stairways.  Check any carpeting to make sure that it is firmly attached to the stairs. Fix any carpet that is loose or worn.  Avoid having throw rugs at the top or bottom of the stairs. If you do have throw rugs, attach them to the floor with carpet tape.  Make sure that you have a light switch at the top of the stairs and the bottom of the stairs. If you do not have them, ask someone to add them for you. What  else can I do to help prevent falls?  Wear shoes that:  Do not have high heels.  Have rubber bottoms.  Are comfortable and fit you well.  Are closed at the toe. Do not wear sandals.  If you use a stepladder:  Make sure that it is fully opened. Do not climb a closed stepladder.  Make sure that both sides of the stepladder are locked into place.  Ask someone to hold it for you, if possible.  Clearly mark and make sure that you can see:  Any grab bars or handrails.  First and last steps.  Where the edge of each step is.  Use tools that help you move around (mobility aids) if they are needed. These include:  Canes.  Walkers.  Scooters.  Crutches.  Turn on the lights when you go into a dark area. Replace any light bulbs as soon as they burn out.  Set up your furniture so you have a clear path. Avoid moving your furniture around.  If any of your floors are uneven, fix them.  If there are any pets around you, be aware of where they are.  Review your medicines with your doctor. Some medicines can make you feel dizzy. This can increase your chance of falling. Ask your doctor what other things that you can do to help prevent falls. This information is not intended to replace advice given to you by your health care provider. Make sure you discuss any questions you have with your health care provider. Document Released: 09/12/2009 Document Revised: 04/23/2016 Document Reviewed: 12/21/2014 Elsevier Interactive Patient Education  2017 [employer].   Steps to Quit Smoking Smoking tobacco can be bad for your health. It can also affect almost every organ in your body. Smoking puts you and people around you at risk for many serious long-lasting (chronic) diseases. Quitting smoking is hard, but it is one of the best things that you can do for your health. It is never too late to quit. What are the benefits of quitting smoking? When you quit smoking, you lower your risk for  getting serious diseases and conditions. They can include:  Lung cancer or lung disease.  Heart disease.  Stroke.  Heart attack.  Not being able to have children (infertility).  Weak bones (osteoporosis) and broken bones (fractures).  If you have coughing, wheezing, and shortness of breath, those symptoms may get better when you quit. You may also get  sick less often. If you are pregnant, quitting smoking can help to lower your chances of having a baby of low birth weight. What can I do to help me quit smoking? Talk with your doctor about what can help you quit smoking. Some things you can do (strategies) include:  Quitting smoking totally, instead of slowly cutting back how much you smoke over a period of time.  Going to in-person counseling. You are more likely to quit if you go to many counseling sessions.  Using resources and support systems, such as: ? Database administrator with a Social worker. ? Phone quitlines. ? Careers information officer. ? Support groups or group counseling. ? Text messaging programs. ? Mobile phone apps or applications.  Taking medicines. Some of these medicines may have nicotine in them. If you are pregnant or breastfeeding, do not take any medicines to quit smoking unless your doctor says it is okay. Talk with your doctor about counseling or other things that can help you.  Talk with your doctor about using more than one strategy at the same time, such as taking medicines while you are also going to in-person counseling. This can help make quitting easier. What things can I do to make it easier to quit? Quitting smoking might feel very hard at first, but there is a lot that you can do to make it easier. Take these steps:  Talk to your family and friends. Ask them to support and encourage you.  Call phone quitlines, reach out to support groups, or work with a Social worker.  Ask people who smoke to not smoke around you.  Avoid places that make you want  (trigger) to smoke, such as: ? Bars. ? Parties. ? Smoke-break areas at work.  Spend time with people who do not smoke.  Lower the stress in your life. Stress can make you want to smoke. Try these things to help your stress: ? Getting regular exercise. ? Deep-breathing exercises. ? Yoga. ? Meditating. ? Doing a body scan. To do this, close your eyes, focus on one area of your body at a time from head to toe, and notice which parts of your body are tense. Try to relax the muscles in those areas.  Download or buy apps on your mobile phone or tablet that can help you stick to your quit plan. There are many free apps, such as QuitGuide from the [employer] Office manager for Disease Control and Prevention). You can find more support from smokefree.gov and other websites.  This information is not intended to replace advice given to you by your health care provider. Make sure you discuss any questions you have with your health care provider. Document Released: 09/12/2009 Document Revised: 07/14/2016 Document Reviewed: 04/02/2015 Elsevier Interactive Patient Education  2018 [employer].

## 2018-05-19 NOTE — Progress Notes (Signed)
Subjective:   Sarah [name] is a 67 y.o. female who presents for an Initial Medicare Annual Wellness Visit.  Review of Systems      Cardiac Risk Factors include: advanced age (>20men, >81 women);hypertension;dyslipidemia;smoking/ tobacco exposure;obesity (BMI >30kg/m2)     Objective:    Today's Vitals   05/19/18 1102 05/19/18 1109  BP: 123/72   Pulse: 68   Resp: 16   Temp: 97.9 F (36.6 C)   TempSrc: Temporal   SpO2: 95%   Weight: 219 lb 6.4 oz (99.5 kg)   Height: 5' 3.5" (1.613 m)   PainSc:  6    Body mass index is 38.26 kg/m.  Advanced Directives 05/19/2018 10/25/2017 06/22/2017 01/27/2017 01/01/2017 12/21/2016 11/09/2016  Does Patient Have a Medical Advance Directive? No No No No No No No  Would patient like information on creating a medical advance directive? Yes (MAU/Ambulatory/Procedural Areas - Information given) - Yes (ED - Information included in AVS) No - Patient declined No - Patient declined No - Patient declined -    Current Medications (verified) Outpatient Encounter Medications as of 05/19/2018  Medication Sig  . acetaminophen (TYLENOL) 325 MG tablet Take 650 mg by mouth every 6 (six) hours as needed for mild pain, moderate pain or headache.   Marland Kitchen amLODipine (NORVASC) 5 MG tablet TAKE 1 TABLET BY MOUTH EVERY DAY  . atenolol (TENORMIN) 100 MG tablet TAKE 1 TABLET BY MOUTH EVERY DAY  . atorvastatin (LIPITOR) 40 MG tablet Take 1 tablet (40 mg total) by mouth daily.  . benazepril-hydrochlorthiazide (LOTENSIN HCT) 20-25 MG tablet TAKE 1 TABLET BY MOUTH DAILY.  Marland Kitchen Cholecalciferol (VITAMIN D-3 PO) Take 600 Units by mouth daily.  . citalopram (CELEXA) 20 MG tablet TAKE 1 TABLET BY MOUTH EVERYDAY AT BEDTIME  . Multiple Vitamin (MULTIVITAMIN) tablet Take 1 tablet by mouth daily.  . traMADol (ULTRAM) 50 MG tablet Take 1 tablet (50 mg total) by mouth every 12 (twelve) hours as needed.  . benazepril-hydrochlorthiazide (LOTENSIN HCT) 20-25 MG tablet Take 1 tablet by mouth  daily. (Patient not taking: Reported on 05/19/2018)   No facility-administered encounter medications on file as of 05/19/2018.     Allergies (verified) Codeine   History: Past Medical History:  Diagnosis Date  . Abnormal mammogram, unspecified   . Aneurysm ([place]) 2004   cerebral x  4, [facility]  . Anxiety   . Arthritis   . Benign neoplasm of breast 2012  . Breast complaint 2010  . Chronic kidney disease    Renal Cell Cancer  . Hypertension   . Obesity, unspecified   . Personal history of tobacco use, presenting hazards to health   . Renal cancer, left ([place]) 11/2016   Renal Cell with Left nephrectomy.  Marland Kitchen Special screening for malignant neoplasms, colon    Past Surgical History:  Procedure Laterality Date  . ABDOMINAL HYSTERECTOMY  1993  . BREAST BIOPSY Right 2012   stereo bx showing a 69mm radial scar in an estimated 8 volume of tissue  . BREAST BIOPSY Right Apr 09, 2005   Fibrocystic changes, ductal adenosis, microcalcifications, focal stromal/epithelial proliferation, possible fibroadenoma type proliferation  . CEREBRAL ANEURYSM REPAIR  2004  . COLONOSCOPY  2008   Dr. Nicolasa Ducking  . EYE SURGERY    . FOOT SURGERY Right 2003  . LAPAROSCOPIC NEPHRECTOMY, HAND ASSISTED Left 01/01/2017   Procedure: HAND ASSISTED LAPAROSCOPIC NEPHRECTOMY;  Surgeon: Nickie Retort, MD;  Location: [facility] ORS;  Service: Urology;  Laterality: Left;  .  TONSILLECTOMY  1997   Family History  Problem Relation Age of Onset  . Cancer Cousin        maternal first cousin with breast cancer  . Cancer Other        colon and ovarian cancers, no relationship listed  . Cancer Father        prostate  . Hypertension Father   . Hypertension Sister   . Hypertension Brother   . Hypertension Paternal Grandmother   . Heart disease Paternal Grandmother   . Hypertension Paternal Grandfather   . Heart disease Paternal Grandfather    Social History   Socioeconomic History  . Marital status: Divorced     Spouse name: Not on file  . Number of children: Not on file  . Years of education: Not on file  . Highest education level: Not on file  Occupational History  . Not on file  Social Needs  . Financial resource strain: Not hard at all  . Food insecurity:    Worry: Never true    Inability: Never true  . Transportation needs:    Medical: No    Non-medical: No  Tobacco Use  . Smoking status: Current Every Day Smoker    Packs/day: 0.25    Years: 40.00    Pack years: 10.00    Types: Cigarettes  . Smokeless tobacco: Never Used  Substance and Sexual Activity  . Alcohol use: Yes    Comment: socially  . Drug use: No  . Sexual activity: Yes  Lifestyle  . Physical activity:    Days per week: 0 days    Minutes per session: 0 min  . Stress: Not at all  Relationships  . Social connections:    Talks on phone: More than three times a week    Gets together: More than three times a week    Attends religious service: More than 4 times per year    Active member of club or organization: No    Attends meetings of clubs or organizations: Never    Relationship status: Divorced  Other Topics Concern  . Not on file  Social History Narrative  . Not on file    Tobacco Counseling Ready to quit: No Counseling given: Yes   Clinical Intake:  Pre-visit preparation completed: Yes  Pain : No/denies pain Pain Score: 6  Pain Location: Knee     Nutritional Status: BMI > 30  Obese Nutritional Risks: None Diabetes: No  How often do you need to have someone help you when you read instructions, pamphlets, or other written materials from your doctor or pharmacy?: 1 - Never What is the last grade level you completed in school?: GED  Interpreter Needed?: No  Information entered by :: Ayyan Sites,LPN    Activities of Daily Living In your present state of health, do you have any difficulty performing the following activities: 05/19/2018 06/22/2017  Hearing? N N  Vision? N N  Difficulty  concentrating or making decisions? Y Y  Comment - remembering sometimes  Walking or climbing stairs? N N  Comment - knee pain sometimes   Dressing or bathing? N N  Doing errands, shopping? N N  Preparing Food and eating ? N -  Using the Toilet? N -  In the past six months, have you accidently leaked urine? N -  Do you have problems with loss of bowel control? N -  Managing your Medications? N -  Managing your Finances? N -  Housekeeping or managing your  Housekeeping? N -  Some recent data might be hidden     Immunizations and Health Maintenance Immunization History  Administered Date(s) Administered  . Influenza, High Dose Seasonal PF 08/30/2017  . Influenza-Unspecified 09/04/2015, 09/18/2016, 08/30/2017  . Pneumococcal Conjugate-13 06/22/2017  . Td 03/31/2006  . Tdap 06/09/2016  . Zoster 01/24/2013   Health Maintenance Due  Topic Date Due  . DEXA SCAN  05/05/2017    Patient Care Team: Kathrine Haddock, NP as PCP - General (Nurse Practitioner) Nickie Retort, MD as Consulting Physician (Urology) Lloyd Huger, MD as Consulting Physician (Oncology)  Indicate any recent Medical Services you may have received from other than [facility] providers in the past year (date may be approximate).     Assessment:   This is a routine wellness examination for [place].  Hearing/Vision screen Vision Screening Comments: Sees Dr.Bell annually   Dietary issues and exercise activities discussed: Current Exercise Habits: The patient does not participate in regular exercise at present, Exercise limited by: None identified  Goals    . Quit Smoking     Smoking cessation disscussed      Depression Screen PHQ 2/9 Scores 05/19/2018 01/07/2018 06/22/2017 12/15/2016 06/09/2016  PHQ - 2 Score 0 0 0 2 0  PHQ- 9 Score - 0 3 - -    Fall Risk Fall Risk  05/19/2018 06/22/2017 06/09/2016  Falls in the past year? Yes No No  Number falls in past yr: 1 - -  Injury with Fall? No - -    Is the  patient's home free of loose throw rugs in walkways, pet beds, electrical cords, etc?   yes      Grab bars in the bathroom? no      Handrails on the stairs?   no      Adequate lighting?   yes  Timed Get Up and Go Performed Completed in 8 seconds with no use of assistive devices, steady gait. No intervention needed at this time.   Cognitive Function:     6CIT Screen 05/19/2018  What Year? 0 points  What month? 0 points  What time? 0 points  Count back from 20 0 points  Months in reverse 0 points  Repeat phrase 0 points  Total Score 0    Screening Tests Health Maintenance  Topic Date Due  . DEXA SCAN  05/05/2017  . PNA vac Low Risk Adult (2 of 2 - PPSV23) 06/22/2018  . INFLUENZA VACCINE  06/30/2018  . MAMMOGRAM  07/15/2019  . COLONOSCOPY  12/04/2024  . TETANUS/TDAP  06/09/2026  . Hepatitis C Screening  Completed    Qualifies for Shingles Vaccine? Yes, discussed shingrix vaccine   Cancer Screenings: Lung: Low Dose CT Chest recommended if Age 33-80 years, 30 pack-year currently smoking OR have quit w/in 15years. Patient does not qualify. Breast: Up to date on Mammogram? Yes  07/14/2017 Up to date of Bone Density/Dexa? No Colorectal: completed 12/04/2014  Additional Screenings:  Hepatitis C Screening: completed 06/09/2016     Plan:    I have personally reviewed and addressed the Medicare Annual Wellness questionnaire and have noted the following in the patient's chart:  A. Medical and social history B. Use of alcohol, tobacco or illicit drugs  C. Current medications and supplements D. Functional ability and status E.  Nutritional status F.  Physical activity G. Advance directives H. List of other physicians I.  Hospitalizations, surgeries, and ER visits in previous 12 months J.  Vitals K. Screenings such as hearing  and vision if needed, cognitive and depression L. Referrals and appointments   In addition, I have reviewed and discussed with patient certain preventive  protocols, quality metrics, and best practice recommendations. A written personalized care plan for preventive services as well as general preventive health recommendations were provided to patient.   Signed,  Tyler Aas, LPN Nurse Health Advisor   Nurse Notes:none

## 2018-06-27 ENCOUNTER — Encounter: Payer: Self-pay | Admitting: Unknown Physician Specialty

## 2018-06-27 ENCOUNTER — Ambulatory Visit: Payer: 59 | Admitting: Unknown Physician Specialty

## 2018-06-27 VITALS — BP 125/77 | HR 67 | Temp 98.4°F | Ht 66.0 in | Wt 219.2 lb

## 2018-06-27 DIAGNOSIS — M170 Bilateral primary osteoarthritis of knee: Secondary | ICD-10-CM | POA: Diagnosis not present

## 2018-06-27 DIAGNOSIS — N183 Chronic kidney disease, stage 3 unspecified: Secondary | ICD-10-CM

## 2018-06-27 DIAGNOSIS — F324 Major depressive disorder, single episode, in partial remission: Secondary | ICD-10-CM | POA: Diagnosis not present

## 2018-06-27 DIAGNOSIS — E7800 Pure hypercholesterolemia, unspecified: Secondary | ICD-10-CM

## 2018-06-27 DIAGNOSIS — Z1231 Encounter for screening mammogram for malignant neoplasm of breast: Secondary | ICD-10-CM

## 2018-06-27 DIAGNOSIS — R718 Other abnormality of red blood cells: Secondary | ICD-10-CM | POA: Diagnosis not present

## 2018-06-27 DIAGNOSIS — F17200 Nicotine dependence, unspecified, uncomplicated: Secondary | ICD-10-CM

## 2018-06-27 DIAGNOSIS — I129 Hypertensive chronic kidney disease with stage 1 through stage 4 chronic kidney disease, or unspecified chronic kidney disease: Secondary | ICD-10-CM

## 2018-06-27 DIAGNOSIS — Z7189 Other specified counseling: Secondary | ICD-10-CM

## 2018-06-27 DIAGNOSIS — Z1239 Encounter for other screening for malignant neoplasm of breast: Secondary | ICD-10-CM

## 2018-06-27 MED ORDER — ATENOLOL 100 MG PO TABS: 100.0000 mg | ORAL_TABLET | Freq: Every day | ORAL | 1 refills | Status: DC

## 2018-06-27 MED ORDER — CITALOPRAM HYDROBROMIDE 20 MG PO TABS: 20.0000 mg | ORAL_TABLET | Freq: Every day | ORAL | 1 refills | Status: DC

## 2018-06-27 MED ORDER — TRAMADOL HCL 50 MG PO TABS: 50.0000 mg | ORAL_TABLET | Freq: Two times a day (BID) | ORAL | 1 refills | Status: DC | PRN

## 2018-06-27 MED ORDER — ATORVASTATIN CALCIUM 40 MG PO TABS: 40.0000 mg | ORAL_TABLET | Freq: Every day | ORAL | 3 refills | Status: DC

## 2018-06-27 MED ORDER — BENAZEPRIL-HYDROCHLOROTHIAZIDE 20-25 MG PO TABS: 1.0000 | ORAL_TABLET | Freq: Every day | ORAL | 0 refills | Status: DC

## 2018-06-27 MED ORDER — AMLODIPINE BESYLATE 5 MG PO TABS: 5.0000 mg | ORAL_TABLET | Freq: Every day | ORAL | 1 refills | Status: DC

## 2018-06-27 NOTE — Assessment & Plan Note (Signed)
Stable, continue present medications.   

## 2018-06-27 NOTE — Assessment & Plan Note (Signed)
Takes very occasional Tramadol.  Will fill today

## 2018-06-27 NOTE — Assessment & Plan Note (Signed)
A voluntary discussion about advance care planning including the explanation and discussion of advance directives was extensively discussed  with the patient.  Forms are in the car.  Explanation about the health care proxy and Living will was reviewed and packet with forms with explanation of how to fill them out was given.  During this discussion, the patient was able to identify her health care proxy as her daughter and to sister as secondary.  She might plan to fill out the paperwork required.  Patient was offered a separate [place] visit for further assistance with forms.  Time spent:  12 minutes      Individuals present patient only

## 2018-06-27 NOTE — Progress Notes (Signed)
BP 125/77   Pulse 67   Temp 98.4 F (36.9 C) (Oral)   Ht 5\' 6"  (1.676 m)   Wt 219 lb 3.2 oz (99.4 kg)   SpO2 97%   BMI 35.38 kg/m    Subjective:    Patient ID: Sarah [name], female    DOB: 1951/10/21, 67 y.o.   MRN: [id number]  HPI: Sarah [name] is a 67 y.o. female  Chief Complaint  Patient presents with  . Depression  . Hyperlipidemia  . Hypertension   Hypertension Using medications without difficulty Average home BPs   No problems or lightheadedness No chest pain with exertion or shortness of breath No Edema   Hyperlipidemia Using medications without problems: No Muscle aches  Diet compliance: Exercise:   Depression: Doing well with present medications.   Depression screen [facility] 2/9 06/27/2018 05/19/2018 01/07/2018 06/22/2017 12/15/2016  Decreased Interest 0 0 0 0 1  Down, Depressed, Hopeless 0 0 0 0 1  PHQ - 2 Score 0 0 0 0 2  Altered sleeping 0 - 0 1 -  Tired, decreased energy 1 - 0 1 -  Change in appetite 1 - 0 1 -  Feeling bad or failure about yourself  0 - 0 0 -  Trouble concentrating 0 - 0 0 -  Moving slowly or fidgety/restless 0 - 0 0 -  Suicidal thoughts 0 - 0 0 -  PHQ-9 Score 2 - 0 3 -  Difficult doing work/chores Not difficult at all - - - -   Knee OA Pt taking Tramadol on occasion.  Would lie another refill.    Social History   Socioeconomic History  . Marital status: Divorced    Spouse name: Not on file  . Number of children: Not on file  . Years of education: Not on file  . Highest education level: Not on file  Occupational History  . Not on file  Social Needs  . Financial resource strain: Not hard at all  . Food insecurity:    Worry: Never true    Inability: Never true  . Transportation needs:    Medical: No    Non-medical: No  Tobacco Use  . Smoking status: Current Every Day Smoker    Packs/day: 0.25    Years: 40.00    Pack years: 10.00    Types: Cigarettes  . Smokeless tobacco: Never Used  Substance and Sexual  Activity  . Alcohol use: Yes    Comment: socially  . Drug use: No  . Sexual activity: Yes  Lifestyle  . Physical activity:    Days per week: 0 days    Minutes per session: 0 min  . Stress: Not at all  Relationships  . Social connections:    Talks on phone: More than three times a week    Gets together: More than three times a week    Attends religious service: More than 4 times per year    Active member of club or organization: No    Attends meetings of clubs or organizations: Never    Relationship status: Divorced  . Intimate partner violence:    Fear of current or ex partner: No    Emotionally abused: No    Physically abused: No    Forced sexual activity: No  Other Topics Concern  . Not on file  Social History Narrative  . Not on file     Relevant past medical, surgical, family and social history reviewed and updated as indicated.  Interim medical history since our last visit reviewed. Allergies and medications reviewed and updated.  Review of Systems  Per HPI unless specifically indicated above     Objective:    BP 125/77   Pulse 67   Temp 98.4 F (36.9 C) (Oral)   Ht 5\' 6"  (1.676 m)   Wt 219 lb 3.2 oz (99.4 kg)   SpO2 97%   BMI 35.38 kg/m   Wt Readings from Last 3 Encounters:  06/27/18 219 lb 3.2 oz (99.4 kg)  05/19/18 219 lb 6.4 oz (99.5 kg)  05/10/18 217 lb 12.8 oz (98.8 kg)    Physical Exam  Constitutional: She is oriented to person, place, and time. She appears well-developed and well-nourished.  HENT:  Head: Normocephalic and atraumatic.  Eyes: Pupils are equal, round, and reactive to light. Right eye exhibits no discharge. Left eye exhibits no discharge. No scleral icterus.  Neck: Normal range of motion. Neck supple. Carotid bruit is not present. No thyromegaly present.  Cardiovascular: Normal rate, regular rhythm and normal heart sounds. Exam reveals no gallop and no friction rub.  No murmur heard. Pulmonary/Chest: Effort normal and breath  sounds normal. No respiratory distress. She has no wheezes. She has no rales. No breast tenderness or discharge.  Abdominal: Soft. Bowel sounds are normal. There is no tenderness. There is no rebound.  Genitourinary: No breast tenderness or discharge.  Musculoskeletal: Normal range of motion.  Lymphadenopathy:    She has no cervical adenopathy.  Neurological: She is alert and oriented to person, place, and time.  Skin: Skin is warm, dry and intact. No rash noted.  Psychiatric: She has a normal mood and affect. Her speech is normal and behavior is normal. Judgment and thought content normal. Cognition and memory are normal.    Results for orders placed or performed in visit on 04/29/18  Comprehensive metabolic panel  Result Value Ref Range   Sodium 139 135 - 145 mmol/L   Potassium 4.0 3.5 - 5.1 mmol/L   Chloride 105 101 - 111 mmol/L   CO2 26 22 - 32 mmol/L   Glucose, Bld 116 (H) 65 - 99 mg/dL   BUN 30 (H) 6 - 20 mg/dL   Creatinine, Ser 1.49 (H) 0.44 - 1.00 mg/dL   Calcium 9.5 8.9 - 10.3 mg/dL   Total Protein 7.6 6.5 - 8.1 g/dL   Albumin 3.9 3.5 - 5.0 g/dL   AST 18 15 - 41 U/L   ALT 12 (L) 14 - 54 U/L   Alkaline Phosphatase 74 38 - 126 U/L   Total Bilirubin 0.4 0.3 - 1.2 mg/dL   GFR calc non Af Amer 36 (L) >60 mL/min   GFR calc Af Amer 41 (L) >60 mL/min   Anion gap 8 5 - 15  CBC with Differential/Platelet  Result Value Ref Range   WBC 7.3 3.6 - 11.0 K/uL   RBC 3.57 (L) 3.80 - 5.20 MIL/uL   Hemoglobin 10.9 (L) 12.0 - 16.0 g/dL   HCT 31.6 (L) 35.0 - 47.0 %   MCV 88.5 80.0 - 100.0 fL   MCH 30.5 26.0 - 34.0 pg   MCHC 34.5 32.0 - 36.0 g/dL   RDW 14.5 11.5 - 14.5 %   Platelets 278 150 - 440 K/uL   Neutrophils Relative % 56 %   Neutro Abs 4.1 1.4 - 6.5 K/uL   Lymphocytes Relative 33 %   Lymphs Abs 2.4 1.0 - 3.6 K/uL   Monocytes Relative 8 %   Monocytes  Absolute 0.6 0.2 - 0.9 K/uL   Eosinophils Relative 2 %   Eosinophils Absolute 0.1 0 - 0.7 K/uL   Basophils Relative 1 %    Basophils Absolute 0.0 0 - 0.1 K/uL      Assessment & Plan:   Problem List Items Addressed This Visit      Unprioritized   Advance care planning    A voluntary discussion about advance care planning including the explanation and discussion of advance directives was extensively discussed  with the patient.  Forms are in the car.  Explanation about the health care proxy and Living will was reviewed and packet with forms with explanation of how to fill them out was given.  During this discussion, the patient was able to identify her health care proxy as her daughter and to sister as secondary.  She might plan to fill out the paperwork required.  Patient was offered a separate [place] visit for further assistance with forms.  Time spent:  12 minutes      Individuals present patient only       Benign hypertension with chronic kidney disease    Stable, continue present medications.  Check GFR      Relevant Medications   atorvastatin (LIPITOR) 40 MG tablet   benazepril-hydrochlorthiazide (LOTENSIN HCT) 20-25 MG tablet   amLODipine (NORVASC) 5 MG tablet   atenolol (TENORMIN) 100 MG tablet   Other Relevant Orders   CBC with Differential/Platelet   Chronic kidney disease, stage 3 (HCC) - Primary   Relevant Orders   Comprehensive metabolic panel   Depression    Stable, continue present medications.        Relevant Medications   citalopram (CELEXA) 20 MG tablet   Hyperlipidemia   Relevant Medications   atorvastatin (LIPITOR) 40 MG tablet   benazepril-hydrochlorthiazide (LOTENSIN HCT) 20-25 MG tablet   amLODipine (NORVASC) 5 MG tablet   atenolol (TENORMIN) 100 MG tablet   Other Relevant Orders   Lipid Panel w/o Chol/HDL Ratio   Knee osteoarthritis    Takes very occasional Tramadol.  Will fill today      Relevant Medications   traMADol (ULTRAM) 50 MG tablet   Tobacco dependence     I have recommended absolute tobacco cessation. I have discussed various options  available for assistance with tobacco cessation including over the counter methods (Nicotine gum, patch and lozenges). We also discussed prescription options (Chantix, Nicotine Inhaler / Nasal Spray). The patient is not interested in pursuing any prescription tobacco cessation options at this time. Time of education 3 minutes        Other Visit Diagnoses    Breast cancer screening       Relevant Orders   MM DIGITAL SCREENING BILATERAL       Follow up plan: Return in about 6 months (around 12/28/2018).

## 2018-06-27 NOTE — Assessment & Plan Note (Signed)
Stable, continue present medications.  Check GFR

## 2018-06-27 NOTE — Assessment & Plan Note (Addendum)
I have recommended absolute tobacco cessation. I have discussed various options available for assistance with tobacco cessation including over the counter methods (Nicotine gum, patch and lozenges). We also discussed prescription options (Chantix, Nicotine Inhaler / Nasal Spray). The patient is not interested in pursuing any prescription tobacco cessation options at this time. Time of education 3 minutes

## 2018-06-28 LAB — LIPID PANEL W/O CHOL/HDL RATIO
Cholesterol, Total: 110 mg/dL (ref 100–199)
HDL: 45 mg/dL (ref >39)
LDL Calculated: 53 mg/dL (ref 0–99)
Triglycerides: 60 mg/dL (ref 0–149)
VLDL Cholesterol Cal: 12 mg/dL (ref 5–40)

## 2018-06-28 LAB — CBC WITH DIFFERENTIAL/PLATELET
Basophils Absolute: 0 10*3/uL (ref 0.0–0.2)
Basos: 0 %
EOS (ABSOLUTE): 0.1 10*3/uL (ref 0.0–0.4)
Eos: 2 %
Hematocrit: 32.1 % — ABNORMAL LOW (ref 34.0–46.6)
Hemoglobin: 10.8 g/dL — ABNORMAL LOW (ref 11.1–15.9)
IMMATURE GRANULOCYTES: 0 %
Immature Grans (Abs): 0 10*3/uL (ref 0.0–0.1)
LYMPHS: 39 %
Lymphocytes Absolute: 2.5 10*3/uL (ref 0.7–3.1)
MCH: 29.8 pg (ref 26.6–33.0)
MCHC: 33.6 g/dL (ref 31.5–35.7)
MCV: 88 fL (ref 79–97)
Monocytes Absolute: 0.5 10*3/uL (ref 0.1–0.9)
Monocytes: 8 %
NEUTROS PCT: 51 %
Neutrophils Absolute: 3.3 10*3/uL (ref 1.4–7.0)
Platelets: 300 10*3/uL (ref 150–450)
RBC: 3.63 x10E6/uL — AB (ref 3.77–5.28)
RDW: 15.5 % — AB (ref 12.3–15.4)
WBC: 6.5 10*3/uL (ref 3.4–10.8)

## 2018-06-28 LAB — COMPREHENSIVE METABOLIC PANEL
ALT: 11 IU/L (ref 0–32)
AST: 19 IU/L (ref 0–40)
Albumin/Globulin Ratio: 1.4 (ref 1.2–2.2)
Albumin: 3.9 g/dL (ref 3.6–4.8)
Alkaline Phosphatase: 88 IU/L (ref 39–117)
BUN/Creatinine Ratio: 15 (ref 12–28)
BUN: 24 mg/dL (ref 8–27)
Bilirubin Total: 0.2 mg/dL (ref 0.0–1.2)
CALCIUM: 9.8 mg/dL (ref 8.7–10.3)
CO2: 24 mmol/L (ref 20–29)
Chloride: 106 mmol/L (ref 96–106)
Creatinine, Ser: 1.56 mg/dL — ABNORMAL HIGH (ref 0.57–1.00)
GFR, EST AFRICAN AMERICAN: 40 mL/min/{1.73_m2} — AB (ref >59)
GFR, EST NON AFRICAN AMERICAN: 34 mL/min/{1.73_m2} — AB (ref >59)
Globulin, Total: 2.7 g/dL (ref 1.5–4.5)
Glucose: 72 mg/dL (ref 65–99)
Potassium: 4.4 mmol/L (ref 3.5–5.2)
Sodium: 144 mmol/L (ref 134–144)
TOTAL PROTEIN: 6.6 g/dL (ref 6.0–8.5)

## 2018-06-29 ENCOUNTER — Telehealth: Payer: Self-pay | Admitting: Unknown Physician Specialty

## 2018-06-29 LAB — ANEMIA PROFILE A
Basophils Absolute: 0 10*3/uL (ref 0.0–0.2)
Basos: 0 %
EOS (ABSOLUTE): 0.3 10*3/uL (ref 0.0–0.4)
Eos: 4 %
Hematocrit: 32.8 % — ABNORMAL LOW (ref 34.0–46.6)
Hemoglobin: 10.6 g/dL — ABNORMAL LOW (ref 11.1–15.9)
IMMATURE GRANS (ABS): 0 10*3/uL (ref 0.0–0.1)
IRON: 66 ug/dL (ref 27–139)
Immature Granulocytes: 0 %
Iron Saturation: 23 % (ref 15–55)
LYMPHS: 39 %
Lymphocytes Absolute: 2.7 10*3/uL (ref 0.7–3.1)
MCH: 29.2 pg (ref 26.6–33.0)
MCHC: 32.3 g/dL (ref 31.5–35.7)
MCV: 90 fL (ref 79–97)
MONOCYTES: 7 %
Monocytes Absolute: 0.5 10*3/uL (ref 0.1–0.9)
NEUTROS ABS: 3.4 10*3/uL (ref 1.4–7.0)
Neutrophils: 50 %
Platelets: 307 10*3/uL (ref 150–450)
RBC: 3.63 x10E6/uL — ABNORMAL LOW (ref 3.77–5.28)
RDW: 15.3 % (ref 12.3–15.4)
RETIC CT PCT: 1.1 % (ref 0.6–2.6)
TIBC: 289 ug/dL (ref 250–450)
UIBC: 223 ug/dL (ref 118–369)
WBC: 6.8 10*3/uL (ref 3.4–10.8)

## 2018-06-29 LAB — SPECIMEN STATUS REPORT

## 2018-06-29 NOTE — Telephone Encounter (Signed)
Discussed with pt about labs.  They are stable.  Microcytosis with a normal anemia panel.  Seeing Dr Candiss Norse for CKD.

## 2018-07-15 ENCOUNTER — Ambulatory Visit: Payer: Medicare Other | Admitting: Unknown Physician Specialty

## 2018-07-20 ENCOUNTER — Ambulatory Visit
Admission: RE | Admit: 2018-07-20 | Discharge: 2018-07-20 | Disposition: A | Discharge status: Home / Self Care | Payer: 59 | Source: Ambulatory Visit | Attending: Unknown Physician Specialty | Admitting: Unknown Physician Specialty

## 2018-07-20 DIAGNOSIS — Z1231 Encounter for screening mammogram for malignant neoplasm of breast: Secondary | ICD-10-CM | POA: Insufficient documentation

## 2018-07-20 DIAGNOSIS — Z1239 Encounter for other screening for malignant neoplasm of breast: Secondary | ICD-10-CM

## 2018-08-15 DIAGNOSIS — Z23 Encounter for immunization: Secondary | ICD-10-CM | POA: Diagnosis not present

## 2018-10-12 DIAGNOSIS — Z905 Acquired absence of kidney: Secondary | ICD-10-CM | POA: Diagnosis not present

## 2018-10-12 DIAGNOSIS — R600 Localized edema: Secondary | ICD-10-CM | POA: Diagnosis not present

## 2018-10-12 DIAGNOSIS — N183 Chronic kidney disease, stage 3 (moderate): Secondary | ICD-10-CM | POA: Diagnosis not present

## 2018-10-12 DIAGNOSIS — I129 Hypertensive chronic kidney disease with stage 1 through stage 4 chronic kidney disease, or unspecified chronic kidney disease: Secondary | ICD-10-CM | POA: Diagnosis not present

## 2018-10-19 ENCOUNTER — Other Ambulatory Visit: Payer: Self-pay

## 2018-11-04 ENCOUNTER — Ambulatory Visit
Admission: RE | Admit: 2018-11-04 | Discharge: 2018-11-04 | Disposition: A | Discharge status: Home / Self Care | Payer: Commercial Managed Care - HMO | Source: Ambulatory Visit | Attending: Oncology | Admitting: Oncology

## 2018-11-04 DIAGNOSIS — N200 Calculus of kidney: Secondary | ICD-10-CM | POA: Diagnosis not present

## 2018-11-04 DIAGNOSIS — C642 Malignant neoplasm of left kidney, except renal pelvis: Secondary | ICD-10-CM | POA: Diagnosis not present

## 2018-11-07 NOTE — Progress Notes (Signed)
[place]  [phone]) [phone] [phone]  ID: [name] OB: 03/19/1951  MR#: [id number]  YBW#:[id number]  Patient Care Team: Kathrine Haddock, NP as PCP - General (Nurse Practitioner) Nickie Retort, MD as Consulting Physician (Urology) Lloyd Huger, MD as Consulting Physician (Oncology)  CHIEF COMPLAINT: Stage III renal cell carcinoma, left.  INTERVAL HISTORY: Patient returns to clinic today for routine 99-month evaluation and discussion of her imaging results.  She currently feels well and is asymptomatic. She has no neurologic complaints. She denies any recent fevers or illnesses. She has a good appetite and denies weight loss. She has no chest pain or shortness of breath.  She has no abdominal pain.  She denies any nausea, vomiting, constipation, or diarrhea. She has no urinary complaints.  Patient feels at her baseline offers no specific complaints today.  REVIEW OF SYSTEMS:   Review of Systems  Constitutional: Negative.  Negative for fever, malaise/fatigue and weight loss.  Respiratory: Negative.  Negative for cough and shortness of breath.   Cardiovascular: Negative.  Negative for chest pain and leg swelling.  Gastrointestinal: Negative.  Negative for abdominal pain.  Genitourinary: Negative.  Negative for flank pain and hematuria.  Musculoskeletal: Negative.  Negative for back pain.  Skin: Negative.  Negative for rash.  Neurological: Negative.  Negative for sensory change, focal weakness and weakness.  Psychiatric/Behavioral: Negative.  The patient is not nervous/anxious.     As per HPI. Otherwise, a complete review of systems is negative.  PAST MEDICAL HISTORY: Past Medical History:  Diagnosis Date  . Abnormal mammogram, unspecified   . Aneurysm ([place]) 2004   cerebral x  4, [facility]  . Anxiety   . Arthritis   . Benign neoplasm of breast 2012  . Breast complaint 2010  . Chronic kidney disease    Renal Cell Cancer    . Hypertension   . Obesity, unspecified   . Personal history of tobacco use, presenting hazards to health   . Renal cancer, left ([place]) 11/2016   Renal Cell with Left nephrectomy.  Marland Kitchen Special screening for malignant neoplasms, colon     PAST SURGICAL HISTORY: Past Surgical History:  Procedure Laterality Date  . ABDOMINAL HYSTERECTOMY  1993  . BREAST BIOPSY Right 2012   stereo bx showing a 91mm radial scar in an estimated 8 volume of tissue  . BREAST BIOPSY Right Apr 09, 2005   Fibrocystic changes, ductal adenosis, microcalcifications, focal stromal/epithelial proliferation, possible fibroadenoma type proliferation  . CEREBRAL ANEURYSM REPAIR  2004  . COLONOSCOPY  2008   Dr. Nicolasa Ducking  . EYE SURGERY    . FOOT SURGERY Right 2003  . LAPAROSCOPIC NEPHRECTOMY, HAND ASSISTED Left 01/01/2017   Procedure: HAND ASSISTED LAPAROSCOPIC NEPHRECTOMY;  Surgeon: Nickie Retort, MD;  Location: [facility] ORS;  Service: Urology;  Laterality: Left;  . TONSILLECTOMY  1997    FAMILY HISTORY: Family History  Problem Relation Age of Onset  . Cancer Cousin        maternal first cousin with breast cancer  . Cancer Other        colon and ovarian cancers, no relationship listed  . Cancer Father        prostate  . Hypertension Father   . Hypertension Sister   . Hypertension Brother   . Hypertension Paternal Grandmother   . Heart disease Paternal Grandmother   . Hypertension Paternal Grandfather   . Heart disease Paternal Grandfather   . Breast cancer Neg Hx  ADVANCED DIRECTIVES (Y/N):  N  HEALTH MAINTENANCE: Social History   Tobacco Use  . Smoking status: Current Every Day Smoker    Packs/day: 0.25    Years: 40.00    Pack years: 10.00    Types: Cigarettes  . Smokeless tobacco: Never Used  Substance Use Topics  . Alcohol use: Yes    Comment: socially  . Drug use: No     Colonoscopy:  PAP:  Bone density:  Lipid panel:  Allergies  Allergen Reactions  . Codeine Itching    Current  Outpatient Medications  Medication Sig Dispense Refill  . acetaminophen (TYLENOL) 325 MG tablet Take 650 mg by mouth every 6 (six) hours as needed for mild pain, moderate pain or headache.     Marland Kitchen amLODipine (NORVASC) 5 MG tablet Take 1 tablet (5 mg total) by mouth daily. 90 tablet 1  . atenolol (TENORMIN) 100 MG tablet Take 1 tablet (100 mg total) by mouth daily. 90 tablet 1  . atorvastatin (LIPITOR) 40 MG tablet Take 1 tablet (40 mg total) by mouth daily. 90 tablet 3  . benazepril-hydrochlorthiazide (LOTENSIN HCT) 20-25 MG tablet Take 1 tablet by mouth daily. 90 tablet 0  . Cholecalciferol (VITAMIN D-3 PO) Take 600 Units by mouth daily.    . citalopram (CELEXA) 20 MG tablet Take 1 tablet (20 mg total) by mouth daily. 90 tablet 1  . Multiple Vitamin (MULTIVITAMIN) tablet Take 1 tablet by mouth daily.    . traMADol (ULTRAM) 50 MG tablet Take 1 tablet (50 mg total) by mouth every 12 (twelve) hours as needed. 30 tablet 1   No current facility-administered medications for this visit.     OBJECTIVE: Vitals:   11/10/18 1046  BP: (!) 152/79  Pulse: 71  Resp: 20  Temp: 97.8 F (36.6 C)     Body mass index is 34.38 kg/m.    ECOG FS:0 - Asymptomatic  General: Well-developed, well-nourished, no acute distress. Eyes: Pink conjunctiva, anicteric sclera. HEENT: Normocephalic, moist mucous membranes. Lungs: Clear to auscultation bilaterally. Heart: Regular rate and rhythm. No rubs, murmurs, or gallops. Abdomen: Soft, nontender, nondistended. No organomegaly noted, normoactive bowel sounds. Musculoskeletal: No edema, cyanosis, or clubbing. Neuro: Alert, answering all questions appropriately. Cranial nerves grossly intact. Skin: No rashes or petechiae noted. Psych: Normal affect.  LAB RESULTS:  Lab Results  Component Value Date   NA 140 11/10/2018   K 3.9 11/10/2018   CL 111 11/10/2018   CO2 24 11/10/2018   GLUCOSE 93 11/10/2018   BUN 33 (H) 11/10/2018   CREATININE 1.66 (H) 11/10/2018    CALCIUM 9.4 11/10/2018   PROT 7.1 11/10/2018   ALBUMIN 3.7 11/10/2018   AST 16 11/10/2018   ALT 12 11/10/2018   ALKPHOS 68 11/10/2018   BILITOT 0.5 11/10/2018   GFRNONAA 32 (L) 11/10/2018   GFRAA 37 (L) 11/10/2018    Lab Results  Component Value Date   WBC 7.5 11/10/2018   NEUTROABS 4.5 11/10/2018   HGB 9.9 (L) 11/10/2018   HCT 30.8 (L) 11/10/2018   MCV 90.1 11/10/2018   PLT 249 11/10/2018     STUDIES: Ct Abdomen Pelvis Wo Contrast  Result Date: 11/04/2018 CLINICAL DATA:  History of left nephrectomy, 01/01/2017 for renal cell carcinoma. EXAM: CT ABDOMEN AND PELVIS WITHOUT CONTRAST TECHNIQUE: Multidetector CT imaging of the abdomen and pelvis was performed following the standard protocol without IV contrast. COMPARISON:  CT scan 05/06/2018 FINDINGS: Lower chest: The lung bases are clear of an acute process. The heart  is upper limits of normal in size. No pericardial effusion. The distal esophagus is grossly normal. Hepatobiliary: No focal hepatic lesions or intrahepatic biliary dilatation. Stable 2 cm gallstone noted the gallbladder. No findings for acute cholecystitis. No common bile duct dilatation. Pancreas: No mass, inflammation or ductal dilatation. Spleen: Normal size.  No focal lesions. Adrenals/Urinary Tract: Stable right adrenal gland lesion most likely a lipid poor adenoma. No significant change since 2017. The left adrenal gland is normal. Status post left nephrectomy. No findings suspicious for residual or recurrent tumor in the nephrectomy bed. Numerous hyperdense/hemorrhagic right renal cysts are again demonstrated. No new or progressive findings. The bladder is unremarkable.  Stable cystocele. Stomach/Bowel: The stomach, duodenum, small bowel and colon are grossly normal without oral contrast. No inflammatory changes, mass lesions or obstructive findings. The terminal ileum and appendix are normal. Colonic diverticulosis without findings for acute diverticulitis.  Vascular/Lymphatic: The aorta is normal in caliber. Scattered atheroscerlotic calcifications. No mesenteric of retroperitoneal mass or adenopathy. Small scattered lymph nodes are noted. Reproductive: The uterus is surgically absent. I believe both ovaries are still present and appear normal. Other: No pelvic mass or adenopathy. No free pelvic fluid collections. No inguinal mass or adenopathy. No abdominal wall hernia or subcutaneous lesions. Musculoskeletal: No significant bony findings. IMPRESSION: 1. Status post left nephrectomy. No findings for residual or recurrent tumor in the nephrectomy bed. 2. Stable right adrenal gland lesion, likely lipid poor adenoma. 3. Numerous right renal lesions several of which demonstrate hyperdensity and most consistent with hemorrhagic cysts. No new or progressive findings involving the right kidney. 4. Cholelithiasis. 5. Stable cystocele. Electronically Signed   By: Marijo Sanes M.D.   On: 11/04/2018 15:17    ASSESSMENT: Stage III renal cell carcinoma, left.  PLAN:  1. Stage III renal cell carcinoma, left: Patient underwent nephrectomy on January 01, 2017. CT scan results from November 04, 2018 reviewed independently and report as above with no obvious evidence of recurrent or progressive disease. According NCCN guidelines, patient will require abdominal/pelvic CT scan approximately every 6 months for 3 years after her nephrectomy. She then will require annual imaging for a total of 5 years.  Return to clinic in 6 months with repeat imaging using noncontrast CT scan and further evaluation.  2. Anemia: Patient's hemoglobin is decreased, but relatively stable.  Given her chronic renal insufficiency she may benefit from Procrit in the future. 3. Chronic renal insufficiency: Patient's creatinine appears to be approximately at her baseline.  Appreciate nephrology input.  Patient expressed understanding and was in agreement with this plan. She also understands that She can  call clinic at any time with any questions, concerns, or complaints.   Cancer Staging Renal cancer, left [facility]) Staging form: Kidney, AJCC 8th Edition - Clinical stage from 01/26/2017: Stage III (cT3a, cNX, cM0) - Signed by Lloyd Huger, MD on 01/26/2017   Lloyd Huger, MD   11/12/2018 6:48 AM

## 2018-11-08 ENCOUNTER — Other Ambulatory Visit: Payer: Self-pay | Admitting: *Deleted

## 2018-11-08 DIAGNOSIS — C642 Malignant neoplasm of left kidney, except renal pelvis: Secondary | ICD-10-CM

## 2018-11-09 ENCOUNTER — Other Ambulatory Visit: Payer: Self-pay

## 2018-11-09 ENCOUNTER — Ambulatory Visit: Payer: Self-pay | Admitting: Oncology

## 2018-11-10 ENCOUNTER — Inpatient Hospital Stay: Payer: Commercial Managed Care - HMO | Attending: Oncology

## 2018-11-10 ENCOUNTER — Inpatient Hospital Stay (HOSPITAL_BASED_OUTPATIENT_CLINIC_OR_DEPARTMENT_OTHER): Payer: Commercial Managed Care - HMO | Admitting: Oncology

## 2018-11-10 ENCOUNTER — Other Ambulatory Visit: Payer: Self-pay

## 2018-11-10 ENCOUNTER — Encounter: Payer: Self-pay | Admitting: Oncology

## 2018-11-10 VITALS — BP 152/79 | HR 71 | Temp 97.8°F | Resp 20 | Ht 66.0 in | Wt 213.0 lb

## 2018-11-10 DIAGNOSIS — Z720 Tobacco use: Secondary | ICD-10-CM | POA: Diagnosis not present

## 2018-11-10 DIAGNOSIS — C642 Malignant neoplasm of left kidney, except renal pelvis: Secondary | ICD-10-CM

## 2018-11-10 DIAGNOSIS — N189 Chronic kidney disease, unspecified: Secondary | ICD-10-CM | POA: Insufficient documentation

## 2018-11-10 DIAGNOSIS — Z79899 Other long term (current) drug therapy: Secondary | ICD-10-CM | POA: Insufficient documentation

## 2018-11-10 DIAGNOSIS — D649 Anemia, unspecified: Secondary | ICD-10-CM | POA: Diagnosis not present

## 2018-11-10 LAB — COMPREHENSIVE METABOLIC PANEL
ALBUMIN: 3.7 g/dL (ref 3.5–5.0)
ALT: 12 U/L (ref 0–44)
AST: 16 U/L (ref 15–41)
Alkaline Phosphatase: 68 U/L (ref 38–126)
Anion gap: 5 (ref 5–15)
BILIRUBIN TOTAL: 0.5 mg/dL (ref 0.3–1.2)
BUN: 33 mg/dL — AB (ref 8–23)
CHLORIDE: 111 mmol/L (ref 98–111)
CO2: 24 mmol/L (ref 22–32)
CREATININE: 1.66 mg/dL — AB (ref 0.44–1.00)
Calcium: 9.4 mg/dL (ref 8.9–10.3)
GFR calc non Af Amer: 32 mL/min — ABNORMAL LOW (ref >60)
GFR, EST AFRICAN AMERICAN: 37 mL/min — AB (ref >60)
Glucose, Bld: 93 mg/dL (ref 70–99)
Potassium: 3.9 mmol/L (ref 3.5–5.1)
Sodium: 140 mmol/L (ref 135–145)
Total Protein: 7.1 g/dL (ref 6.5–8.1)

## 2018-11-10 LAB — CBC WITH DIFFERENTIAL/PLATELET
Abs Immature Granulocytes: 0.02 10*3/uL (ref 0.00–0.07)
Basophils Absolute: 0 10*3/uL (ref 0.0–0.1)
Basophils Relative: 1 %
Eosinophils Absolute: 0.1 10*3/uL (ref 0.0–0.5)
Eosinophils Relative: 1 %
HCT: 30.8 % — ABNORMAL LOW (ref 36.0–46.0)
Hemoglobin: 9.9 g/dL — ABNORMAL LOW (ref 12.0–15.0)
Immature Granulocytes: 0 %
Lymphocytes Relative: 30 %
Lymphs Abs: 2.3 10*3/uL (ref 0.7–4.0)
MCH: 28.9 pg (ref 26.0–34.0)
MCHC: 32.1 g/dL (ref 30.0–36.0)
MCV: 90.1 fL (ref 80.0–100.0)
Monocytes Absolute: 0.6 10*3/uL (ref 0.1–1.0)
Monocytes Relative: 8 %
Neutro Abs: 4.5 10*3/uL (ref 1.7–7.7)
Neutrophils Relative %: 60 %
Platelets: 249 10*3/uL (ref 150–400)
RBC: 3.42 MIL/uL — ABNORMAL LOW (ref 3.87–5.11)
RDW: 14.2 % (ref 11.5–15.5)
WBC: 7.5 10*3/uL (ref 4.0–10.5)
nRBC: 0 % (ref 0.0–0.2)

## 2018-11-25 ENCOUNTER — Other Ambulatory Visit: Payer: Self-pay | Admitting: Unknown Physician Specialty

## 2018-11-25 NOTE — Telephone Encounter (Signed)
Requested Prescriptions  Pending Prescriptions Disp Refills  . benazepril-hydrochlorthiazide (LOTENSIN HCT) 20-25 MG tablet [Pharmacy Med Name: BENAZEPRIL-HCTZ 20-25 MG TAB] 90 tablet 0    Sig: TAKE 1 TABLET BY MOUTH EVERY DAY     Cardiovascular:  ACEI + Diuretic Combos Failed - 11/25/2018  9:42 AM      Failed - Cr in normal range and within 180 days    Creatinine, Ser  Date Value Ref Range Status  11/10/2018 1.66 (H) 0.44 - 1.00 mg/dL Final         Failed - Last BP in normal range    BP Readings from Last 1 Encounters:  11/10/18 (!) 152/79         Passed - Na in normal range and within 180 days    Sodium  Date Value Ref Range Status  11/10/2018 140 135 - 145 mmol/L Final  06/27/2018 144 134 - 144 mmol/L Final         Passed - K in normal range and within 180 days    Potassium  Date Value Ref Range Status  11/10/2018 3.9 3.5 - 5.1 mmol/L Final         Passed - Ca in normal range and within 180 days    Calcium  Date Value Ref Range Status  11/10/2018 9.4 8.9 - 10.3 mg/dL Final         Passed - Patient is not pregnant      Passed - Valid encounter within last 6 months    Recent Outpatient Visits          5 months ago Chronic kidney disease, stage 3 ([place])   [place] Kathrine Haddock, NP   10 months ago Pure hypercholesterolemia   [place] Kathrine Haddock, NP   1 year ago Medicare annual wellness visit, initial   [place], NP   1 year ago Cellulitis of right lower extremity   [facility] Kathrine Haddock, NP   1 year ago Cellulitis of right lower extremity   [facility] Kathrine Haddock, NP      Future Appointments            In 1 month Cannady, Barbaraann Faster, NP [employer], [place]   In 5 months Hollice Espy, MD [place]   In 6 months  [facility], [place]

## 2018-12-14 DIAGNOSIS — N281 Cyst of kidney, acquired: Secondary | ICD-10-CM | POA: Diagnosis not present

## 2018-12-14 DIAGNOSIS — N183 Chronic kidney disease, stage 3 (moderate): Secondary | ICD-10-CM | POA: Diagnosis not present

## 2018-12-14 DIAGNOSIS — I10 Essential (primary) hypertension: Secondary | ICD-10-CM | POA: Diagnosis not present

## 2018-12-14 DIAGNOSIS — R600 Localized edema: Secondary | ICD-10-CM | POA: Diagnosis not present

## 2018-12-14 DIAGNOSIS — Z905 Acquired absence of kidney: Secondary | ICD-10-CM | POA: Diagnosis not present

## 2018-12-19 ENCOUNTER — Encounter: Payer: Self-pay | Admitting: Nurse Practitioner

## 2018-12-19 ENCOUNTER — Other Ambulatory Visit: Payer: Self-pay

## 2018-12-27 DIAGNOSIS — H43811 Vitreous degeneration, right eye: Secondary | ICD-10-CM | POA: Diagnosis not present

## 2018-12-28 ENCOUNTER — Other Ambulatory Visit: Payer: Self-pay

## 2018-12-28 ENCOUNTER — Ambulatory Visit (INDEPENDENT_AMBULATORY_CARE_PROVIDER_SITE_OTHER): Payer: 59 | Admitting: Nurse Practitioner

## 2018-12-28 ENCOUNTER — Encounter: Payer: Self-pay | Admitting: Nurse Practitioner

## 2018-12-28 VITALS — BP 136/80 | HR 59 | Temp 97.9°F | Ht 66.0 in | Wt 214.0 lb

## 2018-12-28 DIAGNOSIS — Z23 Encounter for immunization: Secondary | ICD-10-CM | POA: Diagnosis not present

## 2018-12-28 DIAGNOSIS — E7800 Pure hypercholesterolemia, unspecified: Secondary | ICD-10-CM

## 2018-12-28 DIAGNOSIS — N183 Chronic kidney disease, stage 3 unspecified: Secondary | ICD-10-CM

## 2018-12-28 DIAGNOSIS — I1310 Hypertensive heart and chronic kidney disease without heart failure, with stage 1 through stage 4 chronic kidney disease, or unspecified chronic kidney disease: Secondary | ICD-10-CM

## 2018-12-28 DIAGNOSIS — F324 Major depressive disorder, single episode, in partial remission: Secondary | ICD-10-CM | POA: Diagnosis not present

## 2018-12-28 MED ORDER — BENAZEPRIL-HYDROCHLOROTHIAZIDE 20-25 MG PO TABS: 1.0000 | ORAL_TABLET | Freq: Every day | ORAL | 3 refills | Status: DC

## 2018-12-28 MED ORDER — ATENOLOL 100 MG PO TABS: 100.0000 mg | ORAL_TABLET | Freq: Every day | ORAL | 1 refills | Status: DC

## 2018-12-28 MED ORDER — CITALOPRAM HYDROBROMIDE 20 MG PO TABS: 20.0000 mg | ORAL_TABLET | Freq: Every day | ORAL | 1 refills | Status: DC

## 2018-12-28 MED ORDER — AMLODIPINE BESYLATE 5 MG PO TABS: 5.0000 mg | ORAL_TABLET | Freq: Every day | ORAL | 1 refills | Status: DC

## 2018-12-28 NOTE — Patient Instructions (Signed)

## 2018-12-28 NOTE — Assessment & Plan Note (Signed)
Chronic, ongoing.  Continue current medication regimen.

## 2018-12-28 NOTE — Progress Notes (Signed)
BP 136/80   Pulse (!) 59   Temp 97.9 F (36.6 C) (Oral)   Ht 5\' 6"  (1.676 m)   Wt 214 lb (97.1 kg)   SpO2 98%   BMI 34.54 kg/m    Subjective:    Patient ID: Sarah [name], female    DOB: 07/30/1951, 68 y.o.   MRN: [id number]  HPI: Sarah [name] is a 68 y.o. female  Chief Complaint  Patient presents with  . Follow-up    21m  . Depression  . Hypertension  . Hyperlipidemia   DEPRESSION Mood status: controlled Satisfied with current treatment?: yes Symptom severity: mild  Duration of current treatment : chronic Side effects: no Medication compliance: excellent compliance Psychotherapy/counseling: none Anxious mood: no Anhedonia: no Significant weight loss or gain: no Insomnia: none Fatigue: no Feelings of worthlessness or guilt: no Impaired concentration/indecisiveness: no Suicidal ideations: no Hopelessness: no Crying spells: no Depression screen [facility] 2/9 12/28/2018 06/27/2018 05/19/2018 01/07/2018 06/22/2017  Decreased Interest 0 0 0 0 0  Down, Depressed, Hopeless 0 0 0 0 0  PHQ - 2 Score 0 0 0 0 0  Altered sleeping 0 0 - 0 1  Tired, decreased energy 0 1 - 0 1  Change in appetite 0 1 - 0 1  Feeling bad or failure about yourself  0 0 - 0 0  Trouble concentrating 0 0 - 0 0  Moving slowly or fidgety/restless 0 0 - 0 0  Suicidal thoughts 0 0 - 0 0  PHQ-9 Score 0 2 - 0 3  Difficult doing work/chores Not difficult at all Not difficult at all - - -    HYPERTENSION / [place] Satisfied with current treatment? yes Duration of hypertension: chronic BP monitoring frequency: rarely BP range: 130/80's BP medication side effects: no Duration of hyperlipidemia: chronic Cholesterol medication side effects: no Cholesterol supplements: none Medication compliance: good compliance Aspirin: no Recent stressors: no Recurrent headaches: no Visual changes: no Palpitations: no Dyspnea: no Chest pain: no Lower extremity edema: no Dizzy/lightheaded: no  Relevant  past medical, surgical, family and social history reviewed and updated as indicated. Interim medical history since our last visit reviewed. Allergies and medications reviewed and updated.  Review of Systems  Constitutional: Negative for activity change, appetite change, diaphoresis, fatigue and fever.  Respiratory: Negative for cough, chest tightness and shortness of breath.   Cardiovascular: Negative for chest pain, palpitations and leg swelling.  Gastrointestinal: Negative for abdominal distention, abdominal pain, constipation, diarrhea, nausea and vomiting.  Endocrine: Negative for cold intolerance, heat intolerance, polydipsia, polyphagia and polyuria.  Neurological: Negative for dizziness, syncope, weakness, light-headedness, numbness and headaches.  Psychiatric/Behavioral: Negative.     Per HPI unless specifically indicated above     Objective:    BP 136/80   Pulse (!) 59   Temp 97.9 F (36.6 C) (Oral)   Ht 5\' 6"  (1.676 m)   Wt 214 lb (97.1 kg)   SpO2 98%   BMI 34.54 kg/m   Wt Readings from Last 3 Encounters:  12/28/18 214 lb (97.1 kg)  11/10/18 213 lb (96.6 kg)  06/27/18 219 lb 3.2 oz (99.4 kg)    Physical Exam Vitals signs and nursing note reviewed.  Constitutional:      General: She is awake.     Appearance: She is well-developed.  HENT:     Head: Normocephalic.     Right Ear: Hearing normal.     Left Ear: Hearing normal.     Nose: Nose normal.  Mouth/Throat:     Mouth: Mucous membranes are moist.  Eyes:     General: Lids are normal.        Right eye: No discharge.        Left eye: No discharge.     Conjunctiva/sclera: Conjunctivae normal.     Pupils: Pupils are equal, round, and reactive to light.  Neck:     Musculoskeletal: Normal range of motion and neck supple.     Thyroid: No thyromegaly.     Vascular: No carotid bruit or JVD.  Cardiovascular:     Rate and Rhythm: Normal rate and regular rhythm.     Heart sounds: Normal heart sounds. No murmur.  No gallop.   Pulmonary:     Effort: Pulmonary effort is normal.     Breath sounds: Normal breath sounds.  Abdominal:     General: Bowel sounds are normal.     Palpations: Abdomen is soft. There is no hepatomegaly or splenomegaly.  Musculoskeletal:     Right lower leg: No edema.     Left lower leg: No edema.  Lymphadenopathy:     Cervical: No cervical adenopathy.  Skin:    General: Skin is warm and dry.  Neurological:     Mental Status: She is alert and oriented to person, place, and time.  Psychiatric:        Attention and Perception: Attention normal.        Mood and Affect: Mood normal.        Behavior: Behavior normal. Behavior is cooperative.        Thought Content: Thought content normal.        Judgment: Judgment normal.     Results for orders placed or performed in visit on 11/10/18  Comprehensive metabolic panel  Result Value Ref Range   Sodium 140 135 - 145 mmol/L   Potassium 3.9 3.5 - 5.1 mmol/L   Chloride 111 98 - 111 mmol/L   CO2 24 22 - 32 mmol/L   Glucose, Bld 93 70 - 99 mg/dL   BUN 33 (H) 8 - 23 mg/dL   Creatinine, Ser 1.66 (H) 0.44 - 1.00 mg/dL   Calcium 9.4 8.9 - 10.3 mg/dL   Total Protein 7.1 6.5 - 8.1 g/dL   Albumin 3.7 3.5 - 5.0 g/dL   AST 16 15 - 41 U/L   ALT 12 0 - 44 U/L   Alkaline Phosphatase 68 38 - 126 U/L   Total Bilirubin 0.5 0.3 - 1.2 mg/dL   GFR calc non Af Amer 32 (L) >60 mL/min   GFR calc Af Amer 37 (L) >60 mL/min   Anion gap 5 5 - 15  CBC with Differential/Platelet  Result Value Ref Range   WBC 7.5 4.0 - 10.5 K/uL   RBC 3.42 (L) 3.87 - 5.11 MIL/uL   Hemoglobin 9.9 (L) 12.0 - 15.0 g/dL   HCT 30.8 (L) 36.0 - 46.0 %   MCV 90.1 80.0 - 100.0 fL   MCH 28.9 26.0 - 34.0 pg   MCHC 32.1 30.0 - 36.0 g/dL   RDW 14.2 11.5 - 15.5 %   Platelets 249 150 - 400 K/uL   nRBC 0.0 0.0 - 0.2 %   Neutrophils Relative % 60 %   Neutro Abs 4.5 1.7 - 7.7 K/uL   Lymphocytes Relative 30 %   Lymphs Abs 2.3 0.7 - 4.0 K/uL   Monocytes Relative 8 %    Monocytes Absolute 0.6 0.1 - 1.0 K/uL   Eosinophils  Relative 1 %   Eosinophils Absolute 0.1 0.0 - 0.5 K/uL   Basophils Relative 1 %   Basophils Absolute 0.0 0.0 - 0.1 K/uL   Immature Granulocytes 0 %   Abs Immature Granulocytes 0.02 0.00 - 0.07 K/uL      Assessment & Plan:   Problem List Items Addressed This Visit      Cardiovascular and Mediastinum   Hypertensive heart/kidney disease without HF and with CKD stage III ([place]) - Primary    Followed by nephrology.  BP at goal today.  Continue current medication regimen and collaboration with nephrology.      Relevant Medications   amLODipine (NORVASC) 5 MG tablet   atenolol (TENORMIN) 100 MG tablet   benazepril-hydrochlorthiazide (LOTENSIN HCT) 20-25 MG tablet     Other   Hyperlipidemia    Chronic, ongoing.  Continue current medication regimen.  Lipid panel next visit.      Relevant Medications   amLODipine (NORVASC) 5 MG tablet   atenolol (TENORMIN) 100 MG tablet   benazepril-hydrochlorthiazide (LOTENSIN HCT) 20-25 MG tablet   Depression    Chronic, ongoing.  Continue current medication regimen.        Relevant Medications   citalopram (CELEXA) 20 MG tablet    Other Visit Diagnoses    Need for pneumococcal vaccine       Relevant Orders   Pneumococcal polysaccharide vaccine 23-valent greater than or equal to 2yo subcutaneous/IM       Follow up plan: Return in about 6 months (around 06/28/2019) for HTN/HLD .

## 2018-12-28 NOTE — Assessment & Plan Note (Signed)
Followed by nephrology.  BP at goal today.  Continue current medication regimen and collaboration with nephrology.

## 2018-12-28 NOTE — Assessment & Plan Note (Signed)
Chronic, ongoing.  Continue current medication regimen.  Lipid panel next visit.

## 2019-04-05 DIAGNOSIS — N183 Chronic kidney disease, stage 3 (moderate): Secondary | ICD-10-CM | POA: Diagnosis not present

## 2019-04-05 DIAGNOSIS — Z905 Acquired absence of kidney: Secondary | ICD-10-CM | POA: Diagnosis not present

## 2019-04-05 DIAGNOSIS — I129 Hypertensive chronic kidney disease with stage 1 through stage 4 chronic kidney disease, or unspecified chronic kidney disease: Secondary | ICD-10-CM | POA: Diagnosis not present

## 2019-05-14 NOTE — Progress Notes (Deleted)
[name]  [phone] [phone]  ID: Sarah [name] OB: 01/30/1951  MR#: [id number]  XHB#:[id number]  Patient Care Team: Venita Lick, NP as PCP - General (Nurse Practitioner) Nickie Retort, MD as Consulting Physician (Urology) Lloyd Huger, MD as Consulting Physician (Oncology)  CHIEF COMPLAINT: Stage III renal cell carcinoma, left.  INTERVAL HISTORY: Patient returns to clinic today for routine 53-month evaluation and discussion of her imaging results.  She currently feels well and is asymptomatic. She has no neurologic complaints. She denies any recent fevers or illnesses. She has a good appetite and denies weight loss. She has no chest pain or shortness of breath.  She has no abdominal pain.  She denies any nausea, vomiting, constipation, or diarrhea. She has no urinary complaints.  Patient feels at her baseline offers no specific complaints today.  REVIEW OF SYSTEMS:   Review of Systems  Constitutional: Negative.  Negative for fever, malaise/fatigue and weight loss.  Respiratory: Negative.  Negative for cough and shortness of breath.   Cardiovascular: Negative.  Negative for chest pain and leg swelling.  Gastrointestinal: Negative.  Negative for abdominal pain.  Genitourinary: Negative.  Negative for flank pain and hematuria.  Musculoskeletal: Negative.  Negative for back pain.  Skin: Negative.  Negative for rash.  Neurological: Negative.  Negative for sensory change, focal weakness and weakness.  Psychiatric/Behavioral: Negative.  The patient is not nervous/anxious.     As per HPI. Otherwise, a complete review of systems is negative.  PAST MEDICAL HISTORY: Past Medical History:  Diagnosis Date  . Abnormal mammogram, unspecified   . Aneurysm ([place]) 2004   cerebral x  4, [facility]  . Anxiety   . Arthritis   . Benign neoplasm of breast 2012  . Breast complaint 2010  . Chronic kidney disease    Renal Cell  Cancer  . Hypertension   . Obesity, unspecified   . Personal history of tobacco use, presenting hazards to health   . Renal cancer, left ([place]) 11/2016   Renal Cell with Left nephrectomy.  Marland Kitchen Special screening for malignant neoplasms, colon     PAST SURGICAL HISTORY: Past Surgical History:  Procedure Laterality Date  . ABDOMINAL HYSTERECTOMY  1993  . BREAST BIOPSY Right 2012   stereo bx showing a 37mm radial scar in an estimated 8 volume of tissue  . BREAST BIOPSY Right Apr 09, 2005   Fibrocystic changes, ductal adenosis, microcalcifications, focal stromal/epithelial proliferation, possible fibroadenoma type proliferation  . CEREBRAL ANEURYSM REPAIR  2004  . COLONOSCOPY  2008   Dr. Nicolasa Ducking  . EYE SURGERY    . FOOT SURGERY Right 2003  . LAPAROSCOPIC NEPHRECTOMY, HAND ASSISTED Left 01/01/2017   Procedure: HAND ASSISTED LAPAROSCOPIC NEPHRECTOMY;  Surgeon: Nickie Retort, MD;  Location: [facility] ORS;  Service: Urology;  Laterality: Left;  . TONSILLECTOMY  1997    FAMILY HISTORY: Family History  Problem Relation Age of Onset  . Cancer Cousin        maternal first cousin with breast cancer  . Cancer Other        colon and ovarian cancers, no relationship listed  . Cancer Father        prostate  . Hypertension Father   . Hypertension Sister   . Hypertension Brother   . Hypertension Paternal Grandmother   . Heart disease Paternal Grandmother   . Hypertension Paternal Grandfather   . Heart disease Paternal Grandfather   . Breast cancer Neg Hx  ADVANCED DIRECTIVES (Y/N):  N  HEALTH MAINTENANCE: Social History   Tobacco Use  . Smoking status: Current Every Day Smoker    Packs/day: 0.25    Years: 40.00    Pack years: 10.00    Types: Cigarettes  . Smokeless tobacco: Never Used  Substance Use Topics  . Alcohol use: Yes    Comment: socially  . Drug use: No     Colonoscopy:  PAP:  Bone density:  Lipid panel:  Allergies  Allergen Reactions  . Codeine Itching     Current Outpatient Medications  Medication Sig Dispense Refill  . acetaminophen (TYLENOL) 325 MG tablet Take 650 mg by mouth every 6 (six) hours as needed for mild pain, moderate pain or headache.     Marland Kitchen amLODipine (NORVASC) 5 MG tablet Take 1 tablet (5 mg total) by mouth daily. 90 tablet 1  . atenolol (TENORMIN) 100 MG tablet Take 1 tablet (100 mg total) by mouth daily. 90 tablet 1  . atorvastatin (LIPITOR) 40 MG tablet Take 1 tablet (40 mg total) by mouth daily. 90 tablet 3  . benazepril-hydrochlorthiazide (LOTENSIN HCT) 20-25 MG tablet Take 1 tablet by mouth daily. 90 tablet 3  . Cholecalciferol (VITAMIN D-3 PO) Take 600 Units by mouth daily.    . citalopram (CELEXA) 20 MG tablet Take 1 tablet (20 mg total) by mouth daily. 90 tablet 1  . Multiple Vitamin (MULTIVITAMIN) tablet Take 1 tablet by mouth daily.    . traMADol (ULTRAM) 50 MG tablet Take 1 tablet (50 mg total) by mouth every 12 (twelve) hours as needed. 30 tablet 1   No current facility-administered medications for this visit.     OBJECTIVE: There were no vitals filed for this visit.   There is no height or weight on file to calculate BMI.    ECOG FS:0 - Asymptomatic  General: Well-developed, well-nourished, no acute distress. Eyes: Pink conjunctiva, anicteric sclera. HEENT: Normocephalic, moist mucous membranes. Lungs: Clear to auscultation bilaterally. Heart: Regular rate and rhythm. No rubs, murmurs, or gallops. Abdomen: Soft, nontender, nondistended. No organomegaly noted, normoactive bowel sounds. Musculoskeletal: No edema, cyanosis, or clubbing. Neuro: Alert, answering all questions appropriately. Cranial nerves grossly intact. Skin: No rashes or petechiae noted. Psych: Normal affect.  LAB RESULTS:  Lab Results  Component Value Date   NA 140 11/10/2018   K 3.9 11/10/2018   CL 111 11/10/2018   CO2 24 11/10/2018   GLUCOSE 93 11/10/2018   BUN 33 (H) 11/10/2018   CREATININE 1.66 (H) 11/10/2018   CALCIUM 9.4  11/10/2018   PROT 7.1 11/10/2018   ALBUMIN 3.7 11/10/2018   AST 16 11/10/2018   ALT 12 11/10/2018   ALKPHOS 68 11/10/2018   BILITOT 0.5 11/10/2018   GFRNONAA 32 (L) 11/10/2018   GFRAA 37 (L) 11/10/2018    Lab Results  Component Value Date   WBC 7.5 11/10/2018   NEUTROABS 4.5 11/10/2018   HGB 9.9 (L) 11/10/2018   HCT 30.8 (L) 11/10/2018   MCV 90.1 11/10/2018   PLT 249 11/10/2018     STUDIES: No results found.  ASSESSMENT: Stage III renal cell carcinoma, left.  PLAN:  1. Stage III renal cell carcinoma, left: Patient underwent nephrectomy on January 01, 2017. CT scan results from November 04, 2018 reviewed independently and report as above with no obvious evidence of recurrent or progressive disease. According NCCN guidelines, patient will require abdominal/pelvic CT scan approximately every 6 months for 3 years after her nephrectomy. She then will require annual imaging  for a total of 5 years.  Return to clinic in 6 months with repeat imaging using noncontrast CT scan and further evaluation.  2. Anemia: Patient's hemoglobin is decreased, but relatively stable.  Given her chronic renal insufficiency she may benefit from Procrit in the future. 3. Chronic renal insufficiency: Patient's creatinine appears to be approximately at her baseline.  Appreciate nephrology input.  Patient expressed understanding and was in agreement with this plan. She also understands that She can call clinic at any time with any questions, concerns, or complaints.   Cancer Staging Renal cancer, left [facility]) Staging form: Kidney, AJCC 8th Edition - Clinical stage from 01/26/2017: Stage III (cT3a, cNX, cM0) - Signed by Lloyd Huger, MD on 01/26/2017   Lloyd Huger, MD   05/14/2019 10:50 AM

## 2019-05-15 ENCOUNTER — Ambulatory Visit: Admission: RE | Admit: 2019-05-15 | Payer: 59 | Source: Ambulatory Visit

## 2019-05-15 ENCOUNTER — Ambulatory Visit: Payer: 59

## 2019-05-16 ENCOUNTER — Ambulatory Visit: Payer: Medicare Other | Admitting: Urology

## 2019-05-16 ENCOUNTER — Other Ambulatory Visit: Payer: Self-pay

## 2019-05-17 ENCOUNTER — Other Ambulatory Visit: Payer: Self-pay

## 2019-05-17 ENCOUNTER — Inpatient Hospital Stay: Payer: 59 | Admitting: Oncology

## 2019-05-17 ENCOUNTER — Inpatient Hospital Stay: Payer: 59 | Attending: Oncology

## 2019-05-17 DIAGNOSIS — C642 Malignant neoplasm of left kidney, except renal pelvis: Secondary | ICD-10-CM | POA: Diagnosis present

## 2019-05-17 LAB — CBC WITH DIFFERENTIAL/PLATELET
Abs Immature Granulocytes: 0.01 10*3/uL (ref 0.00–0.07)
Basophils Absolute: 0 10*3/uL (ref 0.0–0.1)
Basophils Relative: 1 %
Eosinophils Absolute: 0.1 10*3/uL (ref 0.0–0.5)
Eosinophils Relative: 1 %
HCT: 32.1 % — ABNORMAL LOW (ref 36.0–46.0)
Hemoglobin: 10.5 g/dL — ABNORMAL LOW (ref 12.0–15.0)
Immature Granulocytes: 0 %
Lymphocytes Relative: 38 %
Lymphs Abs: 2.7 10*3/uL (ref 0.7–4.0)
MCH: 29.3 pg (ref 26.0–34.0)
MCHC: 32.7 g/dL (ref 30.0–36.0)
MCV: 89.7 fL (ref 80.0–100.0)
Monocytes Absolute: 0.6 10*3/uL (ref 0.1–1.0)
Monocytes Relative: 8 %
Neutro Abs: 3.6 10*3/uL (ref 1.7–7.7)
Neutrophils Relative %: 52 %
Platelets: 268 10*3/uL (ref 150–400)
RBC: 3.58 MIL/uL — ABNORMAL LOW (ref 3.87–5.11)
RDW: 14.6 % (ref 11.5–15.5)
WBC: 7 10*3/uL (ref 4.0–10.5)
nRBC: 0 % (ref 0.0–0.2)

## 2019-05-17 LAB — COMPREHENSIVE METABOLIC PANEL
ALT: 13 U/L (ref 0–44)
AST: 16 U/L (ref 15–41)
Albumin: 3.8 g/dL (ref 3.5–5.0)
Alkaline Phosphatase: 70 U/L (ref 38–126)
Anion gap: 8 (ref 5–15)
BUN: 30 mg/dL — ABNORMAL HIGH (ref 8–23)
CO2: 25 mmol/L (ref 22–32)
Calcium: 9.4 mg/dL (ref 8.9–10.3)
Chloride: 108 mmol/L (ref 98–111)
Creatinine, Ser: 1.54 mg/dL — ABNORMAL HIGH (ref 0.44–1.00)
GFR calc Af Amer: 40 mL/min — ABNORMAL LOW (ref >60)
GFR calc non Af Amer: 35 mL/min — ABNORMAL LOW (ref >60)
Glucose, Bld: 91 mg/dL (ref 70–99)
Potassium: 4.1 mmol/L (ref 3.5–5.1)
Sodium: 141 mmol/L (ref 135–145)
Total Bilirubin: 0.2 mg/dL — ABNORMAL LOW (ref 0.3–1.2)
Total Protein: 7.4 g/dL (ref 6.5–8.1)

## 2019-05-18 ENCOUNTER — Telehealth: Payer: Self-pay | Admitting: *Deleted

## 2019-05-18 ENCOUNTER — Other Ambulatory Visit: Payer: Self-pay | Admitting: *Deleted

## 2019-05-18 DIAGNOSIS — C642 Malignant neoplasm of left kidney, except renal pelvis: Secondary | ICD-10-CM

## 2019-05-18 DIAGNOSIS — Z87891 Personal history of nicotine dependence: Secondary | ICD-10-CM

## 2019-05-18 DIAGNOSIS — Z122 Encounter for screening for malignant neoplasm of respiratory organs: Secondary | ICD-10-CM

## 2019-05-18 NOTE — Telephone Encounter (Signed)
Patient has been notified that annual lung cancer screening low dose CT scan is due currently or will be in near future. Confirmed that patient is within the age range of 55-77, and asymptomatic, (no signs or symptoms of lung cancer). Patient denies illness that would prevent curative treatment for lung cancer if found. Verified smoking history, (current, 41.75 pack year). The shared decision making visit was done 09/01/16. Patient is agreeable for CT scan being scheduled.

## 2019-05-20 NOTE — Progress Notes (Signed)
Sarah [name]  Telephone:([phone]) [phone] Fax:(336) [phone]  ID: Sarah [name] OB: Jan 23, 1951  MR#: [id number]  PXT#:[id number]  Patient Care Team: Venita Lick, NP as PCP - General (Nurse Practitioner) Nickie Retort, MD as Consulting Physician (Urology) Lloyd Huger, MD as Consulting Physician (Oncology)  I connected with Sarah [name] on 05/27/19 at  1:45 PM EDT by telephone visit and verified that I am speaking with the correct person using two identifiers.   I discussed the limitations, risks, security and privacy concerns of performing an evaluation and management service by telemedicine and the availability of in-person appointments. I also discussed with the patient that there may be a patient responsible charge related to this service. The patient expressed understanding and agreed to proceed.   Other persons participating in the visit and their role in the encounter: Patient, MD  Patients location: Home Providers location: Clinic  CHIEF COMPLAINT: Stage III renal cell carcinoma, left.  INTERVAL HISTORY: Patient agreed to evaluation and discussion of her imaging results via telephone.  She continues to feel well and remains asymptomatic. She has no neurologic complaints. She denies any recent fevers or illnesses. She has a good appetite and denies weight loss.  She denies any chest pain, shortness of breath, cough, or hemoptysis.  She has no abdominal pain.  She denies any nausea, vomiting, constipation, or diarrhea. She has no urinary complaints.  Patient feels at her baseline offers no specific complaints today.  REVIEW OF SYSTEMS:   Review of Systems  Constitutional: Negative.  Negative for fever, malaise/fatigue and weight loss.  Respiratory: Negative.  Negative for cough and shortness of breath.   Cardiovascular: Negative.  Negative for chest pain and leg swelling.  Gastrointestinal: Negative.  Negative for abdominal pain.    Genitourinary: Negative.  Negative for flank pain and hematuria.  Musculoskeletal: Negative.  Negative for back pain.  Skin: Negative.  Negative for rash.  Neurological: Negative.  Negative for sensory change, focal weakness and weakness.  Psychiatric/Behavioral: Negative.  The patient is not nervous/anxious.     As per HPI. Otherwise, a complete review of systems is negative.  PAST MEDICAL HISTORY: Past Medical History:  Diagnosis Date   Abnormal mammogram, unspecified    Aneurysm ([place]) 2004   cerebral x  4, [facility]   Anxiety    Arthritis    Benign neoplasm of breast 2012   Breast complaint 2010   Chronic kidney disease    Renal Cell Cancer   Hypertension    Obesity, unspecified    Personal history of tobacco use, presenting hazards to health    Renal cancer, left ([place]) 11/2016   Renal Cell with Left nephrectomy.   Special screening for malignant neoplasms, colon     PAST SURGICAL HISTORY: Past Surgical History:  Procedure Laterality Date   ABDOMINAL HYSTERECTOMY  1993   BREAST BIOPSY Right 2012   stereo bx showing a 92mm radial scar in an estimated 8 volume of tissue   BREAST BIOPSY Right Apr 09, 2005   Fibrocystic changes, ductal adenosis, microcalcifications, focal stromal/epithelial proliferation, possible fibroadenoma type proliferation   CEREBRAL ANEURYSM REPAIR  2004   COLONOSCOPY  2008   Dr. Nicolasa Ducking   EYE SURGERY     FOOT SURGERY Right 2003   LAPAROSCOPIC NEPHRECTOMY, HAND ASSISTED Left 01/01/2017   Procedure: HAND ASSISTED LAPAROSCOPIC NEPHRECTOMY;  Surgeon: Nickie Retort, MD;  Location: [facility];  Service: Urology;  Laterality: Left;   TONSILLECTOMY  1997  FAMILY HISTORY: Family History  Problem Relation Age of Onset   Cancer Cousin        maternal first cousin with breast cancer   Cancer Other        colon and ovarian cancers, no relationship listed   Cancer Father        prostate   Hypertension Father     Hypertension Sister    Hypertension Brother    Hypertension Paternal Grandmother    Heart disease Paternal Grandmother    Hypertension Paternal Grandfather    Heart disease Paternal Grandfather    Breast cancer Neg Hx     ADVANCED DIRECTIVES (Y/N):  N  HEALTH MAINTENANCE: Social History   Tobacco Use   Smoking status: Current Every Day Smoker    Packs/day: 0.25    Years: 40.00    Pack years: 10.00    Types: Cigarettes   Smokeless tobacco: Never Used  Substance Use Topics   Alcohol use: Yes    Comment: socially   Drug use: No     Colonoscopy:  PAP:  Bone density:  Lipid panel:  Allergies  Allergen Reactions   Codeine Itching    Current Outpatient Medications  Medication Sig Dispense Refill   acetaminophen (TYLENOL) 325 MG tablet Take 650 mg by mouth every 6 (six) hours as needed for mild pain, moderate pain or headache.      amLODipine (NORVASC) 5 MG tablet Take 1 tablet (5 mg total) by mouth daily. 90 tablet 1   atenolol (TENORMIN) 100 MG tablet Take 1 tablet (100 mg total) by mouth daily. 90 tablet 1   atorvastatin (LIPITOR) 40 MG tablet Take 1 tablet (40 mg total) by mouth daily. 90 tablet 3   benazepril-hydrochlorthiazide (LOTENSIN HCT) 20-25 MG tablet Take 1 tablet by mouth daily. 90 tablet 3   Cholecalciferol (VITAMIN D-3 PO) Take 600 Units by mouth daily.     citalopram (CELEXA) 20 MG tablet Take 1 tablet (20 mg total) by mouth daily. 90 tablet 1   Multiple Vitamin (MULTIVITAMIN) tablet Take 1 tablet by mouth daily.     traMADol (ULTRAM) 50 MG tablet Take 1 tablet (50 mg total) by mouth every 12 (twelve) hours as needed. 30 tablet 1   No current facility-administered medications for this visit.     OBJECTIVE: There were no vitals filed for this visit.   There is no height or weight on file to calculate BMI.    ECOG FS:0 - Asymptomatic   LAB RESULTS:  Lab Results  Component Value Date   NA 141 05/17/2019   K 4.1 05/17/2019   CL  108 05/17/2019   CO2 25 05/17/2019   GLUCOSE 91 05/17/2019   BUN 30 (H) 05/17/2019   CREATININE 1.54 (H) 05/17/2019   CALCIUM 9.4 05/17/2019   PROT 7.4 05/17/2019   ALBUMIN 3.8 05/17/2019   AST 16 05/17/2019   ALT 13 05/17/2019   ALKPHOS 70 05/17/2019   BILITOT 0.2 (L) 05/17/2019   GFRNONAA 35 (L) 05/17/2019   GFRAA 40 (L) 05/17/2019    Lab Results  Component Value Date   WBC 7.0 05/17/2019   NEUTROABS 3.6 05/17/2019   HGB 10.5 (L) 05/17/2019   HCT 32.1 (L) 05/17/2019   MCV 89.7 05/17/2019   PLT 268 05/17/2019     STUDIES: Ct Abdomen Wo Contrast  Result Date: 05/25/2019 CLINICAL DATA:  68 year old female with history of stage III renal cell carcinoma status post left nephrectomy in 2018. Follow-up study. EXAM: CT ABDOMEN  WITHOUT CONTRAST TECHNIQUE: Multidetector CT imaging of the abdomen was performed following the standard protocol without IV contrast. COMPARISON:  CT the abdomen and pelvis 11/04/2018. FINDINGS: Lower chest: Aortic atherosclerosis. Hepatobiliary: No definite suspicious cystic or solid hepatic lesions are confidently identified on today's noncontrast CT examination. Partially peripherally calcified gallstone in the gallbladder measuring 2.7 x 1.8 cm. Gallbladder is nearly completely contracted, without surrounding inflammatory changes. Pancreas: No definite pancreatic mass or peripancreatic fluid collections or inflammatory changes are noted on today's noncontrast CT examination. Spleen: Unremarkable. Adrenals/Urinary Tract: Status post left nephrectomy. No unexpected soft tissue mass noted in the region of the nephrectomy bed to suggest residual or locally recurrent disease. Right kidney is again remarkable for multiple lesions of mixed attenuation ranging from low to high attenuation, similar to the prior study. The largest of these lesions is in the upper pole (axial image 19 of series 2) measuring 3.9 cm in diameter (45 HU). None of these lesions are adequately  characterized on today's noncontrast CT examination. No hydroureteronephrosis in the visualized portions of the abdomen. Bilateral adrenal glands are normal in appearance. Stomach/Bowel: Normal appearance of the stomach. No pathologic dilatation of visualized portions of small bowel or colon. Numerous colonic diverticulae are noted, particularly in the descending colon. Vascular/Lymphatic: Aortic atherosclerosis. Fusiform ectasia of the infrarenal abdominal aorta which measures up to 2.6 cm in diameter. No lymphadenopathy noted in the abdomen. Other: No significant volume of ascites and no pneumoperitoneum noted in the visualized portions of the peritoneal cavity. Musculoskeletal: No aggressive appearing osseous lesions are noted in the visualized portions of the skeleton. IMPRESSION: 1. Status post left nephrectomy with no findings to suggest residual or locally recurrent disease. No definite signs of metastatic disease in the abdomen on today's noncontrast CT examination. 2. Innumerable lesions in the right kidney incompletely characterized on today's non-contrast CT examination, but similar to the prior study, favored to represent a combination of proteinaceous and more simple cysts. 3. Aortic atherosclerosis. 4. Cholelithiasis without findings to suggest an acute cholecystitis at this time. 5. Colonic diverticulosis. Electronically Signed   By: Vinnie Langton M.D.   On: 05/25/2019 08:12   US Renal  Result Date: 05/25/2019 CLINICAL DATA:  Complex right renal cysts, remote left nephrectomy EXAM: RENAL / URINARY TRACT ULTRASOUND COMPLETE COMPARISON:  05/24/2019, 11/04/2018 FINDINGS: Right Kidney: Renal measurements: 11.5 x 5.5 x 5.1 cm = volume: 170 mL. Normal echogenicity and cortical thickness. No acute finding or hydronephrosis. Innumerable anechoic simple and minimally complex cysts noted throughout the right kidney. Largest cyst in the right kidney upper pole measures 4.7 x 3.6 x 4.0 cm and correlates  with a hyperdense lesion by noncontrast CT. Largest lower pole cyst measures 2.1 x 2.1 x 2.1 cm and also correlates with a hyperdense lesion by CT. These are favored to represent proteinaceous or hemorrhagic cysts. No solid right renal lesion demonstrated by ultrasound. Left Kidney: Surgically absent. Bladder: Appears normal for degree of bladder distention. IMPRESSION: Numerous right renal simple and minimally complex cysts measuring up to 4.7 cm in the upper pole. No definite solid right renal mass. No acute finding or hydronephrosis. Electronically Signed   By: Jerilynn Mages.  Shick M.D.   On: 05/25/2019 09:17   Ct Chest Lung Cancer Screening Low Dose Wo Contrast  Result Date: 05/25/2019 CLINICAL DATA:  68 year old asymptomatic female current smoker with 41.75 pack-year smoking history. History of left kidney cancer. EXAM: CT CHEST WITHOUT CONTRAST LOW-DOSE FOR LUNG CANCER SCREENING TECHNIQUE: Multidetector CT imaging of the chest  was performed following the standard protocol without IV contrast. COMPARISON:  04/27/2018 screening chest CT. FINDINGS: Cardiovascular: Normal heart size. No significant pericardial effusion/thickening. Left anterior descending and right coronary atherosclerosis. Atherosclerotic nonaneurysmal thoracic aorta. Stable dilated main pulmonary artery (3.9 cm diameter). Mediastinum/Nodes: No discrete thyroid nodules. Unremarkable esophagus. No pathologically enlarged axillary, mediastinal or hilar lymph nodes, noting limited sensitivity for the detection of hilar adenopathy on this noncontrast study. Lungs/Pleura: No pneumothorax. No pleural effusion. Mild centrilobular emphysema. No acute consolidative airspace disease or lung masses. No significant growth of previously visualized scattered pulmonary nodules. No new significant pulmonary nodules. Upper abdomen: Simple 2.2 cm upper right renal cyst. Separate mildly hyperdense 3.9 cm posterior upper right renal cortical lesion, not definitely changed,  characterized as hemorrhagic cyst on 04/16/2017 CT abdomen study. Right adrenal 2.6 cm nodule is stable in size with density 15 HU. Musculoskeletal: No aggressive appearing focal osseous lesions. Marked thoracic spondylosis. IMPRESSION: 1. Lung-RADS 2, benign appearance or behavior. Continue annual screening with low-dose chest CT without contrast in 12 months. 2. Two-vessel coronary atherosclerosis. 3. Dilated main pulmonary artery, stable, suggesting chronic pulmonary arterial hypertension. 4. Stable right adrenal nodule, presumably a benign adenoma. Aortic Atherosclerosis (ICD10-I70.0) and Emphysema (ICD10-J43.9). Electronically Signed   By: Ilona Sorrel M.D.   On: 05/25/2019 09:38    ASSESSMENT: Stage III renal cell carcinoma, left.  PLAN:  1. Stage III renal cell carcinoma, left: Patient underwent nephrectomy on January 01, 2017.  CT scan results from May 24, 2019 reviewed independently and reported as above with no obvious evidence of recurrent or progressive disease. According NCCN guidelines, patient will require abdominal/pelvic CT scan approximately every 6 months for 3 years after her nephrectomy. She then will require annual imaging for a total of 5 years.  Return to clinic in 6 months with repeat imaging and further evaluation.  2. Anemia: Patient's hemoglobin remains decreased, but slightly improved to 10.5.  Given her chronic renal insufficiency she may benefit from Procrit in the future. 3. Chronic renal insufficiency: Patient's creatinine appears to be approximately at her baseline.  Appreciate nephrology input.  I provided 15 minutes of non face-to-face telephone visit time during this encounter, and > 50% was spent counseling as documented under my assessment & plan.   Patient expressed understanding and was in agreement with this plan. She also understands that She can call clinic at any time with any questions, concerns, or complaints.   Cancer Staging Renal cancer, left  [facility]) Staging form: Kidney, AJCC 8th Edition - Clinical stage from 01/26/2017: Stage III (cT3a, cNX, cM0) - Signed by Lloyd Huger, MD on 01/26/2017   Lloyd Huger, MD   05/27/2019 7:12 AM

## 2019-05-24 ENCOUNTER — Other Ambulatory Visit: Payer: 59

## 2019-05-24 ENCOUNTER — Ambulatory Visit: Admission: RE | Admit: 2019-05-24 | Payer: 59 | Source: Ambulatory Visit

## 2019-05-24 ENCOUNTER — Ambulatory Visit
Admission: RE | Admit: 2019-05-24 | Discharge: 2019-05-24 | Disposition: A | Discharge status: Home / Self Care | Payer: 59 | Source: Ambulatory Visit | Attending: Urology | Admitting: Urology

## 2019-05-24 ENCOUNTER — Other Ambulatory Visit: Payer: Self-pay

## 2019-05-24 ENCOUNTER — Ambulatory Visit (INDEPENDENT_AMBULATORY_CARE_PROVIDER_SITE_OTHER): Payer: 59

## 2019-05-24 ENCOUNTER — Ambulatory Visit
Admission: RE | Admit: 2019-05-24 | Discharge: 2019-05-24 | Disposition: A | Discharge status: Home / Self Care | Payer: 59 | Source: Ambulatory Visit | Attending: Oncology | Admitting: Oncology

## 2019-05-24 DIAGNOSIS — Z0000 Encounter for general adult medical examination without abnormal findings: Secondary | ICD-10-CM

## 2019-05-24 DIAGNOSIS — Z87891 Personal history of nicotine dependence: Secondary | ICD-10-CM

## 2019-05-24 DIAGNOSIS — Z122 Encounter for screening for malignant neoplasm of respiratory organs: Secondary | ICD-10-CM | POA: Diagnosis present

## 2019-05-24 DIAGNOSIS — C642 Malignant neoplasm of left kidney, except renal pelvis: Secondary | ICD-10-CM | POA: Diagnosis present

## 2019-05-24 DIAGNOSIS — Z780 Asymptomatic menopausal state: Secondary | ICD-10-CM

## 2019-05-24 DIAGNOSIS — N281 Cyst of kidney, acquired: Secondary | ICD-10-CM | POA: Diagnosis present

## 2019-05-24 DIAGNOSIS — Z1239 Encounter for other screening for malignant neoplasm of breast: Secondary | ICD-10-CM

## 2019-05-24 NOTE — Progress Notes (Signed)
Subjective:   Sarah [name] is a 68 y.o. female who presents for Medicare Annual (Subsequent) preventive examination.  This visit is being conducted via phone call  - after an attmept to do on video chat - due to the COVID-19 pandemic. This patient has given me verbal consent via phone to conduct this visit, patient states they are participating from their home address. Some vital signs may be absent or patient reported.   Patient identification: identified by name, DOB, and current address.    Review of Systems:   Cardiac Risk Factors include: advanced age (>72men, >63 women);dyslipidemia;hypertension;smoking/ tobacco exposure     Objective:     Vitals: There were no vitals taken for this visit.  There is no height or weight on file to calculate BMI.  Advanced Directives 05/24/2019 11/10/2018 05/19/2018 10/25/2017 06/22/2017 01/27/2017 01/01/2017  Does Patient Have a Medical Advance Directive? No No No No No No No  Would patient like information on creating a medical advance directive? - No - Patient declined Yes (MAU/Ambulatory/Procedural Areas - Information given) - Yes (ED - Information included in AVS) No - Patient declined No - Patient declined    Tobacco Social History   Tobacco Use  Smoking Status Current Every Day Smoker  . Packs/day: 0.25  . Years: 40.00  . Pack years: 10.00  . Types: Cigarettes  Smokeless Tobacco Never Used     Ready to quit: Yes Counseling given: Yes   Clinical Intake:  Pre-visit preparation completed: Yes  Pain : No/denies pain     Nutritional Risks: None Diabetes: No  How often do you need to have someone help you when you read instructions, pamphlets, or other written materials from your doctor or pharmacy?: 1 - Never  Interpreter Needed?: No  Information entered by ::  ,LPN  Past Medical History:  Diagnosis Date  . Abnormal mammogram, unspecified   . Aneurysm ([place]) 2004   cerebral x  4, [facility]  . Anxiety    . Arthritis   . Benign neoplasm of breast 2012  . Breast complaint 2010  . Chronic kidney disease    Renal Cell Cancer  . Hypertension   . Obesity, unspecified   . Personal history of tobacco use, presenting hazards to health   . Renal cancer, left ([place]) 11/2016   Renal Cell with Left nephrectomy.  Marland Kitchen Special screening for malignant neoplasms, colon    Past Surgical History:  Procedure Laterality Date  . ABDOMINAL HYSTERECTOMY  1993  . BREAST BIOPSY Right 2012   stereo bx showing a 58mm radial scar in an estimated 8 volume of tissue  . BREAST BIOPSY Right Apr 09, 2005   Fibrocystic changes, ductal adenosis, microcalcifications, focal stromal/epithelial proliferation, possible fibroadenoma type proliferation  . CEREBRAL ANEURYSM REPAIR  2004  . COLONOSCOPY  2008   Dr. Nicolasa Ducking  . EYE SURGERY    . FOOT SURGERY Right 2003  . LAPAROSCOPIC NEPHRECTOMY, HAND ASSISTED Left 01/01/2017   Procedure: HAND ASSISTED LAPAROSCOPIC NEPHRECTOMY;  Surgeon: Nickie Retort, MD;  Location: [facility] ORS;  Service: Urology;  Laterality: Left;  . TONSILLECTOMY  1997   Family History  Problem Relation Age of Onset  . Cancer Cousin        maternal first cousin with breast cancer  . Cancer Other        colon and ovarian cancers, no relationship listed  . Cancer Father        prostate  . Hypertension Father   .  Hypertension Sister   . Hypertension Brother   . Hypertension Paternal Grandmother   . Heart disease Paternal Grandmother   . Hypertension Paternal Grandfather   . Heart disease Paternal Grandfather   . Breast cancer Neg Hx    Social History   Socioeconomic History  . Marital status: Divorced    Spouse name: Not on file  . Number of children: Not on file  . Years of education: Not on file  . Highest education level: GED or equivalent  Occupational History    Comment: full time   Social Needs  . Financial resource strain: Not hard at all  . Food insecurity    Worry: Never true     Inability: Never true  . Transportation needs    Medical: No    Non-medical: No  Tobacco Use  . Smoking status: Current Every Day Smoker    Packs/day: 0.25    Years: 40.00    Pack years: 10.00    Types: Cigarettes  . Smokeless tobacco: Never Used  Substance and Sexual Activity  . Alcohol use: Yes    Comment: socially  . Drug use: No  . Sexual activity: Yes  Lifestyle  . Physical activity    Days per week: 0 days    Minutes per session: 0 min  . Stress: Not at all  Relationships  . Social connections    Talks on phone: More than three times a week    Gets together: More than three times a week    Attends religious service: More than 4 times per year    Active member of club or organization: No    Attends meetings of clubs or organizations: Never    Relationship status: Divorced  Other Topics Concern  . Not on file  Social History Narrative  . Not on file    Outpatient Encounter Medications as of 05/24/2019  Medication Sig  . acetaminophen (TYLENOL) 325 MG tablet Take 650 mg by mouth every 6 (six) hours as needed for mild pain, moderate pain or headache.   Marland Kitchen amLODipine (NORVASC) 5 MG tablet Take 1 tablet (5 mg total) by mouth daily.  Marland Kitchen atenolol (TENORMIN) 100 MG tablet Take 1 tablet (100 mg total) by mouth daily.  Marland Kitchen atorvastatin (LIPITOR) 40 MG tablet Take 1 tablet (40 mg total) by mouth daily.  . benazepril-hydrochlorthiazide (LOTENSIN HCT) 20-25 MG tablet Take 1 tablet by mouth daily.  . Cholecalciferol (VITAMIN D-3 PO) Take 600 Units by mouth daily.  . citalopram (CELEXA) 20 MG tablet Take 1 tablet (20 mg total) by mouth daily.  . Multiple Vitamin (MULTIVITAMIN) tablet Take 1 tablet by mouth daily.  . traMADol (ULTRAM) 50 MG tablet Take 1 tablet (50 mg total) by mouth every 12 (twelve) hours as needed.   No facility-administered encounter medications on file as of 05/24/2019.     Activities of Daily Living In your present state of health, do you have any difficulty  performing the following activities: 05/24/2019  Hearing? N  Vision? N  Difficulty concentrating or making decisions? N  Walking or climbing stairs? Y  Dressing or bathing? N  Doing errands, shopping? N  Preparing Food and eating ? N  Using the Toilet? N  In the past six months, have you accidently leaked urine? N  Do you have problems with loss of bowel control? Y  Comment better now since change in diet  Managing your Medications? N  Managing your Finances? N  Housekeeping or managing your Housekeeping?  N  Some recent data might be hidden    Patient Care Team: Venita Lick, NP as PCP - General (Nurse Practitioner) Nickie Retort, MD as Consulting Physician (Urology) Lloyd Huger, MD as Consulting Physician (Oncology)    Assessment:   This is a routine wellness examination for Lavanda.  Exercise Activities and Dietary recommendations Current Exercise Habits: The patient does not participate in regular exercise at present, Exercise limited by: None identified  Goals    . Quit Smoking     Smoking cessation disscussed    . Quit smoking / using tobacco       Fall Risk: Fall Risk  05/24/2019 05/19/2018 06/22/2017 06/09/2016  Falls in the past year? 0 Yes No No  Number falls in past yr: - 1 - -  Injury with Fall? - No - -    FALL RISK PREVENTION PERTAINING TO THE HOME:  Any stairs in or around the home? Yes  If so, are there any without handrails? No   Home free of loose throw rugs in walkways, pet beds, electrical cords, etc? Yes  Adequate lighting in your home to reduce risk of falls? Yes   ASSISTIVE DEVICES UTILIZED TO PREVENT FALLS:  Life alert? No  Use of a cane, walker or w/c? Yes cane use when needed  Grab bars in the bathroom? No  Shower chair or bench in shower? Yes  Elevated toilet seat or a handicapped toilet? No   DME ORDERS:  DME order needed?  No   TIMED UP AND GO:  unable to perform   Depression Screen PHQ 2/9 Scores 05/24/2019  12/28/2018 06/27/2018 05/19/2018  PHQ - 2 Score 0 0 0 0  PHQ- 9 Score - 0 2 -     Cognitive Function     6CIT Screen 05/24/2019 05/19/2018  What Year? 0 points 0 points  What month? 0 points 0 points  What time? 0 points 0 points  Count back from 20 0 points 0 points  Months in reverse 0 points 0 points  Repeat phrase 0 points 0 points  Total Score 0 0    Immunization History  Administered Date(s) Administered  . Influenza, High Dose Seasonal PF 08/30/2017, 08/15/2018  . Influenza-Unspecified 09/04/2015, 09/18/2016, 08/30/2017  . Pneumococcal Conjugate-13 06/22/2017  . Pneumococcal Polysaccharide-23 12/28/2018  . Td 03/31/2006  . Tdap 06/09/2016  . Zoster 01/24/2013    Qualifies for Shingles Vaccine? Yes  Zostavax completed 01/24/2013. Due for Shingrix. Education has been provided regarding the importance of this vaccine. Pt has been advised to call insurance company to determine out of pocket expense. Advised may also receive vaccine at local pharmacy or Health Dept. Verbalized acceptance and understanding.  Tdap: up to date   Flu Vaccine: up to date   Pneumococcal Vaccine: up to date   Screening Tests Health Maintenance  Topic Date Due  . DEXA SCAN  05/05/2017  . INFLUENZA VACCINE  07/01/2019  . MAMMOGRAM  07/20/2020  . COLONOSCOPY  12/04/2024  . TETANUS/TDAP  06/09/2026  . Hepatitis C Screening  Completed  . PNA vac Low Risk Adult  Completed    Cancer Screenings:  Colorectal Screening: Completed 12/04/2014. Repeat every 10 years  Mammogram: Completed 07/20/2018. Repeat every year  Bone Density: ordered  Lung Cancer Screening: (Low Dose CT Chest recommended if Age 73-80 years, 30 pack-year currently smoking OR have quit w/in 15years.) does not qualify.    Additional Screening:   Hepatitis C Screening: does qualify; Completed 06/09/2016  Vision Screening: Recommended annual ophthalmology exams for early detection of glaucoma and other disorders of the eye. Is  the patient up to date with their annual eye exam?  Yes  Who is the provider or what is the name of the office in which the pt attends annual eye exams? Dr.Bell   Dental Screening: Recommended annual dental exams for proper oral hygiene  Community Resource Referral:  CRR required this visit?  No       Plan:  I have personally reviewed and addressed the Medicare Annual Wellness questionnaire and have noted the following in the patient's chart:  A. Medical and social history B. Use of alcohol, tobacco or illicit drugs  C. Current medications and supplements D. Functional ability and status E.  Nutritional status F.  Physical activity G. Advance directives H. List of other physicians I.  Hospitalizations, surgeries, and ER visits in previous 12 months J.  [place] such as hearing and vision if needed, cognitive and depression L. Referrals and appointments   In addition, I have reviewed and discussed with patient certain preventive protocols, quality metrics, and best practice recommendations. A written personalized care plan for preventive services as well as general preventive health recommendations were provided to patient.   Signed,    Bevelyn Ngo, LPN  8/34/1961 Nurse Health Advisor   Nurse Notes: none

## 2019-05-24 NOTE — Patient Instructions (Signed)
Sarah [name] , Thank you for taking time to come for your Medicare Wellness Visit. I appreciate your ongoing commitment to your health goals. Please review the following plan we discussed and let me know if I can assist you in the future.   Screening recommendations/referrals: Colonoscopy: completed 12/14/2014 Mammogram: completed 07/20/2018 Please call [phone] to schedule your mammogram.  Bone Density: Please call [phone].   Recommended yearly ophthalmology/optometry visit for glaucoma screening and checkup Recommended yearly dental visit for hygiene and checkup  Vaccinations: Influenza vaccine: up to date Pneumococcal vaccine: up to date  Tdap vaccine: up to date  Shingles vaccine: shingrix eligible, check with your insurance company for coverage    Advanced directives: please pick up a copy of this information next time you are in the office.   Conditions/risks identified: If you wish to quit smoking, help is available. For free tobacco cessation program offerings call the [facility] at (581) 436-7883 or Live Well Line at (712)792-7810. You may also visit [email or web address] or email [email or web address] for more information on other programs.   Next appointment: follow up in one year for your annual wellness exam.    Preventive Care 67 Years and Older, Female Preventive care refers to lifestyle choices and visits with your health care provider that can promote health and wellness. What does preventive care include?  A yearly physical exam. This is also called an annual well check.  Dental exams once or twice a year.  Routine eye exams. Ask your health care provider how often you should have your eyes checked.  Personal lifestyle choices, including:  Daily care of your teeth and gums.  Regular physical activity.  Eating a healthy diet.  Avoiding tobacco and drug use.  Limiting alcohol use.  Practicing safe sex.  Taking low-dose aspirin  every day.  Taking vitamin and mineral supplements as recommended by your health care provider. What happens during an annual well check? The services and screenings done by your health care provider during your annual well check will depend on your age, overall health, lifestyle risk factors, and family history of disease. Counseling  Your health care provider may ask you questions about your:  Alcohol use.  Tobacco use.  Drug use.  Emotional well-being.  Home and relationship well-being.  Sexual activity.  Eating habits.  History of falls.  Memory and ability to understand (cognition).  Work and work Statistician.  Reproductive health. Screening  You may have the following tests or measurements:  Height, weight, and BMI.  Blood pressure.  Lipid and cholesterol levels. These may be checked every 5 years, or more frequently if you are over 20 years old.  Skin check.  Lung cancer screening. You may have this screening every year starting at age 67 if you have a 30-pack-year history of smoking and currently smoke or have quit within the past 15 years.  Fecal occult blood test (FOBT) of the stool. You may have this test every year starting at age 67.  Flexible sigmoidoscopy or colonoscopy. You may have a sigmoidoscopy every 5 years or a colonoscopy every 10 years starting at age 47.  Hepatitis C blood test.  Hepatitis B blood test.  Sexually transmitted disease (STD) testing.  Diabetes screening. This is done by checking your blood sugar (glucose) after you have not eaten for a while (fasting). You may have this done every 1-3 years.  Bone density scan. This is done to screen for osteoporosis. You may have this done starting at  age 67.  Mammogram. This may be done every 1-2 years. Talk to your health care provider about how often you should have regular mammograms. Talk with your health care provider about your test results, treatment options, and if necessary,  the need for more tests. Vaccines  Your health care provider may recommend certain vaccines, such as:  Influenza vaccine. This is recommended every year.  Tetanus, diphtheria, and acellular pertussis (Tdap, Td) vaccine. You may need a Td booster every 10 years.  Zoster vaccine. You may need this after age 67.  Pneumococcal 13-valent conjugate (PCV13) vaccine. One dose is recommended after age 67.  Pneumococcal polysaccharide (PPSV23) vaccine. One dose is recommended after age 17. Talk to your health care provider about which screenings and vaccines you need and how often you need them. This information is not intended to replace advice given to you by your health care provider. Make sure you discuss any questions you have with your health care provider. Document Released: 12/13/2015 Document Revised: 08/05/2016 Document Reviewed: 09/17/2015 Elsevier Interactive Patient Education  2017 [place] Prevention in the Home Falls can cause injuries. They can happen to people of all ages. There are many things you can do to make your home safe and to help prevent falls. What can I do on the outside of my home?  Regularly fix the edges of walkways and driveways and fix any cracks.  Remove anything that might make you trip as you walk through a door, such as a raised step or threshold.  Trim any bushes or trees on the path to your home.  Use bright outdoor lighting.  Clear any walking paths of anything that might make someone trip, such as rocks or tools.  Regularly check to see if handrails are loose or broken. Make sure that both sides of any steps have handrails.  Any raised decks and porches should have guardrails on the edges.  Have any leaves, snow, or ice cleared regularly.  Use sand or salt on walking paths during winter.  Clean up any spills in your garage right away. This includes oil or grease spills. What can I do in the bathroom?  Use night lights.  Install  grab bars by the toilet and in the tub and shower. Do not use towel bars as grab bars.  Use non-skid mats or decals in the tub or shower.  If you need to sit down in the shower, use a plastic, non-slip stool.  Keep the floor dry. Clean up any water that spills on the floor as soon as it happens.  Remove soap buildup in the tub or shower regularly.  Attach bath mats securely with double-sided non-slip rug tape.  Do not have throw rugs and other things on the floor that can make you trip. What can I do in the bedroom?  Use night lights.  Make sure that you have a light by your bed that is easy to reach.  Do not use any sheets or blankets that are too big for your bed. They should not hang down onto the floor.  Have a firm chair that has side arms. You can use this for support while you get dressed.  Do not have throw rugs and other things on the floor that can make you trip. What can I do in the kitchen?  Clean up any spills right away.  Avoid walking on wet floors.  Keep items that you use a lot in easy-to-reach places.  If you  need to reach something above you, use a strong step stool that has a grab bar.  Keep electrical cords out of the way.  Do not use floor polish or wax that makes floors slippery. If you must use wax, use non-skid floor wax.  Do not have throw rugs and other things on the floor that can make you trip. What can I do with my stairs?  Do not leave any items on the stairs.  Make sure that there are handrails on both sides of the stairs and use them. Fix handrails that are broken or loose. Make sure that handrails are as long as the stairways.  Check any carpeting to make sure that it is firmly attached to the stairs. Fix any carpet that is loose or worn.  Avoid having throw rugs at the top or bottom of the stairs. If you do have throw rugs, attach them to the floor with carpet tape.  Make sure that you have a light switch at the top of the stairs and  the bottom of the stairs. If you do not have them, ask someone to add them for you. What else can I do to help prevent falls?  Wear shoes that:  Do not have high heels.  Have rubber bottoms.  Are comfortable and fit you well.  Are closed at the toe. Do not wear sandals.  If you use a stepladder:  Make sure that it is fully opened. Do not climb a closed stepladder.  Make sure that both sides of the stepladder are locked into place.  Ask someone to hold it for you, if possible.  Clearly mark and make sure that you can see:  Any grab bars or handrails.  First and last steps.  Where the edge of each step is.  Use tools that help you move around (mobility aids) if they are needed. These include:  Canes.  Walkers.  Scooters.  Crutches.  Turn on the lights when you go into a dark area. Replace any light bulbs as soon as they burn out.  Set up your furniture so you have a clear path. Avoid moving your furniture around.  If any of your floors are uneven, fix them.  If there are any pets around you, be aware of where they are.  Review your medicines with your doctor. Some medicines can make you feel dizzy. This can increase your chance of falling. Ask your doctor what other things that you can do to help prevent falls. This information is not intended to replace advice given to you by your health care provider. Make sure you discuss any questions you have with your health care provider. Document Released: 09/12/2009 Document Revised: 04/23/2016 Document Reviewed: 12/21/2014 Elsevier Interactive Patient Education  2017 [employer].

## 2019-05-25 ENCOUNTER — Inpatient Hospital Stay (HOSPITAL_BASED_OUTPATIENT_CLINIC_OR_DEPARTMENT_OTHER): Payer: 59 | Admitting: Oncology

## 2019-05-25 ENCOUNTER — Encounter: Payer: Self-pay | Admitting: *Deleted

## 2019-05-25 ENCOUNTER — Encounter: Payer: Self-pay | Admitting: Oncology

## 2019-05-25 ENCOUNTER — Other Ambulatory Visit: Payer: Self-pay

## 2019-05-25 DIAGNOSIS — Z79899 Other long term (current) drug therapy: Secondary | ICD-10-CM | POA: Diagnosis not present

## 2019-05-25 DIAGNOSIS — I10 Essential (primary) hypertension: Secondary | ICD-10-CM

## 2019-05-25 DIAGNOSIS — F17210 Nicotine dependence, cigarettes, uncomplicated: Secondary | ICD-10-CM

## 2019-05-25 DIAGNOSIS — C642 Malignant neoplasm of left kidney, except renal pelvis: Secondary | ICD-10-CM

## 2019-05-25 NOTE — Progress Notes (Signed)
Patient is wanting her CT Scan results.

## 2019-06-07 ENCOUNTER — Encounter: Payer: Self-pay | Admitting: Urology

## 2019-06-07 ENCOUNTER — Ambulatory Visit (INDEPENDENT_AMBULATORY_CARE_PROVIDER_SITE_OTHER): Payer: 59 | Admitting: Urology

## 2019-06-07 ENCOUNTER — Other Ambulatory Visit: Payer: Self-pay

## 2019-06-07 VITALS — BP 138/66 | HR 68 | Ht 67.0 in | Wt 209.0 lb

## 2019-06-07 DIAGNOSIS — N183 Chronic kidney disease, stage 3 unspecified: Secondary | ICD-10-CM

## 2019-06-07 DIAGNOSIS — C642 Malignant neoplasm of left kidney, except renal pelvis: Secondary | ICD-10-CM | POA: Diagnosis not present

## 2019-06-07 DIAGNOSIS — N281 Cyst of kidney, acquired: Secondary | ICD-10-CM | POA: Diagnosis not present

## 2019-06-07 NOTE — Progress Notes (Signed)
06/07/2019 4:33 PM   Sarah [name] 1951-10-18 [id number]  Referring provider: Kathrine Haddock, NP [address],  [place] [address]   HPI: 68 year old female who presents today for routine kidney cancer follow-up.  She underwent left laparoscopic hand-assisted nephrectomy on 01/01/2017 by Dr. Baruch Gouty.  Surgical pathology c/w multifocal clear cell renal cell carcinoma. 3 distinct tumors were seen. 2 were cystic and the third was solid. There was tumor extension into the renal ssinus. Margins were negative. Lymphovascular invasion was not identified. Histologic grade 2. Greatest dimension was 2.1 cm. Stage pT3a.  She follows up today with routine imaging.  Most recent cross-sectional imaging the form of CT abdomen pelvis without contrast on 05/24/2019 shows no evidence of metastatic disease.  She does have multiple left-sided renal cyst.  She also had a renal ultrasound on 05/25/2019 to follow her left-sided renal cyst.  This shows multiple cysts of minimal complexity measuring up to 4.7 cm.  There is no solid or more complex lesions.  She is also followed with Dr. Grayland Ormond (oncology) for routine surveillance.  No flank pain or gross hematuria.    Cr stable 1.54 as of 03/2019.  GFR 40 .  Followed by nephrology.  Advised to no longer receive IV contrast.   PMH: Past Medical History:  Diagnosis Date  . Abnormal mammogram, unspecified   . Aneurysm ([place]) 2004   cerebral x  4, [facility]  . Anxiety   . Arthritis   . Benign neoplasm of breast 2012  . Breast complaint 2010  . Chronic kidney disease    Renal Cell Cancer  . Hypertension   . Obesity, unspecified   . Personal history of tobacco use, presenting hazards to health   . Renal cancer, left ([place]) 11/2016   Renal Cell with Left nephrectomy.  Mar[facility] Kitchen Special screening for malignant neoplasms, colon     Surgical History: Past Surgical History:  Procedure Laterality Date  . ABDOMINAL HYSTERECTOMY  1993  . BREAST BIOPSY  Right 2012   stereo bx showing a 33mm radial scar in an estimated 8 volume of tissue  . BREAST BIOPSY Right Apr 09, 2005   Fibrocystic changes, ductal adenosis, microcalcifications, focal stromal/epithelial proliferation, possible fibroadenoma type proliferation  . CEREBRAL ANEURYSM REPAIR  2004  . COLONOSCOPY  2008   Dr. Nicolasa Ducking  . EYE SURGERY    . FOOT SURGERY Right 2003  . LAPAROSCOPIC NEPHRECTOMY, HAND ASSISTED Left 01/01/2017   Procedure: HAND ASSISTED LAPAROSCOPIC NEPHRECTOMY;  Surgeon: Nickie Retort, MD;  Location: [facility] ORS;  Service: Urology;  Laterality: Left;  . TONSILLECTOMY  1997    Home Medications:  Allergies as of 06/07/2019      Reactions   Codeine Itching      Medication List       Accurate as of June 07, 2019 11:59 PM. If you have any questions, ask your nurse or doctor.        acetaminophen 325 MG tablet Commonly known as: TYLENOL Take 650 mg by mouth every 6 (six) hours as needed for mild pain, moderate pain or headache.   amLODipine 5 MG tablet Commonly known as: NORVASC Take 1 tablet (5 mg total) by mouth daily.   atenolol 100 MG tablet Commonly known as: TENORMIN Take 1 tablet (100 mg total) by mouth daily.   atorvastatin 40 MG tablet Commonly known as: LIPITOR Take 1 tablet (40 mg total) by mouth daily.   benazepril-hydrochlorthiazide 20-25 MG tablet Commonly known as: LOTENSIN HCT Take  1 tablet by mouth daily.   citalopram 20 MG tablet Commonly known as: CELEXA Take 1 tablet (20 mg total) by mouth daily.   multivitamin tablet Take 1 tablet by mouth daily.   traMADol 50 MG tablet Commonly known as: ULTRAM Take 1 tablet (50 mg total) by mouth every 12 (twelve) hours as needed.   VITAMIN D-3 PO Take 600 Units by mouth daily.       Allergies:  Allergies  Allergen Reactions  . Codeine Itching    Family History: Family History  Problem Relation Age of Onset  . Cancer Cousin        maternal first cousin with breast cancer  .  Cancer Other        colon and ovarian cancers, no relationship listed  . Cancer Father        prostate  . Hypertension Father   . Hypertension Sister   . Hypertension Brother   . Hypertension Paternal Grandmother   . Heart disease Paternal Grandmother   . Hypertension Paternal Grandfather   . Heart disease Paternal Grandfather   . Breast cancer Neg Hx     Social History:  reports that she has been smoking cigarettes. She has a 10.00 pack-year smoking history. She has never used smokeless tobacco. She reports current alcohol use. She reports that she does not use drugs.  ROS: UROLOGY Frequent Urination?: No Hard to postpone urination?: No Burning/pain with urination?: No Get up at night to urinate?: No Leakage of urine?: No Urine stream starts and stops?: No Trouble starting stream?: No Do you have to strain to urinate?: No Blood in urine?: No Urinary tract infection?: No Sexually transmitted disease?: No Injury to kidneys or bladder?: No Painful intercourse?: No Weak stream?: No Currently pregnant?: No Vaginal bleeding?: No Last menstrual period?: N  Gastrointestinal Nausea?: No Vomiting?: No Indigestion/heartburn?: No Diarrhea?: No Constipation?: No  Constitutional Fever: No Night sweats?: No Weight loss?: No Fatigue?: No  Skin Skin rash/lesions?: No Itching?: No  Eyes Blurred vision?: No Double vision?: No  Ears/Nose/Throat Sore throat?: No Sinus problems?: No  Hematologic/Lymphatic Swollen glands?: No Easy bruising?: No  Cardiovascular Leg swelling?: No Chest pain?: No  Respiratory Cough?: No Shortness of breath?: No  Endocrine Excessive thirst?: No  Musculoskeletal Back pain?: No Joint pain?: No  Neurological Headaches?: No Dizziness?: No  Psychologic Depression?: No Anxiety?: No  Physical Exam: BP 138/66   Pulse 68   Ht 5\' 7"  (1.702 m)   Wt 209 lb (94.8 kg)   BMI 32.73 kg/m   Constitutional:  Alert and oriented, No  acute distress. HEENT: [place] AT, moist mucus membranes.  Trachea midline, no masses. Cardiovascular: No clubbing, cyanosis, or edema. Respiratory: Normal respiratory effort, no increased work of breathing. Skin: No rashes, bruises or suspicious lesions. Neurologic: Grossly intact, no focal deficits, moving all 4 extremities. Psychiatric: Normal mood and affect.  Laboratory Data: Lab Results  Component Value Date   WBC 7.0 05/17/2019   HGB 10.5 (L) 05/17/2019   HCT 32.1 (L) 05/17/2019   MCV 89.7 05/17/2019   PLT 268 05/17/2019    Lab Results  Component Value Date   CREATININE 1.54 (H) 05/17/2019    Urinalysis    Component Value Date/Time   APPEARANCEUR Clear 06/22/2017 0000   GLUCOSEU Negative 06/22/2017 0000   BILIRUBINUR Negative 06/22/2017 0000   PROTEINUR Negative 06/22/2017 0000   NITRITE Negative 06/22/2017 0000   LEUKOCYTESUR Negative 06/22/2017 0000    Lab Results  Component Value Date  LABMICR See below: 06/22/2017   WBCUA 0-5 06/22/2017   RBCUA None seen 06/22/2017   LABEPIT 0-10 06/22/2017   MUCUS None seen 06/22/2017   BACTERIA None seen 06/22/2017    Pertinent Imaging: Results for orders placed during the hospital encounter of 05/24/19  US RENAL   Narrative CLINICAL DATA:  Complex right renal cysts, remote left nephrectomy  EXAM: RENAL / URINARY TRACT ULTRASOUND COMPLETE  COMPARISON:  05/24/2019, 11/04/2018  FINDINGS: Right Kidney:  Renal measurements: 11.5 x 5.5 x 5.1 cm = volume: 170 mL. Normal echogenicity and cortical thickness. No acute finding or hydronephrosis. Innumerable anechoic simple and minimally complex cysts noted throughout the right kidney. Largest cyst in the right kidney upper pole measures 4.7 x 3.6 x 4.0 cm and correlates with a hyperdense lesion by noncontrast CT. Largest lower pole cyst measures 2.1 x 2.1 x 2.1 cm and also correlates with a hyperdense lesion by CT. These are favored to represent proteinaceous or  hemorrhagic cysts. No solid right renal lesion demonstrated by ultrasound.  Left Kidney:  Surgically absent.  Bladder:  Appears normal for degree of bladder distention.  IMPRESSION: Numerous right renal simple and minimally complex cysts measuring up to 4.7 cm in the upper pole. No definite solid right renal mass.  No acute finding or hydronephrosis.   Electronically Signed   By: Jerilynn Mages.  Shick M.D.   On: 05/25/2019 09:17    Renal ultrasound imaging was personally reviewed today.  Agree with radiologic interpretation that these cysts appear to be benign, Bosniak 1 and 2.  Assessment & Plan:    1. Renal cell carcinoma of left kidney (HCC) No evidence of disease on most recent cross-sectional imaging Based on her pathology, she should be surveyed every 6 months for 3 years and then less frequently thereafter She is currently followed by Dr. Grayland Ormond as well for the same purpose thus will defer imaging for history of renal cell carcinoma to him  2. Acquired renal cyst of right kidney Minimally complex Bosniak 2 right-sided renal mass Given her history of renal cell malignancy and a solitary kidney, I recommended we continue to follow this with serial ultrasound annually given her inability to receive IV contrast She is agreeable this plan Follow-up in 1 year with renal ultrasound  3. CKD (chronic kidney disease) stage 3, GFR 30-59 ml/min (HCC) Creatinine stable, solitary kidney precautions reviewed again today   Return in about 1 year (around 06/06/2020) for RUS.  Hollice Espy, MD   [facility] [address], [place], [place] [address] [phone]

## 2019-06-28 ENCOUNTER — Other Ambulatory Visit: Payer: Self-pay

## 2019-06-28 ENCOUNTER — Encounter: Payer: Self-pay | Admitting: Nurse Practitioner

## 2019-06-28 ENCOUNTER — Ambulatory Visit (INDEPENDENT_AMBULATORY_CARE_PROVIDER_SITE_OTHER): Payer: 59 | Admitting: Nurse Practitioner

## 2019-06-28 VITALS — BP 136/83 | HR 61 | Temp 98.4°F

## 2019-06-28 DIAGNOSIS — Z6835 Body mass index (BMI) 35.0-35.9, adult: Secondary | ICD-10-CM

## 2019-06-28 DIAGNOSIS — Z20822 Contact with and (suspected) exposure to covid-19: Secondary | ICD-10-CM

## 2019-06-28 DIAGNOSIS — N183 Chronic kidney disease, stage 3 unspecified: Secondary | ICD-10-CM

## 2019-06-28 DIAGNOSIS — I1310 Hypertensive heart and chronic kidney disease without heart failure, with stage 1 through stage 4 chronic kidney disease, or unspecified chronic kidney disease: Secondary | ICD-10-CM | POA: Diagnosis not present

## 2019-06-28 DIAGNOSIS — Z20828 Contact with and (suspected) exposure to other viral communicable diseases: Secondary | ICD-10-CM | POA: Diagnosis not present

## 2019-06-28 DIAGNOSIS — E66812 Obesity, class 2: Secondary | ICD-10-CM

## 2019-06-28 DIAGNOSIS — E7800 Pure hypercholesterolemia, unspecified: Secondary | ICD-10-CM

## 2019-06-28 NOTE — Patient Instructions (Signed)
Cholesterol Content in Foods Cholesterol is a waxy, fat-like substance that helps to carry fat in the blood. The body needs cholesterol in small amounts, but too much cholesterol can cause damage to the arteries and heart. Most people should eat less than 200 milligrams (mg) of cholesterol a day. Foods with cholesterol  Cholesterol is found in animal-based foods, such as meat, seafood, and dairy. Generally, low-fat dairy and lean meats have less cholesterol than full-fat dairy and fatty meats. The milligrams of cholesterol per serving (mg per serving) of common cholesterol-containing foods are listed below. Meat and other proteins  Egg - one large whole egg has 186 mg.  Veal shank - 4 oz has 141 mg.  Lean ground turkey (93% lean) - 4 oz has 118 mg.  Fat-trimmed lamb loin - 4 oz has 106 mg.  Lean ground beef (90% lean) - 4 oz has 100 mg.  Lobster - 3.5 oz has 90 mg.  Pork loin chops - 4 oz has 86 mg.  Canned salmon - 3.5 oz has 83 mg.  Fat-trimmed beef top loin - 4 oz has 78 mg.  Frankfurter - 1 frank (3.5 oz) has 77 mg.  Crab - 3.5 oz has 71 mg.  Roasted chicken without skin, white meat - 4 oz has 66 mg.  Light bologna - 2 oz has 45 mg.  Deli-cut turkey - 2 oz has 31 mg.  Canned tuna - 3.5 oz has 31 mg.  Bacon - 1 oz has 29 mg.  Oysters and mussels (raw) - 3.5 oz has 25 mg.  Mackerel - 1 oz has 22 mg.  Trout - 1 oz has 20 mg.  Pork sausage - 1 link (1 oz) has 17 mg.  Salmon - 1 oz has 16 mg.  Tilapia - 1 oz has 14 mg. Dairy  Soft-serve ice cream -  cup (4 oz) has 103 mg.  Whole-milk yogurt - 1 cup (8 oz) has 29 mg.  Cheddar cheese - 1 oz has 28 mg.  American cheese - 1 oz has 28 mg.  Whole milk - 1 cup (8 oz) has 23 mg.  2% milk - 1 cup (8 oz) has 18 mg.  Cream cheese - 1 tablespoon (Tbsp) has 15 mg.  Cottage cheese -  cup (4 oz) has 14 mg.  Low-fat (1%) milk - 1 cup (8 oz) has 10 mg.  Sour cream - 1 Tbsp has 8.5 mg.  Low-fat yogurt - 1 cup  (8 oz) has 8 mg.  Nonfat Greek yogurt - 1 cup (8 oz) has 7 mg.  Half-and-half cream - 1 Tbsp has 5 mg. Fats and oils  Cod liver oil - 1 tablespoon (Tbsp) has 82 mg.  Butter - 1 Tbsp has 15 mg.  Lard - 1 Tbsp has 14 mg.  Bacon grease - 1 Tbsp has 14 mg.  Mayonnaise - 1 Tbsp has 5-10 mg.  Margarine - 1 Tbsp has 3-10 mg. Exact amounts of cholesterol in these foods may vary depending on specific ingredients and brands. Foods without cholesterol Most plant-based foods do not have cholesterol unless you combine them with a food that has cholesterol. Foods without cholesterol include:  Grains and cereals.  Vegetables.  Fruits.  Vegetable oils, such as olive, canola, and sunflower oil.  Legumes, such as peas, beans, and lentils.  Nuts and seeds.  Egg whites. Summary  The body needs cholesterol in small amounts, but too much cholesterol can cause damage to the arteries and heart.    Most people should eat less than 200 milligrams (mg) of cholesterol a day. This information is not intended to replace advice given to you by your health care provider. Make sure you discuss any questions you have with your health care provider. Document Released: 07/13/2017 Document Revised: 10/29/2017 Document Reviewed: 07/13/2017 Elsevier Patient Education  2020 Elsevier Inc.  

## 2019-06-28 NOTE — Assessment & Plan Note (Signed)
Chronic, ongoing and followed by nephrology. BP initially elevated today, but repeat at goal. Continue current medication regimen and adjust as needed + collaboration with nephrology and CA team.

## 2019-06-28 NOTE — Assessment & Plan Note (Signed)
Chronic, ongoing.  Continue current medication regimen and adjust as needed. Lipid panel today. 

## 2019-06-28 NOTE — Assessment & Plan Note (Signed)
Recommend continued focus on health diet choices and regular physical activity (30 minutes 5 days a week). 

## 2019-06-28 NOTE — Progress Notes (Signed)
BP 136/83 (BP Location: Left Arm, Cuff Size: Large)   Pulse 61   Temp 98.4 F (36.9 C) (Oral)   SpO2 99%    Subjective:    Patient ID: Sarah [name], female    DOB: [birth year], 68 y.o.   MRN: [id number]  HPI: Sarah [name] is a 68 y.o. female  Chief Complaint  Patient presents with  . Hyperlipidemia  . Hypertension   HYPERTENSION / HYPERLIPIDEMIA  Continues on Amlodipine, Atenolol, Benazepril-HCTZ. Recent labs 05/17/2019 with CRT 1.54 and GFR 40, she is followed by Dr. Candiss Norse.  Continues on Atorvastatin.   Satisfied with current treatment? yes Duration of hypertension: chronic BP monitoring frequency: not checking, machine not working BP range: not checking BP medication side effects: no Duration of hyperlipidemia: chronic Cholesterol medication side effects: no Cholesterol supplements: none Medication compliance: good compliance Aspirin: no Recent stressors: no Recurrent headaches: no Visual changes: no Palpitations: no Dyspnea: no Chest pain: no Lower extremity edema: yes, occasional Dizzy/lightheaded: no   COVID TESTING: Patient is requesting outpatient Covid testing due to her risk factors and current job.  Relevant past medical, surgical, family and social history reviewed and updated as indicated. Interim medical history since our last visit reviewed. Allergies and medications reviewed and updated.  Review of Systems  Constitutional: Negative for activity change, appetite change, diaphoresis, fatigue and fever.  Respiratory: Negative for cough, chest tightness and shortness of breath.   Cardiovascular: Negative for chest pain, palpitations and leg swelling.  Gastrointestinal: Negative for abdominal distention, abdominal pain, constipation, diarrhea, nausea and vomiting.  Neurological: Negative for dizziness, syncope, weakness, light-headedness, numbness and headaches.  Psychiatric/Behavioral: Negative.     Per HPI unless specifically indicated above      Objective:    BP 136/83 (BP Location: Left Arm, Cuff Size: Large)   Pulse 61   Temp 98.4 F (36.9 C) (Oral)   SpO2 99%   Wt Readings from Last 3 Encounters:  06/07/19 209 lb (94.8 kg)  05/24/19 210 lb (95.3 kg)  12/28/18 214 lb (97.1 kg)    Physical Exam Vitals signs and nursing note reviewed.  Constitutional:      General: She is awake. She is not in acute distress.    Appearance: She is well-developed. She is obese. She is not ill-appearing.  HENT:     Head: Normocephalic.     Right Ear: Hearing normal.     Left Ear: Hearing normal.     Nose: Nose normal.     Mouth/Throat:     Mouth: Mucous membranes are moist.  Eyes:     General: Lids are normal.        Right eye: No discharge.        Left eye: No discharge.     Conjunctiva/sclera: Conjunctivae normal.     Pupils: Pupils are equal, round, and reactive to light.  Neck:     Musculoskeletal: Normal range of motion and neck supple.     Vascular: No carotid bruit.  Cardiovascular:     Rate and Rhythm: Normal rate and regular rhythm.     Heart sounds: Normal heart sounds. No murmur. No gallop.   Pulmonary:     Effort: Pulmonary effort is normal.     Breath sounds: Normal breath sounds.  Abdominal:     General: Bowel sounds are normal.     Palpations: Abdomen is soft.  Musculoskeletal:     Right lower leg: No edema.     Left lower leg: No  edema.  Skin:    General: Skin is warm and dry.  Neurological:     Mental Status: She is alert and oriented to person, place, and time.  Psychiatric:        Attention and Perception: Attention normal.        Mood and Affect: Mood normal.        Behavior: Behavior normal. Behavior is cooperative.        Thought Content: Thought content normal.        Judgment: Judgment normal.     Results for orders placed or performed in visit on 05/17/19  CBC with Differential/Platelet  Result Value Ref Range   WBC 7.0 4.0 - 10.5 K/uL   RBC 3.58 (L) 3.87 - 5.11 MIL/uL   Hemoglobin  10.5 (L) 12.0 - 15.0 g/dL   HCT 32.1 (L) 36.0 - 46.0 %   MCV 89.7 80.0 - 100.0 fL   MCH 29.3 26.0 - 34.0 pg   MCHC 32.7 30.0 - 36.0 g/dL   RDW 14.6 11.5 - 15.5 %   Platelets 268 150 - 400 K/uL   nRBC 0.0 0.0 - 0.2 %   Neutrophils Relative % 52 %   Neutro Abs 3.6 1.7 - 7.7 K/uL   Lymphocytes Relative 38 %   Lymphs Abs 2.7 0.7 - 4.0 K/uL   Monocytes Relative 8 %   Monocytes Absolute 0.6 0.1 - 1.0 K/uL   Eosinophils Relative 1 %   Eosinophils Absolute 0.1 0.0 - 0.5 K/uL   Basophils Relative 1 %   Basophils Absolute 0.0 0.0 - 0.1 K/uL   Immature Granulocytes 0 %   Abs Immature Granulocytes 0.01 0.00 - 0.07 K/uL  Comprehensive metabolic panel  Result Value Ref Range   Sodium 141 135 - 145 mmol/L   Potassium 4.1 3.5 - 5.1 mmol/L   Chloride 108 98 - 111 mmol/L   CO2 25 22 - 32 mmol/L   Glucose, Bld 91 70 - 99 mg/dL   BUN 30 (H) 8 - 23 mg/dL   Creatinine, Ser 1.54 (H) 0.44 - 1.00 mg/dL   Calcium 9.4 8.9 - 10.3 mg/dL   Total Protein 7.4 6.5 - 8.1 g/dL   Albumin 3.8 3.5 - 5.0 g/dL   AST 16 15 - 41 U/L   ALT 13 0 - 44 U/L   Alkaline Phosphatase 70 38 - 126 U/L   Total Bilirubin 0.2 (L) 0.3 - 1.2 mg/dL   GFR calc non Af Amer 35 (L) >60 mL/min   GFR calc Af Amer 40 (L) >60 mL/min   Anion gap 8 5 - 15      Assessment & Plan:   Problem List Items Addressed This Visit      Cardiovascular and Mediastinum   Hypertensive heart/kidney disease without HF and with CKD stage III (HCC)    Chronic, ongoing and followed by nephrology. BP initially elevated today, but repeat at goal. Continue current medication regimen and adjust as needed + collaboration with nephrology and CA team.          Other   Hyperlipidemia    Chronic, ongoing.  Continue current medication regimen and adjust as needed.  Lipid panel today.        Relevant Orders   Lipid Panel w/o Chol/HDL Ratio   Obesity - Primary    Recommend continued focus on health diet choices and regular physical activity (30 minutes 5  days a week).       Other Visit Diagnoses  Exposure to Covid-19 Virus       Requesting Covid nasal swab due to her risk factors with cancer and many outpatient appointments.  No recent symptoms, known exposure, or concerns.   Relevant Orders   Novel Coronavirus, NAA ([facility])       Follow up plan: Return in about 6 months (around 12/29/2019) for HTN/HLD, Cancer, Mood.

## 2019-06-29 LAB — LIPID PANEL W/O CHOL/HDL RATIO
Cholesterol, Total: 123 mg/dL (ref 100–199)
HDL: 46 mg/dL (ref >39)
LDL Calculated: 66 mg/dL (ref 0–99)
Triglycerides: 54 mg/dL (ref 0–149)
VLDL Cholesterol Cal: 11 mg/dL (ref 5–40)

## 2019-07-24 ENCOUNTER — Inpatient Hospital Stay: Admission: RE | Admit: 2019-07-24 | Payer: 59 | Source: Ambulatory Visit

## 2019-07-24 ENCOUNTER — Other Ambulatory Visit: Payer: 59

## 2019-08-08 ENCOUNTER — Other Ambulatory Visit: Payer: Self-pay | Admitting: Nurse Practitioner

## 2019-08-14 ENCOUNTER — Other Ambulatory Visit: Payer: Self-pay | Admitting: Nurse Practitioner

## 2019-08-30 ENCOUNTER — Other Ambulatory Visit: Payer: Self-pay | Admitting: Nurse Practitioner

## 2019-08-30 ENCOUNTER — Ambulatory Visit
Admission: RE | Admit: 2019-08-30 | Discharge: 2019-08-30 | Disposition: A | Discharge status: Home / Self Care | Payer: 59 | Source: Ambulatory Visit | Attending: Nurse Practitioner | Admitting: Nurse Practitioner

## 2019-08-30 ENCOUNTER — Other Ambulatory Visit: Payer: Self-pay

## 2019-08-30 DIAGNOSIS — R928 Other abnormal and inconclusive findings on diagnostic imaging of breast: Secondary | ICD-10-CM | POA: Diagnosis not present

## 2019-08-30 DIAGNOSIS — Z20822 Contact with and (suspected) exposure to covid-19: Secondary | ICD-10-CM

## 2019-08-30 DIAGNOSIS — N6310 Unspecified lump in the right breast, unspecified quadrant: Secondary | ICD-10-CM

## 2019-08-30 DIAGNOSIS — Z1231 Encounter for screening mammogram for malignant neoplasm of breast: Secondary | ICD-10-CM | POA: Insufficient documentation

## 2019-08-30 DIAGNOSIS — Z1239 Encounter for other screening for malignant neoplasm of breast: Secondary | ICD-10-CM | POA: Diagnosis present

## 2019-08-30 DIAGNOSIS — Z780 Asymptomatic menopausal state: Secondary | ICD-10-CM

## 2019-08-31 LAB — NOVEL CORONAVIRUS, NAA: SARS-CoV-2, NAA: NOT DETECTED

## 2019-09-07 ENCOUNTER — Ambulatory Visit
Admission: RE | Admit: 2019-09-07 | Discharge: 2019-09-07 | Disposition: A | Discharge status: Home / Self Care | Payer: 59 | Source: Ambulatory Visit | Attending: Nurse Practitioner | Admitting: Nurse Practitioner

## 2019-09-07 DIAGNOSIS — N6310 Unspecified lump in the right breast, unspecified quadrant: Secondary | ICD-10-CM | POA: Diagnosis present

## 2019-09-07 DIAGNOSIS — R928 Other abnormal and inconclusive findings on diagnostic imaging of breast: Secondary | ICD-10-CM | POA: Diagnosis not present

## 2019-09-11 ENCOUNTER — Other Ambulatory Visit: Payer: Self-pay | Admitting: Nurse Practitioner

## 2019-09-11 ENCOUNTER — Other Ambulatory Visit: Payer: Self-pay | Admitting: Unknown Physician Specialty

## 2019-09-14 ENCOUNTER — Ambulatory Visit (INDEPENDENT_AMBULATORY_CARE_PROVIDER_SITE_OTHER): Payer: 59

## 2019-09-14 ENCOUNTER — Other Ambulatory Visit: Payer: Self-pay

## 2019-09-14 DIAGNOSIS — Z23 Encounter for immunization: Secondary | ICD-10-CM

## 2019-09-25 ENCOUNTER — Other Ambulatory Visit: Payer: Self-pay | Admitting: Unknown Physician Specialty

## 2019-09-26 NOTE — Telephone Encounter (Signed)
Requested medication (s) are due for refill today: yes  Requested medication (s) are on the active medication list: yes  Last refill:  11/25/2018  Future visit scheduled: yes  Notes to clinic: refill cannot be delegated    Requested Prescriptions  Pending Prescriptions Disp Refills   traMADol (ULTRAM) 50 MG tablet [Pharmacy Med Name: TRAMADOL HCL 50 MG TABLET] 14 tablet     Sig: Take 1 tablet (50 mg total) by mouth every 12 (twelve) hours as needed.     Not Delegated - Analgesics:  Opioid Agonists Failed - 09/25/2019  6:37 PM      Failed - This refill cannot be delegated      Failed - Urine Drug Screen completed in last 360 days.      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

## 2019-09-26 NOTE — Telephone Encounter (Signed)
Will refill 14 tablets only for now, but if back pain present I would like to see her in office for further refills.

## 2019-10-03 DIAGNOSIS — I10 Essential (primary) hypertension: Secondary | ICD-10-CM | POA: Insufficient documentation

## 2019-10-04 DIAGNOSIS — I700 Atherosclerosis of aorta: Secondary | ICD-10-CM | POA: Insufficient documentation

## 2019-10-04 DIAGNOSIS — D631 Anemia in chronic kidney disease: Secondary | ICD-10-CM | POA: Insufficient documentation

## 2019-10-04 DIAGNOSIS — J439 Emphysema, unspecified: Secondary | ICD-10-CM | POA: Insufficient documentation

## 2019-10-04 DIAGNOSIS — I2510 Atherosclerotic heart disease of native coronary artery without angina pectoris: Secondary | ICD-10-CM | POA: Insufficient documentation

## 2019-10-04 DIAGNOSIS — Q6102 Congenital multiple renal cysts: Secondary | ICD-10-CM | POA: Insufficient documentation

## 2019-12-14 ENCOUNTER — Other Ambulatory Visit: Payer: Self-pay | Admitting: Emergency Medicine

## 2019-12-14 ENCOUNTER — Other Ambulatory Visit: Payer: Self-pay | Admitting: Oncology

## 2019-12-14 DIAGNOSIS — C642 Malignant neoplasm of left kidney, except renal pelvis: Secondary | ICD-10-CM

## 2019-12-15 ENCOUNTER — Ambulatory Visit: Admission: RE | Admit: 2019-12-15 | Payer: 59 | Source: Ambulatory Visit

## 2019-12-15 NOTE — Progress Notes (Deleted)
[place]  [phone] [phone]) [phone]  ID: [name] OB: 1951/07/09  MR#: [id number]  EYC#:[id number]  Patient Care Team: Venita Lick, NP as PCP - General (Nurse Practitioner) Nickie Retort, MD as Consulting Physician (Urology) Lloyd Huger, MD as Consulting Physician (Oncology)  I connected with [name] on 12/15/19 at 10:45 AM EST by telephone visit and verified that I am speaking with the correct person using two identifiers.   I discussed the limitations, risks, security and privacy concerns of performing an evaluation and management service by telemedicine and the availability of in-person appointments. I also discussed with the patient that there may be a patient responsible charge related to this service. The patient expressed understanding and agreed to proceed.   Other persons participating in the visit and their role in the encounter: Patient, MD  Patient's location: Home Provider's location: Clinic  CHIEF COMPLAINT: Stage III renal cell carcinoma, left.  INTERVAL HISTORY: Patient agreed to evaluation and discussion of her imaging results via telephone.  She continues to feel well and remains asymptomatic. She has no neurologic complaints. She denies any recent fevers or illnesses. She has a good appetite and denies weight loss.  She denies any chest pain, shortness of breath, cough, or hemoptysis.  She has no abdominal pain.  She denies any nausea, vomiting, constipation, or diarrhea. She has no urinary complaints.  Patient feels at her baseline offers no specific complaints today.  REVIEW OF SYSTEMS:   Review of Systems  Constitutional: Negative.  Negative for fever, malaise/fatigue and weight loss.  Respiratory: Negative.  Negative for cough and shortness of breath.   Cardiovascular: Negative.  Negative for chest pain and leg swelling.  Gastrointestinal: Negative.  Negative for abdominal pain.    Genitourinary: Negative.  Negative for flank pain and hematuria.  Musculoskeletal: Negative.  Negative for back pain.  Skin: Negative.  Negative for rash.  Neurological: Negative.  Negative for sensory change, focal weakness and weakness.  Psychiatric/Behavioral: Negative.  The patient is not nervous/anxious.     As per HPI. Otherwise, a complete review of systems is negative.  PAST MEDICAL HISTORY: Past Medical History:  Diagnosis Date  . Abnormal mammogram, unspecified   . Aneurysm ([place]) 2004   cerebral x  4, [facility]  . Anxiety   . Arthritis   . Benign neoplasm of breast 2012  . Breast complaint 2010  . Chronic kidney disease    Renal Cell Cancer  . Hypertension   . Obesity, unspecified   . Personal history of tobacco use, presenting hazards to health   . Renal cancer, left ([place]) 11/2016   Renal Cell with Left nephrectomy.  Marland Kitchen Special screening for malignant neoplasms, colon     PAST SURGICAL HISTORY: Past Surgical History:  Procedure Laterality Date  . ABDOMINAL HYSTERECTOMY  1993  . BREAST BIOPSY Right 2012   stereo bx showing a 71mm radial scar in an estimated 8 volume of tissue  . BREAST BIOPSY Right Apr 09, 2005   Fibrocystic changes, ductal adenosis, microcalcifications, focal stromal/epithelial proliferation, possible fibroadenoma type proliferation  . CEREBRAL ANEURYSM REPAIR  2004  . COLONOSCOPY  2008   Dr. Nicolasa Ducking  . EYE SURGERY    . FOOT SURGERY Right 2003  . LAPAROSCOPIC NEPHRECTOMY, HAND ASSISTED Left 01/01/2017   Procedure: HAND ASSISTED LAPAROSCOPIC NEPHRECTOMY;  Surgeon: Nickie Retort, MD;  Location: [facility] ORS;  Service: Urology;  Laterality: Left;  . TONSILLECTOMY  1997  FAMILY HISTORY: Family History  Problem Relation Age of Onset  . Cancer Cousin        maternal first cousin with breast cancer  . Cancer Other        colon and ovarian cancers, no relationship listed  . Cancer Father        prostate  . Hypertension Father   .  Hypertension Sister   . Hypertension Brother   . Hypertension Paternal Grandmother   . Heart disease Paternal Grandmother   . Hypertension Paternal Grandfather   . Heart disease Paternal Grandfather   . Breast cancer Neg Hx     ADVANCED DIRECTIVES (Y/N):  N  HEALTH MAINTENANCE: Social History   Tobacco Use  . Smoking status: Current Every Day Smoker    Packs/day: 0.25    Years: 40.00    Pack years: 10.00    Types: Cigarettes  . Smokeless tobacco: Never Used  Substance Use Topics  . Alcohol use: Yes    Comment: socially  . Drug use: No     Colonoscopy:  PAP:  Bone density:  Lipid panel:  Allergies  Allergen Reactions  . Codeine Itching    Current Outpatient Medications  Medication Sig Dispense Refill  . acetaminophen (TYLENOL) 325 MG tablet Take 650 mg by mouth every 6 (six) hours as needed for mild pain, moderate pain or headache.     Marland Kitchen amLODipine (NORVASC) 5 MG tablet TAKE 1 TABLET BY MOUTH EVERY DAY 90 tablet 1  . atenolol (TENORMIN) 100 MG tablet TAKE 1 TABLET BY MOUTH EVERY DAY 90 tablet 1  . atorvastatin (LIPITOR) 40 MG tablet TAKE 1 TABLET BY MOUTH EVERY DAY 90 tablet 3  . benazepril-hydrochlorthiazide (LOTENSIN HCT) 20-25 MG tablet Take 1 tablet by mouth daily. 90 tablet 3  . Cholecalciferol (VITAMIN D-3 PO) Take 600 Units by mouth daily.    . citalopram (CELEXA) 20 MG tablet TAKE 1 TABLET BY MOUTH EVERY DAY 90 tablet 1  . Multiple Vitamin (MULTIVITAMIN) tablet Take 1 tablet by mouth daily.    . traMADol (ULTRAM) 50 MG tablet TAKE 1 TABLET (50 MG TOTAL) BY MOUTH EVERY 12 (TWELVE) HOURS AS NEEDED. 14 tablet 0   No current facility-administered medications for this visit.    OBJECTIVE: There were no vitals filed for this visit.   There is no height or weight on file to calculate BMI.    ECOG FS:0 - Asymptomatic   LAB RESULTS:  Lab Results  Component Value Date   NA 141 05/17/2019   K 4.1 05/17/2019   CL 108 05/17/2019   CO2 25 05/17/2019   GLUCOSE  91 05/17/2019   BUN 30 (H) 05/17/2019   CREATININE 1.54 (H) 05/17/2019   CALCIUM 9.4 05/17/2019   PROT 7.4 05/17/2019   ALBUMIN 3.8 05/17/2019   AST 16 05/17/2019   ALT 13 05/17/2019   ALKPHOS 70 05/17/2019   BILITOT 0.2 (L) 05/17/2019   GFRNONAA 35 (L) 05/17/2019   GFRAA 40 (L) 05/17/2019    Lab Results  Component Value Date   WBC 7.0 05/17/2019   NEUTROABS 3.6 05/17/2019   HGB 10.5 (L) 05/17/2019   HCT 32.1 (L) 05/17/2019   MCV 89.7 05/17/2019   PLT 268 05/17/2019     STUDIES: No results found.  ASSESSMENT: Stage III renal cell carcinoma, left.  PLAN:  1. Stage III renal cell carcinoma, left: Patient underwent nephrectomy on January 01, 2017.  CT scan results from May 24, 2019 reviewed independently and reported as  above with no obvious evidence of recurrent or progressive disease. According NCCN guidelines, patient will require abdominal/pelvic CT scan approximately every 6 months for 3 years after her nephrectomy. She then will require annual imaging for a total of 5 years.  Return to clinic in 6 months with repeat imaging and further evaluation.  2. Anemia: Patient's hemoglobin remains decreased, but slightly improved to 10.5.  Given her chronic renal insufficiency she may benefit from Procrit in the future. 3. Chronic renal insufficiency: Patient's creatinine appears to be approximately at her baseline.  Appreciate nephrology input.  I provided 15 minutes of non face-to-face telephone visit time during this encounter, and > 50% was spent counseling as documented under my assessment & plan.   Patient expressed understanding and was in agreement with this plan. She also understands that She can call clinic at any time with any questions, concerns, or complaints.   Cancer Staging Renal cancer, left [facility]) Staging form: Kidney, AJCC 8th Edition - Clinical stage from 01/26/2017: Stage III (cT3a, cNX, cM0) - Signed by Lloyd Huger, MD on 01/26/2017   Lloyd Huger, MD   12/15/2019 6:35 AM

## 2019-12-19 ENCOUNTER — Inpatient Hospital Stay: Payer: 59 | Admitting: Oncology

## 2019-12-19 ENCOUNTER — Inpatient Hospital Stay: Payer: 59

## 2019-12-29 ENCOUNTER — Ambulatory Visit (INDEPENDENT_AMBULATORY_CARE_PROVIDER_SITE_OTHER): Payer: 59 | Admitting: Nurse Practitioner

## 2019-12-29 ENCOUNTER — Encounter: Payer: Self-pay | Admitting: Nurse Practitioner

## 2019-12-29 ENCOUNTER — Other Ambulatory Visit: Payer: Self-pay

## 2019-12-29 VITALS — BP 131/80 | HR 79 | Temp 98.2°F | Ht 67.0 in | Wt 212.0 lb

## 2019-12-29 DIAGNOSIS — I1310 Hypertensive heart and chronic kidney disease without heart failure, with stage 1 through stage 4 chronic kidney disease, or unspecified chronic kidney disease: Secondary | ICD-10-CM

## 2019-12-29 DIAGNOSIS — F324 Major depressive disorder, single episode, in partial remission: Secondary | ICD-10-CM

## 2019-12-29 DIAGNOSIS — N1832 Chronic kidney disease, stage 3b: Secondary | ICD-10-CM

## 2019-12-29 DIAGNOSIS — C642 Malignant neoplasm of left kidney, except renal pelvis: Secondary | ICD-10-CM | POA: Diagnosis not present

## 2019-12-29 DIAGNOSIS — E7800 Pure hypercholesterolemia, unspecified: Secondary | ICD-10-CM

## 2019-12-29 DIAGNOSIS — E6609 Other obesity due to excess calories: Secondary | ICD-10-CM

## 2019-12-29 DIAGNOSIS — Z6833 Body mass index (BMI) 33.0-33.9, adult: Secondary | ICD-10-CM

## 2019-12-29 MED ORDER — CITALOPRAM HYDROBROMIDE 20 MG PO TABS: 20.0000 mg | ORAL_TABLET | Freq: Every day | ORAL | 3 refills | Status: DC

## 2019-12-29 MED ORDER — AMLODIPINE BESYLATE 5 MG PO TABS: 5.0000 mg | ORAL_TABLET | Freq: Every day | ORAL | 3 refills | Status: DC

## 2019-12-29 MED ORDER — ATENOLOL 100 MG PO TABS: 100.0000 mg | ORAL_TABLET | Freq: Every day | ORAL | 3 refills | Status: DC

## 2019-12-29 NOTE — Assessment & Plan Note (Signed)
Chronic, ongoing.  Recent labs obtained with nephrology.  Continue current medication regimen and collaboration with nephrology.

## 2019-12-29 NOTE — Patient Instructions (Signed)
We are recommending the vaccine to everyone who has not had an allergic reaction to any of the components of the vaccine. If you have specific questions about the vaccine, please bring them up with your health care provider to discuss them.   We will likely not be getting the vaccine in the office for the first rounds of vaccinations. The way they are releasing the vaccines is going to be through the health systems (like [place], [place], [facility], [place]) or through your county health department.   The [facility] Department is giving vaccines to those 75+ starting 12/06/19  M-F 7AM to 4PM [employer] [address], [place], [place] in a drive through tent  If you are 65+ you can get a vaccine through [facility] by signing up for an appointment.  You can sign up by going to: [email or web address].  You can get more information by going to: [email or web address]  [facility]

## 2019-12-29 NOTE — Assessment & Plan Note (Signed)
Chronic, ongoing.  Continue current medication regimen and adjust as needed.  Denies SI/HI.

## 2019-12-29 NOTE — Assessment & Plan Note (Signed)
Chronic, ongoing and followed by nephrology. BP at goal today, recommend she continue to check at home and monitor diet, DASH diet. Continue current medication regimen and adjust as needed + collaboration with nephrology and CA team.  Return in 6 months.

## 2019-12-29 NOTE — Assessment & Plan Note (Signed)
Recommend continued focus on healthy diet choices and regular physical activity (30 minutes 5 days a week).  

## 2019-12-29 NOTE — Progress Notes (Signed)
BP 131/80   Pulse 79   Temp 98.2 F (36.8 C) (Oral)   Ht 5\' 7"  (1.702 m)   Wt 212 lb (96.2 kg)   SpO2 96%   BMI 33.20 kg/m    Subjective:    Patient ID: Sarah [name], female    DOB: 1951/07/24, 69 y.o.   MRN: [id number]  HPI: Sarah [name] is a 69 y.o. female  Chief Complaint  Patient presents with  . Hypertension  . Hyperlipidemia  . Depression  . Cancer   HYPERTENSION / HYPERLIPIDEMIA  Continues on Amlodipine, Atenolol, Benazepril-HCTZ. Recent labs 10/04/2019 with CRT 1.55 and GFR 40, she is followed by Dr. Candiss Norse and last saw on 10/04/2019. Continues on Atorvastatin.   Satisfied with current treatment? yes Duration of hypertension: chronic BP monitoring frequency: rarely BP range: 120-130/80 --- and sometimes higher depending on what she eachs BP medication side effects: no Duration of hyperlipidemia: chronic Cholesterol medication side effects: no Cholesterol supplements: none Medication compliance: good compliance Aspirin: no Recent stressors: no Recurrent headaches: no Visual changes: no Palpitations: no Dyspnea: no Chest pain: no Lower extremity edema: no Dizzy/lightheaded: no   DEPRESSION Continues on Celexa 20 MG. Mood status: controlled Satisfied with current treatment?: yes Symptom severity: mild  Duration of current treatment : chronic Side effects: no Medication compliance: excellent compliance Psychotherapy/counseling: none Anxious mood: no Anhedonia: no Significant weight loss or gain: no Insomnia: none Fatigue: no Feelings of worthlessness or guilt: no Impaired concentration/indecisiveness: no Suicidal ideations: no Hopelessness: no Crying spells: no Depression screen [facility] 2/9 12/29/2019 06/28/2019 05/24/2019 12/28/2018 06/27/2018  Decreased Interest 0 0 0 0 0  Down, Depressed, Hopeless 0 0 0 0 0  PHQ - 2 Score 0 0 0 0 0  Altered sleeping 0 0 - 0 0  Tired, decreased energy 0 1 - 0 1  Change in appetite 0 1 - 0 1  Feeling bad or  failure about yourself  0 0 - 0 0  Trouble concentrating 0 0 - 0 0  Moving slowly or fidgety/restless 0 0 - 0 0  Suicidal thoughts 0 0 - 0 0  PHQ-9 Score 0 2 - 0 2  Difficult doing work/chores Not difficult at all Not difficult at all - Not difficult at all Not difficult at all    RENAL CANCER STAGE III: Followed by Dr. Grayland Ormond and has CT scan upcoming.  Had left nephrectomy on January 01, 2017.  She will require abdominal/pelvic CT scan approximately every 6 months for 3 years after her nephrectomy.  Relevant past medical, surgical, family and social history reviewed and updated as indicated. Interim medical history since our last visit reviewed. Allergies and medications reviewed and updated.  Review of Systems  Constitutional: Negative for activity change, appetite change, diaphoresis, fatigue and fever.  Respiratory: Negative for cough, chest tightness and shortness of breath.   Cardiovascular: Negative for chest pain, palpitations and leg swelling.  Gastrointestinal: Negative for abdominal distention, abdominal pain, constipation, diarrhea, nausea and vomiting.  Neurological: Negative for dizziness, syncope, weakness, light-headedness, numbness and headaches.  Psychiatric/Behavioral: Negative.     Per HPI unless specifically indicated above     Objective:    BP 131/80   Pulse 79   Temp 98.2 F (36.8 C) (Oral)   Ht 5\' 7"  (1.702 m)   Wt 212 lb (96.2 kg)   SpO2 96%   BMI 33.20 kg/m   Wt Readings from Last 3 Encounters:  12/29/19 212 lb (96.2 kg)  06/07/19  209 lb (94.8 kg)  05/24/19 210 lb (95.3 kg)    Physical Exam Vitals and nursing note reviewed.  Constitutional:      General: She is awake. She is not in acute distress.    Appearance: She is well-developed. She is obese. She is not ill-appearing.  HENT:     Head: Normocephalic.     Right Ear: Hearing normal.     Left Ear: Hearing normal.     Nose: Nose normal.     Mouth/Throat:     Mouth: Mucous membranes are  moist.  Eyes:     General: Lids are normal.        Right eye: No discharge.        Left eye: No discharge.     Conjunctiva/sclera: Conjunctivae normal.     Pupils: Pupils are equal, round, and reactive to light.  Neck:     Vascular: No carotid bruit.  Cardiovascular:     Rate and Rhythm: Normal rate and regular rhythm.     Heart sounds: Normal heart sounds. No murmur. No gallop.   Pulmonary:     Effort: Pulmonary effort is normal.     Breath sounds: Normal breath sounds.  Abdominal:     General: Bowel sounds are normal.     Palpations: Abdomen is soft.  Musculoskeletal:     Cervical back: Normal range of motion and neck supple.     Right lower leg: No edema.     Left lower leg: No edema.  Skin:    General: Skin is warm and dry.  Neurological:     Mental Status: She is alert and oriented to person, place, and time.  Psychiatric:        Attention and Perception: Attention normal.        Mood and Affect: Mood normal.        Behavior: Behavior normal. Behavior is cooperative.        Thought Content: Thought content normal.        Judgment: Judgment normal.     Results for orders placed or performed in visit on 08/30/19  Novel Coronavirus, NAA ([facility])   Specimen: Oropharyngeal(OP) collection in vial transport medium   OROPHARYNGEA  TESTING  Result Value Ref Range   SARS-CoV-2, NAA Not Detected Not Detected      Assessment & Plan:   Problem List Items Addressed This Visit      Cardiovascular and Mediastinum   Hypertensive heart/kidney disease without HF and with CKD stage III    Chronic, ongoing and followed by nephrology. BP at goal today, recommend she continue to check at home and monitor diet, DASH diet. Continue current medication regimen and adjust as needed + collaboration with nephrology and CA team.  Return in 6 months.        Relevant Medications   amLODipine (NORVASC) 5 MG tablet   atenolol (TENORMIN) 100 MG tablet     Genitourinary   Renal cancer, left  (HCC) - Primary    Followed by Dr. Grayland Ormond.  Continue this collaboration and review of notes.      Chronic kidney disease, stage 3    Chronic, ongoing.  Recent labs obtained with nephrology.  Continue current medication regimen and collaboration with nephrology.        Other   Hyperlipidemia    Chronic, ongoing.  Continue current medication regimen and adjust as needed.  Lipid panel today.        Relevant Medications   amLODipine (NORVASC)  5 MG tablet   atenolol (TENORMIN) 100 MG tablet   Other Relevant Orders   Lipid Panel w/o Chol/HDL Ratio   Depression    Chronic, ongoing.  Continue current medication regimen and adjust as needed.  Denies SI/HI.        Relevant Medications   citalopram (CELEXA) 20 MG tablet   Obesity    Recommend continued focus on healthy diet choices and regular physical activity (30 minutes 5 days a week).           Follow up plan: Return in about 6 months (around 06/27/2020) for HTN/HLD, Cancer, CKD, MOOD.

## 2019-12-29 NOTE — Assessment & Plan Note (Signed)
Followed by Dr. Grayland Ormond.  Continue this collaboration and review of notes.

## 2019-12-29 NOTE — Assessment & Plan Note (Signed)
Chronic, ongoing.  Continue current medication regimen and adjust as needed. Lipid panel today. 

## 2019-12-30 LAB — LIPID PANEL W/O CHOL/HDL RATIO
Cholesterol, Total: 111 mg/dL (ref 100–199)
HDL: 49 mg/dL (ref >39)
LDL Chol Calc (NIH): 52 mg/dL (ref 0–99)
Triglycerides: 38 mg/dL (ref 0–149)
VLDL Cholesterol Cal: 10 mg/dL (ref 5–40)

## 2019-12-31 NOTE — Progress Notes (Signed)
Contacted via MyChart

## 2020-01-01 ENCOUNTER — Telehealth: Payer: Self-pay

## 2020-01-01 NOTE — Telephone Encounter (Signed)
Has this been filled out already? Copied from [place] [phone]. Topic: General - Inquiry >> Jan 01, 2020  4:33 PM Mathis Bud wrote: Reason for CRM: Patient is requesting handicapped paperwork be mailed to her. Patient states she spoke with PCP regarding this on last visit. Patient address [address]  [place] [address]

## 2020-01-01 NOTE — Telephone Encounter (Signed)
She needs to fill out and bring into office and then I will sign for her.:)

## 2020-01-02 ENCOUNTER — Other Ambulatory Visit: Payer: Self-pay | Admitting: Emergency Medicine

## 2020-01-02 DIAGNOSIS — C642 Malignant neoplasm of left kidney, except renal pelvis: Secondary | ICD-10-CM

## 2020-01-02 NOTE — Telephone Encounter (Signed)
Patient notified that she will need to bring in the paperwork to be filled out.

## 2020-01-03 ENCOUNTER — Ambulatory Visit
Admission: RE | Admit: 2020-01-03 | Discharge: 2020-01-03 | Disposition: A | Discharge status: Home / Self Care | Payer: 59 | Source: Ambulatory Visit | Attending: Oncology | Admitting: Oncology

## 2020-01-03 ENCOUNTER — Other Ambulatory Visit: Payer: Self-pay

## 2020-01-03 ENCOUNTER — Inpatient Hospital Stay: Payer: 59 | Attending: Oncology

## 2020-01-03 DIAGNOSIS — C642 Malignant neoplasm of left kidney, except renal pelvis: Secondary | ICD-10-CM | POA: Insufficient documentation

## 2020-01-03 DIAGNOSIS — Z803 Family history of malignant neoplasm of breast: Secondary | ICD-10-CM | POA: Insufficient documentation

## 2020-01-03 DIAGNOSIS — N289 Disorder of kidney and ureter, unspecified: Secondary | ICD-10-CM | POA: Diagnosis not present

## 2020-01-03 DIAGNOSIS — Z800 Family history of malignant neoplasm of digestive organs: Secondary | ICD-10-CM | POA: Diagnosis not present

## 2020-01-03 DIAGNOSIS — I10 Essential (primary) hypertension: Secondary | ICD-10-CM | POA: Insufficient documentation

## 2020-01-03 DIAGNOSIS — Z905 Acquired absence of kidney: Secondary | ICD-10-CM | POA: Insufficient documentation

## 2020-01-03 DIAGNOSIS — Z90710 Acquired absence of both cervix and uterus: Secondary | ICD-10-CM | POA: Diagnosis not present

## 2020-01-03 DIAGNOSIS — Z85528 Personal history of other malignant neoplasm of kidney: Secondary | ICD-10-CM | POA: Insufficient documentation

## 2020-01-03 DIAGNOSIS — Z8041 Family history of malignant neoplasm of ovary: Secondary | ICD-10-CM | POA: Diagnosis not present

## 2020-01-03 DIAGNOSIS — Z79899 Other long term (current) drug therapy: Secondary | ICD-10-CM | POA: Insufficient documentation

## 2020-01-03 DIAGNOSIS — Z8042 Family history of malignant neoplasm of prostate: Secondary | ICD-10-CM | POA: Insufficient documentation

## 2020-01-03 DIAGNOSIS — F17210 Nicotine dependence, cigarettes, uncomplicated: Secondary | ICD-10-CM | POA: Diagnosis not present

## 2020-01-03 LAB — COMPREHENSIVE METABOLIC PANEL
ALT: 14 U/L (ref 0–44)
AST: 18 U/L (ref 15–41)
Albumin: 3.7 g/dL (ref 3.5–5.0)
Alkaline Phosphatase: 67 U/L (ref 38–126)
Anion gap: 8 (ref 5–15)
BUN: 37 mg/dL — ABNORMAL HIGH (ref 8–23)
CO2: 21 mmol/L — ABNORMAL LOW (ref 22–32)
Calcium: 9.1 mg/dL (ref 8.9–10.3)
Chloride: 112 mmol/L — ABNORMAL HIGH (ref 98–111)
Creatinine, Ser: 1.69 mg/dL — ABNORMAL HIGH (ref 0.44–1.00)
GFR calc Af Amer: 36 mL/min — ABNORMAL LOW (ref >60)
GFR calc non Af Amer: 31 mL/min — ABNORMAL LOW (ref >60)
Glucose, Bld: 105 mg/dL — ABNORMAL HIGH (ref 70–99)
Potassium: 4 mmol/L (ref 3.5–5.1)
Sodium: 141 mmol/L (ref 135–145)
Total Bilirubin: 0.5 mg/dL (ref 0.3–1.2)
Total Protein: 7.3 g/dL (ref 6.5–8.1)

## 2020-01-03 LAB — CBC
HCT: 33.2 % — ABNORMAL LOW (ref 36.0–46.0)
Hemoglobin: 10.7 g/dL — ABNORMAL LOW (ref 12.0–15.0)
MCH: 29.5 pg (ref 26.0–34.0)
MCHC: 32.2 g/dL (ref 30.0–36.0)
MCV: 91.5 fL (ref 80.0–100.0)
Platelets: 281 10*3/uL (ref 150–400)
RBC: 3.63 MIL/uL — ABNORMAL LOW (ref 3.87–5.11)
RDW: 14.4 % (ref 11.5–15.5)
WBC: 6.3 10*3/uL (ref 4.0–10.5)
nRBC: 0 % (ref 0.0–0.2)

## 2020-01-03 MED ORDER — IOHEXOL 300 MG/ML  SOLN
75.0000 mL | Freq: Once | INTRAMUSCULAR | Status: AC | PRN
  Administered 2020-01-03: 75 mL via INTRAVENOUS

## 2020-01-03 NOTE — Telephone Encounter (Signed)
Tried calling patient to let her know her paperwork was ready for pickup, no answer. LVM to return call.

## 2020-01-05 NOTE — Progress Notes (Signed)
[place]  [phone] [phone]  ID: [name] OB: 1950-12-26  MR#: [id number]  DVV#:[id number]  Patient Care Team: Venita Lick, NP as PCP - General (Nurse Practitioner) Nickie Retort, MD as Consulting Physician (Urology) Lloyd Huger, MD as Consulting Physician (Oncology)   CHIEF COMPLAINT: Stage III renal cell carcinoma, left.  INTERVAL HISTORY: Patient returns to clinic today for further evaluation and discussion of her imaging results.  She continues to feel well remains asymptomatic. She has no neurologic complaints. She denies any recent fevers or illnesses. She has a good appetite and denies weight loss.  She denies any chest pain, shortness of breath, cough, or hemoptysis.  She has no abdominal pain.  She denies any nausea, vomiting, constipation, or diarrhea. She has no urinary complaints.  Patient offers no specific complaints today.    REVIEW OF SYSTEMS:   Review of Systems  Constitutional: Negative.  Negative for fever, malaise/fatigue and weight loss.  Respiratory: Negative.  Negative for cough and shortness of breath.   Cardiovascular: Negative.  Negative for chest pain and leg swelling.  Gastrointestinal: Negative.  Negative for abdominal pain.  Genitourinary: Negative.  Negative for flank pain and hematuria.  Musculoskeletal: Negative.  Negative for back pain.  Skin: Negative.  Negative for rash.  Neurological: Negative.  Negative for sensory change, focal weakness and weakness.  Psychiatric/Behavioral: Negative.  The patient is not nervous/anxious.     As per HPI. Otherwise, a complete review of systems is negative.  PAST MEDICAL HISTORY: Past Medical History:  Diagnosis Date  . Abnormal mammogram, unspecified   . Aneurysm ([place]) 2004   cerebral x  4, [facility]  . Anxiety   . Arthritis   . Benign neoplasm of breast 2012  . Breast complaint 2010  . Chronic kidney disease    Renal Cell  Cancer  . Hypertension   . Obesity, unspecified   . Personal history of tobacco use, presenting hazards to health   . Renal cancer, left ([place]) 11/2016   Renal Cell with Left nephrectomy.  Marland Kitchen Special screening for malignant neoplasms, colon     PAST SURGICAL HISTORY: Past Surgical History:  Procedure Laterality Date  . ABDOMINAL HYSTERECTOMY  1993  . BREAST BIOPSY Right 2012   stereo bx showing a 31mm radial scar in an estimated 8 volume of tissue  . BREAST BIOPSY Right Apr 09, 2005   Fibrocystic changes, ductal adenosis, microcalcifications, focal stromal/epithelial proliferation, possible fibroadenoma type proliferation  . CEREBRAL ANEURYSM REPAIR  2004  . COLONOSCOPY  2008   Dr. Nicolasa Ducking  . EYE SURGERY    . FOOT SURGERY Right 2003  . LAPAROSCOPIC NEPHRECTOMY, HAND ASSISTED Left 01/01/2017   Procedure: HAND ASSISTED LAPAROSCOPIC NEPHRECTOMY;  Surgeon: Nickie Retort, MD;  Location: [facility] ORS;  Service: Urology;  Laterality: Left;  . TONSILLECTOMY  1997    FAMILY HISTORY: Family History  Problem Relation Age of Onset  . Cancer Cousin        maternal first cousin with breast cancer  . Cancer Other        colon and ovarian cancers, no relationship listed  . Cancer Father        prostate  . Hypertension Father   . Hypertension Sister   . Hypertension Brother   . Hypertension Paternal Grandmother   . Heart disease Paternal Grandmother   . Hypertension Paternal Grandfather   . Heart disease Paternal Grandfather   . Breast cancer Neg Hx  ADVANCED DIRECTIVES (Y/N):  N  HEALTH MAINTENANCE: Social History   Tobacco Use  . Smoking status: Current Every Day Smoker    Packs/day: 0.25    Years: 40.00    Pack years: 10.00    Types: Cigarettes  . Smokeless tobacco: Never Used  Substance Use Topics  . Alcohol use: Yes    Comment: socially  . Drug use: No     Colonoscopy:  PAP:  Bone density:  Lipid panel:  Allergies  Allergen Reactions  . Codeine Itching     Current Outpatient Medications  Medication Sig Dispense Refill  . acetaminophen (TYLENOL) 325 MG tablet Take 650 mg by mouth every 6 (six) hours as needed for mild pain, moderate pain or headache.     Marland Kitchen amLODipine (NORVASC) 5 MG tablet Take 1 tablet (5 mg total) by mouth daily. 90 tablet 3  . atenolol (TENORMIN) 100 MG tablet Take 1 tablet (100 mg total) by mouth daily. 90 tablet 3  . atorvastatin (LIPITOR) 40 MG tablet TAKE 1 TABLET BY MOUTH EVERY DAY 90 tablet 3  . benazepril-hydrochlorthiazide (LOTENSIN HCT) 20-25 MG tablet Take 1 tablet by mouth daily. 90 tablet 3  . Cholecalciferol (VITAMIN D-3 PO) Take 600 Units by mouth daily.    . citalopram (CELEXA) 20 MG tablet Take 1 tablet (20 mg total) by mouth daily. 90 tablet 3  . Loperamide HCl (IMODIUM PO) Take by mouth. PRN    . Multiple Vitamin (MULTIVITAMIN) tablet Take 1 tablet by mouth daily.    . traMADol (ULTRAM) 50 MG tablet TAKE 1 TABLET (50 MG TOTAL) BY MOUTH EVERY 12 (TWELVE) HOURS AS NEEDED. 14 tablet 0   No current facility-administered medications for this visit.    OBJECTIVE: Vitals:   01/10/20 1038  BP: (!) 140/54  Pulse: 63  Resp: 18  Temp: (!) 96 F (35.6 C)     Body mass index is 33.2 kg/m.    ECOG FS:0 - Asymptomatic  General: Well-developed, well-nourished, no acute distress. Eyes: Pink conjunctiva, anicteric sclera. HEENT: Normocephalic, moist mucous membranes. Lungs: No audible wheezing or coughing. Heart: Regular rate and rhythm. Abdomen: Soft, nontender, no obvious distention. Musculoskeletal: No edema, cyanosis, or clubbing. Neuro: Alert, answering all questions appropriately. Cranial nerves grossly intact. Skin: No rashes or petechiae noted. Psych: Normal affect.   LAB RESULTS:  Lab Results  Component Value Date   NA 141 01/03/2020   K 4.0 01/03/2020   CL 112 (H) 01/03/2020   CO2 21 (L) 01/03/2020   GLUCOSE 105 (H) 01/03/2020   BUN 37 (H) 01/03/2020   CREATININE 1.69 (H) 01/03/2020    CALCIUM 9.1 01/03/2020   PROT 7.3 01/03/2020   ALBUMIN 3.7 01/03/2020   AST 18 01/03/2020   ALT 14 01/03/2020   ALKPHOS 67 01/03/2020   BILITOT 0.5 01/03/2020   GFRNONAA 31 (L) 01/03/2020   GFRAA 36 (L) 01/03/2020    Lab Results  Component Value Date   WBC 6.3 01/03/2020   NEUTROABS 3.6 05/17/2019   HGB 10.7 (L) 01/03/2020   HCT 33.2 (L) 01/03/2020   MCV 91.5 01/03/2020   PLT 281 01/03/2020     STUDIES: CT Abdomen W Contrast  Result Date: 01/03/2020 CLINICAL DATA:  Renal cell cancer, status post left nephrectomy EXAM: CT ABDOMEN WITH CONTRAST TECHNIQUE: Multidetector CT imaging of the abdomen was performed using the standard protocol following bolus administration of intravenous contrast. CONTRAST:  17mL OMNIPAQUE IOHEXOL 300 MG/ML  SOLN COMPARISON:  Unenhanced CT abdomen dated 05/25/2019 FINDINGS:  Lower chest: Lung bases are essentially clear. Hepatobiliary: Liver is within normal limits. 2.3 cm gallstone in the gallbladder fundus. No associated inflammatory changes. No intrahepatic or extrahepatic ductal dilatation. Pancreas: Within normal limits. Spleen: Within normal limits. Adrenals/Urinary Tract: 2.9 cm right adrenal mass (series 2/image 19), technically without enhancement characteristics diagnostic of a benign adrenal adenoma, although this diagnosis favored given lack of interval change since 2018. Left adrenal gland is within normal limits. Status post left nephrectomy. No abnormal soft tissue in the surgical bed. Multiple right renal cysts of varying sizes and complexities, many of which are hyperdense on enhanced CT, without the enhancement on delayed imaging, and are likely benign hemorrhagic cysts when correlating with prior unenhanced CT. Lack of enhancement is difficult to entirely confirm when comparing cross multiple studies, but these are no suspicious lesions noted. No hydronephrosis. Stomach/Bowel: Stomach is within normal limits. Visualized bowel is unremarkable.  Vascular/Lymphatic: No evidence of abdominal aortic aneurysm. Atherosclerotic calcifications of the abdominal aorta. No suspicious abdominal lymphadenopathy Other: No abdominal ascites. Musculoskeletal: Mild degenerative changes of the visualized thoracolumbar spine. IMPRESSION: Status post left nephrectomy. No evidence of recurrent or metastatic disease. 2.9 cm right adrenal adenoma, unchanged since 2018. Multiple right renal cysts of varying sizes and complexities, favored to be benign (Bosniak I-II). When correlating with prior unenhanced CT, none of these lesions are favored to enhance. Cholelithiasis, without associated inflammatory changes. Electronically Signed   By: Julian Hy M.D.   On: 01/03/2020 14:07    ASSESSMENT: Stage III renal cell carcinoma, left.  PLAN:  1. Stage III renal cell carcinoma, left: Patient underwent nephrectomy on January 01, 2017.  CT scan results from January 03, 2020 reviewed independently and reported as above with no obvious evidence of recurrent or progressive disease.  Patient is now 3 years removed from her nephrectomy and can be transitioned to yearly imaging.  Return to clinic in 1 year with repeat CT scan and further evaluation. 2. Anemia: Chronic and unchanged.  Patient's hemoglobin remains decreased, but relatively stable at 10.7.  Given her chronic renal insufficiency, she may benefit from Retacrit in the future. 3. Chronic renal insufficiency: Patient's most recent creatinine is 1.69.  Monitor.  Appreciate nephrology input.    Patient expressed understanding and was in agreement with this plan. She also understands that She can call clinic at any time with any questions, concerns, or complaints.   Cancer Staging Renal cancer, left [facility]) Staging form: Kidney, AJCC 8th Edition - Clinical stage from 01/26/2017: Stage III (cT3a, cNX, cM0) - Signed by Lloyd Huger, MD on 01/26/2017   Lloyd Huger, MD   01/12/2020 6:50 AM

## 2020-01-10 ENCOUNTER — Inpatient Hospital Stay (HOSPITAL_BASED_OUTPATIENT_CLINIC_OR_DEPARTMENT_OTHER): Payer: 59 | Admitting: Oncology

## 2020-01-10 ENCOUNTER — Other Ambulatory Visit: Payer: Self-pay

## 2020-01-10 ENCOUNTER — Encounter: Payer: Self-pay | Admitting: Oncology

## 2020-01-10 VITALS — BP 140/54 | HR 63 | Temp 96.0°F | Resp 18 | Wt 212.0 lb

## 2020-01-10 DIAGNOSIS — C642 Malignant neoplasm of left kidney, except renal pelvis: Secondary | ICD-10-CM | POA: Diagnosis not present

## 2020-01-10 DIAGNOSIS — Z85528 Personal history of other malignant neoplasm of kidney: Secondary | ICD-10-CM | POA: Diagnosis not present

## 2020-02-09 ENCOUNTER — Encounter: Payer: Self-pay | Admitting: Nurse Practitioner

## 2020-03-20 ENCOUNTER — Other Ambulatory Visit: Payer: Self-pay | Admitting: Nurse Practitioner

## 2020-03-20 NOTE — Telephone Encounter (Signed)
Requested Prescriptions  Pending Prescriptions Disp Refills  . benazepril-hydrochlorthiazide (LOTENSIN HCT) 20-25 MG tablet [Pharmacy Med Name: BENAZEPRIL-HCTZ 20-25 MG TAB] 90 tablet 0    Sig: TAKE 1 TABLET BY MOUTH EVERY DAY     Cardiovascular:  ACEI + Diuretic Combos Failed - 03/20/2020  3:20 AM      Failed - Cr in normal range and within 180 days    Creatinine, Ser  Date Value Ref Range Status  01/03/2020 1.69 (H) 0.44 - 1.00 mg/dL Final         Failed - Last BP in normal range    BP Readings from Last 1 Encounters:  01/10/20 (!) 140/54         Passed - Na in normal range and within 180 days    Sodium  Date Value Ref Range Status  01/03/2020 141 135 - 145 mmol/L Final  06/27/2018 144 134 - 144 mmol/L Final         Passed - K in normal range and within 180 days    Potassium  Date Value Ref Range Status  01/03/2020 4.0 3.5 - 5.1 mmol/L Final         Passed - Ca in normal range and within 180 days    Calcium  Date Value Ref Range Status  01/03/2020 9.1 8.9 - 10.3 mg/dL Final         Passed - Patient is not pregnant      Passed - Valid encounter within last 6 months    Recent Outpatient Visits          2 months ago Renal cancer, left ([place])   [place], Jolene T, NP   8 months ago Class 2 severe obesity due to excess calories with serious comorbidity and body mass index (BMI) of 35.0 to 35.9 in adult [facility])   [place], Jolene T, NP   1 year ago Hypertensive heart/kidney disease without HF and with CKD stage III ([place])   [place], Jolene T, NP   1 year ago Chronic kidney disease, stage 3 ([place])   [facility] Kathrine Haddock, NP   2 years ago Pure hypercholesterolemia   [place] Kathrine Haddock, NP      Future Appointments            In 2 months Hollice Espy, MD [place]   In 3 months Cannady, Barbaraann Faster, NP [facility]            Patient has an appointment to follow-up with provider in 3 months.

## 2020-04-22 ENCOUNTER — Other Ambulatory Visit: Payer: Self-pay | Admitting: Nurse Practitioner

## 2020-04-22 MED ORDER — TRAMADOL HCL 50 MG PO TABS: 50.0000 mg | ORAL_TABLET | Freq: Two times a day (BID) | ORAL | 0 refills | Status: DC | PRN

## 2020-04-22 NOTE — Telephone Encounter (Signed)
Requested medication (s) are due for refill today - unknown  Requested medication (s) are on the active medication list -yes  Future visit scheduled -yes  Last refill: 09/25/20  Notes to clinic: Request for non delegated Rx  Requested Prescriptions  Pending Prescriptions Disp Refills   traMADol (ULTRAM) 50 MG tablet 14 tablet 0    Sig: Take 1 tablet (50 mg total) by mouth every 12 (twelve) hours as needed.      Not Delegated - Analgesics:  Opioid Agonists Failed - 04/22/2020 11:13 AM      Failed - This refill cannot be delegated      Failed - Urine Drug Screen completed in last 360 days.      Passed - Valid encounter within last 6 months    Recent Outpatient Visits           3 months ago Renal cancer, left ([place])   [place], Jolene T, NP   9 months ago Class 2 severe obesity due to excess calories with serious comorbidity and body mass index (BMI) of 35.0 to 35.9 in adult [facility])   [place], Henrine Screws T, NP   1 year ago Hypertensive heart/kidney disease without HF and with CKD stage III ([place])   [place], Jolene T, NP   1 year ago Chronic kidney disease, stage 3 ([place])   [place] Kathrine Haddock, NP   2 years ago Pure hypercholesterolemia   [place] Kathrine Haddock, NP       Future Appointments             In 1 month Hollice Espy, MD [place]   In 2 months [place], [place] T, NP [facility]                Requested Prescriptions  Pending Prescriptions Disp Refills   traMADol (ULTRAM) 50 MG tablet 14 tablet 0    Sig: Take 1 tablet (50 mg total) by mouth every 12 (twelve) hours as needed.      Not Delegated - Analgesics:  Opioid Agonists Failed - 04/22/2020 11:13 AM      Failed - This refill cannot be delegated      Failed - Urine Drug Screen completed in last 360 days.      Passed - Valid encounter within last 6 months    Recent Outpatient Visits           3 months ago Renal cancer, left ([place])   [place], Jolene T, NP   9 months ago Class 2 severe obesity due to excess calories with serious comorbidity and body mass index (BMI) of 35.0 to 35.9 in adult [facility])   [place], Henrine Screws T, NP   1 year ago Hypertensive heart/kidney disease without HF and with CKD stage III ([place])   [place] Cannady, Jolene T, NP   1 year ago Chronic kidney disease, stage 3 ([place])   [place] Kathrine Haddock, NP   2 years ago Pure hypercholesterolemia   [facility] Kathrine Haddock, NP       Future Appointments             In 1 month Hollice Espy, MD [place]   In 2 months Cannady, Barbaraann Faster, NP [employer] [facility] [place]

## 2020-04-22 NOTE — Telephone Encounter (Signed)
Medication: traMADol (ULTRAM) 50 MG tablet [[id number]]  last ov was 12/29/19  Has the patient contacted their pharmacy? yes (Agent: If no, request that the patient contact the pharmacy for the refill.) (Agent: If yes, when and what did the pharmacy advise?)  Preferred Pharmacy (with phone number or street name): [id number] - [place], [place] [address]  Phone:  [phone] Fax:  [phone]     Agent: Please be advised that RX refills may take up to 3 business days. We ask that you follow-up with your pharmacy.

## 2020-04-22 NOTE — Telephone Encounter (Signed)
Patient last seen 12/29/19 and has appointment 07/08/20.

## 2020-05-24 ENCOUNTER — Telehealth: Payer: Self-pay | Admitting: *Deleted

## 2020-05-24 DIAGNOSIS — Z87891 Personal history of nicotine dependence: Secondary | ICD-10-CM

## 2020-05-24 DIAGNOSIS — Z122 Encounter for screening for malignant neoplasm of respiratory organs: Secondary | ICD-10-CM

## 2020-05-24 NOTE — Telephone Encounter (Signed)
Patient was notified that is time for her Low Dose Lung Cancer Screening CT. She currently smokes less than 1/2 packs of cigarettes a day. She has had no major events this past year. No evaluation or treatment for cancer. She had her Covid injs. 02/01/20 & 02/29/20. Her insurane has not changed, But will change to [facility] in July. She is off on Wed so please schedule her CT on a Wed.

## 2020-05-27 NOTE — Addendum Note (Signed)
Addended by: Lieutenant Diego on: 05/27/2020 12:57 PM   Modules accepted: Orders

## 2020-05-27 NOTE — Telephone Encounter (Signed)
Smoking history: current, 42.25 pack year

## 2020-06-05 ENCOUNTER — Other Ambulatory Visit: Payer: Self-pay

## 2020-06-05 ENCOUNTER — Ambulatory Visit
Admission: RE | Admit: 2020-06-05 | Discharge: 2020-06-05 | Disposition: A | Discharge status: Home / Self Care | Payer: BC Managed Care – PPO | Source: Ambulatory Visit | Attending: Oncology | Admitting: Oncology

## 2020-06-05 ENCOUNTER — Ambulatory Visit
Admission: RE | Admit: 2020-06-05 | Discharge: 2020-06-05 | Disposition: A | Discharge status: Home / Self Care | Payer: BC Managed Care – PPO | Source: Ambulatory Visit | Attending: Urology | Admitting: Urology

## 2020-06-05 DIAGNOSIS — Z87891 Personal history of nicotine dependence: Secondary | ICD-10-CM | POA: Insufficient documentation

## 2020-06-05 DIAGNOSIS — C642 Malignant neoplasm of left kidney, except renal pelvis: Secondary | ICD-10-CM

## 2020-06-05 DIAGNOSIS — N281 Cyst of kidney, acquired: Secondary | ICD-10-CM

## 2020-06-05 DIAGNOSIS — Z122 Encounter for screening for malignant neoplasm of respiratory organs: Secondary | ICD-10-CM | POA: Diagnosis not present

## 2020-06-10 ENCOUNTER — Encounter: Payer: Self-pay | Admitting: *Deleted

## 2020-06-10 NOTE — Progress Notes (Signed)
06/11/2020 10:31 AM   Sarah [name] [birth year] [id number]  Referring provider: Venita Lick, NP [address] [place],  [place] [address] Chief Complaint  Patient presents with  . Follow-up    HPI: Sarah [name] is a 69 y.o. female who presents today for a 1 year follow up and review of RUS.   She underwent left laparoscopic hand-assisted nephrectomy on 01/01/2017 by Dr. Baruch Gouty.  Surgical pathology c/w multifocal clear cell renal cell carcinoma. 3 distinct tumors were seen. 2 were cystic and the third was solid. There was tumor extension into the renal ssinus. Margins were negative. Lymphovascular invasion was not identified. Histologic grade 2. Greatest dimension was 2.1 cm. Stage pT3a.  CT abdomen on 01/03/2020 revealed s/pt left nephrectomy. No evidence of recurrent or metastatic disease. There was a 2.9 cm right adrenal adenoma, unchanged since 2018. Multiple right renal cysts of varying sizes and complexities, favored to be benign (Bosniak I-II). Cholelithiasis, without associated inflammatory changes.  UA on 04/10/2020 showed trace protein, WBC esterase 2+, WBC >= 60, and moderate bacteria. Associate urine culture  indicative of E. Coli resistant to ampicillin, cefazolin, cefepime, ceftriaxone, cipro, levofloxacin, and nitrofurantoin.   Renal ultrasound on 06/05/2020 revealed numerous right renal cysts. Stable right adrenal nodule. No acute findings.  She reports that she has frequency and urgency. She notes that she is drinking more water.  This is quite bothersome to her and worsening.  She is able to get to the bathroom most of the time and sometimes she has some leakage.   PMH: Past Medical History:  Diagnosis Date  . Abnormal mammogram, unspecified   . Aneurysm ([place]) 2004   cerebral x  4, [facility]  . Anxiety   . Arthritis   . Benign neoplasm of breast 2012  . Breast complaint 2010  . Chronic kidney disease    Renal Cell Cancer  . Hypertension     . Obesity, unspecified   . Personal history of tobacco use, presenting hazards to health   . Renal cancer, left ([place]) 11/2016   Renal Cell with Left nephrectomy.  Marland Kitchen Special screening for malignant neoplasms, colon     Surgical History: Past Surgical History:  Procedure Laterality Date  . ABDOMINAL HYSTERECTOMY  1993  . BREAST BIOPSY Right 2012   stereo bx showing a 52mm radial scar in an estimated 8 volume of tissue  . BREAST BIOPSY Right Apr 09, 2005   Fibrocystic changes, ductal adenosis, microcalcifications, focal stromal/epithelial proliferation, possible fibroadenoma type proliferation  . CEREBRAL ANEURYSM REPAIR  2004  . COLONOSCOPY  2008   Dr. Nicolasa Ducking  . EYE SURGERY    . FOOT SURGERY Right 2003  . LAPAROSCOPIC NEPHRECTOMY, HAND ASSISTED Left 01/01/2017   Procedure: HAND ASSISTED LAPAROSCOPIC NEPHRECTOMY;  Surgeon: Nickie Retort, MD;  Location: [facility] ORS;  Service: Urology;  Laterality: Left;  . TONSILLECTOMY  1997    Home Medications:  Allergies as of 06/11/2020      Reactions   Codeine Itching      Medication List       Accurate as of June 11, 2020 10:31 AM. If you have any questions, ask your nurse or doctor.        acetaminophen 325 MG tablet Commonly known as: TYLENOL Take 650 mg by mouth every 6 (six) hours as needed for mild pain, moderate pain or headache.   amLODipine 5 MG tablet Commonly known as: NORVASC Take 1 tablet (5 mg total) by  mouth daily.   atenolol 100 MG tablet Commonly known as: TENORMIN Take 1 tablet (100 mg total) by mouth daily.   atorvastatin 40 MG tablet Commonly known as: LIPITOR TAKE 1 TABLET BY MOUTH EVERY DAY   benazepril-hydrochlorthiazide 20-25 MG tablet Commonly known as: LOTENSIN HCT TAKE 1 TABLET BY MOUTH EVERY DAY   citalopram 20 MG tablet Commonly known as: CELEXA Take 1 tablet (20 mg total) by mouth daily.   IMODIUM PO Take by mouth. PRN   multivitamin tablet Take 1 tablet by mouth daily.   traMADol 50 MG  tablet Commonly known as: ULTRAM Take 1 tablet (50 mg total) by mouth every 12 (twelve) hours as needed.   VITAMIN D-3 PO Take 600 Units by mouth daily.       Allergies:  Allergies  Allergen Reactions  . Codeine Itching    Family History: Family History  Problem Relation Age of Onset  . Cancer Cousin        maternal first cousin with breast cancer  . Cancer Other        colon and ovarian cancers, no relationship listed  . Cancer Father        prostate  . Hypertension Father   . Hypertension Sister   . Hypertension Brother   . Hypertension Paternal Grandmother   . Heart disease Paternal Grandmother   . Hypertension Paternal Grandfather   . Heart disease Paternal Grandfather   . Breast cancer Neg Hx     Social History:  reports that she has been smoking cigarettes. She has a 10.00 pack-year smoking history. She has never used smokeless tobacco. She reports current alcohol use. She reports that she does not use drugs.   Physical Exam: BP 125/68 (BP Location: Left Arm, Patient Position: Sitting, Cuff Size: Large)   Pulse 73   Ht 5\' 7"  (1.702 m)   Wt 209 lb 6.4 oz (95 kg)   BMI 32.80 kg/m   Constitutional:  Alert and oriented, No acute distress. HEENT: [place] AT, moist mucus membranes.  Trachea midline, no masses. Cardiovascular: No clubbing, cyanosis, or edema. Respiratory: Normal respiratory effort, no increased work of breathing. Skin: No rashes, bruises or suspicious lesions. Neurologic: Grossly intact, no focal deficits, moving all 4 extremities. Psychiatric: Normal mood and affect.  Laboratory Data:  Lab Results  Component Value Date   CREATININE 1.69 (H) 01/03/2020     Pertinent Imaging:  Results for orders placed during the hospital encounter of 06/05/20  US RENAL  Narrative CLINICAL DATA:  Renal cysts.  History of renal cell carcinoma  EXAM: RENAL / URINARY TRACT ULTRASOUND COMPLETE  COMPARISON:  05/24/2019  FINDINGS: Right Kidney:  Renal  measurements: 12.6 x 6.4 x 6.7 cm = volume: 281 mL. Numerous benign appearing right renal cysts, the largest 4.5 cm in the upper pole. No hydronephrosis. Normal echotexture.  Left Kidney:  Renal measurements: Prior nephrectomy  Bladder:  Appears normal for degree of bladder distention.  Other:  Right adrenal mass noted measuring up to 2.9 cm, unchanged since prior CT.  IMPRESSION: Numerous right renal cysts.  Stable right adrenal nodule.  No acute findings.   Electronically Signed By: Rolm Baptise M.D. On: 06/05/2020 21:23   I have personally reviewed the images and agree with radiologist interpretation.  Compared to most recent CT scan   Assessment & Plan:    1. History of renal cell carcinoma of left kidney (HCC) No evidence of disease on most recent cross-sectional imaging No further surveillance imaging  indicated given NED x 3 years per NCCN Also followed by Dr. Grayland Ormond for this  2. Acquired renal cyst of right kidney Minimally complex Bosniak 2 right-sided renal cysts, stable Given her history of renal cell malignancy and a solitary kidney, continue to recommended serial ultrasound annually given her inability to receive IV contrast RUS on 06/05/2020 was stable, will continue to surveillance as this ultrasound will serve as the new basis for comparison. She is agreeable this plan Follow-up in 1 year with renal ultrasound  3. CKD (chronic kidney disease) stage 3, GFR 30-59 ml/min (HCC) Creatinine stable at 1.69 as of 01/2020, solitary kidney precautions reviewed again today  4. Urinary frequency Behavioral modification was to push fluids. Patient interested in trying samples of OAB as this may be a contributing factor. -Trail of Toviaz 4 mg. Patient will call for a Rx if helpful Discussed possible side effects   Follow up in 1 year for RUS.  [place] [address] [place], [place] [address] [phone]  I,  Selena Batten, am acting as a scribe for Dr. Hollice Espy.  I have reviewed the above documentation for accuracy and completeness, and I agree with the above.   Hollice Espy, MD

## 2020-06-11 ENCOUNTER — Encounter: Payer: Self-pay | Admitting: Urology

## 2020-06-11 ENCOUNTER — Ambulatory Visit (INDEPENDENT_AMBULATORY_CARE_PROVIDER_SITE_OTHER): Payer: BC Managed Care – PPO | Admitting: Urology

## 2020-06-11 ENCOUNTER — Other Ambulatory Visit: Payer: Self-pay

## 2020-06-11 VITALS — BP 125/68 | HR 73 | Ht 67.0 in | Wt 209.4 lb

## 2020-06-11 DIAGNOSIS — N281 Cyst of kidney, acquired: Secondary | ICD-10-CM

## 2020-06-28 ENCOUNTER — Ambulatory Visit: Payer: 59 | Admitting: Nurse Practitioner

## 2020-07-08 ENCOUNTER — Other Ambulatory Visit: Payer: Self-pay

## 2020-07-08 ENCOUNTER — Ambulatory Visit (INDEPENDENT_AMBULATORY_CARE_PROVIDER_SITE_OTHER): Payer: BC Managed Care – PPO | Admitting: Nurse Practitioner

## 2020-07-08 ENCOUNTER — Encounter: Payer: Self-pay | Admitting: Nurse Practitioner

## 2020-07-08 VITALS — BP 117/70 | HR 67 | Temp 98.2°F | Wt 209.0 lb

## 2020-07-08 DIAGNOSIS — J432 Centrilobular emphysema: Secondary | ICD-10-CM | POA: Diagnosis not present

## 2020-07-08 DIAGNOSIS — I700 Atherosclerosis of aorta: Secondary | ICD-10-CM

## 2020-07-08 DIAGNOSIS — F324 Major depressive disorder, single episode, in partial remission: Secondary | ICD-10-CM

## 2020-07-08 DIAGNOSIS — F17210 Nicotine dependence, cigarettes, uncomplicated: Secondary | ICD-10-CM

## 2020-07-08 DIAGNOSIS — E7800 Pure hypercholesterolemia, unspecified: Secondary | ICD-10-CM | POA: Diagnosis not present

## 2020-07-08 DIAGNOSIS — N1832 Chronic kidney disease, stage 3b: Secondary | ICD-10-CM

## 2020-07-08 DIAGNOSIS — E6609 Other obesity due to excess calories: Secondary | ICD-10-CM

## 2020-07-08 DIAGNOSIS — C642 Malignant neoplasm of left kidney, except renal pelvis: Secondary | ICD-10-CM | POA: Diagnosis not present

## 2020-07-08 DIAGNOSIS — Z6832 Body mass index (BMI) 32.0-32.9, adult: Secondary | ICD-10-CM

## 2020-07-08 DIAGNOSIS — I1310 Hypertensive heart and chronic kidney disease without heart failure, with stage 1 through stage 4 chronic kidney disease, or unspecified chronic kidney disease: Secondary | ICD-10-CM

## 2020-07-08 NOTE — Patient Instructions (Signed)
Chronic Obstructive Pulmonary Disease Chronic obstructive pulmonary disease (COPD) is a long-term (chronic) lung problem. When you have COPD, it is hard for air to get in and out of your lungs. Usually the condition gets worse over time, and your lungs will never return to normal. There are things you can do to keep yourself as healthy as possible.  Your doctor may treat your condition with: ? Medicines. ? Oxygen. ? Lung surgery.  Your doctor may also recommend: ? Rehabilitation. This includes steps to make your body work better. It may involve a team of specialists. ? Quitting smoking, if you smoke. ? Exercise and changes to your diet. ? Comfort measures (palliative care). Follow these instructions at home: Medicines  Take over-the-counter and prescription medicines only as told by your doctor.  Talk to your doctor before taking any cough or allergy medicines. You may need to avoid medicines that cause your lungs to be dry. Lifestyle  If you smoke, stop. Smoking makes the problem worse. If you need help quitting, ask your doctor.  Avoid being around things that make your breathing worse. This may include smoke, chemicals, and fumes.  Stay active, but remember to rest as well.  Learn and use tips on how to relax.  Make sure you get enough sleep. Most adults need at least 7 hours of sleep every night.  Eat healthy foods. Eat smaller meals more often. Rest before meals. Controlled breathing Learn and use tips on how to control your breathing as told by your doctor. Try:  Breathing in (inhaling) through your nose for 1 second. Then, pucker your lips and breath out (exhale) through your lips for 2 seconds.  Putting one hand on your belly (abdomen). Breathe in slowly through your nose for 1 second. Your hand on your belly should move out. Pucker your lips and breathe out slowly through your lips. Your hand on your belly should move in as you breathe out.  Controlled coughing Learn  and use controlled coughing to clear mucus from your lungs. Follow these steps: 1. Lean your head a little forward. 2. Breathe in deeply. 3. Try to hold your breath for 3 seconds. 4. Keep your mouth slightly open while coughing 2 times. 5. Spit any mucus out into a tissue. 6. Rest and do the steps again 1 or 2 times as needed. General instructions  Make sure you get all the shots (vaccines) that your doctor recommends. Ask your doctor about a flu shot and a pneumonia shot.  Use oxygen therapy and pulmonary rehabilitation if told by your doctor. If you need home oxygen therapy, ask your doctor if you should buy a tool to measure your oxygen level (oximeter).  Make a COPD action plan with your doctor. This helps you to know what to do if you feel worse than usual.  Manage any other conditions you have as told by your doctor.  Avoid going outside when it is very hot, cold, or humid.  Avoid people who have a sickness you can catch (contagious).  Keep all follow-up visits as told by your doctor. This is important. Contact a doctor if:  You cough up more mucus than usual.  There is a change in the color or thickness of the mucus.  It is harder to breathe than usual.  Your breathing is faster than usual.  You have trouble sleeping.  You need to use your medicines more often than usual.  You have trouble doing your normal activities such as getting dressed   or walking around the house. Get help right away if:  You have shortness of breath while resting.  You have shortness of breath that stops you from: ? Being able to talk. ? Doing normal activities.  Your chest hurts for longer than 5 minutes.  Your skin color is more blue than usual.  Your pulse oximeter shows that you have low oxygen for longer than 5 minutes.  You have a fever.  You feel too tired to breathe normally. Summary  Chronic obstructive pulmonary disease (COPD) is a long-term lung problem.  The way your  lungs work will never return to normal. Usually the condition gets worse over time. There are things you can do to keep yourself as healthy as possible.  Take over-the-counter and prescription medicines only as told by your doctor.  If you smoke, stop. Smoking makes the problem worse. This information is not intended to replace advice given to you by your health care provider. Make sure you discuss any questions you have with your health care provider. Document Revised: 10/29/2017 Document Reviewed: 12/21/2016 Elsevier Patient Education  2020 Elsevier Inc.  

## 2020-07-08 NOTE — Progress Notes (Addendum)
BP 117/70   Pulse 67   Temp 98.2 F (36.8 C) (Oral)   Wt 209 lb (94.8 kg)   SpO2 97%   BMI 32.73 kg/m    Subjective:    Patient ID: Sarah [name], female    DOB: Sep 29, 1951, 69 y.o.   MRN: [id number]  HPI: Sarah [name] is a 69 y.o. female  Chief Complaint  Patient presents with  . Hypertension  . Hyperlipidemia  . Depression   HYPERTENSION / HYPERLIPIDEMIA/CKD Continues on Amlodipine, Atenolol, Benazepril-HCTZ. Recent labs 04/10/20 with CRT 1.67 and GFR 36, BUN 40, PTH 60, she is followed by Dr. Candiss Norse and last saw on 04/10/20. Continues on Atorvastatin.   Satisfied with current treatment? yes Duration of hypertension: chronic BP monitoring frequency: rarely BP range: 120-130/80 --- and sometimes higher depending on what she eats BP medication side effects: no Duration of hyperlipidemia: chronic Cholesterol medication side effects: no Cholesterol supplements: none Medication compliance: good compliance Aspirin: no Recent stressors: no Recurrent headaches: no Visual changes: no Palpitations: no Dyspnea: no Chest pain: no Lower extremity edema: no Dizzy/lightheaded: no   COPD Last lung CA screening on 06/05/20 noting centrilobular emphysema and aortic atherosclerosis.  No current inhalers.  Is a current every day smoker, about 1/2 PPD.  Has smoked since her teenage years.   COPD status: stable Satisfied with current treatment?: yes Oxygen use: no Dyspnea frequency:  Cough frequency:  Rescue inhaler frequency:   Limitation of activity: no Productive cough:  Last Spirometry:  Pneumovax: Up to Date Influenza: Up to Date  DEPRESSION Continues on Celexa 20 MG. Mood status: controlled Satisfied with current treatment?: yes Symptom severity: mild  Duration of current treatment : chronic Side effects: no Medication compliance: excellent compliance Psychotherapy/counseling: none Anxious mood: no Anhedonia: no Significant weight loss or gain: no Insomnia:  none Fatigue: no Feelings of worthlessness or guilt: no Impaired concentration/indecisiveness: no Suicidal ideations: no Hopelessness: no Crying spells: no Depression screen [facility] 2/9 07/08/2020 12/29/2019 06/28/2019 05/24/2019 12/28/2018  Decreased Interest 0 0 0 0 0  Down, Depressed, Hopeless 0 0 0 0 0  PHQ - 2 Score 0 0 0 0 0  Altered sleeping 0 0 0 - 0  Tired, decreased energy 0 0 1 - 0  Change in appetite 1 0 1 - 0  Feeling bad or failure about yourself  0 0 0 - 0  Trouble concentrating 0 0 0 - 0  Moving slowly or fidgety/restless 0 0 0 - 0  Suicidal thoughts 0 0 0 - 0  PHQ-9 Score 1 0 2 - 0  Difficult doing work/chores Not difficult at all Not difficult at all Not difficult at all - Not difficult at all    RENAL CANCER STAGE III: Followed by Dr. Grayland Ormond and urology.  Had left nephrectomy on January 01, 2017.  She will require abdominal/pelvic CT scan approximately every 6 months for 3 years after her nephrectomy.  Relevant past medical, surgical, family and social history reviewed and updated as indicated. Interim medical history since our last visit reviewed. Allergies and medications reviewed and updated.  Review of Systems  Constitutional: Negative for activity change, appetite change, diaphoresis, fatigue and fever.  Respiratory: Negative for cough, chest tightness and shortness of breath.   Cardiovascular: Negative for chest pain, palpitations and leg swelling.  Gastrointestinal: Negative for abdominal distention, abdominal pain, constipation, diarrhea, nausea and vomiting.  Neurological: Negative for dizziness, syncope, weakness, light-headedness, numbness and headaches.  Psychiatric/Behavioral: Negative.  Per HPI unless specifically indicated above     Objective:    BP 117/70   Pulse 67   Temp 98.2 F (36.8 C) (Oral)   Wt 209 lb (94.8 kg)   SpO2 97%   BMI 32.73 kg/m   Wt Readings from Last 3 Encounters:  07/08/20 209 lb (94.8 kg)  06/11/20 209 lb 6.4 oz  (95 kg)  06/05/20 212 lb (96.2 kg)    Physical Exam Vitals and nursing note reviewed.  Constitutional:      General: She is awake. She is not in acute distress.    Appearance: She is well-developed. She is obese. She is not ill-appearing.  HENT:     Head: Normocephalic.     Right Ear: Hearing normal.     Left Ear: Hearing normal.     Nose: Nose normal.     Mouth/Throat:     Mouth: Mucous membranes are moist.  Eyes:     General: Lids are normal.        Right eye: No discharge.        Left eye: No discharge.     Conjunctiva/sclera: Conjunctivae normal.     Pupils: Pupils are equal, round, and reactive to light.  Neck:     Vascular: No carotid bruit.  Cardiovascular:     Rate and Rhythm: Normal rate and regular rhythm.     Heart sounds: Normal heart sounds. No murmur. No gallop.   Pulmonary:     Effort: Pulmonary effort is normal.     Breath sounds: Normal breath sounds.  Abdominal:     General: Bowel sounds are normal.     Palpations: Abdomen is soft.  Musculoskeletal:     Cervical back: Normal range of motion and neck supple.     Right lower leg: No edema.     Left lower leg: No edema.  Skin:    General: Skin is warm and dry.  Neurological:     Mental Status: She is alert and oriented to person, place, and time.  Psychiatric:        Attention and Perception: Attention normal.        Mood and Affect: Mood normal.        Behavior: Behavior normal. Behavior is cooperative.        Thought Content: Thought content normal.        Judgment: Judgment normal.    Results for orders placed or performed in visit on 01/03/20  Comprehensive metabolic panel  Result Value Ref Range   Sodium 141 135 - 145 mmol/L   Potassium 4.0 3.5 - 5.1 mmol/L   Chloride 112 (H) 98 - 111 mmol/L   CO2 21 (L) 22 - 32 mmol/L   Glucose, Bld 105 (H) 70 - 99 mg/dL   BUN 37 (H) 8 - 23 mg/dL   Creatinine, Ser 1.69 (H) 0.44 - 1.00 mg/dL   Calcium 9.1 8.9 - 10.3 mg/dL   Total Protein 7.3 6.5 - 8.1  g/dL   Albumin 3.7 3.5 - 5.0 g/dL   AST 18 15 - 41 U/L   ALT 14 0 - 44 U/L   Alkaline Phosphatase 67 38 - 126 U/L   Total Bilirubin 0.5 0.3 - 1.2 mg/dL   GFR calc non Af Amer 31 (L) >60 mL/min   GFR calc Af Amer 36 (L) >60 mL/min   Anion gap 8 5 - 15  CBC  Result Value Ref Range   WBC 6.3 4.0 - 10.5 K/uL  RBC 3.63 (L) 3.87 - 5.11 MIL/uL   Hemoglobin 10.7 (L) 12.0 - 15.0 g/dL   HCT 33.2 (L) 36 - 46 %   MCV 91.5 80.0 - 100.0 fL   MCH 29.5 26.0 - 34.0 pg   MCHC 32.2 30.0 - 36.0 g/dL   RDW 14.4 11.5 - 15.5 %   Platelets 281 150 - 400 K/uL   nRBC 0.0 0.0 - 0.2 %      Assessment & Plan:   Problem List Items Addressed This Visit      Cardiovascular and Mediastinum   Hypertensive heart/kidney disease without HF and with CKD stage III    Chronic, ongoing and followed by nephrology. BP at goal today, recommend she continue to check at home and monitor diet, DASH diet. Continue current medication regimen and adjust as needed + collaboration with nephrology and CA team.  Return in 6 months.        Aortic atherosclerosis ([place])    Noted on lung CT screening.  Continue statin daily and recommend modest weight loss + focus on healthy diet.  Complete cessation of smoking recommended.        Respiratory   Pulmonary emphysema ([facility])    Chronic, stable without inhalers at this time.  Recommend complete cessation of smoking.  Plan on spirometry at next visit.  Continue yearly lung screening.        Genitourinary   Renal cancer, left ([place]) - Primary    Followed by Dr. Grayland Ormond.  Continue this collaboration and review of notes.      Chronic kidney disease, stage 3    Chronic, ongoing.  Recent labs obtained with nephrology.  Continue current medication regimen and collaboration with nephrology.        Other   Nicotine dependence, cigarettes, uncomplicated    I have recommended complete cessation of tobacco use. I have discussed various options available for assistance with tobacco  cessation including over the counter methods (Nicotine gum, patch and lozenges). We also discussed prescription options (Chantix, Nicotine Inhaler / Nasal Spray). The patient is not interested in pursuing any prescription tobacco cessation options at this time.       Hyperlipidemia    Chronic, ongoing.  Continue current medication regimen and adjust as needed.  Lipid panel today.        Relevant Orders   Lipid Panel w/o Chol/HDL Ratio   Depression    Chronic, ongoing.  Continue current medication regimen and adjust as needed.  Denies SI/HI.        Obesity    BMI 32.73 today.  Recommended eating smaller high protein, low fat meals more frequently and exercising 30 mins a day 5 times a week with a goal of 10-15lb weight loss in the next 3 months. Patient voiced their understanding and motivation to adhere to these recommendations.           Follow up plan: Return in about 6 months (around 01/08/2021) for HTN/HLD, COPD, MOOD, CKD with spirometry.

## 2020-07-08 NOTE — Assessment & Plan Note (Signed)
BMI 32.73 today.  Recommended eating smaller high protein, low fat meals more frequently and exercising 30 mins a day 5 times a week with a goal of 10-15lb weight loss in the next 3 months. Patient voiced their understanding and motivation to adhere to these recommendations.

## 2020-07-08 NOTE — Assessment & Plan Note (Signed)
Noted on lung CT screening.  Continue statin daily and recommend modest weight loss + focus on healthy diet.  Complete cessation of smoking recommended. °

## 2020-07-08 NOTE — Assessment & Plan Note (Signed)
Followed by Dr. Grayland Ormond.  Continue this collaboration and review of notes.

## 2020-07-08 NOTE — Assessment & Plan Note (Signed)
Chronic, ongoing.  Continue current medication regimen and adjust as needed.  Denies SI/HI.

## 2020-07-08 NOTE — Assessment & Plan Note (Signed)
Chronic, ongoing and followed by nephrology. BP at goal today, recommend she continue to check at home and monitor diet, DASH diet. Continue current medication regimen and adjust as needed + collaboration with nephrology and CA team.  Return in 6 months.

## 2020-07-08 NOTE — Assessment & Plan Note (Signed)
Chronic, ongoing.  Recent labs obtained with nephrology.  Continue current medication regimen and collaboration with nephrology.

## 2020-07-08 NOTE — Assessment & Plan Note (Signed)
Chronic, ongoing.  Continue current medication regimen and adjust as needed. Lipid panel today. 

## 2020-07-08 NOTE — Assessment & Plan Note (Signed)
I have recommended complete cessation of tobacco use. I have discussed various options available for assistance with tobacco cessation including over the counter methods (Nicotine gum, patch and lozenges). We also discussed prescription options (Chantix, Nicotine Inhaler / Nasal Spray). The patient is not interested in pursuing any prescription tobacco cessation options at this time.  

## 2020-07-08 NOTE — Assessment & Plan Note (Signed)
Chronic, stable without inhalers at this time.  Recommend complete cessation of smoking.  Plan on spirometry at next visit.  Continue yearly lung screening.

## 2020-07-09 LAB — LIPID PANEL W/O CHOL/HDL RATIO
Cholesterol, Total: 113 mg/dL (ref 100–199)
HDL: 42 mg/dL (ref >39)
LDL Chol Calc (NIH): 56 mg/dL (ref 0–99)
Triglycerides: 74 mg/dL (ref 0–149)
VLDL Cholesterol Cal: 15 mg/dL (ref 5–40)

## 2020-07-09 NOTE — Progress Notes (Signed)
Contacted via MyChart  Good morning Lizzy, your cholesterol levels are still amazing.  Keep up the good work!! Keep being awesome!! Kindest regards, Dior Dominik

## 2020-07-29 DIAGNOSIS — R1032 Left lower quadrant pain: Secondary | ICD-10-CM | POA: Diagnosis not present

## 2020-07-29 DIAGNOSIS — R197 Diarrhea, unspecified: Secondary | ICD-10-CM | POA: Diagnosis not present

## 2020-07-29 DIAGNOSIS — R110 Nausea: Secondary | ICD-10-CM | POA: Diagnosis not present

## 2020-07-29 DIAGNOSIS — R1031 Right lower quadrant pain: Secondary | ICD-10-CM | POA: Diagnosis not present

## 2020-07-29 DIAGNOSIS — R109 Unspecified abdominal pain: Secondary | ICD-10-CM | POA: Diagnosis not present

## 2020-08-19 ENCOUNTER — Telehealth: Payer: Self-pay | Admitting: Nurse Practitioner

## 2020-08-19 NOTE — Telephone Encounter (Signed)
Copied from [place] [id number]. Topic: General - Other >> Aug 19, 2020 11:46 AM Rainey Pines A wrote: Rob Hickman is requesting patients colonoscopy records to be faxed to [phone]. Please advise

## 2020-08-20 NOTE — Telephone Encounter (Signed)
[facility] returned my call and was notified about colonoscopy.

## 2020-08-20 NOTE — Telephone Encounter (Signed)
Called [facility] back at the number provided. Cannot pull colonoscopy from the old system as it will not come up. LVM asking for someone to please return my call.

## 2020-09-02 ENCOUNTER — Other Ambulatory Visit: Payer: Self-pay | Admitting: Nurse Practitioner

## 2020-10-15 DIAGNOSIS — N281 Cyst of kidney, acquired: Secondary | ICD-10-CM | POA: Diagnosis not present

## 2020-10-15 DIAGNOSIS — N390 Urinary tract infection, site not specified: Secondary | ICD-10-CM | POA: Diagnosis not present

## 2020-10-15 DIAGNOSIS — N1832 Chronic kidney disease, stage 3b: Secondary | ICD-10-CM | POA: Diagnosis not present

## 2020-10-15 DIAGNOSIS — R8290 Unspecified abnormal findings in urine: Secondary | ICD-10-CM | POA: Diagnosis not present

## 2020-10-15 DIAGNOSIS — Z905 Acquired absence of kidney: Secondary | ICD-10-CM | POA: Diagnosis not present

## 2020-10-15 DIAGNOSIS — I10 Essential (primary) hypertension: Secondary | ICD-10-CM | POA: Diagnosis not present

## 2020-10-31 ENCOUNTER — Other Ambulatory Visit: Payer: Self-pay | Admitting: Nurse Practitioner

## 2020-10-31 NOTE — Telephone Encounter (Signed)
Requested medication (s) are due for refill today: no  Requested medication (s) are on the active medication list:yes  Last refill: 04/22/2020  Future visit scheduled:yes  Notes to clinic: this refill cannot be delegated    Requested Prescriptions  Pending Prescriptions Disp Refills   traMADol (ULTRAM) 50 MG tablet [Pharmacy Med Name: TRAMADOL HCL 50 MG TABLET] 14 tablet     Sig: Take 1 tablet (50 mg total) by mouth every 12 (twelve) hours as needed.      Not Delegated - Analgesics:  Opioid Agonists Failed - 10/31/2020 12:31 PM      Failed - This refill cannot be delegated      Failed - Urine Drug Screen completed in last 360 days      Passed - Valid encounter within last 6 months    Recent Outpatient Visits           3 months ago Renal cancer, left ([place])   [place] Cannady, Jolene T, NP   10 months ago Renal cancer, left ([place])   [place] Cannady, Jolene T, NP   1 year ago Class 2 severe obesity due to excess calories with serious comorbidity and body mass index (BMI) of 35.0 to 35.9 in adult [facility])   [place], Jolene T, NP   1 year ago Hypertensive heart/kidney disease without HF and with CKD stage III ([place])   [place], Jolene T, NP   2 years ago Chronic kidney disease, stage 3 ([place])   [place] Kathrine Haddock, NP       Future Appointments             In 2 months Cannady, Barbaraann Faster, NP [facility], [place]   In 7 months Hollice Espy, MD [place]

## 2020-10-31 NOTE — Telephone Encounter (Signed)
Routing to provider  

## 2020-11-04 DIAGNOSIS — R1084 Generalized abdominal pain: Secondary | ICD-10-CM | POA: Diagnosis not present

## 2020-11-04 DIAGNOSIS — R109 Unspecified abdominal pain: Secondary | ICD-10-CM | POA: Diagnosis not present

## 2020-11-04 DIAGNOSIS — R197 Diarrhea, unspecified: Secondary | ICD-10-CM | POA: Diagnosis not present

## 2020-11-04 DIAGNOSIS — N390 Urinary tract infection, site not specified: Secondary | ICD-10-CM | POA: Diagnosis not present

## 2020-11-08 DIAGNOSIS — R197 Diarrhea, unspecified: Secondary | ICD-10-CM | POA: Diagnosis not present

## 2020-11-08 DIAGNOSIS — R1084 Generalized abdominal pain: Secondary | ICD-10-CM | POA: Diagnosis not present

## 2020-11-11 ENCOUNTER — Ambulatory Visit: Payer: BC Managed Care – PPO | Admitting: Nurse Practitioner

## 2020-12-03 ENCOUNTER — Ambulatory Visit: Payer: BC Managed Care – PPO | Admitting: Nurse Practitioner

## 2020-12-04 ENCOUNTER — Other Ambulatory Visit: Payer: Self-pay | Admitting: Nurse Practitioner

## 2020-12-04 NOTE — Telephone Encounter (Signed)
Routing to provider  

## 2020-12-05 ENCOUNTER — Other Ambulatory Visit: Payer: Self-pay | Admitting: Nurse Practitioner

## 2020-12-19 DIAGNOSIS — Z01818 Encounter for other preprocedural examination: Secondary | ICD-10-CM | POA: Diagnosis not present

## 2020-12-24 DIAGNOSIS — D120 Benign neoplasm of cecum: Secondary | ICD-10-CM | POA: Diagnosis not present

## 2020-12-24 DIAGNOSIS — K640 First degree hemorrhoids: Secondary | ICD-10-CM | POA: Diagnosis not present

## 2020-12-24 DIAGNOSIS — K635 Polyp of colon: Secondary | ICD-10-CM | POA: Diagnosis not present

## 2020-12-24 DIAGNOSIS — D122 Benign neoplasm of ascending colon: Secondary | ICD-10-CM | POA: Diagnosis not present

## 2020-12-24 DIAGNOSIS — K621 Rectal polyp: Secondary | ICD-10-CM | POA: Diagnosis not present

## 2020-12-24 DIAGNOSIS — Z1211 Encounter for screening for malignant neoplasm of colon: Secondary | ICD-10-CM | POA: Diagnosis not present

## 2020-12-24 LAB — HM COLONOSCOPY

## 2020-12-26 DIAGNOSIS — N281 Cyst of kidney, acquired: Secondary | ICD-10-CM | POA: Diagnosis not present

## 2020-12-26 DIAGNOSIS — Z905 Acquired absence of kidney: Secondary | ICD-10-CM | POA: Diagnosis not present

## 2020-12-26 DIAGNOSIS — I10 Essential (primary) hypertension: Secondary | ICD-10-CM | POA: Diagnosis not present

## 2020-12-26 DIAGNOSIS — N1832 Chronic kidney disease, stage 3b: Secondary | ICD-10-CM | POA: Diagnosis not present

## 2021-01-08 ENCOUNTER — Ambulatory Visit (INDEPENDENT_AMBULATORY_CARE_PROVIDER_SITE_OTHER): Payer: BC Managed Care – PPO | Admitting: Nurse Practitioner

## 2021-01-08 ENCOUNTER — Other Ambulatory Visit: Payer: Self-pay

## 2021-01-08 ENCOUNTER — Encounter: Payer: Self-pay | Admitting: Nurse Practitioner

## 2021-01-08 VITALS — BP 132/77 | HR 62 | Temp 98.0°F | Wt 202.0 lb

## 2021-01-08 DIAGNOSIS — C642 Malignant neoplasm of left kidney, except renal pelvis: Secondary | ICD-10-CM | POA: Diagnosis not present

## 2021-01-08 DIAGNOSIS — E6609 Other obesity due to excess calories: Secondary | ICD-10-CM

## 2021-01-08 DIAGNOSIS — R69 Illness, unspecified: Secondary | ICD-10-CM | POA: Diagnosis not present

## 2021-01-08 DIAGNOSIS — F17210 Nicotine dependence, cigarettes, uncomplicated: Secondary | ICD-10-CM

## 2021-01-08 DIAGNOSIS — F324 Major depressive disorder, single episode, in partial remission: Secondary | ICD-10-CM

## 2021-01-08 DIAGNOSIS — J432 Centrilobular emphysema: Secondary | ICD-10-CM | POA: Diagnosis not present

## 2021-01-08 DIAGNOSIS — E7800 Pure hypercholesterolemia, unspecified: Secondary | ICD-10-CM | POA: Diagnosis not present

## 2021-01-08 DIAGNOSIS — N1832 Chronic kidney disease, stage 3b: Secondary | ICD-10-CM | POA: Diagnosis not present

## 2021-01-08 DIAGNOSIS — I1310 Hypertensive heart and chronic kidney disease without heart failure, with stage 1 through stage 4 chronic kidney disease, or unspecified chronic kidney disease: Secondary | ICD-10-CM

## 2021-01-08 DIAGNOSIS — I700 Atherosclerosis of aorta: Secondary | ICD-10-CM

## 2021-01-08 DIAGNOSIS — Z6831 Body mass index (BMI) 31.0-31.9, adult: Secondary | ICD-10-CM | POA: Diagnosis not present

## 2021-01-08 MED ORDER — AMLODIPINE BESYLATE 5 MG PO TABS: 5.0000 mg | ORAL_TABLET | Freq: Every day | ORAL | 4 refills | Status: DC

## 2021-01-08 MED ORDER — BENAZEPRIL-HYDROCHLOROTHIAZIDE 20-25 MG PO TABS: 1.0000 | ORAL_TABLET | Freq: Every day | ORAL | 4 refills | Status: DC

## 2021-01-08 MED ORDER — ATENOLOL 100 MG PO TABS: 100.0000 mg | ORAL_TABLET | Freq: Every day | ORAL | 4 refills | Status: DC

## 2021-01-08 MED ORDER — CITALOPRAM HYDROBROMIDE 20 MG PO TABS: 20.0000 mg | ORAL_TABLET | Freq: Every day | ORAL | 4 refills | Status: DC

## 2021-01-08 NOTE — Assessment & Plan Note (Signed)
Followed by Dr. Grayland Ormond.  Continue this collaboration and review of notes.

## 2021-01-08 NOTE — Assessment & Plan Note (Signed)
I have recommended complete cessation of tobacco use. I have discussed various options available for assistance with tobacco cessation including over the counter methods (Nicotine gum, patch and lozenges). We also discussed prescription options (Chantix, Nicotine Inhaler / Nasal Spray). The patient is not interested in pursuing any prescription tobacco cessation options at this time.  

## 2021-01-08 NOTE — Patient Instructions (Addendum)
Sarcoidosis  Sarcoidosis is a disease that can cause inflammation in many areas of the body. It most often affects the lungs (pulmonary sarcoidosis). Sarcoidosis can also affect the lymph nodes, liver, eyes, skin, heart, or any other body tissue. Normally, cells that are part of the body's disease-fighting system (immune system) attack harmful substances in the body, such as germs. This immune system response causes inflammation. After the harmful substance is destroyed, the inflammation goes away. When you have sarcoidosis, your immune system causes inflammation even when there are no harmful substances, and the inflammation does not go away. Sarcoidosis also causes cells from your immune system to form small lumps (granulomas) in the affected area of your body. What are the causes? The exact cause of sarcoidosis is not known.  If you have a family history of this disease (genetic predisposition), the immune system response that leads to inflammation may be triggered by something in your environment, such as:  Bacteria or viruses.  Metals.  Chemicals.  Dust.  Mold or mildew. What increases the risk? You may be more likely to develop this condition if you:  Have a family history of the disease.  Are African American.  Are of Northern European descent.  Are 31-82 years old.  Are female.  Work as a Airline pilot.  Work in an environment where you are exposed to metals, chemicals, mold or mildew, or insecticides. What are the signs or symptoms? Some people with sarcoidosis have no symptoms. Others have very mild symptoms. The symptoms usually depend on the organ that is affected. Sarcoidosis most often affects the lungs, which may lead to symptoms such as:  Chest pain.  Coughing.  Wheezing.  Shortness of breath. Other common symptoms include:  Night sweats.  Fever.  Weight loss.  Tiredness (fatigue).  Swollen lymph nodes.  Joint pain. How is this diagnosed? This  condition may be diagnosed based on:  Your symptoms and medical history.  A physical exam.  Imaging tests such as: ? Chest X-ray. ? CT scan. ? MRI. ? PET scan.  Lung function tests. These tests evaluate your breathing and check for problems that may be related to sarcoidosis.  A procedure to remove a tissue sample for testing (biopsy). You may have a biopsy of lung tissue if that is where you are having symptoms. You may have tests to check for any complications of the condition. These tests may include:  Eye exams.  MRI of the heart or brain.  Echocardiogram.  ECG (electrocardiogram). How is this treated? In some cases, sarcoidosis does not require a specific treatment because it causes no symptoms or only mild symptoms. If your symptoms bother you or are severe, you may be prescribed medicines to reduce inflammation or relieve symptoms. These medicines may include:  Prednisone. This is a steroid that reduces inflammation related to sarcoidosis.  Hydroxychloroquine. This may be used to treat sarcoidosis that affects the skin, eyes, or brain.  Certain medicines that affect the immune system. These can help with sarcoidosis in the joints, eyes, skin, or lungs.  Medicines that you breathe in (inhalers). Inhalers can help you breathe if sarcoidosis affects your lungs. Follow these instructions at home:  Do not use any products that contain nicotine or tobacco. These products include cigarettes, chewing tobacco, and vaping devices, such as e-cigarettes. If you need help quitting, ask your health care provider.  Avoid secondhand smoke and irritating dust or chemicals. Stay indoors on days when air quality is poor in your area.  Return  to your normal activities as told by your health care provider. Ask your health care provider what activities are safe for you.  Take or use over-the-counter and prescription medicines only as told by your health care provider.  Keep all follow-up  visits. This is important.   Where to find more information National Heart, Lung, and Blood Institute: https://wilson-eaton.com/ Contact a health care provider if:  You have vision problems.  You have a dry cough that does not go away.  You have an irregular heartbeat.  You have pain or aches in your joints, hands, or feet.  You have an unexplained rash. Get help right away if:  You have chest pain.  You have trouble breathing. These symptoms may represent a serious problem that is an emergency. Do not wait to see if the symptoms will go away. Get medical help right away. Call your local emergency services (911 in the [place] Do not drive yourself to the hospital. Summary  Sarcoidosis is a disease that can cause inflammation in many body areas of the body. It most often affects the lungs (pulmonary sarcoidosis). It can also affect the lymph nodes, liver, eyes, skin, heart, or any other body tissue.  When you have sarcoidosis, cells from your immune system form small lumps (granulomas) in the affected area of your body.  Sarcoidosis sometimes does not require a specific treatment because it causes no symptoms or only mild symptoms.  If your symptoms bother you or are severe, you may be prescribed medicines to reduce inflammation or relieve symptoms. This information is not intended to replace advice given to you by your health care provider. Make sure you discuss any questions you have with your health care provider. Document Revised: 09/17/2020 Document Reviewed: 09/17/2020 Elsevier Patient Education  2021 [employer].

## 2021-01-08 NOTE — Assessment & Plan Note (Signed)
BMI 31.64 today.  Recommended eating smaller high protein, low fat meals more frequently and exercising 30 mins a day 5 times a week with a goal of 10-15lb weight loss in the next 3 months. Patient voiced their understanding and motivation to adhere to these recommendations.

## 2021-01-08 NOTE — Progress Notes (Signed)
BP 132/77   Pulse 62   Temp 98 F (36.7 C) (Oral)   Wt 202 lb (91.6 kg)   SpO2 96%   BMI 31.64 kg/m    Subjective:    Patient ID: Sarah [name], female    DOB: 1951-04-10, 70 y.o.   MRN: [id number]  HPI: Sarah [name] is a 70 y.o. female  Chief Complaint  Patient presents with  . Hypertension  . Follow-up   HYPERTENSION / HYPERLIPIDEMIA/CKD Continues on Amlodipine, Atenolol, Benazepril-HCTZ. Recent labs 12/26/20 with CRT 1.64 and GFR 32, BUN 26, PTH 45, she is followed by Dr. Candiss Norse and last saw on 12/26/20. Continues on Atorvastatin.    Reports her sister was just diagnosed with sarcoidosis. Satisfied with current treatment? yes Duration of hypertension: chronic BP monitoring frequency: rarely BP range: 120-130/80 BP medication side effects: no Duration of hyperlipidemia: chronic Cholesterol medication side effects: no Cholesterol supplements: none Medication compliance: good compliance Aspirin: no Recent stressors: no Recurrent headaches: no Visual changes: no Palpitations: no Dyspnea: no Chest pain: no Lower extremity edema: no Dizzy/lightheaded: no   COPD Last lung CA screening on 06/05/20 noting centrilobular emphysema and aortic atherosclerosis.  No current inhalers.  Is a current every day smoker, about 1/2 PPD.  Has smoked since her teenage years.   COPD status: stable Satisfied with current treatment?: yes Oxygen use: no Dyspnea frequency:  Cough frequency:  Rescue inhaler frequency:   Limitation of activity: no Productive cough:  Last Spirometry:  Pneumovax: Up to Date Influenza: Up to Date  DEPRESSION Continues on Celexa 20 MG. Mood status: controlled Satisfied with current treatment?: yes Symptom severity: mild  Duration of current treatment : chronic Side effects: no Medication compliance: excellent compliance Psychotherapy/counseling: none Anxious mood: no Anhedonia: no Significant weight loss or gain: no Insomnia: none Fatigue:  no Feelings of worthlessness or guilt: no Impaired concentration/indecisiveness: no Suicidal ideations: no Hopelessness: no Crying spells: no Depression screen [facility] 2/9 01/08/2021 07/08/2020 12/29/2019 06/28/2019 05/24/2019  Decreased Interest 0 0 0 0 0  Down, Depressed, Hopeless 0 0 0 0 0  PHQ - 2 Score 0 0 0 0 0  Altered sleeping 0 0 0 0 -  Tired, decreased energy 0 0 0 1 -  Change in appetite 0 1 0 1 -  Feeling bad or failure about yourself  0 0 0 0 -  Trouble concentrating 0 0 0 0 -  Moving slowly or fidgety/restless 0 0 0 0 -  Suicidal thoughts 0 0 0 0 -  PHQ-9 Score 0 1 0 2 -  Difficult doing work/chores Not difficult at all Not difficult at all Not difficult at all Not difficult at all -  Some recent data might be hidden    RENAL CANCER STAGE III: Followed by Dr. Grayland Ormond and urology.  Had left nephrectomy on January 01, 2017.  She will require abdominal/pelvic CT scan approximately every 6 months for 3 years after her nephrectomy -- has scheduled for tomorrow.  Relevant past medical, surgical, family and social history reviewed and updated as indicated. Interim medical history since our last visit reviewed. Allergies and medications reviewed and updated.  Review of Systems  Constitutional: Negative for activity change, appetite change, diaphoresis, fatigue and fever.  Respiratory: Negative for cough, chest tightness and shortness of breath.   Cardiovascular: Negative for chest pain, palpitations and leg swelling.  Gastrointestinal: Negative for abdominal distention, abdominal pain, constipation, diarrhea, nausea and vomiting.  Neurological: Negative for dizziness, syncope, weakness, light-headedness, numbness  and headaches.  Psychiatric/Behavioral: Negative.     Per HPI unless specifically indicated above     Objective:    BP 132/77   Pulse 62   Temp 98 F (36.7 C) (Oral)   Wt 202 lb (91.6 kg)   SpO2 96%   BMI 31.64 kg/m   Wt Readings from Last 3 Encounters:   01/08/21 202 lb (91.6 kg)  07/08/20 209 lb (94.8 kg)  06/11/20 209 lb 6.4 oz (95 kg)    Physical Exam Vitals and nursing note reviewed.  Constitutional:      General: She is awake. She is not in acute distress.    Appearance: She is well-developed. She is obese. She is not ill-appearing.  HENT:     Head: Normocephalic.     Right Ear: Hearing normal.     Left Ear: Hearing normal.     Nose: Nose normal.     Mouth/Throat:     Mouth: Mucous membranes are moist.  Eyes:     General: Lids are normal.        Right eye: No discharge.        Left eye: No discharge.     Conjunctiva/sclera: Conjunctivae normal.     Pupils: Pupils are equal, round, and reactive to light.  Neck:     Vascular: No carotid bruit.  Cardiovascular:     Rate and Rhythm: Normal rate and regular rhythm.     Heart sounds: Normal heart sounds. No murmur. No gallop.   Pulmonary:     Effort: Pulmonary effort is normal.     Breath sounds: Normal breath sounds.  Abdominal:     General: Bowel sounds are normal.     Palpations: Abdomen is soft.  Musculoskeletal:     Cervical back: Normal range of motion and neck supple.     Right lower leg: No edema.     Left lower leg: No edema.  Skin:    General: Skin is warm and dry.  Neurological:     Mental Status: She is alert and oriented to person, place, and time.  Psychiatric:        Attention and Perception: Attention normal.        Mood and Affect: Mood normal.        Behavior: Behavior normal. Behavior is cooperative.        Thought Content: Thought content normal.        Judgment: Judgment normal.    Results for orders placed or performed in visit on 07/08/20  Lipid Panel w/o Chol/HDL Ratio  Result Value Ref Range   Cholesterol, Total 113 100 - 199 mg/dL   Triglycerides 74 0 - 149 mg/dL   HDL 42 >39 mg/dL   VLDL Cholesterol Cal 15 5 - 40 mg/dL   LDL Chol Calc (NIH) 56 0 - 99 mg/dL      Assessment & Plan:   Problem List Items Addressed This Visit       Cardiovascular and Mediastinum   Hypertensive heart/kidney disease without HF and with CKD stage III (HCC)    Chronic, ongoing and followed by nephrology. BP at goal today, recommend she continue to check at home and monitor diet, DASH diet. Continue current medication regimen and adjust as needed + collaboration with nephrology and CA team.  Refills sent in.  Recent labs with nephrology obtained and reviewed today.  Return in 6 months.        Relevant Medications   amLODipine (NORVASC) 5 MG tablet  atenolol (TENORMIN) 100 MG tablet   benazepril-hydrochlorthiazide (LOTENSIN HCT) 20-25 MG tablet   Aortic atherosclerosis ([place])    Noted on lung CT screening.  Continue statin daily and recommend modest weight loss + focus on healthy diet.  Complete cessation of smoking recommended.      Relevant Medications   amLODipine (NORVASC) 5 MG tablet   atenolol (TENORMIN) 100 MG tablet   benazepril-hydrochlorthiazide (LOTENSIN HCT) 20-25 MG tablet     Respiratory   Pulmonary emphysema (HCC)    Chronic, stable without inhalers at this time.  Recommend complete cessation of smoking.  Plan on spirometry at next visit.  Continue yearly lung screening.        Genitourinary   Renal cancer, left ([place]) - Primary    Followed by Dr. Grayland Ormond.  Continue this collaboration and review of notes.      Chronic kidney disease, stage 3 (HCC)    Chronic, ongoing.  Recent labs obtained with nephrology.  Continue current medication regimen and collaboration with nephrology.   Recent labs with nephrology obtained and reviewed today.          Other   Nicotine dependence, cigarettes, uncomplicated    I have recommended complete cessation of tobacco use. I have discussed various options available for assistance with tobacco cessation including over the counter methods (Nicotine gum, patch and lozenges). We also discussed prescription options (Chantix, Nicotine Inhaler / Nasal Spray). The patient is not interested in  pursuing any prescription tobacco cessation options at this time.       Hyperlipidemia    Chronic, ongoing.  Continue current medication regimen and adjust as needed.  Lipid panel today.        Relevant Medications   amLODipine (NORVASC) 5 MG tablet   atenolol (TENORMIN) 100 MG tablet   benazepril-hydrochlorthiazide (LOTENSIN HCT) 20-25 MG tablet   Depression    Chronic, ongoing.  Continue current medication regimen and adjust as needed.  Refills sent in.  Denies SI/HI.        Relevant Medications   citalopram (CELEXA) 20 MG tablet   Obesity    BMI 31.64 today.  Recommended eating smaller high protein, low fat meals more frequently and exercising 30 mins a day 5 times a week with a goal of 10-15lb weight loss in the next 3 months. Patient voiced their understanding and motivation to adhere to these recommendations.           Follow up plan: Return in about 6 months (around 07/08/2021) for CKD, RENAL CANCER, MOOD, COPD, HTN/HLD.

## 2021-01-08 NOTE — Assessment & Plan Note (Signed)
Chronic, stable without inhalers at this time.  Recommend complete cessation of smoking.  Plan on spirometry at next visit.  Continue yearly lung screening.

## 2021-01-08 NOTE — Assessment & Plan Note (Addendum)
Chronic, ongoing.  Recent labs obtained with nephrology.  Continue current medication regimen and collaboration with nephrology.   Recent labs with nephrology obtained and reviewed today.

## 2021-01-08 NOTE — Assessment & Plan Note (Signed)
Chronic, ongoing.  Continue current medication regimen and adjust as needed.  Refills sent in.  Denies SI/HI.

## 2021-01-08 NOTE — Assessment & Plan Note (Signed)
Chronic, ongoing and followed by nephrology. BP at goal today, recommend she continue to check at home and monitor diet, DASH diet. Continue current medication regimen and adjust as needed + collaboration with nephrology and CA team.  Refills sent in.  Recent labs with nephrology obtained and reviewed today.  Return in 6 months.

## 2021-01-08 NOTE — Assessment & Plan Note (Signed)
Chronic, ongoing.  Continue current medication regimen and adjust as needed. Lipid panel today. 

## 2021-01-08 NOTE — Assessment & Plan Note (Signed)
Noted on lung CT screening.  Continue statin daily and recommend modest weight loss + focus on healthy diet.  Complete cessation of smoking recommended. °

## 2021-01-09 ENCOUNTER — Inpatient Hospital Stay: Payer: BC Managed Care – PPO | Attending: Oncology

## 2021-01-09 ENCOUNTER — Ambulatory Visit
Admission: RE | Admit: 2021-01-09 | Discharge: 2021-01-09 | Disposition: A | Discharge status: Home / Self Care | Payer: BC Managed Care – PPO | Source: Ambulatory Visit | Attending: Oncology | Admitting: Oncology

## 2021-01-09 ENCOUNTER — Other Ambulatory Visit: Payer: Self-pay

## 2021-01-09 DIAGNOSIS — I700 Atherosclerosis of aorta: Secondary | ICD-10-CM | POA: Diagnosis not present

## 2021-01-09 DIAGNOSIS — N189 Chronic kidney disease, unspecified: Secondary | ICD-10-CM | POA: Diagnosis not present

## 2021-01-09 DIAGNOSIS — Z79899 Other long term (current) drug therapy: Secondary | ICD-10-CM | POA: Diagnosis not present

## 2021-01-09 DIAGNOSIS — Z803 Family history of malignant neoplasm of breast: Secondary | ICD-10-CM | POA: Insufficient documentation

## 2021-01-09 DIAGNOSIS — C642 Malignant neoplasm of left kidney, except renal pelvis: Secondary | ICD-10-CM | POA: Diagnosis not present

## 2021-01-09 DIAGNOSIS — K8020 Calculus of gallbladder without cholecystitis without obstruction: Secondary | ICD-10-CM | POA: Diagnosis not present

## 2021-01-09 DIAGNOSIS — Z8249 Family history of ischemic heart disease and other diseases of the circulatory system: Secondary | ICD-10-CM | POA: Insufficient documentation

## 2021-01-09 DIAGNOSIS — R69 Illness, unspecified: Secondary | ICD-10-CM | POA: Diagnosis not present

## 2021-01-09 DIAGNOSIS — I10 Essential (primary) hypertension: Secondary | ICD-10-CM | POA: Diagnosis not present

## 2021-01-09 DIAGNOSIS — E669 Obesity, unspecified: Secondary | ICD-10-CM | POA: Diagnosis not present

## 2021-01-09 DIAGNOSIS — D631 Anemia in chronic kidney disease: Secondary | ICD-10-CM | POA: Diagnosis not present

## 2021-01-09 DIAGNOSIS — F17210 Nicotine dependence, cigarettes, uncomplicated: Secondary | ICD-10-CM | POA: Insufficient documentation

## 2021-01-09 DIAGNOSIS — Z905 Acquired absence of kidney: Secondary | ICD-10-CM | POA: Insufficient documentation

## 2021-01-09 DIAGNOSIS — Z90710 Acquired absence of both cervix and uterus: Secondary | ICD-10-CM | POA: Insufficient documentation

## 2021-01-09 DIAGNOSIS — Z85528 Personal history of other malignant neoplasm of kidney: Secondary | ICD-10-CM | POA: Diagnosis not present

## 2021-01-09 DIAGNOSIS — Z8041 Family history of malignant neoplasm of ovary: Secondary | ICD-10-CM | POA: Diagnosis not present

## 2021-01-09 DIAGNOSIS — N281 Cyst of kidney, acquired: Secondary | ICD-10-CM | POA: Diagnosis not present

## 2021-01-09 DIAGNOSIS — Z8042 Family history of malignant neoplasm of prostate: Secondary | ICD-10-CM | POA: Insufficient documentation

## 2021-01-09 LAB — CBC
HCT: 30.1 % — ABNORMAL LOW (ref 36.0–46.0)
Hemoglobin: 10.1 g/dL — ABNORMAL LOW (ref 12.0–15.0)
MCH: 30.1 pg (ref 26.0–34.0)
MCHC: 33.6 g/dL (ref 30.0–36.0)
MCV: 89.6 fL (ref 80.0–100.0)
Platelets: 266 10*3/uL (ref 150–400)
RBC: 3.36 MIL/uL — ABNORMAL LOW (ref 3.87–5.11)
RDW: 15.1 % (ref 11.5–15.5)
WBC: 6.6 10*3/uL (ref 4.0–10.5)
nRBC: 0 % (ref 0.0–0.2)

## 2021-01-09 LAB — COMPREHENSIVE METABOLIC PANEL
ALT: 13 U/L (ref 0–44)
AST: 16 U/L (ref 15–41)
Albumin: 3.5 g/dL (ref 3.5–5.0)
Alkaline Phosphatase: 60 U/L (ref 38–126)
Anion gap: 9 (ref 5–15)
BUN: 40 mg/dL — ABNORMAL HIGH (ref 8–23)
CO2: 26 mmol/L (ref 22–32)
Calcium: 9.2 mg/dL (ref 8.9–10.3)
Chloride: 106 mmol/L (ref 98–111)
Creatinine, Ser: 1.83 mg/dL — ABNORMAL HIGH (ref 0.44–1.00)
GFR, Estimated: 30 mL/min — ABNORMAL LOW (ref >60)
Glucose, Bld: 90 mg/dL (ref 70–99)
Potassium: 4.2 mmol/L (ref 3.5–5.1)
Sodium: 141 mmol/L (ref 135–145)
Total Bilirubin: 0.4 mg/dL (ref 0.3–1.2)
Total Protein: 7.2 g/dL (ref 6.5–8.1)

## 2021-01-09 MED ORDER — IOHEXOL 300 MG/ML  SOLN
75.0000 mL | Freq: Once | INTRAMUSCULAR | Status: AC | PRN
  Administered 2021-01-09: 75 mL via INTRAVENOUS

## 2021-01-10 ENCOUNTER — Inpatient Hospital Stay (HOSPITAL_BASED_OUTPATIENT_CLINIC_OR_DEPARTMENT_OTHER): Payer: BC Managed Care – PPO | Admitting: Oncology

## 2021-01-10 VITALS — BP 148/67 | HR 72 | Temp 98.0°F | Resp 16 | Wt 204.6 lb

## 2021-01-10 DIAGNOSIS — Z803 Family history of malignant neoplasm of breast: Secondary | ICD-10-CM | POA: Diagnosis not present

## 2021-01-10 DIAGNOSIS — C642 Malignant neoplasm of left kidney, except renal pelvis: Secondary | ICD-10-CM | POA: Diagnosis not present

## 2021-01-10 DIAGNOSIS — D631 Anemia in chronic kidney disease: Secondary | ICD-10-CM | POA: Diagnosis not present

## 2021-01-10 DIAGNOSIS — Z8042 Family history of malignant neoplasm of prostate: Secondary | ICD-10-CM | POA: Diagnosis not present

## 2021-01-10 DIAGNOSIS — Z85528 Personal history of other malignant neoplasm of kidney: Secondary | ICD-10-CM | POA: Diagnosis not present

## 2021-01-10 DIAGNOSIS — R69 Illness, unspecified: Secondary | ICD-10-CM | POA: Diagnosis not present

## 2021-01-10 DIAGNOSIS — Z8249 Family history of ischemic heart disease and other diseases of the circulatory system: Secondary | ICD-10-CM | POA: Diagnosis not present

## 2021-01-10 DIAGNOSIS — I10 Essential (primary) hypertension: Secondary | ICD-10-CM | POA: Diagnosis not present

## 2021-01-10 DIAGNOSIS — Z905 Acquired absence of kidney: Secondary | ICD-10-CM | POA: Diagnosis not present

## 2021-01-10 DIAGNOSIS — Z90710 Acquired absence of both cervix and uterus: Secondary | ICD-10-CM | POA: Diagnosis not present

## 2021-01-10 DIAGNOSIS — Z8041 Family history of malignant neoplasm of ovary: Secondary | ICD-10-CM | POA: Diagnosis not present

## 2021-01-10 DIAGNOSIS — E669 Obesity, unspecified: Secondary | ICD-10-CM | POA: Diagnosis not present

## 2021-01-10 DIAGNOSIS — Z79899 Other long term (current) drug therapy: Secondary | ICD-10-CM | POA: Diagnosis not present

## 2021-01-10 DIAGNOSIS — N189 Chronic kidney disease, unspecified: Secondary | ICD-10-CM | POA: Diagnosis not present

## 2021-01-10 NOTE — Progress Notes (Signed)
[facility]  Telephone:([phone] [phone]) [phone]  ID: Sarah [name] OB: 1951/08/14  MR#: [id number]  FWY#:[id number]  Patient Care Team: Venita Lick, NP as PCP - General (Nurse Practitioner) Nickie Retort, MD as Consulting Physician (Urology) Lloyd Huger, MD as Consulting Physician (Oncology)   CHIEF COMPLAINT: Stage III renal cell carcinoma, left.  INTERVAL HISTORY: Patient returns to clinic today for further evaluation and discussion of her imaging results. She was last seen in clinic on 01/10/2020 to review most recent CT scan.   In interim, she continues to do well. She is followed by her PCP, urology and nephrology on a regular basis. She had an ultrasound of her kidneys on 06/05/2020 which showed numerous right renal cyst and a stable right adrenal nodule. No acute abnormalities. She denies any recent fevers or illness. She has a good appetite and denies weight loss.  She denies any chest pain, shortness of breath. She has no abdominal pain.  She denies any nausea, vomiting, constipation, or diarrhea. She has no urinary complaints.  Patient offers no specific complaints today.    REVIEW OF SYSTEMS:   Review of Systems  Constitutional: Negative.  Negative for fever, malaise/fatigue and weight loss.  Respiratory: Negative.  Negative for cough and shortness of breath.   Cardiovascular: Negative.  Negative for chest pain and leg swelling.  Gastrointestinal: Negative.  Negative for abdominal pain.  Genitourinary: Negative.  Negative for flank pain and hematuria.  Musculoskeletal: Negative.  Negative for back pain.  Skin: Negative.  Negative for rash.  Neurological: Negative.  Negative for sensory change, focal weakness and weakness.  Psychiatric/Behavioral: Negative.  The patient is not nervous/anxious.     As per HPI. Otherwise, a complete review of systems is negative.  PAST MEDICAL HISTORY: Past Medical History:  Diagnosis Date  .  Abnormal mammogram, unspecified   . Aneurysm ([place]) 2004   cerebral x  4, [facility]  . Anxiety   . Arthritis   . Benign neoplasm of breast 2012  . Breast complaint 2010  . Chronic kidney disease    Renal Cell Cancer  . Hypertension   . Obesity, unspecified   . Personal history of tobacco use, presenting hazards to health   . Renal cancer, left ([place]) 11/2016   Renal Cell with Left nephrectomy.  Marland Kitchen Special screening for malignant neoplasms, colon     PAST SURGICAL HISTORY: Past Surgical History:  Procedure Laterality Date  . ABDOMINAL HYSTERECTOMY  1993  . BREAST BIOPSY Right 2012   stereo bx showing a 71mm radial scar in an estimated 8 volume of tissue  . BREAST BIOPSY Right Apr 09, 2005   Fibrocystic changes, ductal adenosis, microcalcifications, focal stromal/epithelial proliferation, possible fibroadenoma type proliferation  . CEREBRAL ANEURYSM REPAIR  2004  . COLONOSCOPY  2008   Dr. Nicolasa Ducking  . EYE SURGERY    . FOOT SURGERY Right 2003  . LAPAROSCOPIC NEPHRECTOMY, HAND ASSISTED Left 01/01/2017   Procedure: HAND ASSISTED LAPAROSCOPIC NEPHRECTOMY;  Surgeon: Nickie Retort, MD;  Location: [facility] ORS;  Service: Urology;  Laterality: Left;  . TONSILLECTOMY  1997    FAMILY HISTORY: Family History  Problem Relation Age of Onset  . Cancer Cousin        maternal first cousin with breast cancer  . Cancer Other        colon and ovarian cancers, no relationship listed  . Cancer Father        prostate  . Hypertension Father   .  Hypertension Sister   . Hypertension Brother   . Hypertension Paternal Grandmother   . Heart disease Paternal Grandmother   . Hypertension Paternal Grandfather   . Heart disease Paternal Grandfather   . Breast cancer Neg Hx     ADVANCED DIRECTIVES (Y/N):  N  HEALTH MAINTENANCE: Social History   Tobacco Use  . Smoking status: Current Every Day Smoker    Packs/day: 0.25    Years: 40.00    Pack years: 10.00    Types: Cigarettes  . Smokeless  tobacco: Never Used  Vaping Use  . Vaping Use: Never used  Substance Use Topics  . Alcohol use: Yes    Comment: socially  . Drug use: No     Colonoscopy:  PAP:  Bone density:  Lipid panel:  Allergies  Allergen Reactions  . Codeine Itching    Current Outpatient Medications  Medication Sig Dispense Refill  . acetaminophen (TYLENOL) 325 MG tablet Take 650 mg by mouth every 6 (six) hours as needed for mild pain, moderate pain or headache.     Marland Kitchen amLODipine (NORVASC) 5 MG tablet Take 1 tablet (5 mg total) by mouth daily. 90 tablet 4  . atenolol (TENORMIN) 100 MG tablet Take 1 tablet (100 mg total) by mouth daily. 90 tablet 4  . atorvastatin (LIPITOR) 40 MG tablet TAKE 1 TABLET BY MOUTH EVERY DAY 90 tablet 4  . benazepril-hydrochlorthiazide (LOTENSIN HCT) 20-25 MG tablet Take 1 tablet by mouth daily. 90 tablet 4  . Cholecalciferol (VITAMIN D-3 PO) Take 600 Units by mouth daily.    . citalopram (CELEXA) 20 MG tablet Take 1 tablet (20 mg total) by mouth daily. 90 tablet 4  . Loperamide HCl (IMODIUM PO) Take by mouth. PRN    . Multiple Vitamin (MULTIVITAMIN) tablet Take 1 tablet by mouth daily.    . traMADol (ULTRAM) 50 MG tablet TAKE 1 TABLET (50 MG TOTAL) BY MOUTH EVERY 12 (TWELVE) HOURS AS NEEDED. 14 tablet 0   No current facility-administered medications for this visit.    OBJECTIVE: Vitals:   01/10/21 1054  BP: (!) 148/67  Pulse: 72  Resp: 16  Temp: 98 F (36.7 C)  SpO2: 99%     Body mass index is 32.04 kg/m.    ECOG FS:0 - Asymptomatic  Physical Exam Constitutional:      General: Vital signs are normal.     Appearance: Normal appearance.  HENT:     Head: Normocephalic and atraumatic.  Eyes:     Pupils: Pupils are equal, round, and reactive to light.  Cardiovascular:     Rate and Rhythm: Normal rate and regular rhythm.     Heart sounds: Normal heart sounds. No murmur heard.   Pulmonary:     Effort: Pulmonary effort is normal.     Breath sounds: Normal breath  sounds. No wheezing.  Abdominal:     General: Bowel sounds are normal. There is no distension.     Palpations: Abdomen is soft.     Tenderness: There is no abdominal tenderness.  Musculoskeletal:        General: No edema. Normal range of motion.     Cervical back: Normal range of motion.  Skin:    General: Skin is warm and dry.     Findings: No rash.  Neurological:     Mental Status: She is alert and oriented to person, place, and time.  Psychiatric:        Judgment: Judgment normal.  LAB RESULTS:  Lab Results  Component Value Date   NA 141 01/09/2021   K 4.2 01/09/2021   CL 106 01/09/2021   CO2 26 01/09/2021   GLUCOSE 90 01/09/2021   BUN 40 (H) 01/09/2021   CREATININE 1.83 (H) 01/09/2021   CALCIUM 9.2 01/09/2021   PROT 7.2 01/09/2021   ALBUMIN 3.5 01/09/2021   AST 16 01/09/2021   ALT 13 01/09/2021   ALKPHOS 60 01/09/2021   BILITOT 0.4 01/09/2021   GFRNONAA 30 (L) 01/09/2021   GFRAA 36 (L) 01/03/2020    Lab Results  Component Value Date   WBC 6.6 01/09/2021   NEUTROABS 3.6 05/17/2019   HGB 10.1 (L) 01/09/2021   HCT 30.1 (L) 01/09/2021   MCV 89.6 01/09/2021   PLT 266 01/09/2021     STUDIES: CT Abdomen W Contrast  Result Date: 01/10/2021 CLINICAL DATA:  Follow-up left RCC, status post nephrectomy EXAM: CT ABDOMEN WITH CONTRAST TECHNIQUE: Multidetector CT imaging of the abdomen was performed using the standard protocol following bolus administration of intravenous contrast. CONTRAST:  83mL OMNIPAQUE IOHEXOL 300 MG/ML SOLN, additional oral enteric contrast COMPARISON:  01/03/2020, 05/24/2019 FINDINGS: Lower chest: No acute abnormality. Hepatobiliary: No focal liver abnormality is seen. Rim calcified gallstone in the gallbladder. No gallbladder wall thickening, or biliary dilatation. Pancreas: Unremarkable. No pancreatic ductal dilatation or surrounding inflammatory changes. Spleen: Normal in size without focal abnormality. Adrenals/Urinary Tract: Redemonstrated  postoperative findings status post left nephrectomy. No evidence of recurrent mass or abnormal contrast enhancement. There are numerous right-sided renal cysts and lesions of varying attenuation, mostly exophytic, not significantly changed in appearance compared to prior examination. No hydronephrosis. Stomach/Bowel: Stomach is within normal limits. Appendix appears normal. No evidence of bowel wall thickening, distention, or inflammatory changes. Vascular/Lymphatic: Aortic atherosclerosis. No enlarged abdominal lymph nodes. Other: No abdominal wall hernia or abnormality. No abdominopelvic ascites. Musculoskeletal: No acute or significant osseous findings. IMPRESSION: 1. Redemonstrated postoperative findings status post left nephrectomy without evidence of recurrent mass or suspicious contrast enhancement. No evidence of lymphadenopathy or metastatic disease in the abdomen. 2. There are numerous right-sided renal cysts and lesions of varying attenuation, mostly exophytic, not significantly changed in appearance compared to prior examination. These are most likely hemorrhagic or proteinaceous cysts but incompletely characterized by single phase examination; in general multiphasic contrast enhanced CT is strongly preferred for characterization of renal lesions and exclusion of renal cell carcinoma. 3. Cholelithiasis. Aortic Atherosclerosis (ICD10-I70.0). Electronically Signed   By: Eddie Candle M.D.   On: 01/10/2021 08:47    ASSESSMENT: Stage III renal cell carcinoma, left.  PLAN:  1. Stage III renal cell carcinoma, left: -Had nephrectomy on January 01, 2017.  -CT scan results from 01/03/2020 showed no obvious evidence of recurrent or progressive disease.   -Repeat CT scan from 01/09/2021 showed stable right-sided renal cysts and lesions have not significantly changed since prior examination. -Return to clinic in 1 year with repeat CT scan and further evaluation.  2. Anemia:  -Likely secondary to  CKD. -Chronic and unchanged. -Labs from 01/09/2021 show hemoglobin of 10.1.  Creatinine 1.83. -Would likely benefit from Retacrit in the future.   3. Chronic renal insufficiency:  -She is followed by nephrology. -Current creatinine from 01/10/2020 is 1.83 with a BUN of 40.  Disposition: -RTC in 1 year with repeat CT scan and MD assessment. -Already has follow-up with nephrology and urology.  Patient expressed understanding and was in agreement with this plan. She also understands that She can call clinic at any  time with any questions, concerns, or complaints.   Cancer Staging Renal cancer, left [facility]) Staging form: Kidney, AJCC 8th Edition - Clinical stage from 01/26/2017: Stage III (cT3a, cNX, cM0) - Signed by Lloyd Huger, MD on 01/26/2017 Histologic grade (G): G2 Histologic grading system: 4 grade system Laterality: Left Histologic sub-type: Clear cell   Jacquelin Hawking, NP   01/10/2021 11:18 AM

## 2021-02-13 ENCOUNTER — Telehealth: Payer: Self-pay | Admitting: Nurse Practitioner

## 2021-02-13 NOTE — Telephone Encounter (Signed)
Pt has made appt for AWV on 02/28/21 at 11:15 am.  Pt would like phone call only. Not equipped for virtual.

## 2021-02-13 NOTE — Telephone Encounter (Signed)
Copied from [place] [phone]. Topic: Medicare AWV >> Feb 13, 2021  1:28 PM Cher Nakai R wrote: Reason for CRM:   Left message for patient to call back and schedule the Medicare Annual Wellness Visit (AWV) virtually or by telephone.  Last AWV 05/24/2019  Please schedule at anytime with [facility]-Nurse Health Advisor.  45 minute appointment  Any questions, please call me at [phone]

## 2021-02-28 ENCOUNTER — Ambulatory Visit (INDEPENDENT_AMBULATORY_CARE_PROVIDER_SITE_OTHER): Payer: BC Managed Care – PPO

## 2021-02-28 VITALS — Ht 67.0 in | Wt 210.0 lb

## 2021-02-28 DIAGNOSIS — Z0000 Encounter for general adult medical examination without abnormal findings: Secondary | ICD-10-CM | POA: Diagnosis not present

## 2021-02-28 NOTE — Progress Notes (Signed)
I connected with Sarah [name] today by telephone and verified that I am speaking with the correct person using two identifiers. Location patient: home Location provider: work Persons participating in the virtual visit: Becka Lidiya, Reise LPN.   I discussed the limitations, risks, security and privacy concerns of performing an evaluation and management service by telephone and the availability of in person appointments. I also discussed with the patient that there may be a patient responsible charge related to this service. The patient expressed understanding and verbally consented to this telephonic visit.    Interactive audio and video telecommunications were attempted between this provider and patient, however failed, due to patient having technical difficulties OR patient did not have access to video capability.  We continued and completed visit with audio only.     Vital signs may be patient reported or missing.  Subjective:   Sarah [name] is a 70 y.o. female who presents for Medicare Annual (Subsequent) preventive examination.  Review of Systems     Cardiac Risk Factors include: advanced age (>24men, >33 women);hypertension;obesity (BMI >30kg/m2);sedentary lifestyle;smoking/ tobacco exposure     Objective:    Today's Vitals   02/28/21 1116 02/28/21 1117  Weight: 210 lb (95.3 kg)   Height: 5\' 7"  (1.702 m)   PainSc:  4    Body mass index is 32.89 kg/m.  Advanced Directives 02/28/2021 05/25/2019 05/24/2019 11/10/2018 05/19/2018 10/25/2017 06/22/2017  Does Patient Have a Medical Advance Directive? No No No No No No No  Would patient like information on creating a medical advance directive? - No - Patient declined - No - Patient declined Yes (MAU/Ambulatory/Procedural Areas - Information given) - Yes (ED - Information included in AVS)    Current Medications (verified) Outpatient Encounter Medications as of 02/28/2021  Medication Sig  . acetaminophen (TYLENOL) 325 MG  tablet Take 650 mg by mouth every 6 (six) hours as needed for mild pain, moderate pain or headache.   Marland Kitchen amLODipine (NORVASC) 5 MG tablet Take 1 tablet (5 mg total) by mouth daily.  Marland Kitchen atenolol (TENORMIN) 100 MG tablet Take 1 tablet (100 mg total) by mouth daily.  Marland Kitchen atorvastatin (LIPITOR) 40 MG tablet TAKE 1 TABLET BY MOUTH EVERY DAY  . benazepril-hydrochlorthiazide (LOTENSIN HCT) 20-25 MG tablet Take 1 tablet by mouth daily.  . Cholecalciferol (VITAMIN D-3 PO) Take 600 Units by mouth daily.  . citalopram (CELEXA) 20 MG tablet Take 1 tablet (20 mg total) by mouth daily.  . Loperamide HCl (IMODIUM PO) Take by mouth. PRN  . Multiple Vitamin (MULTIVITAMIN) tablet Take 1 tablet by mouth daily.  . traMADol (ULTRAM) 50 MG tablet TAKE 1 TABLET (50 MG TOTAL) BY MOUTH EVERY 12 (TWELVE) HOURS AS NEEDED.   No facility-administered encounter medications on file as of 02/28/2021.    Allergies (verified) Codeine   History: Past Medical History:  Diagnosis Date  . Abnormal mammogram, unspecified   . Aneurysm ([place]) 2004   cerebral x  4, [facility]  . Anxiety   . Arthritis   . Benign neoplasm of breast 2012  . Breast complaint 2010  . Chronic kidney disease    Renal Cell Cancer  . Hypertension   . Obesity, unspecified   . Personal history of tobacco use, presenting hazards to health   . Renal cancer, left ([place]) 11/2016   Renal Cell with Left nephrectomy.  Marland Kitchen Special screening for malignant neoplasms, colon    Past Surgical History:  Procedure Laterality Date  . ABDOMINAL HYSTERECTOMY  1993  . BREAST BIOPSY Right 2012   stereo bx showing a 90mm radial scar in an estimated 8 volume of tissue  . BREAST BIOPSY Right Apr 09, 2005   Fibrocystic changes, ductal adenosis, microcalcifications, focal stromal/epithelial proliferation, possible fibroadenoma type proliferation  . CEREBRAL ANEURYSM REPAIR  2004  . COLONOSCOPY  2008   Dr. Nicolasa Ducking  . EYE SURGERY    . FOOT SURGERY Right 2003  .  LAPAROSCOPIC NEPHRECTOMY, HAND ASSISTED Left 01/01/2017   Procedure: HAND ASSISTED LAPAROSCOPIC NEPHRECTOMY;  Surgeon: Nickie Retort, MD;  Location: [facility] ORS;  Service: Urology;  Laterality: Left;  . TONSILLECTOMY  1997   Family History  Problem Relation Age of Onset  . Cancer Cousin        maternal first cousin with breast cancer  . Cancer Other        colon and ovarian cancers, no relationship listed  . Cancer Father        prostate  . Hypertension Father   . Hypertension Sister   . Hypertension Brother   . Hypertension Paternal Grandmother   . Heart disease Paternal Grandmother   . Hypertension Paternal Grandfather   . Heart disease Paternal Grandfather   . Breast cancer Neg Hx    Social History   Socioeconomic History  . Marital status: Divorced    Spouse name: Not on file  . Number of children: Not on file  . Years of education: Not on file  . Highest education level: GED or equivalent  Occupational History    Comment: full time   Tobacco Use  . Smoking status: Current Every Day Smoker    Packs/day: 0.25    Years: 40.00    Pack years: 10.00    Types: Cigarettes  . Smokeless tobacco: Never Used  Vaping Use  . Vaping Use: Never used  Substance and Sexual Activity  . Alcohol use: Yes    Comment: socially  . Drug use: No  . Sexual activity: Yes  Other Topics Concern  . Not on file  Social History Narrative  . Not on file   Social Determinants of Health   Financial Resource Strain: Low Risk   . Difficulty of Paying Living Expenses: Not hard at all  Food Insecurity: No Food Insecurity  . Worried About Charity fundraiser in the Last Year: Never true  . Ran Out of Food in the Last Year: Never true  Transportation Needs: No Transportation Needs  . Lack of Transportation (Medical): No  . Lack of Transportation (Non-Medical): No  Physical Activity: Inactive  . Days of Exercise per Week: 0 days  . Minutes of Exercise per Session: 0 min  Stress: No Stress  Concern Present  . Feeling of Stress : Not at all  Social Connections: Not on file    Tobacco Counseling Ready to quit: No Counseling given: Not Answered   Clinical Intake:  Pre-visit preparation completed: Yes  Pain : 0-10 Pain Score: 4  Pain Type: Acute pain Pain Orientation: Left Pain Descriptors / Indicators: Sharp Pain Onset: Yesterday Pain Frequency: Intermittent     Nutritional Status: BMI > 30  Obese Nutritional Risks: None Diabetes: No  How often do you need to have someone help you when you read instructions, pamphlets, or other written materials from your doctor or pharmacy?: 1 - Never What is the last grade level you completed in school?: GED  Diabetic? no  Interpreter Needed?: No  Information entered by :: NAllen LPN  Activities of Daily Living In your present state of health, do you have any difficulty performing the following activities: 02/28/2021 07/08/2020  Hearing? N N  Vision? N N  Difficulty concentrating or making decisions? N N  Walking or climbing stairs? N Y  Dressing or bathing? N N  Doing errands, shopping? N N  Preparing Food and eating ? N -  Using the Toilet? N -  In the past six months, have you accidently leaked urine? Y -  Do you have problems with loss of bowel control? Y -  Managing your Medications? N -  Managing your Finances? N -  Housekeeping or managing your Housekeeping? N -  Some recent data might be hidden    Patient Care Team: Venita Lick, NP as PCP - General (Nurse Practitioner) Nickie Retort, MD (Inactive) as Consulting Physician (Urology) Lloyd Huger, MD as Consulting Physician (Oncology)  Indicate any recent Medical Services you may have received from other than [facility] providers in the past year (date may be approximate).     Assessment:   This is a routine wellness examination for Sarah [name].  Hearing/Vision screen  Hearing Screening   125Hz  250Hz  500Hz  1000Hz  2000Hz  3000Hz  4000Hz  6000Hz   8000Hz   Right ear:           Left ear:           Vision Screening Comments: Regular eye exams, Dr. Gloriann Loan  Dietary issues and exercise activities discussed: Current Exercise Habits: The patient has a physically strenuous job, but has no regular exercise apart from work.  Goals    . Patient Stated     02/28/2021, wants to retire soon    . Quit Smoking     Smoking cessation disscussed    . Quit smoking / using tobacco      Depression Screen PHQ 2/9 Scores 02/28/2021 01/08/2021 07/08/2020 12/29/2019 06/28/2019 05/24/2019 12/28/2018  PHQ - 2 Score 0 0 0 0 0 0 0  PHQ- 9 Score - 0 1 0 2 - 0    Fall Risk Fall Risk  02/28/2021 07/08/2020 06/28/2019 05/24/2019 05/19/2018  Falls in the past year? 0 0 0 0 Yes  Number falls in past yr: - 0 0 - 1  Injury with Fall? - 0 0 - No  Risk for fall due to : Medication side effect - - - -  Follow up Falls evaluation completed;Education provided;Falls prevention discussed - Falls evaluation completed - -    FALL RISK PREVENTION PERTAINING TO THE HOME:  Any stairs in or around the home? Yes  If so, are there any without handrails? No  Home free of loose throw rugs in walkways, pet beds, electrical cords, etc? Yes  Adequate lighting in your home to reduce risk of falls? Yes   ASSISTIVE DEVICES UTILIZED TO PREVENT FALLS:  Life alert? No  Use of a cane, walker or w/c? No  Grab bars in the bathroom? No  Shower chair or bench in shower? No  Elevated toilet seat or a handicapped toilet? Yes   TIMED UP AND GO:  Was the test performed? No   Cognitive Function:     6CIT Screen 02/28/2021 05/24/2019 05/19/2018  What Year? 0 points 0 points 0 points  What month? 0 points 0 points 0 points  What time? 0 points 0 points 0 points  Count back from 20 0 points 0 points 0 points  Months in reverse 0 points 0 points 0 points  Repeat phrase 6 points 0  points 0 points  Total Score 6 0 0    Immunizations Immunization History  Administered Date(s) Administered  . Fluad  Quad(high Dose 65+) 09/14/2019  . Influenza, High Dose Seasonal PF 08/30/2017, 08/15/2018, 09/24/2020  . Influenza-Unspecified 09/04/2015, 09/18/2016, 08/30/2017  . Moderna Sars-Covid-2 Vaccination 02/01/2020, 02/29/2020, 10/12/2020  . Pneumococcal Conjugate-13 06/22/2017  . Pneumococcal Polysaccharide-23 12/28/2018  . Td 03/31/2006  . Tdap 06/09/2016  . Zoster 01/24/2013  . Zoster Recombinat (Shingrix) 08/29/2019, 12/29/2019    TDAP status: Up to date  Flu Vaccine status: Up to date  Pneumococcal vaccine status: Up to date  Covid-19 vaccine status: Completed vaccines  Qualifies for Shingles Vaccine? Yes   Zostavax completed Yes   Shingrix Completed?: Yes  Screening Tests Health Maintenance  Topic Date Due  . COVID-19 Vaccine (4 - Booster for Moderna series) 04/11/2021  . INFLUENZA VACCINE  06/30/2021  . MAMMOGRAM  08/29/2021  . COLONOSCOPY (Pts 45-55yrs Insurance coverage will need to be confirmed)  12/24/2025  . TETANUS/TDAP  06/09/2026  . DEXA SCAN  Completed  . Hepatitis C Screening  Completed  . PNA vac Low Risk Adult  Completed  . HPV VACCINES  Aged Out    Health Maintenance  There are no preventive care reminders to display for this patient.  Colorectal cancer screening: Type of screening: Colonoscopy. Completed 12/24/2020. Repeat every 5 years  Mammogram status: Completed 08/30/2019. Repeat every 2 years  Bone Density status: Completed 08/30/2019. Results reflect: Bone density results: NORMAL. Repeat every 0 years.  Lung Cancer Screening: (Low Dose CT Chest recommended if Age 52-80 years, 30 pack-year currently smoking OR have quit w/in 15years.) does not qualify.   Lung Cancer Screening Referral: no  Additional Screening:  Hepatitis C Screening: does qualify; Completed 06/09/2016  Vision Screening: Recommended annual ophthalmology exams for early detection of glaucoma and other disorders of the eye. Is the patient up to date with their annual eye exam?   Yes  Who is the provider or what is the name of the office in which the patient attends annual eye exams? Dr. Gloriann Loan If pt is not established with a provider, would they like to be referred to a provider to establish care? No .   Dental Screening: Recommended annual dental exams for proper oral hygiene  Community Resource Referral / Chronic Care Management: CRR required this visit?  No   CCM required this visit?  No      Plan:     I have personally reviewed and noted the following in the patient's chart:   . Medical and social history . Use of alcohol, tobacco or illicit drugs  . Current medications and supplements . Functional ability and status . Nutritional status . Physical activity . Advanced directives . List of other physicians . Hospitalizations, surgeries, and ER visits in previous 12 months . Vitals . Screenings to include cognitive, depression, and falls . Referrals and appointments  In addition, I have reviewed and discussed with patient certain preventive protocols, quality metrics, and best practice recommendations. A written personalized care plan for preventive services as well as general preventive health recommendations were provided to patient.     Kellie Simmering, LPN   04/01/2022   Nurse Notes:

## 2021-02-28 NOTE — Patient Instructions (Signed)
Sarah [name] , Thank you for taking time to come for your Medicare Wellness Visit. I appreciate your ongoing commitment to your health goals. Please review the following plan we discussed and let me know if I can assist you in the future.   Screening recommendations/referrals: Colonoscopy: completed 12/24/2020, due01/25/2027 Mammogram: completed 08/30/2019 Bone Density: completed 08/30/2019 Recommended yearly ophthalmology/optometry visit for glaucoma screening and checkup Recommended yearly dental visit for hygiene and checkup  Vaccinations: Influenza vaccine: completed 09/24/2020, due 06/30/2021 Pneumococcal vaccine: completed 12/28/2018 Tdap vaccine: completed 06/09/2016, due 06/09/2026 Shingles vaccine: completed   Covid-19: 10/12/2020, 02/29/2020, 02/01/2020  Advanced directives: Advance directive discussed with you today.   Conditions/risks identified: smoking  Next appointment: Follow up in one year for your annual wellness visit    Preventive Care 25 Years and Older, Female Preventive care refers to lifestyle choices and visits with your health care provider that can promote health and wellness. What does preventive care include?  A yearly physical exam. This is also called an annual well check.  Dental exams once or twice a year.  Routine eye exams. Ask your health care provider how often you should have your eyes checked.  Personal lifestyle choices, including:  Daily care of your teeth and gums.  Regular physical activity.  Eating a healthy diet.  Avoiding tobacco and drug use.  Limiting alcohol use.  Practicing safe sex.  Taking low-dose aspirin every day.  Taking vitamin and mineral supplements as recommended by your health care provider. What happens during an annual well check? The services and screenings done by your health care provider during your annual well check will depend on your age, overall health, lifestyle risk factors, and family history of  disease. Counseling  Your health care provider may ask you questions about your:  Alcohol use.  Tobacco use.  Drug use.  Emotional well-being.  Home and relationship well-being.  Sexual activity.  Eating habits.  History of falls.  Memory and ability to understand (cognition).  Work and work Statistician.  Reproductive health. Screening  You may have the following tests or measurements:  Height, weight, and BMI.  Blood pressure.  Lipid and cholesterol levels. These may be checked every 5 years, or more frequently if you are over 25 years old.  Skin check.  Lung cancer screening. You may have this screening every year starting at age 7 if you have a 30-pack-year history of smoking and currently smoke or have quit within the past 15 years.  Fecal occult blood test (FOBT) of the stool. You may have this test every year starting at age 54.  Flexible sigmoidoscopy or colonoscopy. You may have a sigmoidoscopy every 5 years or a colonoscopy every 10 years starting at age 28.  Hepatitis C blood test.  Hepatitis B blood test.  Sexually transmitted disease (STD) testing.  Diabetes screening. This is done by checking your blood sugar (glucose) after you have not eaten for a while (fasting). You may have this done every 1-3 years.  Bone density scan. This is done to screen for osteoporosis. You may have this done starting at age 50.  Mammogram. This may be done every 1-2 years. Talk to your health care provider about how often you should have regular mammograms. Talk with your health care provider about your test results, treatment options, and if necessary, the need for more tests. Vaccines  Your health care provider may recommend certain vaccines, such as:  Influenza vaccine. This is recommended every year.  Tetanus, diphtheria, and  acellular pertussis (Tdap, Td) vaccine. You may need a Td booster every 10 years.  Zoster vaccine. You may need this after age  71.  Pneumococcal 13-valent conjugate (PCV13) vaccine. One dose is recommended after age 90.  Pneumococcal polysaccharide (PPSV23) vaccine. One dose is recommended after age 20. Talk to your health care provider about which screenings and vaccines you need and how often you need them. This information is not intended to replace advice given to you by your health care provider. Make sure you discuss any questions you have with your health care provider. Document Released: 12/13/2015 Document Revised: 08/05/2016 Document Reviewed: 09/17/2015 Elsevier Interactive Patient Education  2017 [employer] Prevention in the Home Falls can cause injuries. They can happen to people of all ages. There are many things you can do to make your home safe and to help prevent falls. What can I do on the outside of my home?  Regularly fix the edges of walkways and driveways and fix any cracks.  Remove anything that might make you trip as you walk through a door, such as a raised step or threshold.  Trim any bushes or trees on the path to your home.  Use bright outdoor lighting.  Clear any walking paths of anything that might make someone trip, such as rocks or tools.  Regularly check to see if handrails are loose or broken. Make sure that both sides of any steps have handrails.  Any raised decks and porches should have guardrails on the edges.  Have any leaves, snow, or ice cleared regularly.  Use sand or salt on walking paths during winter.  Clean up any spills in your garage right away. This includes oil or grease spills. What can I do in the bathroom?  Use night lights.  Install grab bars by the toilet and in the tub and shower. Do not use towel bars as grab bars.  Use non-skid mats or decals in the tub or shower.  If you need to sit down in the shower, use a plastic, non-slip stool.  Keep the floor dry. Clean up any water that spills on the floor as soon as it happens.  Remove  soap buildup in the tub or shower regularly.  Attach bath mats securely with double-sided non-slip rug tape.  Do not have throw rugs and other things on the floor that can make you trip. What can I do in the bedroom?  Use night lights.  Make sure that you have a light by your bed that is easy to reach.  Do not use any sheets or blankets that are too big for your bed. They should not hang down onto the floor.  Have a firm chair that has side arms. You can use this for support while you get dressed.  Do not have throw rugs and other things on the floor that can make you trip. What can I do in the kitchen?  Clean up any spills right away.  Avoid walking on wet floors.  Keep items that you use a lot in easy-to-reach places.  If you need to reach something above you, use a strong step stool that has a grab bar.  Keep electrical cords out of the way.  Do not use floor polish or wax that makes floors slippery. If you must use wax, use non-skid floor wax.  Do not have throw rugs and other things on the floor that can make you trip. What can I do with my stairs?  Do not leave any items on the stairs.  Make sure that there are handrails on both sides of the stairs and use them. Fix handrails that are broken or loose. Make sure that handrails are as long as the stairways.  Check any carpeting to make sure that it is firmly attached to the stairs. Fix any carpet that is loose or worn.  Avoid having throw rugs at the top or bottom of the stairs. If you do have throw rugs, attach them to the floor with carpet tape.  Make sure that you have a light switch at the top of the stairs and the bottom of the stairs. If you do not have them, ask someone to add them for you. What else can I do to help prevent falls?  Wear shoes that:  Do not have high heels.  Have rubber bottoms.  Are comfortable and fit you well.  Are closed at the toe. Do not wear sandals.  If you use a  stepladder:  Make sure that it is fully opened. Do not climb a closed stepladder.  Make sure that both sides of the stepladder are locked into place.  Ask someone to hold it for you, if possible.  Clearly mark and make sure that you can see:  Any grab bars or handrails.  First and last steps.  Where the edge of each step is.  Use tools that help you move around (mobility aids) if they are needed. These include:  Canes.  Walkers.  Scooters.  Crutches.  Turn on the lights when you go into a dark area. Replace any light bulbs as soon as they burn out.  Set up your furniture so you have a clear path. Avoid moving your furniture around.  If any of your floors are uneven, fix them.  If there are any pets around you, be aware of where they are.  Review your medicines with your doctor. Some medicines can make you feel dizzy. This can increase your chance of falling. Ask your doctor what other things that you can do to help prevent falls. This information is not intended to replace advice given to you by your health care provider. Make sure you discuss any questions you have with your health care provider. Document Released: 09/12/2009 Document Revised: 04/23/2016 Document Reviewed: 12/21/2014 Elsevier Interactive Patient Education  2017 [employer].

## 2021-04-10 ENCOUNTER — Other Ambulatory Visit: Payer: Self-pay | Admitting: Nurse Practitioner

## 2021-04-10 NOTE — Telephone Encounter (Signed)
Requested medication (s) are due for refill today: yes  Requested medication (s) are on the active medication list: yes  Last refill: 10/31/20  #14  0 refills  Future visit scheduled yes 07/09/21  Notes to clinic:  Not delegated  Requested Prescriptions  Pending Prescriptions Disp Refills   traMADol (ULTRAM) 50 MG tablet [Pharmacy Med Name: TRAMADOL HCL 50 MG TABLET] 14 tablet 0    Sig: TAKE 1 TABLET BY MOUTH EVERY 12 HOURS AS NEEDED.      Not Delegated - Analgesics:  Opioid Agonists Failed - 04/10/2021  4:50 PM      Failed - This refill cannot be delegated      Failed - Urine Drug Screen completed in last 360 days      Passed - Valid encounter within last 6 months    Recent Outpatient Visits           3 months ago Renal cancer, left ([place])   [place] Cannady, Jolene T, NP   9 months ago Renal cancer, left ([place])   [place], Jolene T, NP   1 year ago Renal cancer, left ([place])   [place], Jolene T, NP   1 year ago Class 2 severe obesity due to excess calories with serious comorbidity and body mass index (BMI) of 35.0 to 35.9 in adult [facility])   [place], Jolene T, NP   2 years ago Hypertensive heart/kidney disease without HF and with CKD stage III ([place])   [place], Jolene T, NP       Future Appointments             In 2 months Hollice Espy, MD [place]   In 3 months Cannady, Barbaraann Faster, NP [employer]   In 23 months  [employer], [place]

## 2021-04-11 NOTE — Telephone Encounter (Signed)
Scheduled 8/10

## 2021-05-12 ENCOUNTER — Telehealth: Payer: Self-pay | Admitting: Nurse Practitioner

## 2021-05-12 ENCOUNTER — Other Ambulatory Visit: Payer: Self-pay | Admitting: Nurse Practitioner

## 2021-05-12 DIAGNOSIS — Z111 Encounter for screening for respiratory tuberculosis: Secondary | ICD-10-CM

## 2021-05-12 NOTE — Telephone Encounter (Signed)
Pt is calling to reqeust a TB test when 2 weeks of employment for a new job. She started Friday 05/09/21. CB- [phone]

## 2021-05-12 NOTE — Telephone Encounter (Signed)
Lvm asking if her job accepts the blood test as our office does not do the skin test.

## 2021-05-12 NOTE — Telephone Encounter (Signed)
Pt verbalized understanding.

## 2021-05-12 NOTE — Telephone Encounter (Signed)
Pt stated job was ok for blood test, does pt need apt for this or can lab order be placed?

## 2021-05-13 ENCOUNTER — Other Ambulatory Visit: Payer: Self-pay

## 2021-05-13 ENCOUNTER — Other Ambulatory Visit: Payer: Medicare HMO

## 2021-05-13 DIAGNOSIS — Z111 Encounter for screening for respiratory tuberculosis: Secondary | ICD-10-CM | POA: Diagnosis not present

## 2021-05-17 LAB — QUANTIFERON-TB GOLD PLUS
QuantiFERON Mitogen Value: >10 IU/mL
QuantiFERON Nil Value: 0.04 IU/mL
QuantiFERON TB1 Ag Value: 0.01 IU/mL
QuantiFERON TB2 Ag Value: 0.01 IU/mL
QuantiFERON-TB Gold Plus: NEGATIVE

## 2021-05-17 NOTE — Progress Notes (Signed)
Contacted via MyChart   Good morning Sarah [name], your TB testing has returned negative.  Any questions? Keep being awesome!!  Thank you for allowing me to participate in your care.  I appreciate you. Kindest regards, Keianna Signer

## 2021-06-05 DIAGNOSIS — I10 Essential (primary) hypertension: Secondary | ICD-10-CM | POA: Diagnosis not present

## 2021-06-05 DIAGNOSIS — N1832 Chronic kidney disease, stage 3b: Secondary | ICD-10-CM | POA: Diagnosis not present

## 2021-06-05 DIAGNOSIS — Z905 Acquired absence of kidney: Secondary | ICD-10-CM | POA: Diagnosis not present

## 2021-06-05 DIAGNOSIS — R82998 Other abnormal findings in urine: Secondary | ICD-10-CM | POA: Diagnosis not present

## 2021-06-11 ENCOUNTER — Other Ambulatory Visit: Payer: Self-pay

## 2021-06-11 ENCOUNTER — Ambulatory Visit
Admission: RE | Admit: 2021-06-11 | Discharge: 2021-06-11 | Disposition: A | Discharge status: Home / Self Care | Payer: Medicare HMO | Source: Ambulatory Visit | Attending: Urology | Admitting: Urology

## 2021-06-11 DIAGNOSIS — N1832 Chronic kidney disease, stage 3b: Secondary | ICD-10-CM | POA: Diagnosis not present

## 2021-06-11 DIAGNOSIS — I10 Essential (primary) hypertension: Secondary | ICD-10-CM | POA: Diagnosis not present

## 2021-06-11 DIAGNOSIS — N179 Acute kidney failure, unspecified: Secondary | ICD-10-CM | POA: Diagnosis not present

## 2021-06-11 DIAGNOSIS — N281 Cyst of kidney, acquired: Secondary | ICD-10-CM | POA: Diagnosis not present

## 2021-06-11 DIAGNOSIS — Z905 Acquired absence of kidney: Secondary | ICD-10-CM | POA: Diagnosis not present

## 2021-06-12 ENCOUNTER — Ambulatory Visit: Payer: Medicare HMO | Admitting: Urology

## 2021-06-12 ENCOUNTER — Encounter: Payer: Self-pay | Admitting: Urology

## 2021-06-12 VITALS — BP 118/74 | HR 80 | Ht 66.0 in | Wt 197.0 lb

## 2021-06-12 DIAGNOSIS — N1832 Chronic kidney disease, stage 3b: Secondary | ICD-10-CM | POA: Diagnosis not present

## 2021-06-12 DIAGNOSIS — R350 Frequency of micturition: Secondary | ICD-10-CM

## 2021-06-12 DIAGNOSIS — N281 Cyst of kidney, acquired: Secondary | ICD-10-CM

## 2021-06-12 DIAGNOSIS — Z85528 Personal history of other malignant neoplasm of kidney: Secondary | ICD-10-CM

## 2021-06-12 DIAGNOSIS — F17200 Nicotine dependence, unspecified, uncomplicated: Secondary | ICD-10-CM

## 2021-06-12 DIAGNOSIS — R69 Illness, unspecified: Secondary | ICD-10-CM | POA: Diagnosis not present

## 2021-06-12 MED ORDER — OXYBUTYNIN CHLORIDE ER 10 MG PO TB24: 10.0000 mg | ORAL_TABLET | Freq: Every day | ORAL | 11 refills | Status: DC

## 2021-06-12 NOTE — Progress Notes (Signed)
06/12/2021 9:11 AM   [name] 1951/05/10 [id number]  Referring provider: Venita Lick, NP [address] [place],  [place] [address]  No chief complaint on file.   HPI: 70 year old female with multiple medical issues including history of multifocal RCC status post left nephrectomy, multifocal complex right renal cyst, and OAB who returns to the office for routine annual follow-up.  She underwent left laparoscopic hand-assisted nephrectomy on 01/01/2017 by Dr. Baruch Gouty.   Surgical pathology c/w multifocal clear cell renal cell carcinoma. 3 distinct tumors were seen. 2 were cystic and the third was solid. There was tumor extension into the renal ssinus. Margins were negative. Lymphovascular invasion was not identified. Histologic grade 2. Greatest dimension was 2.1 cm. Stage pT3a.  We have been following her multifocal right renal cysts with serial imaging.  Renal ultrasound this year is essentially unchanged from last year although the final radiologic interpretation is pending.  She saw medical oncology back in February and has had serial CT scans as well.  Her most recent CT of the abdomen with contrast in 12/2020 was stable from the previous year all of which is very reassuring.  Personal history of stage IIIb CKD, slightly worsened renal function up to 1.83 on 12/2020, previously around 1.5.  She continues to have urinary urgency and frequency.  Last year, she was given samples of Vesicare but cannot member getting the samples or whether or not they helped.  She does drink plenty of water and tries to avoid irritating beverages.  She is open to trying another medication for this.  Unfortunately, she continues to smoke.  She underwent a chest CT last year as part of the lung cancer screening which showed was felt to be probably a benign lesion and is due for follow-up chest CT.  I do not see that this is ordered and that she has follow-up for this.   PMH: Past Medical History:   Diagnosis Date   Abnormal mammogram, unspecified    Aneurysm ([place]) 2004   cerebral x  4, [facility]   Anxiety    Arthritis    Benign neoplasm of breast 2012   Breast complaint 2010   Chronic kidney disease    Renal Cell Cancer   Hypertension    Obesity, unspecified    Personal history of tobacco use, presenting hazards to health    Renal cancer, left ([place]) 11/2016   Renal Cell with Left nephrectomy.   Special screening for malignant neoplasms, colon     Surgical History: Past Surgical History:  Procedure Laterality Date   ABDOMINAL HYSTERECTOMY  1993   BREAST BIOPSY Right 2012   stereo bx showing a 76mm radial scar in an estimated 8 volume of tissue   BREAST BIOPSY Right Apr 09, 2005   Fibrocystic changes, ductal adenosis, microcalcifications, focal stromal/epithelial proliferation, possible fibroadenoma type proliferation   CEREBRAL ANEURYSM REPAIR  2004   COLONOSCOPY  2008   Dr. Nicolasa Ducking   EYE SURGERY     FOOT SURGERY Right 2003   LAPAROSCOPIC NEPHRECTOMY, HAND ASSISTED Left 01/01/2017   Procedure: HAND ASSISTED LAPAROSCOPIC NEPHRECTOMY;  Surgeon: Nickie Retort, MD;  Location: [facility] ORS;  Service: Urology;  Laterality: Left;   TONSILLECTOMY  1997    Home Medications:  Allergies as of 06/12/2021       Reactions   Codeine Itching        Medication List        Accurate as of June 12, 2021  9:11 AM. If you have any questions, ask your nurse or doctor.          acetaminophen 325 MG tablet Commonly known as: TYLENOL Take 650 mg by mouth every 6 (six) hours as needed for mild pain, moderate pain or headache.   amLODipine 5 MG tablet Commonly known as: NORVASC Take 1 tablet (5 mg total) by mouth daily.   atenolol 100 MG tablet Commonly known as: TENORMIN Take 1 tablet (100 mg total) by mouth daily.   atorvastatin 40 MG tablet Commonly known as: LIPITOR TAKE 1 TABLET BY MOUTH EVERY DAY   benazepril-hydrochlorthiazide 20-25 MG tablet Commonly known  as: LOTENSIN HCT Take 1 tablet by mouth daily.   citalopram 20 MG tablet Commonly known as: CELEXA Take 1 tablet (20 mg total) by mouth daily.   IMODIUM PO Take by mouth. PRN   multivitamin tablet Take 1 tablet by mouth daily.   traMADol 50 MG tablet Commonly known as: ULTRAM TAKE 1 TABLET BY MOUTH EVERY 12 HOURS AS NEEDED.   VITAMIN D-3 PO Take 600 Units by mouth daily.        Allergies:  Allergies  Allergen Reactions   Codeine Itching    Family History: Family History  Problem Relation Age of Onset   Cancer Cousin        maternal first cousin with breast cancer   Cancer Other        colon and ovarian cancers, no relationship listed   Cancer Father        prostate   Hypertension Father    Hypertension Sister    Hypertension Brother    Hypertension Paternal Grandmother    Heart disease Paternal Grandmother    Hypertension Paternal Grandfather    Heart disease Paternal Grandfather    Breast cancer Neg Hx     Social History:  reports that she has been smoking cigarettes. She has a 10.00 pack-year smoking history. She has never used smokeless tobacco. She reports current alcohol use. She reports that she does not use drugs.   Physical Exam: There were no vitals taken for this visit.  Constitutional:  Alert and oriented, No acute distress. HEENT: [place] AT, moist mucus membranes.  Trachea midline, no masses. Cardiovascular: No clubbing, cyanosis, or edema. Respiratory: Normal respiratory effort, no increased work of breathing. GI: Abdomen is soft, nontender, nondistended, no abdominal masses GU: No CVA tenderness Lymph: No cervical or inguinal lymphadenopathy. Skin: No rashes, bruises or suspicious lesions. Neurologic: Grossly intact, no focal deficits, moving all 4 extremities. Psychiatric: Normal mood and affect.  Laboratory Data: Lab Results  Component Value Date   WBC 6.6 01/09/2021   HGB 10.1 (L) 01/09/2021   HCT 30.1 (L) 01/09/2021   MCV 89.6  01/09/2021   PLT 266 01/09/2021    Lab Results  Component Value Date   CREATININE 1.83 (H) 01/09/2021   Pertinent Imaging:   US RENAL Renal ultrasound performed yesterday was personally reviewed.  It appears similar to renal ultrasound 2 years ago with multifocal Bosniak 1 and 2 renal cysts.  Final report pending.  I also reviewed the CT scan from 12/2020, overall stability of right renal cyst which is reassuring.  Assessment & Plan:    1. Acquired renal cyst of right kidney Multifocal right renal cysts, primarily Bosniak Bosniak I and II appear stable which is reassuring  We will continue to follow with annual serial renal ultrasounds alternating CTs with medical oncology.  High risk for development of contralateral disease. -  US RENAL; Future  2. History of renal cell cancer As above  Chest imaging as below - US RENAL; Future  3. Stage 3b chronic kidney disease ([place]) Slightly worsened  4. Smoker Due for lung cancer screening CT, will refer back to the lung cancer screening program as she seems to have been lost to follow-up - Ambulatory Referral for Lung Cancer Scre  5. Urinary frequency Continue bothersome urgency frequency with occasional rare urge incontinence  We will try oxybutynin 10 mg XL, discussed possible side effects include dry eyes, dry mouth and constipation.  She will list if he has any of the side effects and whether or not the medication is effective.  We can adjust as needed.   F/u 1 year with [facility]  Hollice Espy, MD  [facility] [address] [place], [place] [address] [phone]

## 2021-06-13 ENCOUNTER — Telehealth: Payer: Self-pay

## 2021-06-13 NOTE — Telephone Encounter (Signed)
Patient left a message on triage line stating that oxybutinin sent into pharmacy yesterday was $115. She would like a less expensive medication sent in. Contacted the pharmacy to clarify, medication is generic, pharmacy states it is unclear why medication is so costly patient's insurance should be contacted for preferred medication list, none is provided by pharmacy search. Left patient a message to call back

## 2021-06-13 NOTE — Telephone Encounter (Signed)
Patient called the office back after speaking to her pharmacy.  They advised her that SOLIFENACIN is in her tier and would be a $0 copay for the medication.  Please advise if we can send this medication to the patient's pharmacy.

## 2021-06-16 MED ORDER — SOLIFENACIN SUCCINATE 5 MG PO TABS: 5.0000 mg | ORAL_TABLET | Freq: Every day | ORAL | 11 refills | Status: DC

## 2021-06-16 NOTE — Telephone Encounter (Signed)
Per Dr. Erlene Quan ok to send in new script. Script sent into pharmacy. Patient notified

## 2021-06-19 DIAGNOSIS — N1832 Chronic kidney disease, stage 3b: Secondary | ICD-10-CM | POA: Diagnosis not present

## 2021-07-06 ENCOUNTER — Encounter: Payer: Self-pay | Admitting: Nurse Practitioner

## 2021-07-06 DIAGNOSIS — E213 Hyperparathyroidism, unspecified: Secondary | ICD-10-CM | POA: Insufficient documentation

## 2021-07-09 ENCOUNTER — Other Ambulatory Visit: Payer: Self-pay

## 2021-07-09 ENCOUNTER — Ambulatory Visit (INDEPENDENT_AMBULATORY_CARE_PROVIDER_SITE_OTHER): Payer: Medicare HMO | Admitting: Nurse Practitioner

## 2021-07-09 ENCOUNTER — Encounter: Payer: Self-pay | Admitting: Nurse Practitioner

## 2021-07-09 VITALS — BP 115/68 | HR 69 | Temp 98.3°F | Wt 198.2 lb

## 2021-07-09 DIAGNOSIS — E6609 Other obesity due to excess calories: Secondary | ICD-10-CM | POA: Diagnosis not present

## 2021-07-09 DIAGNOSIS — E7800 Pure hypercholesterolemia, unspecified: Secondary | ICD-10-CM

## 2021-07-09 DIAGNOSIS — J432 Centrilobular emphysema: Secondary | ICD-10-CM

## 2021-07-09 DIAGNOSIS — I700 Atherosclerosis of aorta: Secondary | ICD-10-CM

## 2021-07-09 DIAGNOSIS — I1310 Hypertensive heart and chronic kidney disease without heart failure, with stage 1 through stage 4 chronic kidney disease, or unspecified chronic kidney disease: Secondary | ICD-10-CM | POA: Diagnosis not present

## 2021-07-09 DIAGNOSIS — C642 Malignant neoplasm of left kidney, except renal pelvis: Secondary | ICD-10-CM | POA: Diagnosis not present

## 2021-07-09 DIAGNOSIS — F17210 Nicotine dependence, cigarettes, uncomplicated: Secondary | ICD-10-CM

## 2021-07-09 DIAGNOSIS — Z6831 Body mass index (BMI) 31.0-31.9, adult: Secondary | ICD-10-CM | POA: Diagnosis not present

## 2021-07-09 DIAGNOSIS — N1832 Chronic kidney disease, stage 3b: Secondary | ICD-10-CM

## 2021-07-09 DIAGNOSIS — R69 Illness, unspecified: Secondary | ICD-10-CM | POA: Diagnosis not present

## 2021-07-09 DIAGNOSIS — F324 Major depressive disorder, single episode, in partial remission: Secondary | ICD-10-CM

## 2021-07-09 NOTE — Progress Notes (Signed)
BP 115/68   Pulse 69   Temp 98.3 F (36.8 C) (Oral)   Wt 198 lb 3.2 oz (89.9 kg)   SpO2 98%   BMI 31.99 kg/m    Subjective:    Patient ID: Sarah [name], female    DOB: 10-23-1951, 70 y.o.   MRN: [id number]  HPI: Sarah [name] is a 70 y.o. female  Chief Complaint  Patient presents with   Chronic Kidney Disease   Mood   COPD   Hyperlipidemia   Hypertension   RENAL CANCER STAGE III: Followed by Dr. Grayland Ormond and urology.  Had left nephrectomy on January 01, 2017.  Requires abdominal/pelvic CT scan approximately every 6 months for 3 years after her nephrectomy, last was on 01/09/21.  CHRONIC KIDNEY DISEASE Recent labs 06/05/21 with CRT 2.36 and GFR 22, BUN 42, PTH 100. Is followed by Dr. Candiss Norse and last saw on 06/11/21. She reports nephrology told her to take 1/2 Benazepril due to decline in function -- is scheduled to have labs rechecked upcoming. CKD status: stable Medications renally dose: yes Previous renal evaluation: yes Pneumovax:  Up to Date Influenza Vaccine:  Up To Date  HYPERTENSION / HYPERLIPIDEMIA Continues on Amlodipine, Atenolol, Benazepril-HCTZ and Atorvastatin.   Satisfied with current treatment? yes Duration of hypertension: chronic BP monitoring frequency: rarely BP range: 120-130/80 BP medication side effects: no Duration of hyperlipidemia: chronic Cholesterol medication side effects: no Cholesterol supplements: none Medication compliance: good compliance Aspirin: no Recent stressors: no Recurrent headaches: no Visual changes: no Palpitations: no Dyspnea: no Chest pain: no Lower extremity edema: no Dizzy/lightheaded: no    COPD Last lung CA screening on 06/05/20 noting centrilobular emphysema and aortic atherosclerosis.  No current inhalers.  Is a current every day smoker, about 1/2 PPD.  Has smoked since her teenage years.   COPD status: stable Satisfied with current treatment?: yes Oxygen use: no Dyspnea frequency: Cough  frequency: Rescue inhaler frequency:   Limitation of activity: no Productive cough: Last Spirometry: Pneumovax: Up to Date Influenza: Up to Date   DEPRESSION Continues on Celexa 20 MG. Mood status: controlled Satisfied with current treatment?: yes Symptom severity: mild  Duration of current treatment : chronic Side effects: no Medication compliance: excellent compliance Psychotherapy/counseling: none Anxious mood: no Anhedonia: no Significant weight loss or gain: no Insomnia: none Fatigue: no Feelings of worthlessness or guilt: no Impaired concentration/indecisiveness: no Suicidal ideations: no Hopelessness: no Crying spells: no Depression screen [facility] 2/9 07/09/2021 02/28/2021 01/08/2021 07/08/2020 12/29/2019  Decreased Interest 0 0 0 0 0  Down, Depressed, Hopeless 0 0 0 0 0  PHQ - 2 Score 0 0 0 0 0  Altered sleeping 0 - 0 0 0  Tired, decreased energy 1 - 0 0 0  Change in appetite 0 - 0 1 0  Feeling bad or failure about yourself  0 - 0 0 0  Trouble concentrating 0 - 0 0 0  Moving slowly or fidgety/restless 0 - 0 0 0  Suicidal thoughts 0 - 0 0 0  PHQ-9 Score 1 - 0 1 0  Difficult doing work/chores Not difficult at all - Not difficult at all Not difficult at all Not difficult at all  Some recent data might be hidden     Relevant past medical, surgical, family and social history reviewed and updated as indicated. Interim medical history since our last visit reviewed. Allergies and medications reviewed and updated.  Review of Systems  Constitutional:  Negative for activity change, appetite change, diaphoresis,  fatigue and fever.  Respiratory:  Negative for cough, chest tightness and shortness of breath.   Cardiovascular:  Negative for chest pain, palpitations and leg swelling.  Gastrointestinal: Negative.   Neurological:  Negative for dizziness, syncope, weakness, light-headedness, numbness and headaches.  Psychiatric/Behavioral: Negative.     Per HPI unless specifically  indicated above     Objective:    BP 115/68   Pulse 69   Temp 98.3 F (36.8 C) (Oral)   Wt 198 lb 3.2 oz (89.9 kg)   SpO2 98%   BMI 31.99 kg/m   Wt Readings from Last 3 Encounters:  07/09/21 198 lb 3.2 oz (89.9 kg)  06/12/21 197 lb (89.4 kg)  02/28/21 210 lb (95.3 kg)    Physical Exam Vitals and nursing note reviewed.  Constitutional:      General: She is awake. She is not in acute distress.    Appearance: She is well-developed. She is obese. She is not ill-appearing.  HENT:     Head: Normocephalic.     Right Ear: Hearing normal.     Left Ear: Hearing normal.     Nose: Nose normal.     Mouth/Throat:     Mouth: Mucous membranes are moist.  Eyes:     General: Lids are normal.        Right eye: No discharge.        Left eye: No discharge.     Conjunctiva/sclera: Conjunctivae normal.     Pupils: Pupils are equal, round, and reactive to light.  Neck:     Vascular: No carotid bruit.  Cardiovascular:     Rate and Rhythm: Normal rate and regular rhythm.     Heart sounds: Normal heart sounds. No murmur heard.   No gallop.  Pulmonary:     Effort: Pulmonary effort is normal.     Breath sounds: Normal breath sounds.  Abdominal:     General: Bowel sounds are normal.     Palpations: Abdomen is soft.  Musculoskeletal:     Cervical back: Normal range of motion and neck supple.     Right lower leg: No edema.     Left lower leg: No edema.  Skin:    General: Skin is warm and dry.  Neurological:     Mental Status: She is alert and oriented to person, place, and time.  Psychiatric:        Attention and Perception: Attention normal.        Mood and Affect: Mood normal.        Behavior: Behavior normal. Behavior is cooperative.        Thought Content: Thought content normal.        Judgment: Judgment normal.    Results for orders placed or performed in visit on 05/13/21  QuantiFERON-TB Gold Plus  Result Value Ref Range   QuantiFERON Incubation Incubation performed.     QuantiFERON Criteria Comment    QuantiFERON TB1 Ag Value 0.01 IU/mL   QuantiFERON TB2 Ag Value 0.01 IU/mL   QuantiFERON Nil Value 0.04 IU/mL   QuantiFERON Mitogen Value >10.00 IU/mL   QuantiFERON-TB Gold Plus Negative Negative      Assessment & Plan:   Problem List Items Addressed This Visit       Cardiovascular and Mediastinum   Hypertensive heart/kidney disease without HF and with CKD stage III ([place])    Chronic, ongoing and followed by nephrology. BP at goal today, recommend she continue to check at home and monitor diet, DASH  diet. Continue current medication regimen and adjust as needed + collaboration with nephrology and CA team.  Recent labs with nephrology obtained and reviewed today.  Return in 6 months.         Relevant Orders   TSH   Aortic atherosclerosis ([place])    Noted on lung CT screening.  Continue statin daily and recommend modest weight loss + focus on healthy diet.  Complete cessation of smoking recommended.         Respiratory   Pulmonary emphysema ([facility])    Chronic, stable without inhalers at this time.  Recommend complete cessation of smoking.  Plan on spirometry at next visit.  Continue yearly lung screening.         Genitourinary   Renal cancer, left ([place]) - Primary    Followed by Dr. Grayland Ormond.  Continue this collaboration and review of notes.       Chronic kidney disease, stage 3 ([facility])    Chronic, ongoing.  Recent labs obtained with nephrology.  Continue current medication regimen and collaboration with nephrology.   Recent labs and notes  reviewed today.           Other   Nicotine dependence, cigarettes, uncomplicated    I have recommended complete cessation of tobacco use. I have discussed various options available for assistance with tobacco cessation including over the counter methods (Nicotine gum, patch and lozenges). We also discussed prescription options (Chantix, Nicotine Inhaler / Nasal Spray). The patient is not interested in pursuing  any prescription tobacco cessation options at this time.        Hyperlipidemia    Chronic, ongoing.  Continue current medication regimen and adjust as needed.  Lipid panel today.         Relevant Orders   Lipid Panel w/o Chol/HDL Ratio   Depression    Chronic, ongoing.  Continue current medication regimen and adjust as needed.  Refills sent in as needed.  Denies SI/HI.         Obesity    BMI 31.99. Recommended eating smaller high protein, low fat meals more frequently and exercising 30 mins a day 5 times a week with a goal of 10-15lb weight loss in the next 3 months. Patient voiced their understanding and motivation to adhere to these recommendations.          Follow up plan: Return in about 6 months (around 01/09/2022) for HTN/HLD, RENAL CA, MOOD, CKD, COPD -- need spirometry.

## 2021-07-09 NOTE — Assessment & Plan Note (Signed)
Noted on lung CT screening.  Continue statin daily and recommend modest weight loss + focus on healthy diet.  Complete cessation of smoking recommended. °

## 2021-07-09 NOTE — Assessment & Plan Note (Signed)
Chronic, ongoing.  Continue current medication regimen and adjust as needed. Lipid panel today. 

## 2021-07-09 NOTE — Assessment & Plan Note (Signed)
Chronic, ongoing.  Continue current medication regimen and adjust as needed.  Refills sent in as needed.  Denies SI/HI.   

## 2021-07-09 NOTE — Assessment & Plan Note (Signed)
Followed by Dr. Grayland Ormond.  Continue this collaboration and review of notes.

## 2021-07-09 NOTE — Patient Instructions (Signed)
https://www.nhlbi.nih.gov/files/docs/public/heart/dash_brief.pdf">  DASH Eating Plan DASH stands for Dietary Approaches to Stop Hypertension. The DASH eating plan is a healthy eating plan that has been shown to: Reduce high blood pressure (hypertension). Reduce your risk for type 2 diabetes, heart disease, and stroke. Help with weight loss. What are tips for following this plan? Reading food labels Check food labels for the amount of salt (sodium) per serving. Choose foods with less than 5 percent of the Daily Value of sodium. Generally, foods with less than 300 milligrams (mg) of sodium per serving fit into this eating plan. To find whole grains, look for the word "whole" as the first word in the ingredient list. Shopping Buy products labeled as "low-sodium" or "no salt added." Buy fresh foods. Avoid canned foods and pre-made or frozen meals. Cooking Avoid adding salt when cooking. Use salt-free seasonings or herbs instead of table salt or sea salt. Check with your health care provider or pharmacist before using salt substitutes. Do not fry foods. Cook foods using healthy methods such as baking, boiling, grilling, roasting, and broiling instead. Cook with heart-healthy oils, such as olive, canola, avocado, soybean, or sunflower oil. Meal planning  Eat a balanced diet that includes: 4 or more servings of fruits and 4 or more servings of vegetables each day. Try to fill one-half of your plate with fruits and vegetables. 6-8 servings of whole grains each day. Less than 6 oz (170 g) of lean meat, poultry, or fish each day. A 3-oz (85-g) serving of meat is about the same size as a deck of cards. One egg equals 1 oz (28 g). 2-3 servings of low-fat dairy each day. One serving is 1 cup (237 mL). 1 serving of nuts, seeds, or beans 5 times each week. 2-3 servings of heart-healthy fats. Healthy fats called omega-3 fatty acids are found in foods such as walnuts, flaxseeds, fortified milks, and eggs.  These fats are also found in cold-water fish, such as sardines, salmon, and mackerel. Limit how much you eat of: Canned or prepackaged foods. Food that is high in trans fat, such as some fried foods. Food that is high in saturated fat, such as fatty meat. Desserts and other sweets, sugary drinks, and other foods with added sugar. Full-fat dairy products. Do not salt foods before eating. Do not eat more than 4 egg yolks a week. Try to eat at least 2 vegetarian meals a week. Eat more home-cooked food and less restaurant, buffet, and fast food.  Lifestyle When eating at a restaurant, ask that your food be prepared with less salt or no salt, if possible. If you drink alcohol: Limit how much you use to: 0-1 drink a day for women who are not pregnant. 0-2 drinks a day for men. Be aware of how much alcohol is in your drink. In the [place] one drink equals one 12 oz bottle of beer (355 mL), one 5 oz glass of wine (148 mL), or one 1 oz glass of hard liquor (44 mL). General information Avoid eating more than 2,300 mg of salt a day. If you have hypertension, you may need to reduce your sodium intake to 1,500 mg a day. Work with your health care provider to maintain a healthy body weight or to lose weight. Ask what an ideal weight is for you. Get at least 30 minutes of exercise that causes your heart to beat faster (aerobic exercise) most days of the week. Activities may include walking, swimming, or biking. Work with your health care provider   or dietitian to adjust your eating plan to your individual calorie needs. What foods should I eat? Fruits All fresh, dried, or frozen fruit. Canned fruit in natural juice (without addedsugar). Vegetables Fresh or frozen vegetables (raw, steamed, roasted, or grilled). Low-sodium or reduced-sodium tomato and vegetable juice. Low-sodium or reduced-sodium tomatosauce and tomato paste. Low-sodium or reduced-sodium canned vegetables. Grains Whole-grain or  whole-wheat bread. Whole-grain or whole-wheat pasta. Brown rice. Oatmeal. Quinoa. Bulgur. Whole-grain and low-sodium cereals. Pita bread.Low-fat, low-sodium crackers. Whole-wheat flour tortillas. Meats and other proteins Skinless chicken or turkey. Ground chicken or turkey. Pork with fat trimmed off. Fish and seafood. Egg whites. Dried beans, peas, or lentils. Unsalted nuts, nut butters, and seeds. Unsalted canned beans. Lean cuts of beef with fat trimmed off. Low-sodium, lean precooked or cured meat, such as sausages or meatloaves. Dairy Low-fat (1%) or fat-free (skim) milk. Reduced-fat, low-fat, or fat-free cheeses. Nonfat, low-sodium ricotta or cottage cheese. Low-fat or nonfatyogurt. Low-fat, low-sodium cheese. Fats and oils Soft margarine without trans fats. Vegetable oil. Reduced-fat, low-fat, or light mayonnaise and salad dressings (reduced-sodium). Canola, safflower, olive, avocado, soybean, andsunflower oils. Avocado. Seasonings and condiments Herbs. Spices. Seasoning mixes without salt. Other foods Unsalted popcorn and pretzels. Fat-free sweets. The items listed above may not be a complete list of foods and beverages you can eat. Contact a dietitian for more information. What foods should I avoid? Fruits Canned fruit in a light or heavy syrup. Fried fruit. Fruit in cream or buttersauce. Vegetables Creamed or fried vegetables. Vegetables in a cheese sauce. Regular canned vegetables (not low-sodium or reduced-sodium). Regular canned tomato sauce and paste (not low-sodium or reduced-sodium). Regular tomato and vegetable juice(not low-sodium or reduced-sodium). Pickles. Olives. Grains Baked goods made with fat, such as croissants, muffins, or some breads. Drypasta or rice meal packs. Meats and other proteins Fatty cuts of meat. Ribs. Fried meat. Bacon. Bologna, salami, and other precooked or cured meats, such as sausages or meat loaves. Fat from the back of a pig (fatback). Bratwurst.  Salted nuts and seeds. Canned beans with added salt. Canned orsmoked fish. Whole eggs or egg yolks. Chicken or turkey with skin. Dairy Whole or 2% milk, cream, and half-and-half. Whole or full-fat cream cheese. Whole-fat or sweetened yogurt. Full-fat cheese. Nondairy creamers. Whippedtoppings. Processed cheese and cheese spreads. Fats and oils Butter. Stick margarine. Lard. Shortening. Ghee. Bacon fat. Tropical oils, suchas coconut, palm kernel, or palm oil. Seasonings and condiments Onion salt, garlic salt, seasoned salt, table salt, and sea salt. Worcestershire sauce. Tartar sauce. Barbecue sauce. Teriyaki sauce. Soy sauce, including reduced-sodium. Steak sauce. Canned and packaged gravies. Fish sauce. Oyster sauce. Cocktail sauce. Store-bought horseradish. Ketchup. Mustard. Meat flavorings and tenderizers. Bouillon cubes. Hot sauces. Pre-made or packaged marinades. Pre-made or packaged taco seasonings. Relishes. Regular saladdressings. Other foods Salted popcorn and pretzels. The items listed above may not be a complete list of foods and beverages you should avoid. Contact a dietitian for more information. Where to find more information National Heart, Lung, and Blood Institute: www.nhlbi.nih.gov American Heart Association: www.heart.org Academy of Nutrition and Dietetics: www.eatright.org National Kidney Foundation: www.kidney.org Summary The DASH eating plan is a healthy eating plan that has been shown to reduce high blood pressure (hypertension). It may also reduce your risk for type 2 diabetes, heart disease, and stroke. When on the DASH eating plan, aim to eat more fresh fruits and vegetables, whole grains, lean proteins, low-fat dairy, and heart-healthy fats. With the DASH eating plan, you should limit salt (sodium) intake to 2,300   mg a day. If you have hypertension, you may need to reduce your sodium intake to 1,500 mg a day. Work with your health care provider or dietitian to adjust  your eating plan to your individual calorie needs. This information is not intended to replace advice given to you by your health care provider. Make sure you discuss any questions you have with your healthcare provider. Document Revised: 10/20/2019 Document Reviewed: 10/20/2019 Elsevier Patient Education  2022 Elsevier Inc.  

## 2021-07-09 NOTE — Assessment & Plan Note (Signed)
Chronic, ongoing.  Recent labs obtained with nephrology.  Continue current medication regimen and collaboration with nephrology.   Recent labs and notes  reviewed today.

## 2021-07-09 NOTE — Assessment & Plan Note (Signed)
Chronic, ongoing and followed by nephrology. BP at goal today, recommend she continue to check at home and monitor diet, DASH diet. Continue current medication regimen and adjust as needed + collaboration with nephrology and CA team.  Recent labs with nephrology obtained and reviewed today.  Return in 6 months.   °

## 2021-07-09 NOTE — Assessment & Plan Note (Signed)
BMI 31.99.  Recommended eating smaller high protein, low fat meals more frequently and exercising 30 mins a day 5 times a week with a goal of 10-15lb weight loss in the next 3 months. Patient voiced their understanding and motivation to adhere to these recommendations.  

## 2021-07-09 NOTE — Assessment & Plan Note (Signed)
Chronic, stable without inhalers at this time.  Recommend complete cessation of smoking.  Plan on spirometry at next visit.  Continue yearly lung screening.

## 2021-07-09 NOTE — Assessment & Plan Note (Signed)
I have recommended complete cessation of tobacco use. I have discussed various options available for assistance with tobacco cessation including over the counter methods (Nicotine gum, patch and lozenges). We also discussed prescription options (Chantix, Nicotine Inhaler / Nasal Spray). The patient is not interested in pursuing any prescription tobacco cessation options at this time.  

## 2021-07-10 LAB — LIPID PANEL W/O CHOL/HDL RATIO
Cholesterol, Total: 99 mg/dL — ABNORMAL LOW (ref 100–199)
HDL: 41 mg/dL (ref >39)
LDL Chol Calc (NIH): 46 mg/dL (ref 0–99)
Triglycerides: 45 mg/dL (ref 0–149)
VLDL Cholesterol Cal: 12 mg/dL (ref 5–40)

## 2021-07-10 LAB — TSH: TSH: 1.92 u[IU]/mL (ref 0.450–4.500)

## 2021-07-10 NOTE — Progress Notes (Signed)
Contacted via MyChart   Good morning Sarah [name], labs have returned and continue to look fabulous.  No medication changes needed.  Have a wonderful day!! Keep being awesome!!  Thank you for allowing me to participate in your care.  I appreciate you. Kindest regards, Jazmyne Beauchesne

## 2021-08-11 ENCOUNTER — Other Ambulatory Visit: Payer: Self-pay | Admitting: *Deleted

## 2021-08-11 DIAGNOSIS — Z87891 Personal history of nicotine dependence: Secondary | ICD-10-CM

## 2021-08-11 DIAGNOSIS — F17210 Nicotine dependence, cigarettes, uncomplicated: Secondary | ICD-10-CM

## 2021-08-22 ENCOUNTER — Other Ambulatory Visit: Payer: Self-pay | Admitting: Nurse Practitioner

## 2021-08-22 NOTE — Telephone Encounter (Signed)
Requested medications are due for refill today yes  Requested medications are on the active medication list yes  Last refill 04/11/21  Last visit 07/09/21  Future visit scheduled 01/08/22  Notes to clinic Not Delegated.

## 2021-08-27 ENCOUNTER — Ambulatory Visit
Admission: RE | Admit: 2021-08-27 | Discharge: 2021-08-27 | Disposition: A | Discharge status: Home / Self Care | Payer: Medicare HMO | Source: Ambulatory Visit | Attending: Acute Care | Admitting: Acute Care

## 2021-08-27 ENCOUNTER — Other Ambulatory Visit: Payer: Self-pay

## 2021-08-27 DIAGNOSIS — F17210 Nicotine dependence, cigarettes, uncomplicated: Secondary | ICD-10-CM | POA: Diagnosis not present

## 2021-08-27 DIAGNOSIS — Z87891 Personal history of nicotine dependence: Secondary | ICD-10-CM | POA: Diagnosis not present

## 2021-08-27 DIAGNOSIS — R69 Illness, unspecified: Secondary | ICD-10-CM | POA: Diagnosis not present

## 2021-09-15 DIAGNOSIS — Z905 Acquired absence of kidney: Secondary | ICD-10-CM | POA: Diagnosis not present

## 2021-09-15 DIAGNOSIS — I10 Essential (primary) hypertension: Secondary | ICD-10-CM | POA: Diagnosis not present

## 2021-09-15 DIAGNOSIS — N184 Chronic kidney disease, stage 4 (severe): Secondary | ICD-10-CM | POA: Diagnosis not present

## 2021-09-15 DIAGNOSIS — N281 Cyst of kidney, acquired: Secondary | ICD-10-CM | POA: Diagnosis not present

## 2021-09-18 ENCOUNTER — Other Ambulatory Visit: Payer: Self-pay | Admitting: Acute Care

## 2021-09-18 DIAGNOSIS — F17210 Nicotine dependence, cigarettes, uncomplicated: Secondary | ICD-10-CM

## 2021-09-18 DIAGNOSIS — Z87891 Personal history of nicotine dependence: Secondary | ICD-10-CM

## 2021-09-18 NOTE — Progress Notes (Signed)
Please call patient and let them  know their  low dose Ct was read as a Lung RADS 2: nodules that are benign in appearance and behavior with a very low likelihood of becoming a clinically active cancer due to size or lack of growth. Recommendation per radiology is for a repeat LDCT in 12 months. .Please let them  know we will order and schedule their  annual screening scan for 07/2022. Please let them  know there was notation of CAD on their  scan.  Please remind the patient  that this is a non-gated exam therefore degree or severity of disease  cannot be determined. Please have them  follow up with their PCP regarding potential risk factor modification, dietary therapy or pharmacologic therapy if clinically indicated. Pt.  is  currently on statin therapy. Please place order for annual  screening scan for  07/2022 and fax results to PCP. Thanks so much. Please let patient know there was notation  of possible pulmonary Artery Hypertension on the scan. Pulmonary arterial hypertension is a type of high blood pressure that affects the arteries in the lungs and the right side of the heart. This can cause the blood vessels that carry blood from your heart to your lungs can become hard and narrow. Please follow up with your PCP regarding any additional follow up , cardiology consult to determine need for formal work up. Thanks [employer].

## 2021-10-21 DIAGNOSIS — N184 Chronic kidney disease, stage 4 (severe): Secondary | ICD-10-CM | POA: Diagnosis not present

## 2021-12-26 ENCOUNTER — Other Ambulatory Visit: Payer: Self-pay | Admitting: Nurse Practitioner

## 2021-12-26 MED ORDER — TRAMADOL HCL 50 MG PO TABS: 50.0000 mg | ORAL_TABLET | Freq: Two times a day (BID) | ORAL | 0 refills | Status: DC | PRN

## 2021-12-26 NOTE — Telephone Encounter (Signed)
Medication Refill - Medication: traMADol (ULTRAM) 50 MG tablet Pt stated has pain in knee caps. Pt stated that she has had this problem for a long time; it is not a new symptom.   Has the patient contacted their pharmacy? Yes.   Pharmacy advised the patient to contact PCP  (Agent: If yes, when and what did the pharmacy advise?)  Preferred Pharmacy (with phone number or street name):  [id number] - [place], [place] [address]  [address] [place] [address]  Phone: [phone] Fax: [phone]  Hours: Not open 24 hours    Has the patient been seen for an appointment in the last year OR does the patient have an upcoming appointment? Yes.    Agent: Please be advised that RX refills may take up to 3 business days. We ask that you follow-up with your pharmacy.

## 2021-12-26 NOTE — Telephone Encounter (Signed)
Requested medication (s) are due for refill today:   Provider to review  Requested medication (s) are on the active medication list:   Yes  Future visit scheduled:   Yes with Jolene in 1 wk.   Last ordered: 08/25/2021 #14, 0 refills  Returned because its a non delegated refill   Requested Prescriptions  Pending Prescriptions Disp Refills   traMADol (ULTRAM) 50 MG tablet 14 tablet 0    Sig: Take 1 tablet (50 mg total) by mouth every 12 (twelve) hours as needed.     Not Delegated - Analgesics:  Opioid Agonists Failed - 12/26/2021 10:51 AM      Failed - This refill cannot be delegated      Failed - Urine Drug Screen completed in last 360 days      Passed - Valid encounter within last 6 months    Recent Outpatient Visits           5 months ago Renal cancer, left ([place])   [place] Cannady, Jolene T, NP   11 months ago Renal cancer, left ([place])   [place], Jolene T, NP   1 year ago Renal cancer, left ([place])   [place], Jolene T, NP   1 year ago Renal cancer, left ([place])   [place], Jolene T, NP   2 years ago Class 2 severe obesity due to excess calories with serious comorbidity and body mass index (BMI) of 35.0 to 35.9 in adult [facility])   [place], Barbaraann Faster, NP       Future Appointments             In 1 week Cannady, Barbaraann Faster, NP [facility], [facility]   In 2 months  [facility], [place]   In 5 months Hollice Espy, MD [place]

## 2021-12-26 NOTE — Telephone Encounter (Signed)
Requested medication (s) are due for refill today -Expired Rx  Requested medication (s) are on the active medication list -yes  Future visit scheduled -yes  Last refill: 12/04/20 #90 4RF  Notes to clinic: Request RF: expired Rx  Requested Prescriptions  Pending Prescriptions Disp Refills   atorvastatin (LIPITOR) 40 MG tablet [Pharmacy Med Name: ATORVASTATIN 40 MG TABLET] 90 tablet 2    Sig: TAKE 1 TABLET BY MOUTH EVERY DAY     Cardiovascular:  Antilipid - Statins Failed - 12/26/2021  9:44 AM      Failed - Total Cholesterol in normal range and within 360 days    Cholesterol, Total  Date Value Ref Range Status  07/09/2021 99 (L) 100 - 199 mg/dL Final   Cholesterol Piccolo, Waived  Date Value Ref Range Status  01/07/2018 123 <200 mg/dL Final    Comment:                            Desirable                <200                         Borderline High      200- 239                         High                     >239           Passed - LDL in normal range and within 360 days    LDL Chol Calc (NIH)  Date Value Ref Range Status  07/09/2021 46 0 - 99 mg/dL Final          Passed - HDL in normal range and within 360 days    HDL  Date Value Ref Range Status  07/09/2021 41 >39 mg/dL Final          Passed - Triglycerides in normal range and within 360 days    Triglycerides  Date Value Ref Range Status  07/09/2021 45 0 - 149 mg/dL Final   Triglycerides Piccolo,Waived  Date Value Ref Range Status  01/07/2018 118 <150 mg/dL Final    Comment:                            Normal                   <150                         Borderline High     150 - 199                         High                200 - 499                         Very High                >499           Passed - Patient is not pregnant      Passed - Valid encounter within last 12 months  Recent Outpatient Visits           5 months ago Renal cancer, left ([place])   [place] Cannady,  Jolene T, NP   11 months ago Renal cancer, left ([place])   [place], Jolene T, NP   1 year ago Renal cancer, left ([place])   [place], Jolene T, NP   1 year ago Renal cancer, left ([place])   [place], Jolene T, NP   2 years ago Class 2 severe obesity due to excess calories with serious comorbidity and body mass index (BMI) of 35.0 to 35.9 in adult [facility])   [place], Henrine Screws T, NP       Future Appointments             In 1 week Cannady, Barbaraann Faster, NP [facility]   In 2 months  [facility]   In 5 months Hollice Espy, MD [place]               Requested Prescriptions  Pending Prescriptions Disp Refills   atorvastatin (LIPITOR) 40 MG tablet [Pharmacy Med Name: ATORVASTATIN 40 MG TABLET] 90 tablet 2    Sig: TAKE 1 TABLET BY MOUTH EVERY DAY     Cardiovascular:  Antilipid - Statins Failed - 12/26/2021  9:44 AM      Failed - Total Cholesterol in normal range and within 360 days    Cholesterol, Total  Date Value Ref Range Status  07/09/2021 99 (L) 100 - 199 mg/dL Final   Cholesterol Piccolo, Waived  Date Value Ref Range Status  01/07/2018 123 <200 mg/dL Final    Comment:                            Desirable                <200                         Borderline High      200- 239                         High                     >239           Passed - LDL in normal range and within 360 days    LDL Chol Calc (NIH)  Date Value Ref Range Status  07/09/2021 46 0 - 99 mg/dL Final          Passed - HDL in normal range and within 360 days    HDL  Date Value Ref Range Status  07/09/2021 41 >39 mg/dL Final          Passed - Triglycerides in normal range and within 360 days    Triglycerides  Date Value Ref Range Status  07/09/2021 45 0 - 149 mg/dL Final   Triglycerides Piccolo,Waived  Date Value Ref Range Status   01/07/2018 118 <150 mg/dL Final    Comment:                            Normal                   <150  Borderline High     150 - 199                         High                200 - 499                         Very High                >499           Passed - Patient is not pregnant      Passed - Valid encounter within last 12 months    Recent Outpatient Visits           5 months ago Renal cancer, left ([place])   [place] Cannady, Jolene T, NP   11 months ago Renal cancer, left ([place])   [place], Jolene T, NP   1 year ago Renal cancer, left ([place])   [place], Jolene T, NP   1 year ago Renal cancer, left ([place])   [employer], Jolene T, NP   2 years ago Class 2 severe obesity due to excess calories with serious comorbidity and body mass index (BMI) of 35.0 to 35.9 in adult [facility])   [place], Barbaraann Faster, NP       Future Appointments             In 1 week Cannady, Barbaraann Faster, NP [facility]   In 2 months  [facility], [place]   In 5 months Hollice Espy, MD [place]

## 2022-01-08 ENCOUNTER — Ambulatory Visit: Payer: Medicare HMO | Admitting: Oncology

## 2022-01-08 ENCOUNTER — Encounter: Payer: Self-pay | Admitting: Nurse Practitioner

## 2022-01-08 ENCOUNTER — Other Ambulatory Visit: Payer: Self-pay | Admitting: Emergency Medicine

## 2022-01-08 ENCOUNTER — Other Ambulatory Visit: Payer: Self-pay

## 2022-01-08 ENCOUNTER — Ambulatory Visit (INDEPENDENT_AMBULATORY_CARE_PROVIDER_SITE_OTHER): Payer: Medicare HMO | Admitting: Nurse Practitioner

## 2022-01-08 VITALS — BP 124/71 | HR 61 | Temp 98.3°F | Ht 66.0 in | Wt 194.0 lb

## 2022-01-08 DIAGNOSIS — D631 Anemia in chronic kidney disease: Secondary | ICD-10-CM

## 2022-01-08 DIAGNOSIS — J432 Centrilobular emphysema: Secondary | ICD-10-CM | POA: Diagnosis not present

## 2022-01-08 DIAGNOSIS — E559 Vitamin D deficiency, unspecified: Secondary | ICD-10-CM

## 2022-01-08 DIAGNOSIS — I700 Atherosclerosis of aorta: Secondary | ICD-10-CM

## 2022-01-08 DIAGNOSIS — R9431 Abnormal electrocardiogram [ECG] [EKG]: Secondary | ICD-10-CM | POA: Insufficient documentation

## 2022-01-08 DIAGNOSIS — I1310 Hypertensive heart and chronic kidney disease without heart failure, with stage 1 through stage 4 chronic kidney disease, or unspecified chronic kidney disease: Secondary | ICD-10-CM

## 2022-01-08 DIAGNOSIS — E7800 Pure hypercholesterolemia, unspecified: Secondary | ICD-10-CM

## 2022-01-08 DIAGNOSIS — Z6831 Body mass index (BMI) 31.0-31.9, adult: Secondary | ICD-10-CM

## 2022-01-08 DIAGNOSIS — E6609 Other obesity due to excess calories: Secondary | ICD-10-CM | POA: Diagnosis not present

## 2022-01-08 DIAGNOSIS — C642 Malignant neoplasm of left kidney, except renal pelvis: Secondary | ICD-10-CM

## 2022-01-08 DIAGNOSIS — N1832 Chronic kidney disease, stage 3b: Secondary | ICD-10-CM

## 2022-01-08 DIAGNOSIS — F17210 Nicotine dependence, cigarettes, uncomplicated: Secondary | ICD-10-CM

## 2022-01-08 DIAGNOSIS — F324 Major depressive disorder, single episode, in partial remission: Secondary | ICD-10-CM

## 2022-01-08 DIAGNOSIS — R69 Illness, unspecified: Secondary | ICD-10-CM | POA: Diagnosis not present

## 2022-01-08 MED ORDER — CITALOPRAM HYDROBROMIDE 20 MG PO TABS: 20.0000 mg | ORAL_TABLET | Freq: Every day | ORAL | 4 refills | Status: DC

## 2022-01-08 MED ORDER — BENAZEPRIL-HYDROCHLOROTHIAZIDE 20-25 MG PO TABS
1.0000 | ORAL_TABLET | Freq: Every day | ORAL | 4 refills | Status: DC
Start: 2022-01-08 — End: 2022-02-23

## 2022-01-08 MED ORDER — ATORVASTATIN CALCIUM 40 MG PO TABS: 40.0000 mg | ORAL_TABLET | Freq: Every day | ORAL | 4 refills | Status: DC

## 2022-01-08 MED ORDER — AMLODIPINE BESYLATE 5 MG PO TABS: 5.0000 mg | ORAL_TABLET | Freq: Every day | ORAL | 4 refills | Status: DC

## 2022-01-08 MED ORDER — ATENOLOL 100 MG PO TABS: 100.0000 mg | ORAL_TABLET | Freq: Every day | ORAL | 4 refills | Status: DC

## 2022-01-08 NOTE — Assessment & Plan Note (Signed)
BMI 31.31. Recommended eating smaller high protein, low fat meals more frequently and exercising 30 mins a day 5 times a week with a goal of 10-15lb weight loss in the next 3 months. Patient voiced their understanding and motivation to adhere to these recommendations.

## 2022-01-08 NOTE — Assessment & Plan Note (Signed)
Chronic, ongoing and followed by nephrology. BP at goal today, recommend she continue to check at home and monitor diet, DASH diet. Continue current medication regimen and adjust as needed + collaboration with nephrology and CA team.  Recent labs with nephrology obtained and reviewed today.  Return in 6 months.

## 2022-01-08 NOTE — Assessment & Plan Note (Signed)
Followed by Dr. Grayland Ormond.  Continue this collaboration and review of notes.  Contacted via secure chat and requested lipid and Vit D be added to labs tomorrow.

## 2022-01-08 NOTE — Assessment & Plan Note (Signed)
Noted in office today -- CT recently with possible pulmonary hypertension -- referral to cardiology and echo ordered.

## 2022-01-08 NOTE — Patient Instructions (Signed)
COPD and Physical Activity Chronic obstructive pulmonary disease (COPD) is a long-term, or chronic, condition that affects the lungs. COPD is a general term that can be used to describe many problems that cause inflammation of the lungs and limit airflow. These conditions include chronic bronchitis and emphysema. The main symptom of COPD is shortness of breath, which makes it harder to do even simple tasks. This can also make it harder to exercise and stay active. Talk with your health care provider about treatments to help you breathe better and actions you can take to prevent breathing problems during physical activity. What are the benefits of exercising when you have COPD? Exercising regularly is an important part of a healthy lifestyle. You can still exercise and do physical activities even though you have COPD. Exercise and physical activity improve your shortness of breath by increasing blood flow (circulation). This causes your heart to pump more oxygen through your body. Moderate exercise can: Improve oxygen use. Increase your energy level. Help with shortness of breath. Strengthen your breathing muscles. Improve heart health. Help with sleep. Improve your self-esteem and feelings of self-worth. Lower depression, stress, and anxiety. Exercise can benefit everyone with COPD. The severity of your disease may affect how hard you can exercise, especially at first, but everyone can benefit. Talk with your health care provider about how much exercise is safe for you, and which activities and exercises are safe for you. What actions can I take to prevent breathing problems during physical activity? Sign up for a pulmonary rehabilitation program. This type of program may include: Education about lung diseases. Exercise classes that teach you how to exercise and be more active while improving your breathing. This usually involves: Exercise using your lower extremities, such as a stationary  bicycle. About 30 minutes of exercise, 2 to 5 times per week, for 6 to 12 weeks. Strength training, such as push-ups or leg lifts. Nutrition education. Group classes in which you can talk with others who also have COPD and learn ways to manage stress. If you use an oxygen tank, you should use it while you exercise. Work with your health care provider to adjust your oxygen for your physical activity. Your resting flow rate is different from your flow rate during physical activity. How to manage your breathing while exercising While you are exercising: Take slow breaths. Pace yourself, and do nottry to go too fast. Purse your lips while breathing out. Pursing your lips is similar to a kissing or whistling position. If doing exercise that uses a quick burst of effort, such as weight lifting: Breathe in before starting the exercise. Breathe out during the hardest part of the exercise, such as raising the weights. Where to find support You can find support for exercising with COPD from: Your health care provider. A pulmonary rehabilitation program. Your local health department or community health programs. Support groups, either online or in-person. Your health care provider may be able to recommend support groups. Where to find more information You can find more information about exercising with COPD from: American Lung Association: lung.org COPD Foundation: copdfoundation.org Contact a health care provider if: Your symptoms get worse. You have nausea. You have a fever. You want to start a new exercise program or a new activity. Get help right away if: You have chest pain. You cannot breathe. These symptoms may represent a serious problem that is an emergency. Do not wait to see if the symptoms will go away. Get medical help right away. Call   your local emergency services (911 in the [place] Do not drive yourself to the hospital. Summary COPD is a general term that can be used to describe  many different lung problems that cause lung inflammation and limit airflow. This includes chronic bronchitis and emphysema. Exercise and physical activity improve your shortness of breath by increasing blood flow (circulation). This causes your heart to provide more oxygen to your body. Contact your health care provider before starting any exercise program or new activity. Ask your health care provider what exercises and activities are safe for you. This information is not intended to replace advice given to you by your health care provider. Make sure you discuss any questions you have with your health care provider. Document Revised: 09/24/2020 Document Reviewed: 09/24/2020 Elsevier Patient Education  2022 Elsevier Inc.  

## 2022-01-08 NOTE — Assessment & Plan Note (Signed)
Chronic, stable without inhalers at this time.  Recommend complete cessation of smoking.  Spirometry today noting FEV1 96% and FEV1/FVC 112% -- reviewed with patient.  Continue yearly lung screening.  Referral to cardiology due to abnormal EKG and possible pulmonary arterial hypertension.

## 2022-01-08 NOTE — Progress Notes (Signed)
BP 124/71    Pulse 61    Temp 98.3 F (36.8 C) (Oral)    Ht 5\' 6"  (1.676 m)    Wt 194 lb (88 kg)    SpO2 98%    BMI 31.31 kg/m    Subjective:    Patient ID: Sarah [name], female    DOB: 1950-12-13, 71 y.o.   MRN: [id number]  HPI: Sarah [name] is a 71 y.o. female  Chief Complaint  Patient presents with   Hyperlipidemia   Hypertension   Chronic Kidney Disease   COPD   Mood   RENAL CANCER STAGE III: Followed by Dr. Grayland Ormond, oncology, and urology.  Had left nephrectomy on January 01, 2017.  Requires abdominal/pelvic CT scan approximately every 6 months for 3 years after her nephrectomy, is scheduled for tomorrow.  Urology last 06/12/21 and oncology 01/10/21 -- goes for labs tomorrow morning with them and sees them next week.  CHRONIC KIDNEY DISEASE Recent labs 09/15/21 with CRT 1.90 and GFR 28, BUN 31, PTH 64, H/H 9.9/30.2.  Is followed by Dr. Candiss Norse.  CKD status: stable Medications renally dose: yes Previous renal evaluation: yes Pneumovax:  Up to Date Influenza Vaccine:  Up To Date  HYPERTENSION / HYPERLIPIDEMIA Continues on Amlodipine, Atenolol, Benazepril-HCTZ and Atorvastatin.   Satisfied with current treatment? yes Duration of hypertension: chronic BP monitoring frequency: not checking BP range: BP medication side effects: no Duration of hyperlipidemia: chronic Cholesterol medication side effects: no Cholesterol supplements: none Medication compliance: good compliance Aspirin: no Recent stressors: no Recurrent headaches: no Visual changes: no Palpitations: no Dyspnea: no Chest pain: no Lower extremity edema: no Dizzy/lightheaded: no    COPD Last lung CA screening on 08/29/21 noting centrilobular emphysema and aortic atherosclerosis + a stable 3 cm adrenal nodule.  No current inhalers.  Is a current every day smoker, about <1/2 PPD.  Has smoked since her teenage years.    Recent CT lung also noted possible pulmonary arterial hypertension -- she denies  palpitations, but does endorse if busy she does sometimes have to sit down and feel fatigued. COPD status: stable Satisfied with current treatment?: yes Oxygen use: no Dyspnea frequency: none Cough frequency: occasional, but not often Rescue inhaler frequency: none Limitation of activity: no Productive cough: none Last Spirometry: today with FEV1 96% and FEV1/FVC 112% Pneumovax: Up to Date Influenza: Up to Date   DEPRESSION Continues on Celexa 20 MG. Mood status: controlled Satisfied with current treatment?: yes Symptom severity: mild  Duration of current treatment : chronic Side effects: no Medication compliance: excellent compliance Psychotherapy/counseling: none Anxious mood: no Anhedonia: no Significant weight loss or gain: no Insomnia: none Fatigue: no Feelings of worthlessness or guilt: no Impaired concentration/indecisiveness: no Suicidal ideations: no Hopelessness: no Crying spells: no Depression screen [facility] 2/9 01/08/2022 07/09/2021 02/28/2021 01/08/2021 07/08/2020  Decreased Interest 0 0 0 0 0  Down, Depressed, Hopeless 0 0 0 0 0  PHQ - 2 Score 0 0 0 0 0  Altered sleeping 0 0 - 0 0  Tired, decreased energy 1 1 - 0 0  Change in appetite 0 0 - 0 1  Feeling bad or failure about yourself  0 0 - 0 0  Trouble concentrating 0 0 - 0 0  Moving slowly or fidgety/restless 0 0 - 0 0  Suicidal thoughts 0 0 - 0 0  PHQ-9 Score 1 1 - 0 1  Difficult doing work/chores - Not difficult at all - Not difficult at all Not difficult  at all  Some recent data might be hidden    GAD 7 : Generalized Anxiety Score 01/08/2022 12/28/2018  Nervous, Anxious, on Edge 0 0  Control/stop worrying 0 0  Worry too much - different things 0 0  Trouble relaxing 0 0  Restless 0 0  Easily annoyed or irritable 0 0  Afraid - awful might happen 0 0  Total GAD 7 Score 0 0  Anxiety Difficulty Not difficult at all Not difficult at all   Relevant past medical, surgical, family and social history reviewed and  updated as indicated. Interim medical history since our last visit reviewed. Allergies and medications reviewed and updated.  Review of Systems  Constitutional:  Negative for activity change, appetite change, diaphoresis, fatigue and fever.  Respiratory:  Negative for cough, chest tightness and shortness of breath.   Cardiovascular:  Negative for chest pain, palpitations and leg swelling.  Gastrointestinal: Negative.   Neurological:  Negative for dizziness, syncope, weakness, light-headedness, numbness and headaches.  Psychiatric/Behavioral: Negative.     Per HPI unless specifically indicated above     Objective:    BP 124/71    Pulse 61    Temp 98.3 F (36.8 C) (Oral)    Ht 5\' 6"  (1.676 m)    Wt 194 lb (88 kg)    SpO2 98%    BMI 31.31 kg/m   Wt Readings from Last 3 Encounters:  01/08/22 194 lb (88 kg)  08/27/21 197 lb (89.4 kg)  07/09/21 198 lb 3.2 oz (89.9 kg)    Physical Exam Vitals and nursing note reviewed.  Constitutional:      General: She is awake. She is not in acute distress.    Appearance: She is well-developed. She is obese. She is not ill-appearing.  HENT:     Head: Normocephalic.     Right Ear: Hearing normal.     Left Ear: Hearing normal.     Nose: Nose normal.     Mouth/Throat:     Mouth: Mucous membranes are moist.  Eyes:     General: Lids are normal.        Right eye: No discharge.        Left eye: No discharge.     Conjunctiva/sclera: Conjunctivae normal.     Pupils: Pupils are equal, round, and reactive to light.  Neck:     Vascular: No carotid bruit.  Cardiovascular:     Rate and Rhythm: Normal rate and regular rhythm.     Heart sounds: No murmur heard.   Gallop present. S3 sounds present.     Comments: S3 noted on auscultation -- refer to EKG. Pulmonary:     Effort: Pulmonary effort is normal.     Breath sounds: Normal breath sounds.  Abdominal:     General: Bowel sounds are normal.     Palpations: Abdomen is soft.  Musculoskeletal:      Cervical back: Normal range of motion and neck supple.     Right lower leg: No edema.     Left lower leg: No edema.  Skin:    General: Skin is warm and dry.  Neurological:     Mental Status: She is alert and oriented to person, place, and time.  Psychiatric:        Attention and Perception: Attention normal.        Mood and Affect: Mood normal.        Behavior: Behavior normal. Behavior is cooperative.  Thought Content: Thought content normal.        Judgment: Judgment normal.   EKG My review and personal interpretation at Time: 1130  Indication: abnormal HR  Rate: 70  Rhythm: sinus Axis: normal Other: no nonspecific st abn, no stemi, no lvh  Results for orders placed or performed in visit on 07/09/21  Lipid Panel w/o Chol/HDL Ratio  Result Value Ref Range   Cholesterol, Total 99 (L) 100 - 199 mg/dL   Triglycerides 45 0 - 149 mg/dL   HDL 41 >39 mg/dL   VLDL Cholesterol Cal 12 5 - 40 mg/dL   LDL Chol Calc (NIH) 46 0 - 99 mg/dL  TSH  Result Value Ref Range   TSH 1.920 0.450 - 4.500 uIU/mL      Assessment & Plan:   Problem List Items Addressed This Visit       Cardiovascular and Mediastinum   Aortic atherosclerosis ([place])    Noted on lung CT screening.  Continue statin daily and recommend modest weight loss + focus on healthy diet.  Complete cessation of smoking recommended.      Relevant Medications   benazepril-hydrochlorthiazide (LOTENSIN HCT) 20-25 MG tablet   atorvastatin (LIPITOR) 40 MG tablet   atenolol (TENORMIN) 100 MG tablet   amLODipine (NORVASC) 5 MG tablet   Hypertensive heart/kidney disease without HF and with CKD stage III (HCC)    Chronic, ongoing and followed by nephrology. BP at goal today, recommend she continue to check at home and monitor diet, DASH diet. Continue current medication regimen and adjust as needed + collaboration with nephrology and CA team.  Recent labs with nephrology obtained and reviewed today.  Return in 6 months.         Relevant Medications   benazepril-hydrochlorthiazide (LOTENSIN HCT) 20-25 MG tablet   atorvastatin (LIPITOR) 40 MG tablet   atenolol (TENORMIN) 100 MG tablet   amLODipine (NORVASC) 5 MG tablet     Respiratory   Pulmonary emphysema (HCC)    Chronic, stable without inhalers at this time.  Recommend complete cessation of smoking.  Spirometry today noting FEV1 96% and FEV1/FVC 112% -- reviewed with patient.  Continue yearly lung screening.  Referral to cardiology due to abnormal EKG and possible pulmonary arterial hypertension.      Relevant Orders   Spirometry with graph (Completed)     Genitourinary   Anemia in chronic kidney disease    Chronic, ongoing.  Followed by nephrology -- recent labs reviewed and note.      Chronic kidney disease, stage 3 (HCC)    Chronic, ongoing.  Recent labs obtained with nephrology.  Continue current medication regimen and collaboration with nephrology.   Recent labs and note reviewed today.        Renal cancer, left ([place]) - Primary    Followed by Dr. Grayland Ormond.  Continue this collaboration and review of notes.  Contacted via secure chat and requested lipid and Vit D be added to labs tomorrow.        Other   Abnormal EKG    Noted in office today -- CT recently with possible pulmonary hypertension -- referral to cardiology and echo ordered.      Relevant Orders   EKG 12-Lead (Completed)   Ambulatory referral to Cardiology   ECHOCARDIOGRAM COMPLETE   Depression    Chronic, ongoing.  Continue current medication regimen and adjust as needed.  Refills sent in as needed.  Denies SI/HI.        Relevant Medications  citalopram (CELEXA) 20 MG tablet   Hyperlipidemia    Chronic, ongoing.  Continue current medication regimen and adjust as needed.  Lipid panel tomorrow.       Relevant Medications   benazepril-hydrochlorthiazide (LOTENSIN HCT) 20-25 MG tablet   atorvastatin (LIPITOR) 40 MG tablet   atenolol (TENORMIN) 100 MG tablet   amLODipine  (NORVASC) 5 MG tablet   Other Relevant Orders   Lipid Panel w/o Chol/HDL Ratio   Nicotine dependence, cigarettes, uncomplicated    I have recommended complete cessation of tobacco use. I have discussed various options available for assistance with tobacco cessation including over the counter methods (Nicotine gum, patch and lozenges). We also discussed prescription options (Chantix, Nicotine Inhaler / Nasal Spray). The patient is not interested in pursuing any prescription tobacco cessation options at this time.       Obesity    BMI 31.31. Recommended eating smaller high protein, low fat meals more frequently and exercising 30 mins a day 5 times a week with a goal of 10-15lb weight loss in the next 3 months. Patient voiced their understanding and motivation to adhere to these recommendations.       Other Visit Diagnoses     Vitamin D deficiency       History of low levels, check today and initiate supplement as needed.   Relevant Orders   VITAMIN D 25 Hydroxy (Vit-D Deficiency, Fractures)        Follow up plan: Return in about 6 months (around 07/08/2022) for HTN/HLD, CKD, MOOD, CANCER.

## 2022-01-08 NOTE — Assessment & Plan Note (Signed)
Chronic, ongoing.  Recent labs obtained with nephrology.  Continue current medication regimen and collaboration with nephrology.   Recent labs and note reviewed today.

## 2022-01-08 NOTE — Assessment & Plan Note (Signed)
Chronic, ongoing.  Continue current medication regimen and adjust as needed.  Refills sent in as needed.  Denies SI/HI.   

## 2022-01-08 NOTE — Assessment & Plan Note (Signed)
I have recommended complete cessation of tobacco use. I have discussed various options available for assistance with tobacco cessation including over the counter methods (Nicotine gum, patch and lozenges). We also discussed prescription options (Chantix, Nicotine Inhaler / Nasal Spray). The patient is not interested in pursuing any prescription tobacco cessation options at this time.  

## 2022-01-08 NOTE — Assessment & Plan Note (Signed)
Noted on lung CT screening.  Continue statin daily and recommend modest weight loss + focus on healthy diet.  Complete cessation of smoking recommended.

## 2022-01-08 NOTE — Assessment & Plan Note (Signed)
Chronic, ongoing.  Followed by nephrology -- recent labs reviewed and note. 

## 2022-01-08 NOTE — Assessment & Plan Note (Signed)
Chronic, ongoing.  Continue current medication regimen and adjust as needed.  Lipid panel tomorrow.

## 2022-01-09 ENCOUNTER — Other Ambulatory Visit: Payer: Self-pay

## 2022-01-09 ENCOUNTER — Inpatient Hospital Stay: Payer: Medicare HMO | Attending: Oncology

## 2022-01-09 ENCOUNTER — Ambulatory Visit
Admission: RE | Admit: 2022-01-09 | Discharge: 2022-01-09 | Disposition: A | Discharge status: Home / Self Care | Payer: Medicare HMO | Source: Ambulatory Visit | Attending: Oncology | Admitting: Oncology

## 2022-01-09 DIAGNOSIS — C642 Malignant neoplasm of left kidney, except renal pelvis: Secondary | ICD-10-CM

## 2022-01-09 DIAGNOSIS — E559 Vitamin D deficiency, unspecified: Secondary | ICD-10-CM | POA: Insufficient documentation

## 2022-01-09 DIAGNOSIS — N189 Chronic kidney disease, unspecified: Secondary | ICD-10-CM | POA: Insufficient documentation

## 2022-01-09 DIAGNOSIS — Z905 Acquired absence of kidney: Secondary | ICD-10-CM | POA: Diagnosis not present

## 2022-01-09 DIAGNOSIS — F17210 Nicotine dependence, cigarettes, uncomplicated: Secondary | ICD-10-CM | POA: Insufficient documentation

## 2022-01-09 DIAGNOSIS — K5730 Diverticulosis of large intestine without perforation or abscess without bleeding: Secondary | ICD-10-CM | POA: Diagnosis not present

## 2022-01-09 DIAGNOSIS — N289 Disorder of kidney and ureter, unspecified: Secondary | ICD-10-CM | POA: Diagnosis not present

## 2022-01-09 DIAGNOSIS — D631 Anemia in chronic kidney disease: Secondary | ICD-10-CM | POA: Insufficient documentation

## 2022-01-09 DIAGNOSIS — R69 Illness, unspecified: Secondary | ICD-10-CM | POA: Diagnosis not present

## 2022-01-09 DIAGNOSIS — Z85528 Personal history of other malignant neoplasm of kidney: Secondary | ICD-10-CM | POA: Insufficient documentation

## 2022-01-09 DIAGNOSIS — Z79899 Other long term (current) drug therapy: Secondary | ICD-10-CM | POA: Insufficient documentation

## 2022-01-09 DIAGNOSIS — K8020 Calculus of gallbladder without cholecystitis without obstruction: Secondary | ICD-10-CM | POA: Diagnosis not present

## 2022-01-09 LAB — COMPREHENSIVE METABOLIC PANEL
ALT: 14 U/L (ref 0–44)
AST: 18 U/L (ref 15–41)
Albumin: 3.7 g/dL (ref 3.5–5.0)
Alkaline Phosphatase: 60 U/L (ref 38–126)
Anion gap: 10 (ref 5–15)
BUN: 43 mg/dL — ABNORMAL HIGH (ref 8–23)
CO2: 24 mmol/L (ref 22–32)
Calcium: 9.1 mg/dL (ref 8.9–10.3)
Chloride: 105 mmol/L (ref 98–111)
Creatinine, Ser: 2.12 mg/dL — ABNORMAL HIGH (ref 0.44–1.00)
GFR, Estimated: 25 mL/min — ABNORMAL LOW (ref >60)
Glucose, Bld: 92 mg/dL (ref 70–99)
Potassium: 4 mmol/L (ref 3.5–5.1)
Sodium: 139 mmol/L (ref 135–145)
Total Bilirubin: 0.2 mg/dL — ABNORMAL LOW (ref 0.3–1.2)
Total Protein: 7.4 g/dL (ref 6.5–8.1)

## 2022-01-09 LAB — CBC
HCT: 31.9 % — ABNORMAL LOW (ref 36.0–46.0)
Hemoglobin: 10.5 g/dL — ABNORMAL LOW (ref 12.0–15.0)
MCH: 29.8 pg (ref 26.0–34.0)
MCHC: 32.9 g/dL (ref 30.0–36.0)
MCV: 90.6 fL (ref 80.0–100.0)
Platelets: 258 10*3/uL (ref 150–400)
RBC: 3.52 MIL/uL — ABNORMAL LOW (ref 3.87–5.11)
RDW: 15 % (ref 11.5–15.5)
WBC: 6.6 10*3/uL (ref 4.0–10.5)
nRBC: 0 % (ref 0.0–0.2)

## 2022-01-09 LAB — LIPID PANEL
Cholesterol: 119 mg/dL (ref 0–200)
HDL: 41 mg/dL (ref >40)
LDL Cholesterol: 71 mg/dL (ref 0–99)
Total CHOL/HDL Ratio: 2.9 RATIO
Triglycerides: 34 mg/dL (ref <150)
VLDL: 7 mg/dL (ref 0–40)

## 2022-01-09 LAB — VITAMIN D 25 HYDROXY (VIT D DEFICIENCY, FRACTURES): Vit D, 25-Hydroxy: 53.9 ng/mL (ref 30–100)

## 2022-01-12 NOTE — Progress Notes (Signed)
Patient [facility]  Telephone:([phone] [phone]  ID: [name] OB: 1951/06/25  MR#: [id number]  ION#:[id number]  Patient Care Team: Venita Lick, NP as PCP - General (Nurse Practitioner) Nickie Retort, MD (Inactive) as Consulting Physician (Urology) Lloyd Huger, MD as Consulting Physician (Oncology)   CHIEF COMPLAINT: Stage III renal cell carcinoma, left.  INTERVAL HISTORY: Patient returns to clinic today for routine yearly evaluation and discussion of her imaging results.  She continues to feel well and remains asymptomatic.  She has no neurologic complaints. She denies any recent fevers or illnesses. She has a good appetite and denies weight loss.  She denies any chest pain, shortness of breath, cough, or hemoptysis.  She has no abdominal pain.  She denies any nausea, vomiting, constipation, or diarrhea. She has no urinary complaints.  Patient feels at her baseline and offers no specific complaints today.  REVIEW OF SYSTEMS:   Review of Systems  Constitutional: Negative.  Negative for fever, malaise/fatigue and weight loss.  Respiratory: Negative.  Negative for cough and shortness of breath.   Cardiovascular: Negative.  Negative for chest pain and leg swelling.  Gastrointestinal: Negative.  Negative for abdominal pain.  Genitourinary: Negative.  Negative for flank pain and hematuria.  Musculoskeletal: Negative.  Negative for back pain.  Skin: Negative.  Negative for rash.  Neurological: Negative.  Negative for sensory change, focal weakness and weakness.  Psychiatric/Behavioral: Negative.  The patient is not nervous/anxious.    As per HPI. Otherwise, a complete review of systems is negative.  PAST MEDICAL HISTORY: Past Medical History:  Diagnosis Date   Abnormal mammogram, unspecified    Anemia    Aneurysm ([place]) 2004   cerebral x  4, [facility]   Anxiety    Arthritis    Benign neoplasm of breast 2012   Breast  complaint 2010   Chronic kidney disease    Renal Cell Cancer   Hypertension    Obesity, unspecified    Personal history of tobacco use, presenting hazards to health    Renal cancer, left ([place]) 11/2016   Renal Cell with Left nephrectomy.   Special screening for malignant neoplasms, colon     PAST SURGICAL HISTORY: Past Surgical History:  Procedure Laterality Date   ABDOMINAL HYSTERECTOMY  1993   BREAST BIOPSY Right 2012   stereo bx showing a 42mm radial scar in an estimated 8 volume of tissue   BREAST BIOPSY Right Apr 09, 2005   Fibrocystic changes, ductal adenosis, microcalcifications, focal stromal/epithelial proliferation, possible fibroadenoma type proliferation   CEREBRAL ANEURYSM REPAIR  2004   COLONOSCOPY  2008   Dr. Nicolasa Ducking   EYE SURGERY     FOOT SURGERY Right 2003   LAPAROSCOPIC NEPHRECTOMY, HAND ASSISTED Left 01/01/2017   Procedure: HAND ASSISTED LAPAROSCOPIC NEPHRECTOMY;  Surgeon: Nickie Retort, MD;  Location: [facility] ORS;  Service: Urology;  Laterality: Left;   TONSILLECTOMY  1997    FAMILY HISTORY: Family History  Problem Relation Age of Onset   Cancer Cousin        maternal first cousin with breast cancer   Cancer Other        colon and ovarian cancers, no relationship listed   Cancer Father        prostate   Hypertension Father    Hypertension Sister    Hypertension Brother    Hypertension Paternal Grandmother    Heart disease Paternal Grandmother    Hypertension Paternal Merchant [employer] officer  Heart disease Paternal Grandfather    Breast cancer Neg Hx     ADVANCED DIRECTIVES (Y/N):  N  HEALTH MAINTENANCE: Social History   Tobacco Use   Smoking status: Every Day    Packs/day: 0.25    Years: 40.00    Pack years: 10.00    Types: Cigarettes   Smokeless tobacco: Never  Vaping Use   Vaping Use: Never used  Substance Use Topics   Alcohol use: Yes    Comment: socially   Drug use: No     Colonoscopy:  PAP:  Bone density:  Lipid panel:  Allergies   Allergen Reactions   Codeine Itching    Current Outpatient Medications  Medication Sig Dispense Refill   acetaminophen (TYLENOL) 325 MG tablet Take 650 mg by mouth every 6 (six) hours as needed for mild pain, moderate pain or headache.      amLODipine (NORVASC) 5 MG tablet Take 1 tablet (5 mg total) by mouth daily. 90 tablet 4   atenolol (TENORMIN) 100 MG tablet Take 1 tablet (100 mg total) by mouth daily. 90 tablet 4   atorvastatin (LIPITOR) 40 MG tablet Take 1 tablet (40 mg total) by mouth daily. 90 tablet 4   benazepril-hydrochlorthiazide (LOTENSIN HCT) 20-25 MG tablet Take 1 tablet by mouth daily. 90 tablet 4   Cholecalciferol (VITAMIN D-3 PO) Take 600 Units by mouth daily.     citalopram (CELEXA) 20 MG tablet Take 1 tablet (20 mg total) by mouth daily. 90 tablet 4   Loperamide HCl (IMODIUM PO) Take by mouth. PRN     Multiple Vitamin (MULTIVITAMIN) tablet Take 1 tablet by mouth daily.     solifenacin (VESICARE) 5 MG tablet Take 5 mg by mouth daily.     traMADol (ULTRAM) 50 MG tablet Take 1 tablet (50 mg total) by mouth every 12 (twelve) hours as needed. 14 tablet 0   No current facility-administered medications for this visit.    OBJECTIVE: Vitals:   01/15/22 1110  BP: 124/71  Pulse: 61  Temp: 98.3 F (36.8 C)     Body mass index is 30.83 kg/m.    ECOG FS:0 - Asymptomatic  General: Well-developed, well-nourished, no acute distress. Eyes: Pink conjunctiva, anicteric sclera. HEENT: Normocephalic, moist mucous membranes. Lungs: No audible wheezing or coughing. Heart: Regular rate and rhythm. Abdomen: Soft, nontender, no obvious distention. Musculoskeletal: No edema, cyanosis, or clubbing. Neuro: Alert, answering all questions appropriately. Cranial nerves grossly intact. Skin: No rashes or petechiae noted. Psych: Normal affect.  LAB RESULTS:  Lab Results  Component Value Date   NA 139 01/09/2022   K 4.0 01/09/2022   CL 105 01/09/2022   CO2 24 01/09/2022   GLUCOSE  92 01/09/2022   BUN 43 (H) 01/09/2022   CREATININE 2.12 (H) 01/09/2022   CALCIUM 9.1 01/09/2022   PROT 7.4 01/09/2022   ALBUMIN 3.7 01/09/2022   AST 18 01/09/2022   ALT 14 01/09/2022   ALKPHOS 60 01/09/2022   BILITOT 0.2 (L) 01/09/2022   GFRNONAA 25 (L) 01/09/2022   GFRAA 36 (L) 01/03/2020    Lab Results  Component Value Date   WBC 6.6 01/09/2022   NEUTROABS 3.6 05/17/2019   HGB 10.5 (L) 01/09/2022   HCT 31.9 (L) 01/09/2022   MCV 90.6 01/09/2022   PLT 258 01/09/2022     STUDIES: CT Abdomen Pelvis Wo Contrast  Result Date: 01/10/2022 CLINICAL DATA:  Follow-up for left RCC status post nephrectomy. EXAM: CT ABDOMEN AND PELVIS WITHOUT CONTRAST TECHNIQUE: Multidetector CT  imaging of the abdomen and pelvis was performed following the standard protocol without IV contrast. RADIATION DOSE REDUCTION: This exam was performed according to the departmental dose-optimization program which includes automated exposure control, adjustment of the mA and/or kV according to patient size and/or use of iterative reconstruction technique. COMPARISON:  Multiple priors including most recent CT January 09, 2021 FINDINGS: Lower chest: No acute abnormality. Hepatobiliary: Unremarkable noncontrast appearance of the hepatic parenchyma. Cholelithiasis without findings of acute cholecystitis. No biliary ductal dilation. Pancreas: No pancreatic ductal dilation or evidence of acute inflammation. Spleen: Normal in size without focal abnormality. Adrenals/Urinary Tract: Bilateral adrenal glands appear normal. Prior left nephrectomy without new nodularity in the nephrectomy bed. There are numerous right-sided hypo and hyperdense renal lesions the largest hypodense lesion is in the lower pole measuring 4.5 cm on image 25/2, unchanged. The largest hyperdense lesion is in the upper pole measuring 4.9 cm previously 4.8 cm when remeasured for consistency. While none of the lesions appear significantly changed to prior imaging  these are incompletely evaluated on this noncontrast study. Urinary bladder is decompressed limiting evaluation. Stomach/Bowel: Radiopaque enteric contrast material traverses the hepatic flexure. Stomach is unremarkable for degree of distension. No pathologic dilation of small or large bowel. Terminal ileum appears normal. Normal appendix. Left-sided colonic diverticulosis without findings of acute diverticulitis. Vascular/Lymphatic: Aortic atherosclerosis without abdominal aortic aneurysm. No pathologically enlarged abdominal or pelvic lymph nodes identified. Reproductive: Status post hysterectomy. No adnexal masses. Other: No significant abdominopelvic free fluid. Musculoskeletal: Multilevel degenerative changes spine. No aggressive lytic or blastic lesion of bone. IMPRESSION: 1. Prior left nephrectomy without evidence of local recurrence or abdominopelvic metastatic disease on this noncontrast examination. 2. Numerous right-sided hypo and hyperdense renal lesions, the largest of which is in the upper pole measuring 4.9 cm previously 4.8 cm. While none of the lesions appear significantly changed to prior imaging, and they may reflect a combination of fluid attenuation and hemorrhagic/proteinaceous cysts these are incompletely evaluated on this noncontrast study and underlying renal neoplasm is not excluded. 3. Cholelithiasis without findings of acute cholecystitis. 4. Left-sided colonic diverticulosis without findings of acute diverticulitis. 5.  Aortic Atherosclerosis (ICD10-I70.0). Electronically Signed   By: Dahlia Bailiff M.D.   On: 01/10/2022 18:08     ASSESSMENT: Stage III renal cell carcinoma, left.  PLAN:  1. Stage III renal cell carcinoma, left: Patient underwent nephrectomy on January 01, 2017.  She did not have any adjuvant treatment.  CT scan results from January 10, 2022 reviewed independently and report as above with no obvious evidence of recurrent or progressive disease.  Patient is now  over 5 years removed from her surgery and can be discharged from clinic.  She reports she has continued close follow-up with both urology and nephrology.  Please refer patient back if there are any questions or concerns.   2. Anemia: Chronic and unchanged.  Patient's most recent hemoglobin is 10.5.  She does not require Retacrit at this time, but if her hemoglobin falls persistently below 10.0 she may benefit from this in the future. 3. Chronic renal insufficiency: Patient's most recent creatinine has increased to 2.12.  Continue follow-up with nephrology as above.  Patient expressed understanding and was in agreement with this plan. She also understands that She can call clinic at any time with any questions, concerns, or complaints.    Cancer Staging  Renal cancer, left [facility]) Staging form: Kidney, AJCC 8th Edition - Clinical stage from 01/26/2017: Stage III (cT3a, cNX, cM0) - Signed by Grayland Ormond,  Kathlene November, MD on 01/26/2017 Histologic grade (G): G2 Histologic grading system: 4 grade system Laterality: Left Histologic sub-type: Clear cell   Lloyd Huger, MD   01/16/2022 2:34 PM

## 2022-01-15 ENCOUNTER — Inpatient Hospital Stay: Payer: Medicare HMO | Admitting: Oncology

## 2022-01-15 ENCOUNTER — Encounter: Payer: Self-pay | Admitting: Oncology

## 2022-01-15 ENCOUNTER — Other Ambulatory Visit: Payer: Self-pay

## 2022-01-15 VITALS — BP 124/71 | HR 61 | Temp 98.3°F | Wt 191.0 lb

## 2022-01-15 DIAGNOSIS — R69 Illness, unspecified: Secondary | ICD-10-CM | POA: Diagnosis not present

## 2022-01-15 DIAGNOSIS — E559 Vitamin D deficiency, unspecified: Secondary | ICD-10-CM | POA: Diagnosis not present

## 2022-01-15 DIAGNOSIS — C642 Malignant neoplasm of left kidney, except renal pelvis: Secondary | ICD-10-CM

## 2022-01-15 DIAGNOSIS — Z79899 Other long term (current) drug therapy: Secondary | ICD-10-CM | POA: Diagnosis not present

## 2022-01-15 DIAGNOSIS — N189 Chronic kidney disease, unspecified: Secondary | ICD-10-CM | POA: Diagnosis not present

## 2022-01-15 DIAGNOSIS — Z85528 Personal history of other malignant neoplasm of kidney: Secondary | ICD-10-CM | POA: Diagnosis not present

## 2022-01-15 DIAGNOSIS — D631 Anemia in chronic kidney disease: Secondary | ICD-10-CM | POA: Diagnosis not present

## 2022-01-15 DIAGNOSIS — Z905 Acquired absence of kidney: Secondary | ICD-10-CM | POA: Diagnosis not present

## 2022-01-19 DIAGNOSIS — D631 Anemia in chronic kidney disease: Secondary | ICD-10-CM | POA: Diagnosis not present

## 2022-01-19 DIAGNOSIS — N184 Chronic kidney disease, stage 4 (severe): Secondary | ICD-10-CM | POA: Diagnosis not present

## 2022-01-19 DIAGNOSIS — I10 Essential (primary) hypertension: Secondary | ICD-10-CM | POA: Diagnosis not present

## 2022-01-19 DIAGNOSIS — Z905 Acquired absence of kidney: Secondary | ICD-10-CM | POA: Diagnosis not present

## 2022-01-19 DIAGNOSIS — N281 Cyst of kidney, acquired: Secondary | ICD-10-CM | POA: Diagnosis not present

## 2022-01-23 ENCOUNTER — Other Ambulatory Visit: Payer: Self-pay | Admitting: Nurse Practitioner

## 2022-01-23 DIAGNOSIS — I272 Pulmonary hypertension, unspecified: Secondary | ICD-10-CM

## 2022-02-03 ENCOUNTER — Ambulatory Visit (INDEPENDENT_AMBULATORY_CARE_PROVIDER_SITE_OTHER): Payer: Medicare HMO

## 2022-02-03 ENCOUNTER — Other Ambulatory Visit: Payer: Self-pay

## 2022-02-03 DIAGNOSIS — I272 Pulmonary hypertension, unspecified: Secondary | ICD-10-CM | POA: Diagnosis not present

## 2022-02-04 LAB — ECHOCARDIOGRAM COMPLETE
AR max vel: 2.07 cm2
AV Area VTI: 2.2 cm2
AV Area mean vel: 2.18 cm2
AV Mean grad: 4 mmHg
AV Peak grad: 7.4 mmHg
Ao pk vel: 1.36 m/s
Area-P 1/2: 2.97 cm2
Calc EF: 49 %
S' Lateral: 4.5 cm
Single Plane A2C EF: 49.2 %
Single Plane A4C EF: 46.5 %

## 2022-02-04 NOTE — Progress Notes (Signed)
Contacted via [place] ? ? ?Good evening Sarah [name], your heart echo has returned.  Overall pump is doing well, but there is some mild impairment of the relaxation of heart.  There is also some moderately elevated pressure in the pulmonary (lung) artery.  You also have some regurgitation (backflow) of blood at mitral valve and tricuspid valve of heart, these are like the doorways that let blood in and out.  Keep appointment upcoming with Dr. Rockey Situ and we will see there recommendations.  Any questions? ?Keep being stellar!!  Thank you for allowing me to participate in your care.  I appreciate you. ?Kindest regards, ?Vanita Cannell ?

## 2022-02-06 ENCOUNTER — Ambulatory Visit: Payer: Medicare HMO | Admitting: Cardiovascular Disease

## 2022-02-06 ENCOUNTER — Encounter: Payer: Self-pay | Admitting: Cardiovascular Disease

## 2022-02-06 ENCOUNTER — Other Ambulatory Visit: Payer: Self-pay

## 2022-02-06 VITALS — BP 138/72 | HR 64 | Ht 66.0 in | Wt 195.5 lb

## 2022-02-06 DIAGNOSIS — I071 Rheumatic tricuspid insufficiency: Secondary | ICD-10-CM | POA: Diagnosis not present

## 2022-02-06 DIAGNOSIS — I340 Nonrheumatic mitral (valve) insufficiency: Secondary | ICD-10-CM | POA: Diagnosis not present

## 2022-02-06 DIAGNOSIS — I700 Atherosclerosis of aorta: Secondary | ICD-10-CM

## 2022-02-06 DIAGNOSIS — I1310 Hypertensive heart and chronic kidney disease without heart failure, with stage 1 through stage 4 chronic kidney disease, or unspecified chronic kidney disease: Secondary | ICD-10-CM | POA: Diagnosis not present

## 2022-02-06 DIAGNOSIS — I2510 Atherosclerotic heart disease of native coronary artery without angina pectoris: Secondary | ICD-10-CM | POA: Diagnosis not present

## 2022-02-06 DIAGNOSIS — N1832 Chronic kidney disease, stage 3b: Secondary | ICD-10-CM

## 2022-02-06 DIAGNOSIS — I272 Pulmonary hypertension, unspecified: Secondary | ICD-10-CM | POA: Diagnosis not present

## 2022-02-06 NOTE — Progress Notes (Signed)
Cardiology Office Note  Date:  02/06/2022   ID:  Sarah [name], DOB Jan 24, 1951, MRN [id number]  PCP:  Venita Lick, NP   Chief Complaint  Patient presents with   New Patient (Initial Visit)    Ref by Marnee Guarneri, NP for abnormal EKG and discuss Echo. Medications reviewed by the patient verbally.     HPI:  [name] is a 71 year old woman with past medical history of Chronic kidney disease, CR 2.1 Left nephrectomy stage III renal cell cancer 2018 Hypertension Smoker Emphysema Coronary artery disease, coronary calcification noted on CT scan Aortic atherosclerosis Presenting by referral from [facility] for for consultation of abnormal EKG, mitral valve regurgitation, tricuspid valve regurgitation, moderately elevated right heart pressures, renal failure  On discussions today, reports that she is doing relatively well, Discussed her chronic kidney disease, had nephrectomy on left 5 years ago  Lab work reviewed, slow worsening of renal function most recent creatinine greater than 2  Tires out, high fluid intake, pink lemonade, water among other drinks  Takes HCTZ daily Has mild lower extremity edema  \EKG personally reviewed by myself on todays visit Normal sinus rhythm rate 65 bpm PVCs  Echo, pulled up and reviewed with her 1. Left ventricular ejection fraction, by estimation, is 50 to 55%. The  left ventricle has low normal function. The left ventricle has no regional  wall motion abnormalities. Left ventricular diastolic parameters are  consistent with Grade I diastolic  dysfunction (impaired relaxation).   2. Right ventricular systolic function is normal. The right ventricular  size is normal. There is moderately elevated pulmonary artery systolic  pressure. The estimated right ventricular systolic pressure is 28.0 mmHg.   3. Left atrial size was moderately dilated.   4. The mitral valve is normal in structure. Moderate mitral valve  regurgitation.    5. Tricuspid valve regurgitation is moderate.    PMH:   has a past medical history of Abnormal mammogram, unspecified, Anemia, Aneurysm ([place]) (2004), Anxiety, Arthritis, Benign neoplasm of breast (2012), Breast complaint (2010), Chronic kidney disease, Hypertension, Obesity, unspecified, Personal history of tobacco use, presenting hazards to health, Renal cancer, left ([place]) (11/2016), and Special screening for malignant neoplasms, colon.  PSH:    Past Surgical History:  Procedure Laterality Date   ABDOMINAL HYSTERECTOMY  1993   BREAST BIOPSY Right 2012   stereo bx showing a 36m radial scar in an estimated 8 volume of tissue   BREAST BIOPSY Right Apr 09, 2005   Fibrocystic changes, ductal adenosis, microcalcifications, focal stromal/epithelial proliferation, possible fibroadenoma type proliferation   CEREBRAL ANEURYSM REPAIR  2004   COLONOSCOPY  2008   Dr. KNicolasa Ducking  EYE SURGERY     FOOT SURGERY Right 2003   LAPAROSCOPIC NEPHRECTOMY, HAND ASSISTED Left 01/01/2017   Procedure: HAND ASSISTED LAPAROSCOPIC NEPHRECTOMY;  Surgeon: BNickie Retort MD;  Location: [facility] ORS;  Service: Urology;  Laterality: Left;   TONSILLECTOMY  1997    Current Outpatient Medications  Medication Sig Dispense Refill   acetaminophen (TYLENOL) 325 MG tablet Take 650 mg by mouth every 6 (six) hours as needed for mild pain, moderate pain or headache.      amLODipine (NORVASC) 5 MG tablet Take 1 tablet (5 mg total) by mouth daily. 90 tablet 4   atenolol (TENORMIN) 100 MG tablet Take 1 tablet (100 mg total) by mouth daily. 90 tablet 4   atorvastatin (LIPITOR) 40 MG tablet Take 1 tablet (40 mg total) by mouth daily. 90 tablet  4   benazepril-hydrochlorthiazide (LOTENSIN HCT) 20-25 MG tablet Take 1 tablet by mouth daily. 90 tablet 4   Cholecalciferol (VITAMIN D-3 PO) Take 600 Units by mouth daily.     citalopram (CELEXA) 20 MG tablet Take 1 tablet (20 mg total) by mouth daily. 90 tablet 4   ferrous sulfate 325 (65 FE) MG  tablet Take 325 mg by mouth 3 (three) times a week.     Loperamide HCl (IMODIUM PO) Take by mouth. PRN     Multiple Vitamin (MULTIVITAMIN) tablet Take 1 tablet by mouth daily.     solifenacin (VESICARE) 5 MG tablet Take 5 mg by mouth daily.     traMADol (ULTRAM) 50 MG tablet Take 1 tablet (50 mg total) by mouth every 12 (twelve) hours as needed. 14 tablet 0   No current facility-administered medications for this visit.    Allergies:   Codeine   Social History:  The patient  reports that she has been smoking cigarettes. She has a 10.00 pack-year smoking history. She has never used smokeless tobacco. She reports current alcohol use. She reports that she does not use drugs.   Family History:   family history includes Cancer in her cousin, father, and another family member; Heart disease in her paternal grandfather and paternal grandmother; Hypertension in her brother, father, paternal grandfather, paternal grandmother, and sister.    Review of Systems: Review of Systems  Constitutional:  Positive for malaise/fatigue.  HENT: Negative.    Respiratory:  Positive for shortness of breath.   Cardiovascular:  Positive for leg swelling.  Gastrointestinal: Negative.   Musculoskeletal: Negative.   Neurological: Negative.   Psychiatric/Behavioral: Negative.    All other systems reviewed and are negative.   PHYSICAL EXAM: VS:  BP 138/72 (BP Location: Right Arm, Patient Position: Sitting, Cuff Size: Normal)    Pulse 64    Ht '5\' 6"'$  (1.676 m)    Wt 195 lb 8 oz (88.7 kg)    SpO2 98%    BMI 31.55 kg/m  , BMI Body mass index is 31.55 kg/m. GEN: Well nourished, well developed, in no acute distress HEENT: normal Neck: no JVD, carotid bruits, or masses Cardiac: RRR; no murmurs, rubs, or gallops, 1+ pitting lower extremity edema Respiratory:  clear to auscultation bilaterally, normal work of breathing GI: soft, nontender, nondistended, + BS MS: no deformity or atrophy Skin: warm and dry, no  rash Neuro:  Strength and sensation are intact Psych: euthymic mood, full affect  Recent Labs: 07/09/2021: TSH 1.920 01/09/2022: ALT 14; BUN 43; Creatinine, Ser 2.12; Hemoglobin 10.5; Platelets 258; Potassium 4.0; Sodium 139    Lipid Panel Lab Results  Component Value Date   CHOL 119 01/09/2022   HDL 41 01/09/2022   LDLCALC 71 01/09/2022   TRIG 34 01/09/2022      Wt Readings from Last 3 Encounters:  02/06/22 195 lb 8 oz (88.7 kg)  01/15/22 191 lb (86.6 kg)  01/08/22 194 lb (88 kg)       ASSESSMENT AND PLAN:  Problem List Items Addressed This Visit       Cardiology Problems   Hypertensive heart/kidney disease without HF and with CKD stage III ([place]) - Primary   Relevant Orders   EKG 12-Lead   Aortic atherosclerosis (HCC)   Coronary arteriosclerosis   Relevant Orders   EKG 12-Lead     Other   Chronic kidney disease, stage 3 ([place])   Other Visit Diagnoses     Pulmonary hypertension, unspecified ([place])  Tricuspid valve insufficiency, unspecified etiology       Mitral valve insufficiency, unspecified etiology          Pulmonary hypertension/mitral valve and tricuspid valve regurgitation Long discussion concerning echocardiogram findings I am concerned that her underlying renal dysfunction is contributing to fluid retention resulting in mitral valve and tricuspid valve regurgitations, elevated right heart pressures Unable to exclude diastolic CHF Currently on HCTZ 25 daily She may benefit from Lasix though would like to discuss with Dr. Candiss Norse her nephrologist Would need to proceed cautiously given worsening renal function over the past 2 years, creatinine now greater than 2  aortic atherosclerosis Very mild disease noted Smoking cessation recommended Already on a statin  Coronary calcification Minimal calcification noted, no ischemic work-up needed at this time  Smoking We have encouraged her to continue to work on weaning her cigarettes and smoking  cessation. She will continue to work on this and does not want any assistance with chantix.     Total encounter time more than 60 minutes  Greater than 50% was spent in counseling and coordination of care with the patient    Signed, Esmond Plants, M.D., Ph.D. [place], [place]

## 2022-02-06 NOTE — Patient Instructions (Signed)
Medication Instructions:  No changes  If you need a refill on your cardiac medications before your next appointment, please call your pharmacy.    Lab work: No new labs needed   Testing/Procedures: No new testing needed   Follow-Up: At [facility], you and your health needs are our priority.  As part of our continuing mission to provide you with exceptional heart care, we have created designated Provider Care Teams.  These Care Teams include your primary Cardiologist (physician) and Advanced Practice Providers (APPs -  Physician Assistants and Nurse Practitioners) who all work together to provide you with the care you need, when you need it.  You will need a follow up appointment in 6 months  Providers on your designated Care Team:   Christopher Berge, NP Ryan Dunn, PA-C Cadence Furth, PA-C  COVID-19 Vaccine Information can be found at: [email or web address] For questions related to vaccine distribution or appointments, please email [email or web address] or call [phone].   

## 2022-02-23 ENCOUNTER — Telehealth: Payer: Self-pay | Admitting: Emergency Medicine

## 2022-02-23 DIAGNOSIS — I1310 Hypertensive heart and chronic kidney disease without heart failure, with stage 1 through stage 4 chronic kidney disease, or unspecified chronic kidney disease: Secondary | ICD-10-CM

## 2022-02-23 MED ORDER — FUROSEMIDE 40 MG PO TABS: 40.0000 mg | ORAL_TABLET | Freq: Every day | ORAL | 3 refills | Status: DC

## 2022-02-23 NOTE — Telephone Encounter (Signed)
Patient returning call.

## 2022-02-23 NOTE — Addendum Note (Signed)
Addended by: Mila Merry on: 02/23/2022 01:45 PM ? ? Modules accepted: Orders ? ?

## 2022-02-23 NOTE — Telephone Encounter (Signed)
Spoke with patient.  ? ?Went over recommendations. Pt verbalized understanding.  ? ?Lasix sent into patient's preferred pharmacy. States that she doesn't get off work until 9pm so she will pick it up tomorrow.  ? ?Lab appointment scheduled for 1 month out.  ?

## 2022-02-23 NOTE — Telephone Encounter (Signed)
Called patient to go over recommendations. No answer, lmtcb.  ?

## 2022-02-23 NOTE — Telephone Encounter (Signed)
-----   Message from Minna Merritts, MD sent at 02/22/2022  4:23 PM EDT ----- ?Regarding: FW: ?Received word back from nephrology to hold HCTZ, start Lasix 40 daily ?Would need a BMP in 1 month or follow-up with nephrology. ?Sees Dr. Candiss Norse ?Thx ?TGollan ? ? ? ?----- Message ----- ?From: Murlean Iba, MD ?Sent: 02/07/2022  10:32 AM EDT ?To: Minna Merritts, MD, Venita Lick, NP ?Subject: RE:                                           ? ?I think it is reasonable to try lasix. Perhaps start at low dose of 40 mg daily. ?If BP drops below 130 would hold amlodipine. ?----- Message ----- ?From: Minna Merritts, MD ?Sent: 02/06/2022   7:19 PM EST ?To: Murlean Iba, MD, Venita Lick, NP ? ?Seen in clinic, echocardiogram reviewed with her showing moderate mitral valve and tricuspid valve regurgitation, moderately elevated right heart pressures.  I am concerned the worsening renal function over the past 2 years may be contributing creatinine now greater than 2, single kidney.  Ideally would like to treat with Lasix.  She is on HCTZ.  Wanted to get everybody's input, we would need to watch renal function closely. Thx ?TGollan ? ?

## 2022-02-25 ENCOUNTER — Telehealth: Payer: Self-pay | Admitting: Cardiovascular Disease

## 2022-02-25 NOTE — Telephone Encounter (Signed)
Called and spoke with patient.  ? ?Confirmed that she is to stop the lotensin HCT.  ? ?Pt verbalized understanding and voiced appreciation for the call.  ?

## 2022-02-25 NOTE — Telephone Encounter (Signed)
Pt c/o medication issue: ? ?1. Name of Medication: furosemide ? ?2. How are you currently taking this medication (dosage and times per day)? New to patient about to start 40 mg po q d  ? ?3. Are you having a reaction (difficulty breathing--STAT)? no ? ?4. What is your medication issue? Patient wants to confirm which med to dc before starting this new one  ? ?

## 2022-03-02 ENCOUNTER — Ambulatory Visit (INDEPENDENT_AMBULATORY_CARE_PROVIDER_SITE_OTHER): Payer: Medicare HMO | Admitting: *Deleted

## 2022-03-02 DIAGNOSIS — Z0000 Encounter for general adult medical examination without abnormal findings: Secondary | ICD-10-CM

## 2022-03-02 NOTE — Patient Instructions (Signed)
Ms. Dacosta , ?Thank you for taking time to come for your Medicare Wellness Visit. I appreciate your ongoing commitment to your health goals. Please review the following plan we discussed and let me know if I can assist you in the future.  ? ?Screening recommendations/referrals: ?Colonoscopy: up to date ?Mammogram: up to date ?Bone Density: up to date ?Recommended yearly ophthalmology/optometry visit for glaucoma screening and checkup ?Recommended yearly dental visit for hygiene and checkup ? ?Vaccinations: ?Influenza vaccine: up to date ?Pneumococcal vaccine: up to date ?Tdap vaccine: up to date ?Shingles vaccine: up to date   ? ?Advanced directives: Education provided ? ?Conditions/risks identified:  ? ? ? ? ?Preventive Care 70 Years and Older, Female ?Preventive care refers to lifestyle choices and visits with your health care provider that can promote health and wellness. ?What does preventive care include? ?A yearly physical exam. This is also called an annual well check. ?Dental exams once or twice a year. ?Routine eye exams. Ask your health care provider how often you should have your eyes checked. ?Personal lifestyle choices, including: ?Daily care of your teeth and gums. ?Regular physical activity. ?Eating a healthy diet. ?Avoiding tobacco and drug use. ?Limiting alcohol use. ?Practicing safe sex. ?Taking low-dose aspirin every day. ?Taking vitamin and mineral supplements as recommended by your health care provider. ?What happens during an annual well check? ?The services and screenings done by your health care provider during your annual well check will depend on your age, overall health, lifestyle risk factors, and family history of disease. ?Counseling  ?Your health care provider may ask you questions about your: ?Alcohol use. ?Tobacco use. ?Drug use. ?Emotional well-being. ?Home and relationship well-being. ?Sexual activity. ?Eating habits. ?History of falls. ?Memory and ability to understand  (cognition). ?Work and work Statistician. ?Reproductive health. ?Screening  ?You may have the following tests or measurements: ?Height, weight, and BMI. ?Blood pressure. ?Lipid and cholesterol levels. These may be checked every 5 years, or more frequently if you are over 30 years old. ?Skin check. ?Lung cancer screening. You may have this screening every year starting at age 17 if you have a 30-pack-year history of smoking and currently smoke or have quit within the past 15 years. ?Fecal occult blood test (FOBT) of the stool. You may have this test every year starting at age 25. ?Flexible sigmoidoscopy or colonoscopy. You may have a sigmoidoscopy every 5 years or a colonoscopy every 10 years starting at age 60. ?Hepatitis C blood test. ?Hepatitis B blood test. ?Sexually transmitted disease (STD) testing. ?Diabetes screening. This is done by checking your blood sugar (glucose) after you have not eaten for a while (fasting). You may have this done every 1-3 years. ?Bone density scan. This is done to screen for osteoporosis. You may have this done starting at age 5. ?Mammogram. This may be done every 1-2 years. Talk to your health care provider about how often you should have regular mammograms. ?Talk with your health care provider about your test results, treatment options, and if necessary, the need for more tests. ?Vaccines  ?Your health care provider may recommend certain vaccines, such as: ?Influenza vaccine. This is recommended every year. ?Tetanus, diphtheria, and acellular pertussis (Tdap, Td) vaccine. You may need a Td booster every 10 years. ?Zoster vaccine. You may need this after age 48. ?Pneumococcal 13-valent conjugate (PCV13) vaccine. One dose is recommended after age 50. ?Pneumococcal polysaccharide (PPSV23) vaccine. One dose is recommended after age 79. ?Talk to your health care provider about which screenings and  vaccines you need and how often you need them. ?This information is not intended to  replace advice given to you by your health care provider. Make sure you discuss any questions you have with your health care provider. ?Document Released: 12/13/2015 Document Revised: 08/05/2016 Document Reviewed: 09/17/2015 ?Elsevier Interactive Patient Education ? 2017 [place]. ? ?Fall Prevention in the Home ?Falls can cause injuries. They can happen to people of all ages. There are many things you can do to make your home safe and to help prevent falls. ?What can I do on the outside of my home? ?Regularly fix the edges of walkways and driveways and fix any cracks. ?Remove anything that might make you trip as you walk through a door, such as a raised step or threshold. ?Trim any bushes or trees on the path to your home. ?Use bright outdoor lighting. ?Clear any walking paths of anything that might make someone trip, such as rocks or tools. ?Regularly check to see if handrails are loose or broken. Make sure that both sides of any steps have handrails. ?Any raised decks and porches should have guardrails on the edges. ?Have any leaves, snow, or ice cleared regularly. ?Use sand or salt on walking paths during winter. ?Clean up any spills in your garage right away. This includes oil or grease spills. ?What can I do in the bathroom? ?Use night lights. ?Install grab bars by the toilet and in the tub and shower. Do not use towel bars as grab bars. ?Use non-skid mats or decals in the tub or shower. ?If you need to sit down in the shower, use a plastic, non-slip stool. ?Keep the floor dry. Clean up any water that spills on the floor as soon as it happens. ?Remove soap buildup in the tub or shower regularly. ?Attach bath mats securely with double-sided non-slip rug tape. ?Do not have throw rugs and other things on the floor that can make you trip. ?What can I do in the bedroom? ?Use night lights. ?Make sure that you have a light by your bed that is easy to reach. ?Do not use any sheets or blankets that are too big for  your bed. They should not hang down onto the floor. ?Have a firm chair that has side arms. You can use this for support while you get dressed. ?Do not have throw rugs and other things on the floor that can make you trip. ?What can I do in the kitchen? ?Clean up any spills right away. ?Avoid walking on wet floors. ?Keep items that you use a lot in easy-to-reach places. ?If you need to reach something above you, use a strong step stool that has a grab bar. ?Keep electrical cords out of the way. ?Do not use floor polish or wax that makes floors slippery. If you must use wax, use non-skid floor wax. ?Do not have throw rugs and other things on the floor that can make you trip. ?What can I do with my stairs? ?Do not leave any items on the stairs. ?Make sure that there are handrails on both sides of the stairs and use them. Fix handrails that are broken or loose. Make sure that handrails are as long as the stairways. ?Check any carpeting to make sure that it is firmly attached to the stairs. Fix any carpet that is loose or worn. ?Avoid having throw rugs at the top or bottom of the stairs. If you do have throw rugs, attach them to the floor with carpet tape. ?Make  sure that you have a light switch at the top of the stairs and the bottom of the stairs. If you do not have them, ask someone to add them for you. ?What else can I do to help prevent falls? ?Wear shoes that: ?Do not have high heels. ?Have rubber bottoms. ?Are comfortable and fit you well. ?Are closed at the toe. Do not wear sandals. ?If you use a stepladder: ?Make sure that it is fully opened. Do not climb a closed stepladder. ?Make sure that both sides of the stepladder are locked into place. ?Ask someone to hold it for you, if possible. ?Clearly mark and make sure that you can see: ?Any grab bars or handrails. ?First and last steps. ?Where the edge of each step is. ?Use tools that help you move around (mobility aids) if they are needed. These  include: ?Canes. ?Walkers. ?Scooters. ?Crutches. ?Turn on the lights when you go into a dark area. Replace any light bulbs as soon as they burn out. ?Set up your furniture so you have a clear path. Avoid moving your furniture around. ?

## 2022-03-02 NOTE — Progress Notes (Signed)
? ?Subjective:  ? Sarah [name] is a 71 y.o. female who presents for Medicare Annual (Subsequent) preventive examination. ? ?I connected with  Sarah [name] on 03/02/22 by a telephone enabled telemedicine application and verified that I am speaking with the correct person using two identifiers. ?  ?I discussed the limitations of evaluation and management by telemedicine. The patient expressed understanding and agreed to proceed. ? ?Patient location: home ? ?Provider location: tele-health not in office ?  ? ?Review of Systems   ?Cardiac Risk Factors include: advanced age (>42mn, >>32women);hypertension;obesity (BMI >30kg/m2);smoking/ tobacco exposure ? ?   ?Objective:  ?  ?Today's Vitals  ? ?There is no height or weight on file to calculate BMI. ? ? ?  03/02/2022  ? 11:12 AM 01/15/2022  ? 11:09 AM 02/28/2021  ? 11:25 AM 05/25/2019  ?  2:16 PM 05/24/2019  ? 11:16 AM 11/10/2018  ? 10:50 AM 05/19/2018  ? 11:14 AM  ?Advanced Directives  ?Does Patient Have a Medical Advance Directive? No No No No No No No  ?Would patient like information on creating a medical advance directive? No - Patient declined No - Patient declined  No - Patient declined  No - Patient declined Yes (MAU/Ambulatory/Procedural Areas - Information given)  ? ? ?Current Medications (verified) ?Outpatient Encounter Medications as of 03/02/2022  ?Medication Sig  ? acetaminophen (TYLENOL) 325 MG tablet Take 650 mg by mouth every 6 (six) hours as needed for mild pain, moderate pain or headache.   ? amLODipine (NORVASC) 5 MG tablet Take 1 tablet (5 mg total) by mouth daily.  ? atenolol (TENORMIN) 100 MG tablet Take 1 tablet (100 mg total) by mouth daily.  ? atorvastatin (LIPITOR) 40 MG tablet Take 1 tablet (40 mg total) by mouth daily.  ? Cholecalciferol (VITAMIN D-3 PO) Take 600 Units by mouth daily.  ? citalopram (CELEXA) 20 MG tablet Take 1 tablet (20 mg total) by mouth daily.  ? ferrous sulfate 325 (65 FE) MG tablet Take 325 mg by mouth 3 (three) times a  week.  ? furosemide (LASIX) 40 MG tablet Take 1 tablet (40 mg total) by mouth daily.  ? Loperamide HCl (IMODIUM PO) Take by mouth. PRN  ? Multiple Vitamin (MULTIVITAMIN) tablet Take 1 tablet by mouth daily.  ? solifenacin (VESICARE) 5 MG tablet Take 5 mg by mouth daily.  ? traMADol (ULTRAM) 50 MG tablet Take 1 tablet (50 mg total) by mouth every 12 (twelve) hours as needed.  ? ?No facility-administered encounter medications on file as of 03/02/2022.  ? ? ?Allergies (verified) ?Codeine  ? ?History: ?Past Medical History:  ?Diagnosis Date  ? Abnormal mammogram, unspecified   ? Anemia   ? [facility]  [facility] ? Anxiety   ? Arthritis   ? Benign neoplasm of breast 2012  ? Breast complaint 2010  ? Chronic kidney disease   ? Renal Cell Cancer  ? Hypertension   ? Obesity, unspecified   ? Personal history of tobacco use, presenting hazards to health   ? [place] [facility], [place] [place]  ? Renal Cell with Left nephrectomy.  ? Special screening for malignant neoplasms, colon   ? ?Past Surgical History:  ?Procedure Laterality Date  ? ABDOMINAL HYSTERECTOMY  1993  ? BREAST BIOPSY Right 2012  ? stereo bx showing a 659mradial scar in an estimated 8 volume of tissue  ? BREAST BIOPSY Right Apr 09, 2005  ? Fibrocystic changes,  ductal adenosis, microcalcifications, focal stromal/epithelial proliferation, possible fibroadenoma type proliferation  ? CEREBRAL ANEURYSM REPAIR  2004  ? COLONOSCOPY  2008  ? Dr. Nicolasa Ducking  ? EYE SURGERY    ? FOOT SURGERY Right 2003  ? LAPAROSCOPIC NEPHRECTOMY, HAND ASSISTED Left 01/01/2017  ? Procedure: HAND ASSISTED LAPAROSCOPIC NEPHRECTOMY;  Surgeon: Nickie Retort, MD;  Location: [facility] ORS;  Service: Urology;  Laterality: Left;  ? TONSILLECTOMY  1997  ? ?Family History  ?Problem Relation Age of Onset  ? Cancer Cousin   ?     maternal first cousin with breast cancer  ? Cancer Other   ?     colon and ovarian cancers, no relationship listed  ? Cancer Father   ?     prostate  ?  Hypertension Father   ? Hypertension Sister   ? Hypertension Brother   ? Hypertension Paternal Grandmother   ? Heart disease Paternal Grandmother   ? Hypertension Paternal Grandfather   ? Heart disease Paternal Grandfather   ? Breast cancer Neg Hx   ? ?Social History  ? ?Socioeconomic History  ? Marital status: Divorced  ?  Spouse name: Not on file  ? Number of children: Not on file  ? Years of education: Not on file  ? Highest education level: GED or equivalent  ?Occupational History  ?  Comment: full time   ?Tobacco Use  ? Smoking status: Every Day  ?  Packs/day: 0.25  ?  Years: 40.00  ?  Pack years: 10.00  ?  Types: Cigarettes  ? Smokeless tobacco: Never  ?Vaping Use  ? Vaping Use: Never used  ?Substance and Sexual Activity  ? Alcohol use: Yes  ?  Comment: socially  ? Drug use: No  ? Sexual activity: Yes  ?Other Topics Concern  ? Not on file  ?Social History Narrative  ? Not on file  ? ?Social Determinants of Health  ? ?Financial Resource Strain: Low Risk   ? Difficulty of Paying Living Expenses: Not hard at all  ?Food Insecurity: No Food Insecurity  ? Worried About Charity fundraiser in the Last Year: Never true  ? Ran Out of Food in the Last Year: Never true  ?Transportation Needs: No Transportation Needs  ? Lack of Transportation (Medical): No  ? Lack of Transportation (Non-Medical): No  ?Physical Activity: Sufficiently Active  ? Days of Exercise per Week: 4 days  ? Minutes of Exercise per Session: 40 min  ?Stress: No Stress Concern Present  ? Feeling of Stress : Not at all  ?Social Connections: Moderately Integrated  ? Frequency of Communication with Friends and Family: More than three times a week  ? Frequency of Social Gatherings with Friends and Family: Twice a week  ? Attends Religious Services: More than 4 times per year  ? Active Member of Clubs or Organizations: Yes  ? Attends Archivist Meetings: More than 4 times per year  ? Marital Status: Divorced  ? ? ?Tobacco Counseling ?Ready to quit:  Not Answered ?Counseling given: Not Answered ? ? ?Clinical Intake: ? ?Pre-visit preparation completed: Yes ? ?Pain : No/denies pain ? ?  ? ?Nutritional Risks: None ?Diabetes: No ? ?How often do you need to have someone help you when you read instructions, pamphlets, or other written materials from your doctor or pharmacy?: 1 - Never ? ?Diabetic?  no ? ?Interpreter Needed?: No ? ?Information entered by :: Leroy Kennedy LPN ? ? ?Activities of Daily Living ? ?  03/02/2022  ?  11:03 AM  ?In your present state of health, do you have any difficulty performing the following activities:  ?Hearing? 0  ?Vision? 0  ?Difficulty concentrating or making decisions? 0  ?Walking or climbing stairs? 0  ?Dressing or bathing? 0  ?Doing errands, shopping? 0  ?Preparing Food and eating ? N  ?Using the Toilet? N  ?In the past six months, have you accidently leaked urine? N  ?Do you have problems with loss of bowel control? N  ?Managing your Medications? N  ?Managing your Finances? N  ?Housekeeping or managing your Housekeeping? N  ? ? ?Patient Care Team: ?Venita Lick, NP as PCP - General (Nurse Practitioner) ?Nickie Retort, MD (Inactive) as Consulting Physician (Urology) ?Lloyd Huger, MD as Consulting Physician (Oncology) ? ?Indicate any recent Medical Services you may have received from other than [facility] providers in the past year (date may be approximate). ? ?   ?Assessment:  ? This is a routine wellness examination for Sarah [name]. ? ?Hearing/Vision screen ?Hearing Screening - Comments:: No trouble hearing ?Vision Screening - Comments:: Not up to date ?[place] ? ?Dietary issues and exercise activities discussed: ?Current Exercise Habits: Home exercise routine, Type of exercise: walking, Time (Minutes): 40, Frequency (Times/Week): 4, Weekly Exercise (Minutes/Week): 160, Intensity: Mild ? ? Goals Addressed   ? ?  ?  ?  ?  ? This Visit's Progress  ?  Quit Smoking     ? ?  ? ?Depression Screen ? ?  03/02/2022  ? 11:02 AM 01/08/2022  ?  10:59 AM 07/09/2021  ? 10:37 AM 02/28/2021  ? 11:26 AM 01/08/2021  ? 11:02 AM 07/08/2020  ?  3:37 PM 12/29/2019  ? 10:41 AM  ?PHQ 2/9 Scores  ?PHQ - 2 Score 0 0 0 0 0 0 0  ?PHQ- 9 Score '2 1 1  '$ 0 1 0  ?  ?Fall Risk ?

## 2022-03-23 ENCOUNTER — Other Ambulatory Visit (INDEPENDENT_AMBULATORY_CARE_PROVIDER_SITE_OTHER): Payer: Medicare HMO

## 2022-03-23 DIAGNOSIS — I1310 Hypertensive heart and chronic kidney disease without heart failure, with stage 1 through stage 4 chronic kidney disease, or unspecified chronic kidney disease: Secondary | ICD-10-CM | POA: Diagnosis not present

## 2022-03-23 DIAGNOSIS — N1832 Chronic kidney disease, stage 3b: Secondary | ICD-10-CM

## 2022-03-24 LAB — BASIC METABOLIC PANEL
BUN/Creatinine Ratio: 17 (ref 12–28)
BUN: 37 mg/dL — ABNORMAL HIGH (ref 8–27)
CO2: 19 mmol/L — ABNORMAL LOW (ref 20–29)
Calcium: 9.4 mg/dL (ref 8.7–10.3)
Chloride: 108 mmol/L — ABNORMAL HIGH (ref 96–106)
Creatinine, Ser: 2.16 mg/dL — ABNORMAL HIGH (ref 0.57–1.00)
Glucose: 90 mg/dL (ref 70–99)
Potassium: 4 mmol/L (ref 3.5–5.2)
Sodium: 146 mmol/L — ABNORMAL HIGH (ref 134–144)
eGFR: 24 mL/min/{1.73_m2} — ABNORMAL LOW (ref >59)

## 2022-05-24 ENCOUNTER — Other Ambulatory Visit: Payer: Self-pay | Admitting: Nurse Practitioner

## 2022-06-09 ENCOUNTER — Other Ambulatory Visit: Payer: Self-pay

## 2022-06-09 DIAGNOSIS — N281 Cyst of kidney, acquired: Secondary | ICD-10-CM

## 2022-06-16 ENCOUNTER — Ambulatory Visit: Payer: Self-pay | Admitting: Urology

## 2022-06-23 ENCOUNTER — Ambulatory Visit
Admission: RE | Admit: 2022-06-23 | Discharge: 2022-06-23 | Disposition: A | Discharge status: Home / Self Care | Payer: Medicare HMO | Source: Ambulatory Visit | Attending: Urology | Admitting: Urology

## 2022-06-23 DIAGNOSIS — N281 Cyst of kidney, acquired: Secondary | ICD-10-CM | POA: Diagnosis not present

## 2022-06-30 ENCOUNTER — Encounter: Payer: Self-pay | Admitting: Urology

## 2022-06-30 ENCOUNTER — Ambulatory Visit: Payer: Medicare HMO | Admitting: Urology

## 2022-06-30 VITALS — BP 138/74 | HR 76 | Ht 67.0 in | Wt 195.0 lb

## 2022-06-30 DIAGNOSIS — R350 Frequency of micturition: Secondary | ICD-10-CM

## 2022-06-30 DIAGNOSIS — Z85528 Personal history of other malignant neoplasm of kidney: Secondary | ICD-10-CM | POA: Diagnosis not present

## 2022-06-30 DIAGNOSIS — N281 Cyst of kidney, acquired: Secondary | ICD-10-CM | POA: Diagnosis not present

## 2022-06-30 NOTE — Progress Notes (Signed)
06/30/22 4:22 PM   [name] 01-Apr-1951 [id number]  Referring provider:  Venita Lick, NP [address] [place],  [place] [address] Chief Complaint  Patient presents with   Other    HPI: Sarah [name] is a 71 y.o.female with multiple medical issues including history of multifocal RCC status post left nephrectomy, multifocal complex right renal cyst, and OAB who returns to the office for routine annual follow-up.   She underwent left laparoscopic hand-assisted nephrectomy on 01/01/2017 by Dr. Baruch Gouty.   Surgical pathology c/w multifocal clear cell renal cell carcinoma. 3 distinct tumors were seen. 2 were cystic and the third was solid. There was tumor extension into the renal ssinus. Margins were negative. Lymphovascular invasion was not identified. Histologic grade 2. Greatest dimension was 2.1 cm. Stage pT3a.   She saw medical oncology in 12/2020 CT scan was stable. She had repeat CT on 01/09/2022 that showed no evidence of recurrence or abdominopelvic metastatic disease on this noncontrast examination.Numerous right-sided hypo and hyperdense renal lesions, the largest of which is in the upper pole measuring 4.9 cm previously 4.8 cm.   We have been following her multifocal right renal cysts with serial imaging.  Renal ultrasound this year is stable from recent comparison studies. It had mildly increased in size in comparison to June 2019 [facility]   Personal history of stage IIIb CKD,slightly worsened renal function up to 2.16 on 03/23/2022 with previous of 2.12 on 01/09/2022.   She reports on how much water intake she has if her vesicare works.   PMH: Past Medical History:  Diagnosis Date   Abnormal mammogram, unspecified    Anemia    Aneurysm ([place]) 2004   cerebral x  4, [facility]   Anxiety    Arthritis    Benign neoplasm of breast 2012   Breast complaint 2010   Chronic kidney disease    Renal Cell Cancer   Hypertension    Obesity, unspecified    Personal  history of tobacco use, presenting hazards to health    Renal cancer, left ([place]) 11/2016   Renal Cell with Left nephrectomy.   Special screening for malignant neoplasms, colon     Surgical History: Past Surgical History:  Procedure Laterality Date   ABDOMINAL HYSTERECTOMY  1993   BREAST BIOPSY Right 2012   stereo bx showing a 61m radial scar in an estimated 8 volume of tissue   BREAST BIOPSY Right Apr 09, 2005   Fibrocystic changes, ductal adenosis, microcalcifications, focal stromal/epithelial proliferation, possible fibroadenoma type proliferation   CEREBRAL ANEURYSM REPAIR  2004   COLONOSCOPY  2008   Dr. KNicolasa Ducking  EYE SURGERY     FOOT SURGERY Right 2003   LAPAROSCOPIC NEPHRECTOMY, HAND ASSISTED Left 01/01/2017   Procedure: HAND ASSISTED LAPAROSCOPIC NEPHRECTOMY;  Surgeon: BNickie Retort MD;  Location: [facility] ORS;  Service: Urology;  Laterality: Left;   TONSILLECTOMY  1997    Home Medications:  Allergies as of 06/30/2022       Reactions   Codeine Itching        Medication List        Accurate as of June 30, 2022  4:22 PM. If you have any questions, ask your nurse or doctor.          acetaminophen 325 MG tablet Commonly known as: TYLENOL Take 650 mg by mouth every 6 (six) hours as needed for mild pain, moderate pain or headache.   amLODipine 5 MG tablet  Commonly known as: NORVASC Take 1 tablet (5 mg total) by mouth daily.   atenolol 100 MG tablet Commonly known as: TENORMIN Take 1 tablet (100 mg total) by mouth daily.   atorvastatin 40 MG tablet Commonly known as: LIPITOR Take 1 tablet (40 mg total) by mouth daily.   citalopram 20 MG tablet Commonly known as: CELEXA Take 1 tablet (20 mg total) by mouth daily.   ferrous sulfate 325 (65 FE) MG tablet Take 325 mg by mouth 3 (three) times a week.   furosemide 40 MG tablet Commonly known as: LASIX Take 1 tablet (40 mg total) by mouth daily.   IMODIUM PO Take by mouth. PRN   multivitamin tablet Take  1 tablet by mouth daily.   solifenacin 5 MG tablet Commonly known as: VESICARE Take 5 mg by mouth daily.   traMADol 50 MG tablet Commonly known as: ULTRAM TAKE 1 TABLET BY MOUTH EVERY 12 HOURS AS NEEDED.   VITAMIN D-3 PO Take 600 Units by mouth daily.        Allergies:  Allergies  Allergen Reactions   Codeine Itching    Family History: Family History  Problem Relation Age of Onset   Cancer Cousin        maternal first cousin with breast cancer   Cancer Other        colon and ovarian cancers, no relationship listed   Cancer Father        prostate   Hypertension Father    Hypertension Sister    Hypertension Brother    Hypertension Paternal Grandmother    Heart disease Paternal Grandmother    Hypertension Paternal Grandfather    Heart disease Paternal Grandfather    Breast cancer Neg Hx     Social History:  reports that she has been smoking cigarettes. She has a 10.00 pack-year smoking history. She has never used smokeless tobacco. She reports current alcohol use. She reports that she does not use drugs.   Physical Exam: BP 138/74   Pulse 76   Ht '5\' 7"'$  (1.702 m)   Wt 195 lb (88.5 kg)   BMI 30.54 kg/m   Constitutional:  Alert and oriented, No acute distress. HEENT: [place] AT, moist mucus membranes.  Trachea midline, no masses. Cardiovascular: No clubbing, cyanosis, or edema. Respiratory: Normal respiratory effort, no increased work of breathing. Skin: No rashes, bruises or suspicious lesions. Neurologic: Grossly intact, no focal deficits, moving all 4 extremities. Psychiatric: Normal mood and affect.  Laboratory Data: Lab Results  Component Value Date   CREATININE 2.16 (H) 03/23/2022   No results found for: "HGBA1C"   Pertinent Imaging: CLINICAL DATA:  Renal cysts   EXAM: RENAL / URINARY TRACT ULTRASOUND COMPLETE   COMPARISON:  January 09, 2022   FINDINGS: Right Kidney:   Renal measurements: 14.7 x 5.9 x 6.2 cm = volume: 283.3 mL. Numerous,  more than 10, benign-appearing cysts are present throughout the right kidney very from few mm to 5.8 cm. The largest cyst in the midpole region of the right kidney measures 5.8 x 4.8 x 5.1 cm. Additionally, there is a hypoechoic circumscribed exophytic mass superior to the right kidney which measures 2.9 x 2.9 x 3.3 cm. This mass is thought to be likely adrenal in origin.   Left Kidney:   Surgically absent.   Bladder:   Appears normal for degree of bladder distention. Right-sided ureteral jet is seen.   Other:   None.   IMPRESSION: 3.3 cm indeterminate right adrenal mass,  stable from the more recent comparison studies but mildly increased in size when compared to June 2019 renal ultrasound when it measured 2.7 x 2.3 x 2.7 cm. This lesion was characterized as probable adrenal adenoma on abdominal MRI dated October 15, 2016. Clinical follow-up is recommended.   Numerous benign-appearing right renal cysts.   Post left nephrectomy.     Electronically Signed   By: Fidela Salisbury M.D.   On: 06/23/2022 16:41  I have personally reviewed the images and agree with radiologist interpretation.   Assessment & Plan:    1. Acquired renal cyst of right kidney - Multifocal right renal cysts, primarily Bosniak Bosniak I and II appear stable which is reassuring - We will continue to follow with annual serial renal ultrasounds alternating CTs with medical oncology.  High risk for development of contralateral disease. - US RENAL; Future  2. History of renal cell cancer - As above - Has upcoming chest CT scheduled for lung cancer screening    3. Urinary frequency  -Symptoms vary based on water intake we discussed behavioral modification, edema, will keep her on medication and continue to monitor symptoms  -history of sleep apnea, may benefit from CPAP -continue vesicare  F/u 1 year with [facility]  Conley Rolls as a scribe for Hollice Espy, MD.,have documented all  relevant documentation on the behalf of Hollice Espy, MD,as directed by  Hollice Espy, MD while in the presence of Hollice Espy, MD.  I have reviewed the above documentation for accuracy and completeness, and I agree with the above.   Hollice Espy, MD    [facility] [address], [place], [place] [address] [phone]

## 2022-07-05 DIAGNOSIS — I071 Rheumatic tricuspid insufficiency: Secondary | ICD-10-CM | POA: Insufficient documentation

## 2022-07-05 DIAGNOSIS — I340 Nonrheumatic mitral (valve) insufficiency: Secondary | ICD-10-CM | POA: Insufficient documentation

## 2022-07-05 NOTE — Patient Instructions (Signed)
Chronic Kidney Disease, Adult Chronic kidney disease is when lasting damage happens to the kidneys slowly over a long time. The kidneys help to: Make pee (urine). Make hormones. Keep the right amount of fluids and chemicals in the body. Most often, this disease does not go away. You must take steps to help keep the kidney damage from getting worse. If steps are not taken, the kidneys might stop working forever. What are the causes? Diabetes. High blood pressure. Diseases that affect the heart and blood vessels. Other kidney diseases. Diseases of the body's disease-fighting system. A problem with the flow of pee. Infections of the organs that make pee, store it, and take it out of the body. Swelling or irritation of your blood vessels. What increases the risk? Getting older. Having someone in your family who has kidney disease or kidney failure. Having a disease caused by genes. Taking medicines often that harm the kidneys. Being near or having contact with harmful substances. Being very overweight. Using tobacco now or in the past. What are the signs or symptoms? Feeling very tired. Having a swollen face, legs, ankles, or feet. Feeling like you may vomit or vomiting. Not feeling hungry. Being confused or not able to focus. Twitches and cramps in the leg muscles or other muscles. Dry, itchy skin. A taste of metal in your mouth. Making less pee, or making more pee. Shortness of breath. Trouble sleeping. You may also become anemic or get weak bones. Anemic means there is not enough red blood cells or hemoglobin in your blood. You may get symptoms slowly. You may not notice them until the kidney damage gets very bad. How is this treated? Often, there is no cure for this disease. Treatment can help with symptoms and help keep the disease from getting worse. You may need to: Avoid alcohol. Avoid foods that are high in salt, potassium, phosphorous, and protein. Take medicines for  symptoms and to help control other conditions. Have dialysis. This treatment gets harmful waste out of your body. Treat other problems that cause your kidney disease or make it worse. Follow these instructions at home: Medicines Take over-the-counter and prescription medicines only as told by your doctor. Do not take any new medicines, vitamins, or supplements unless your doctor says it is okay. Lifestyle  Do not smoke or use any products that contain nicotine or tobacco. If you need help quitting, ask your doctor. If you drink alcohol: Limit how much you use to: 0-1 drink a day for women who are not pregnant. 0-2 drinks a day for men. Know how much alcohol is in your drink. In the [place] one drink equals one 12 oz bottle of beer (355 mL), one 5 oz glass of wine (148 mL), or one 1 oz glass of hard liquor (44 mL). Stay at a healthy weight. If you need help losing weight, ask your doctor. General instructions  Follow instructions from your doctor about what you cannot eat or drink. Track your blood pressure at home. Tell your doctor about any changes. If you have diabetes, track your blood sugar. Exercise at least 30 minutes a day, 5 days a week. Keep your shots (vaccinations) up to date. Keep all follow-up visits. Where to find more information American Association of Kidney Patients: www.aakp.org National Kidney Foundation: www.kidney.org American Kidney Fund: www.akfinc.org Life Options: www.lifeoptions.org Kidney School: www.kidneyschool.org Contact a doctor if: Your symptoms get worse. You get new symptoms. Get help right away if: You get symptoms of end-stage kidney disease. These   include: Headaches. Losing feeling in your hands or feet. Easy bruising. Having hiccups often. Chest pain. Shortness of breath. Lack of menstrual periods, in women. You have a fever. You make less pee than normal. You have pain or you bleed when you pee or poop. These symptoms may be an  emergency. Get help right away. Call your local emergency services (911 in the [place] Do not wait to see if the symptoms will go away. Do not drive yourself to the hospital. Summary Chronic kidney disease is when lasting damage happens to the kidneys slowly over a long time. Causes of this disease include diabetes and high blood pressure. Often, there is no cure for this disease. Treatment can help symptoms and help keep the disease from getting worse. Treatment may involve lifestyle changes, medicines, and dialysis. This information is not intended to replace advice given to you by your health care provider. Make sure you discuss any questions you have with your health care provider. Document Revised: 02/21/2020 Document Reviewed: 02/21/2020 Elsevier Patient Education  2023 [employer]  

## 2022-07-06 DIAGNOSIS — D631 Anemia in chronic kidney disease: Secondary | ICD-10-CM | POA: Diagnosis not present

## 2022-07-06 DIAGNOSIS — I10 Essential (primary) hypertension: Secondary | ICD-10-CM | POA: Diagnosis not present

## 2022-07-06 DIAGNOSIS — N184 Chronic kidney disease, stage 4 (severe): Secondary | ICD-10-CM | POA: Diagnosis not present

## 2022-07-06 DIAGNOSIS — N281 Cyst of kidney, acquired: Secondary | ICD-10-CM | POA: Diagnosis not present

## 2022-07-06 DIAGNOSIS — Z905 Acquired absence of kidney: Secondary | ICD-10-CM | POA: Diagnosis not present

## 2022-07-06 DIAGNOSIS — I129 Hypertensive chronic kidney disease with stage 1 through stage 4 chronic kidney disease, or unspecified chronic kidney disease: Secondary | ICD-10-CM | POA: Diagnosis not present

## 2022-07-08 ENCOUNTER — Encounter: Payer: Self-pay | Admitting: Nurse Practitioner

## 2022-07-08 ENCOUNTER — Ambulatory Visit (INDEPENDENT_AMBULATORY_CARE_PROVIDER_SITE_OTHER): Payer: Medicare HMO | Admitting: Nurse Practitioner

## 2022-07-08 VITALS — BP 128/70 | HR 58 | Temp 97.8°F | Ht 67.0 in | Wt 192.3 lb

## 2022-07-08 DIAGNOSIS — N184 Chronic kidney disease, stage 4 (severe): Secondary | ICD-10-CM

## 2022-07-08 DIAGNOSIS — I361 Nonrheumatic tricuspid (valve) insufficiency: Secondary | ICD-10-CM | POA: Diagnosis not present

## 2022-07-08 DIAGNOSIS — F324 Major depressive disorder, single episode, in partial remission: Secondary | ICD-10-CM

## 2022-07-08 DIAGNOSIS — J432 Centrilobular emphysema: Secondary | ICD-10-CM

## 2022-07-08 DIAGNOSIS — I700 Atherosclerosis of aorta: Secondary | ICD-10-CM | POA: Diagnosis not present

## 2022-07-08 DIAGNOSIS — D631 Anemia in chronic kidney disease: Secondary | ICD-10-CM

## 2022-07-08 DIAGNOSIS — E7800 Pure hypercholesterolemia, unspecified: Secondary | ICD-10-CM | POA: Diagnosis not present

## 2022-07-08 DIAGNOSIS — E6609 Other obesity due to excess calories: Secondary | ICD-10-CM | POA: Diagnosis not present

## 2022-07-08 DIAGNOSIS — R69 Illness, unspecified: Secondary | ICD-10-CM | POA: Diagnosis not present

## 2022-07-08 DIAGNOSIS — M25561 Pain in right knee: Secondary | ICD-10-CM | POA: Diagnosis not present

## 2022-07-08 DIAGNOSIS — I340 Nonrheumatic mitral (valve) insufficiency: Secondary | ICD-10-CM

## 2022-07-08 DIAGNOSIS — I1310 Hypertensive heart and chronic kidney disease without heart failure, with stage 1 through stage 4 chronic kidney disease, or unspecified chronic kidney disease: Secondary | ICD-10-CM | POA: Diagnosis not present

## 2022-07-08 DIAGNOSIS — C642 Malignant neoplasm of left kidney, except renal pelvis: Secondary | ICD-10-CM

## 2022-07-08 DIAGNOSIS — F17210 Nicotine dependence, cigarettes, uncomplicated: Secondary | ICD-10-CM | POA: Diagnosis not present

## 2022-07-08 DIAGNOSIS — Z6831 Body mass index (BMI) 31.0-31.9, adult: Secondary | ICD-10-CM

## 2022-07-08 DIAGNOSIS — G8929 Other chronic pain: Secondary | ICD-10-CM

## 2022-07-08 NOTE — Progress Notes (Signed)
BP 128/70   Pulse (!) 58   Temp 97.8 F (36.6 C) (Oral)   Ht 5' 7" (1.702 m)   Wt 192 lb 4.8 oz (87.2 kg)   SpO2 96%   BMI 30.12 kg/m    Subjective:    Patient ID: Sarah [name], female    DOB: July 27, 1951, 71 y.o.   MRN: [id number]  HPI: Sarah [name] is a 71 y.o. female  Chief Complaint  Patient presents with   Hyperlipidemia   Mood   Hypertension   Chronic Kidney Disease   Cancer   Knee Pain    Patient says she would like to discuss knee pain with the provider at today's visit. Patient says the pain has been an ongoing issue and she was prescribed Tramadol and says it helps, but after a while her knee starts to hurts again. Patient says it is worse when she goes to shower.   RENAL CANCER STAGE III: Followed oncology and urology.  History of left nephrectomy on January 01, 2017.  Required abdominal/pelvic CT scan approximately every 6 months for 3 years after her nephrectomy, last was on 01/09/22 remained stable.  Urology last 06/30/22 and oncology 01/15/22 -- she is discharged from oncology at this time.  CHRONIC KIDNEY DISEASE Is followed by Dr. Candiss Norse, has visit on 07/13/22. Labs 07/06/22 CRT 2.42 and GFR 21, BUN 44, PTH 96, H/H 10.8/33.1. CKD status: stable Medications renally dose: yes Previous renal evaluation: yes Pneumovax:  Up to Date Influenza Vaccine:  Up To Date  HYPERTENSION / HYPERLIPIDEMIA Continues on Amlodipine, Atenolol, and Atorvastatin.  Was on Benazepril/HCTZ but this was stopped due to low BP.  Sees Dr. Rockey Situ next on 08/10/22. Satisfied with current treatment? yes Duration of hypertension: chronic BP monitoring frequency: not checking BP range: BP medication side effects: no Duration of hyperlipidemia: chronic Cholesterol medication side effects: no Cholesterol supplements: none Medication compliance: good compliance Aspirin: no Recent stressors: no Recurrent headaches: no Visual changes: no Palpitations: no Dyspnea: no Chest pain:  no Lower extremity edema: no Dizzy/lightheaded: no    COPD Is a current every day smoker, is reducing this to 4-5 cigarettes.  Has smoked since her teenage years. Lung CA screening 08/27/21 noting centrilobular emphysema and aortic atherosclerosis + a stable 3 cm adrenal nodule. No current inhalers. Recent CT lung also noted possible pulmonary arterial hypertension -- she denies palpitations, fatigue, edema. COPD status: stable Satisfied with current treatment?: yes Oxygen use: no Dyspnea frequency: none Cough frequency: occasional, but not often Rescue inhaler frequency: none Limitation of activity: no Productive cough: none Last Spirometry: FEV1 96% and FEV1/FVC 112% Pneumovax: Up to Date Influenza: Up to Date  KNEE PAIN Has had ongoing right knee pain for several years with Tramadol prescribed by previous PCP to use as needed.  Minimally uses, 14 pills lasts 5-6 months.  Last filled 05/25/22.  Last imaging in 2016.  She feels this knee pain is worsening.  Worse in morning with stiffness and pain, lasts about 30 minutes and then improves. Duration: chronic Involved knee: right Mechanism of injury: unknown Location:anterior Onset: gradual Severity: 10/10  Quality:  sharp, dull, aching, and throbbing Frequency: intermittent Radiation: no Aggravating factors: weight bearing, walking, stairs, bending, and movement  Alleviating factors:  Tramadol, ice, APAP, and rest  Status: worse Treatments attempted: rest, ice, and APAP  Relief with NSAIDs?:  moderate Weakness with weight bearing or walking: no Sensation of giving way: no Locking: no Popping: yes Bruising: no Swelling: yes Redness:  no Paresthesias/decreased sensation: no Fevers: no    DEPRESSION Continues on Celexa 20 MG. Mood status: controlled Satisfied with current treatment?: yes Symptom severity: mild  Duration of current treatment : chronic Side effects: no Medication compliance: excellent  compliance Psychotherapy/counseling: none Anxious mood: no Anhedonia: no Significant weight loss or gain: no Insomnia: none Fatigue: no Feelings of worthlessness or guilt: no Impaired concentration/indecisiveness: no Suicidal ideations: no Hopelessness: no Crying spells: no    07/08/2022   11:16 AM 03/02/2022   11:02 AM 01/08/2022   10:59 AM 07/09/2021   10:37 AM 02/28/2021   11:26 AM  Depression screen PHQ 2/9  Decreased Interest 0 0 0 0 0  Down, Depressed, Hopeless 0 0 0 0 0  PHQ - 2 Score 0 0 0 0 0  Altered sleeping 0 0 0 0   Tired, decreased energy 0 _0 Change in appetite 0 0 0 0   Feeling bad or failure about yourself  0 0 0 0   Trouble concentrating 0 0 0 0   Moving slowly or fidgety/restless 0 0 0 0   Suicidal thoughts 0 0 0 0   PHQ-9 Score 0 _1 Difficult doing work/chores Not difficult at all   Not difficult at all        07/08/2022   11:16 AM 01/08/2022   10:59 AM 12/28/2018   10:49 AM  GAD 7 : Generalized Anxiety Score  Nervous, Anxious, on Edge 0 0 0  Control/stop worrying 0 0 0  Worry too much - different things 0 0 0  Trouble relaxing 0 0 0  Restless 0 0 0  Easily annoyed or irritable 0 0 0  Afraid - awful might happen 0 0 0  Total GAD 7 Score 0 0 0  Anxiety Difficulty  Not difficult at all Not difficult at all   Relevant past medical, surgical, family and social history reviewed and updated as indicated. Interim medical history since our last visit reviewed. Allergies and medications reviewed and updated.  Review of Systems  Constitutional:  Negative for activity change, appetite change, diaphoresis, fatigue and fever.  Respiratory:  Negative for cough, chest tightness and shortness of breath.   Cardiovascular:  Negative for chest pain, palpitations and leg swelling.  Gastrointestinal: Negative.   Musculoskeletal:  Positive for arthralgias.  Neurological:  Negative for dizziness, syncope, weakness, light-headedness, numbness and headaches.   Psychiatric/Behavioral: Negative.      Per HPI unless specifically indicated above     Objective:    BP 128/70   Pulse (!) 58   Temp 97.8 F (36.6 C) (Oral)   Ht 5' 7" (1.702 m)   Wt 192 lb 4.8 oz (87.2 kg)   SpO2 96%   BMI 30.12 kg/m   Wt Readings from Last 3 Encounters:  07/08/22 192 lb 4.8 oz (87.2 kg)  06/30/22 195 lb (88.5 kg)  02/06/22 195 lb 8 oz (88.7 kg)    Physical Exam Vitals and nursing note reviewed.  Constitutional:      General: She is awake. She is not in acute distress.    Appearance: She is well-developed. She is obese. She is not ill-appearing.  HENT:     Head: Normocephalic.     Right Ear: Hearing normal.     Left Ear: Hearing normal.     Nose: Nose normal.     Mouth/Throat:     Mouth: Mucous membranes are moist.  Eyes:  General: Lids are normal.        Right eye: No discharge.        Left eye: No discharge.     Conjunctiva/sclera: Conjunctivae normal.     Pupils: Pupils are equal, round, and reactive to light.  Neck:     Vascular: No carotid bruit.  Cardiovascular:     Rate and Rhythm: Normal rate and regular rhythm.     Heart sounds: Murmur heard.     Systolic murmur is present with a grade of 2/6.     No gallop.  Pulmonary:     Effort: Pulmonary effort is normal.     Breath sounds: Normal breath sounds.  Abdominal:     General: Bowel sounds are normal.     Palpations: Abdomen is soft.  Musculoskeletal:     Cervical back: Normal range of motion and neck supple.     Right knee: Swelling and crepitus present. No erythema or bony tenderness. Normal range of motion. Tenderness present over the medial joint line. Normal meniscus. Normal pulse.     Instability Tests: Anterior drawer test negative. Posterior drawer test negative. Medial McMurray test negative and lateral McMurray test negative.     Left knee: Normal.     Right lower leg: No edema.     Left lower leg: No edema.  Skin:    General: Skin is warm and dry.  Neurological:      Mental Status: She is alert and oriented to person, place, and time.  Psychiatric:        Attention and Perception: Attention normal.        Mood and Affect: Mood normal.        Behavior: Behavior normal. Behavior is cooperative.        Thought Content: Thought content normal.        Judgment: Judgment normal.    Results for orders placed or performed in visit on 29/51/88  Basic Metabolic Panel (BMET)  Result Value Ref Range   Glucose 90 70 - 99 mg/dL   BUN 37 (H) 8 - 27 mg/dL   Creatinine, Ser 2.16 (H) 0.57 - 1.00 mg/dL   eGFR 24 (L) >59 mL/min/1.73   BUN/Creatinine Ratio 17 12 - 28   Sodium 146 (H) 134 - 144 mmol/L   Potassium 4.0 3.5 - 5.2 mmol/L   Chloride 108 (H) 96 - 106 mmol/L   CO2 19 (L) 20 - 29 mmol/L   Calcium 9.4 8.7 - 10.3 mg/dL      Assessment & Plan:   Problem List Items Addressed This Visit       Cardiovascular and Mediastinum   Aortic atherosclerosis ([place])    Noted on lung CT screenings.  Continue statin daily and recommend modest weight loss + focus on healthy diet.  Complete cessation of smoking recommended.      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 Followed by nephrology. BP at goal today, recommend she continue to check at home and monitor diet, DASH diet. Continue current medication regimen and adjust as needed + collaboration with nephrology.  Recent labs with nephrology obtained and reviewed today.  Return in 6 months.        Relevant Orders   TSH   Mitral valvular insufficiency    Noted on echo 02/03/22.  Asymptomatic.  Continue current medication  regimen and collaboration with cardiology.      Relevant Orders   Lipid Panel w/o Chol/HDL Ratio   Tricuspid valve insufficiency    Noted on echo 02/03/22.  Asymptomatic.  Continue current medication regimen and collaboration with cardiology.       Relevant Orders   Lipid Panel w/o Chol/HDL Ratio     Respiratory   Pulmonary emphysema (HCC)    Chronic, stable without inhalers at this time.  Recommend complete cessation of smoking.  Spirometry March 2023 noting FEV1 96% and FEV1/FVC 112% -- reviewed with patient at the time.  Continue yearly lung screening + collaboration with cardiology.        Genitourinary   Anemia in chronic kidney disease    Chronic, ongoing.  Followed by nephrology -- recent labs reviewed and note.      CKD (chronic kidney disease) stage 4, GFR 15-29 ml/min (HCC)    Chronic, ongoing.  Recent labs obtained with nephrology.  Continue current medication regimen and collaboration with nephrology.   Reviewed recent labs and note.       Renal cancer, left ([place])    Followed by Dr. Grayland Ormond in past, has been discharged at this time.  Continue this collaboration as needed and review of notes.  Continue to follow with nephrology.        Other   Chronic pain of right knee    Chronic, ongoing with very rare Tramadol use.  Recent refills sent, obtain UDS and controlled substance agreement next visit.  Pain is worsening, will place referral to ortho for further assessment -- history of OA on imaging years ago, may be advancing and would benefit their input on this.        Relevant Orders   Ambulatory referral to Orthopedics   Depression    Chronic, ongoing.  Continue current medication regimen and adjust as needed.  Refills sent in as needed.  Denies SI/HI.        Hyperlipidemia    Chronic, ongoing.  Continue current medication regimen and adjust as needed.  Lipid panel and TSH today.      Relevant Orders   Lipid Panel w/o Chol/HDL Ratio   Nicotine dependence, cigarettes, uncomplicated    I have recommended complete cessation of tobacco use. I have discussed various options available for assistance with tobacco cessation including over the counter methods (Nicotine gum, patch and lozenges). We also discussed  prescription options (Chantix, Nicotine Inhaler / Nasal Spray). The patient is not interested in pursuing any prescription tobacco cessation options at this time.  Continue annual lung CA screening.       Obesity    BMI 30.12. Recommended eating smaller high protein, low fat meals more frequently and exercising 30 mins a day 5 times a week with a goal of 10-15lb weight loss in the next 3 months. Patient voiced their understanding and motivation to adhere to these recommendations.         Follow up plan: Return in about 6 months (around 01/08/2023) for HTN/HLD, COPD, KNEE PAIN, CKD, RENAL CA.

## 2022-07-08 NOTE — Assessment & Plan Note (Signed)
Chronic, ongoing with very rare Tramadol use.  Recent refills sent, obtain UDS and controlled substance agreement next visit.  Pain is worsening, will place referral to ortho for further assessment -- history of OA on imaging years ago, may be advancing and would benefit their input on this.

## 2022-07-08 NOTE — Assessment & Plan Note (Signed)
BMI 30.12.  Recommended eating smaller high protein, low fat meals more frequently and exercising 30 mins a day 5 times a week with a goal of 10-15lb weight loss in the next 3 months. Patient voiced their understanding and motivation to adhere to these recommendations.  

## 2022-07-08 NOTE — Assessment & Plan Note (Signed)
Chronic, ongoing.  Recent labs obtained with nephrology.  Continue current medication regimen and collaboration with nephrology.   Reviewed recent labs and note.  

## 2022-07-08 NOTE — Assessment & Plan Note (Signed)
Noted on lung CT screenings.  Continue statin daily and recommend modest weight loss + focus on healthy diet.  Complete cessation of smoking recommended.

## 2022-07-08 NOTE — Assessment & Plan Note (Signed)
Chronic, ongoing. Followed by nephrology. BP at goal today, recommend she continue to check at home and monitor diet, DASH diet. Continue current medication regimen and adjust as needed + collaboration with nephrology.  Recent labs with nephrology obtained and reviewed today.  Return in 6 months.

## 2022-07-08 NOTE — Assessment & Plan Note (Signed)
Chronic, ongoing.  Continue current medication regimen and adjust as needed.  Lipid panel and TSH today.

## 2022-07-08 NOTE — Assessment & Plan Note (Signed)
Chronic, ongoing.  Followed by nephrology -- recent labs reviewed and note. 

## 2022-07-08 NOTE — Assessment & Plan Note (Signed)
Followed by Dr. Finnegan in past, has been discharged at this time.  Continue this collaboration as needed and review of notes.  Continue to follow with nephrology. 

## 2022-07-08 NOTE — Assessment & Plan Note (Signed)
Chronic, stable without inhalers at this time.  Recommend complete cessation of smoking.  Spirometry March 2023 noting FEV1 96% and FEV1/FVC 112% -- reviewed with patient at the time.  Continue yearly lung screening + collaboration with cardiology. 

## 2022-07-08 NOTE — Assessment & Plan Note (Signed)
Noted on echo 02/03/22.  Asymptomatic.  Continue current medication regimen and collaboration with cardiology.

## 2022-07-08 NOTE — Assessment & Plan Note (Signed)
Chronic, ongoing.  Continue current medication regimen and adjust as needed.  Refills sent in as needed.  Denies SI/HI.   

## 2022-07-08 NOTE — Assessment & Plan Note (Signed)
I have recommended complete cessation of tobacco use. I have discussed various options available for assistance with tobacco cessation including over the counter methods (Nicotine gum, patch and lozenges). We also discussed prescription options (Chantix, Nicotine Inhaler / Nasal Spray). The patient is not interested in pursuing any prescription tobacco cessation options at this time.  Continue annual lung CA screening.

## 2022-07-09 LAB — TSH: TSH: 1.84 u[IU]/mL (ref 0.450–4.500)

## 2022-07-09 LAB — LIPID PANEL W/O CHOL/HDL RATIO
Cholesterol, Total: 129 mg/dL (ref 100–199)
HDL: 49 mg/dL (ref >39)
LDL Chol Calc (NIH): 68 mg/dL (ref 0–99)
Triglycerides: 52 mg/dL (ref 0–149)
VLDL Cholesterol Cal: 12 mg/dL (ref 5–40)

## 2022-07-09 NOTE — Progress Notes (Signed)
Contacted via MyChart   Good morning Sarah [name], your labs have returned and continue to look fabulous.  Continue all medications.  Any questions? Keep being sassy and fabulous!!  Thank you for allowing me to participate in your care.  I appreciate you. Kindest regards, Fantashia Shupert

## 2022-07-17 DIAGNOSIS — M1711 Unilateral primary osteoarthritis, right knee: Secondary | ICD-10-CM | POA: Diagnosis not present

## 2022-08-09 NOTE — Progress Notes (Unsigned)
Cardiology Office Note  Date:  08/10/2022   ID:  Sarah [name], DOB 03/17/1951, MRN [id number]  PCP:  Venita Lick, NP   Chief Complaint  Patient presents with   6 month follow up     "Doing well." Medications reviewed by the patient verbally.     HPI:  [name] is a 71 year old woman with past medical history of Chronic kidney disease, CR 2.1 Left nephrectomy stage III renal cell cancer 2018, followed by Dr. Candiss Norse Hypertension Smoker Emphysema Coronary artery disease, coronary calcification noted on CT scan Aortic atherosclerosis Presenting for follow-up of her mitral valve regurgitation, tricuspid valve regurgitation, moderately elevated right heart pressures, renal failure  Last seen by myself in clinic March 2023 Last clinic visit was taking benazepril HCTZ This has been held, now on Lasix 40 daily Creatinine up to 2.4  Chronic knee pain, tylenol and tramadol No regular exercise program  Denies shortness of breath on exertion Feels leg edema is minimal, Drinks lots of fluids she feels  Lab work reviewed Total chol 129, LDL 68 Creatinine 2.44, BUN in the 40s  EKG personally reviewed by myself on todays visit Normal sinus rhythm rate 55 bpm no significant ST-T wave changes  Echo, March 2023, reviewed 1. Left ventricular ejection fraction, by estimation, is 50 to 55%. The  left ventricle has low normal function. The left ventricle has no regional  wall motion abnormalities. Left ventricular diastolic parameters are  consistent with Grade I diastolic  dysfunction (impaired relaxation).   2. Right ventricular systolic function is normal. The right ventricular  size is normal. There is moderately elevated pulmonary artery systolic  pressure. The estimated right ventricular systolic pressure is 66.4 mmHg.   3. Left atrial size was moderately dilated.   4. The mitral valve is normal in structure. Moderate mitral valve  regurgitation.   5. Tricuspid  valve regurgitation is moderate.   PMH:   has a past medical history of Abnormal mammogram, unspecified, Anemia, Aneurysm ([place]) (2004), Anxiety, Arthritis, Benign neoplasm of breast (2012), Breast complaint (2010), Chronic kidney disease, Hypertension, Obesity, unspecified, Personal history of tobacco use, presenting hazards to health, Renal cancer, left ([place]) (11/2016), and Special screening for malignant neoplasms, colon.  PSH:    Past Surgical History:  Procedure Laterality Date   ABDOMINAL HYSTERECTOMY  1993   BREAST BIOPSY Right 2012   stereo bx showing a 63m radial scar in an estimated 8 volume of tissue   BREAST BIOPSY Right Apr 09, 2005   Fibrocystic changes, ductal adenosis, microcalcifications, focal stromal/epithelial proliferation, possible fibroadenoma type proliferation   CEREBRAL ANEURYSM REPAIR  2004   COLONOSCOPY  2008   Dr. KNicolasa Ducking  EYE SURGERY     FOOT SURGERY Right 2003   LAPAROSCOPIC NEPHRECTOMY, HAND ASSISTED Left 01/01/2017   Procedure: HAND ASSISTED LAPAROSCOPIC NEPHRECTOMY;  Surgeon: BNickie Retort MD;  Location: [facility] ORS;  Service: Urology;  Laterality: Left;   TONSILLECTOMY  1997    Current Outpatient Medications  Medication Sig Dispense Refill   acetaminophen (TYLENOL) 325 MG tablet Take 650 mg by mouth every 6 (six) hours as needed for mild pain, moderate pain or headache.      amLODipine (NORVASC) 5 MG tablet Take 1 tablet (5 mg total) by mouth daily. 90 tablet 4   atenolol (TENORMIN) 100 MG tablet Take 1 tablet (100 mg total) by mouth daily. 90 tablet 4   atorvastatin (LIPITOR) 40 MG tablet Take 1 tablet (40 mg total) by mouth  daily. 90 tablet 4   Cholecalciferol (VITAMIN D-3 PO) Take 600 Units by mouth daily.     citalopram (CELEXA) 20 MG tablet Take 1 tablet (20 mg total) by mouth daily. 90 tablet 4   furosemide (LASIX) 40 MG tablet Take 1 tablet (40 mg total) by mouth daily. 90 tablet 3   Loperamide HCl (IMODIUM PO) Take by mouth. PRN     Multiple  Vitamin (MULTIVITAMIN) tablet Take 1 tablet by mouth daily.     traMADol (ULTRAM) 50 MG tablet TAKE 1 TABLET BY MOUTH EVERY 12 HOURS AS NEEDED. 14 tablet 0   No current facility-administered medications for this visit.    Allergies:   Codeine   Social History:  The patient  reports that she has been smoking cigarettes. She has a 10.00 pack-year smoking history. She has never used smokeless tobacco. She reports current alcohol use. She reports that she does not use drugs.   Family History:   family history includes Cancer in her cousin, father, and another family member; Heart disease in her paternal grandfather and paternal grandmother; Hypertension in her brother, father, paternal grandfather, paternal grandmother, and sister.    Review of Systems: Review of Systems  Constitutional: Negative.   HENT: Negative.    Respiratory: Negative.    Cardiovascular: Negative.   Gastrointestinal: Negative.   Musculoskeletal: Negative.   Neurological: Negative.   Psychiatric/Behavioral: Negative.    All other systems reviewed and are negative.   PHYSICAL EXAM: VS:  BP 130/70 (BP Location: Left Arm, Patient Position: Sitting, Cuff Size: Normal)   Pulse (!) 55   Ht '5\' 6"'$  (1.676 m)   Wt 194 lb 8 oz (88.2 kg)   SpO2 98%   BMI 31.39 kg/m  , BMI Body mass index is 31.39 kg/m. Constitutional:  oriented to person, place, and time. No distress.  HENT:  Head: Grossly normal Eyes:  no discharge. No scleral icterus.  Neck: No JVD, no carotid bruits  Cardiovascular: Regular rate and rhythm, no murmurs appreciated Pulmonary/Chest: Clear to auscultation bilaterally, no wheezes or rails Abdominal: Soft.  no distension.  no tenderness.  Musculoskeletal: Normal range of motion Neurological:  normal muscle tone. Coordination normal. No atrophy Skin: Skin warm and dry Psychiatric: normal affect, pleasant  Recent Labs: 01/09/2022: ALT 14; Hemoglobin 10.5; Platelets 258 03/23/2022: BUN 37; Creatinine,  Ser 2.16; Potassium 4.0; Sodium 146 07/08/2022: TSH 1.840    Lipid Panel Lab Results  Component Value Date   CHOL 129 07/08/2022   HDL 49 07/08/2022   LDLCALC 68 07/08/2022   TRIG 52 07/08/2022      Wt Readings from Last 3 Encounters:  08/10/22 194 lb 8 oz (88.2 kg)  07/08/22 192 lb 4.8 oz (87.2 kg)  06/30/22 195 lb (88.5 kg)       ASSESSMENT AND PLAN:  Problem List Items Addressed This Visit       Cardiology Problems   Aortic atherosclerosis ([place])   Mitral valvular insufficiency   Tricuspid valve insufficiency   Other Visit Diagnoses     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 stage 3b chronic kidney disease ([place])    -  Primary   Pulmonary hypertension, unspecified ([place])       Coronary arteriosclerosis       Stage 3b chronic kidney disease ([place])         Pulmonary hypertension/mitral valve and tricuspid valve regurgitation Mitral valve and tricuspid valve regurgitation, elevated right heart pressures on echocardiogram March 2023  Off HCTZ, now on Lasix 40 daily Of concern creatinine up to 2.44 She is asymptomatic, denies significant leg swelling, no significant shortness of breath No medication changes made  aortic atherosclerosis Very mild disease noted Smoking cessation recommended Continue statin  Coronary calcification Minimal calcification noted, no ischemic work-up needed at this time Continue statin as above  Smoking We have encouraged her to continue to work on weaning her cigarettes and smoking cessation. She will continue to work on this and does not want any assistance with chantix.     Total encounter time more than 30 minutes  Greater than 50% was spent in counseling and coordination of care with the patient    Signed, Esmond Plants, M.D., Ph.D. [place], [place]

## 2022-08-10 ENCOUNTER — Ambulatory Visit: Payer: Medicare HMO | Attending: Cardiovascular Disease | Admitting: Cardiovascular Disease

## 2022-08-10 ENCOUNTER — Encounter: Payer: Self-pay | Admitting: Cardiovascular Disease

## 2022-08-10 DIAGNOSIS — I340 Nonrheumatic mitral (valve) insufficiency: Secondary | ICD-10-CM

## 2022-08-10 DIAGNOSIS — I700 Atherosclerosis of aorta: Secondary | ICD-10-CM

## 2022-08-10 DIAGNOSIS — I2510 Atherosclerotic heart disease of native coronary artery without angina pectoris: Secondary | ICD-10-CM | POA: Diagnosis not present

## 2022-08-10 DIAGNOSIS — I272 Pulmonary hypertension, unspecified: Secondary | ICD-10-CM | POA: Diagnosis not present

## 2022-08-10 DIAGNOSIS — N1832 Chronic kidney disease, stage 3b: Secondary | ICD-10-CM

## 2022-08-10 DIAGNOSIS — I1310 Hypertensive heart and chronic kidney disease without heart failure, with stage 1 through stage 4 chronic kidney disease, or unspecified chronic kidney disease: Secondary | ICD-10-CM | POA: Diagnosis not present

## 2022-08-10 DIAGNOSIS — I071 Rheumatic tricuspid insufficiency: Secondary | ICD-10-CM

## 2022-08-10 NOTE — Patient Instructions (Signed)
Medication Instructions:  No changes  If you need a refill on your cardiac medications before your next appointment, please call your pharmacy.   Lab work: No new labs needed  Testing/Procedures: No new testing needed  Follow-Up: At [facility], you and your health needs are our priority.  As part of our continuing mission to provide you with exceptional heart care, we have created designated Provider Care Teams.  These Care Teams include your primary Cardiologist (physician) and Advanced Practice Providers (APPs -  Physician Assistants and Nurse Practitioners) who all work together to provide you with the care you need, when you need it.  You will need a follow up appointment in 12 months  Providers on your designated Care Team:   Christopher Berge, NP Ryan Dunn, PA-C Cadence Furth, PA-C  COVID-19 Vaccine Information can be found at: [email or web address] For questions related to vaccine distribution or appointments, please email [email or web address] or call [phone].   

## 2022-08-13 NOTE — Addendum Note (Signed)
Addended by: Anselm Pancoast on: 08/13/2022 10:44 AM   Modules accepted: Orders

## 2022-08-27 ENCOUNTER — Ambulatory Visit: Admission: RE | Admit: 2022-08-27 | Payer: Medicare HMO | Source: Ambulatory Visit

## 2022-09-18 DIAGNOSIS — M1711 Unilateral primary osteoarthritis, right knee: Secondary | ICD-10-CM | POA: Diagnosis not present

## 2022-09-24 ENCOUNTER — Telehealth: Payer: Self-pay | Admitting: *Deleted

## 2022-09-24 NOTE — Telephone Encounter (Signed)
Left message on voicemail to return call to schedule follow up LCS CT scan. Pt. Was a no show on 08/27/2022.

## 2022-11-18 ENCOUNTER — Telehealth: Payer: Self-pay

## 2022-11-18 NOTE — Telephone Encounter (Signed)
Returned call to schedule annual LDCT. No answer.  Left Vm and call back information

## 2022-11-26 NOTE — Telephone Encounter (Signed)
Returned call to pt. Left VM for pt to call back to schedule annual lung screening CT.

## 2022-11-27 NOTE — Telephone Encounter (Signed)
Returned call from VM.  VM messages states patient has called several times with no call back from [place].  When calling, the phone goes straight to VM. She may have blocker on phone that bypasses our return calls.  Left another VM that we called to schedule LDCT.

## 2022-12-03 DIAGNOSIS — D631 Anemia in chronic kidney disease: Secondary | ICD-10-CM | POA: Diagnosis not present

## 2022-12-03 DIAGNOSIS — Z905 Acquired absence of kidney: Secondary | ICD-10-CM | POA: Diagnosis not present

## 2022-12-03 DIAGNOSIS — N179 Acute kidney failure, unspecified: Secondary | ICD-10-CM | POA: Diagnosis not present

## 2022-12-03 DIAGNOSIS — N184 Chronic kidney disease, stage 4 (severe): Secondary | ICD-10-CM | POA: Diagnosis not present

## 2022-12-03 DIAGNOSIS — N281 Cyst of kidney, acquired: Secondary | ICD-10-CM | POA: Diagnosis not present

## 2022-12-03 DIAGNOSIS — I10 Essential (primary) hypertension: Secondary | ICD-10-CM | POA: Diagnosis not present

## 2022-12-03 DIAGNOSIS — I129 Hypertensive chronic kidney disease with stage 1 through stage 4 chronic kidney disease, or unspecified chronic kidney disease: Secondary | ICD-10-CM | POA: Diagnosis not present

## 2022-12-03 DIAGNOSIS — N1832 Chronic kidney disease, stage 3b: Secondary | ICD-10-CM | POA: Diagnosis not present

## 2022-12-07 DIAGNOSIS — D631 Anemia in chronic kidney disease: Secondary | ICD-10-CM | POA: Diagnosis not present

## 2022-12-07 DIAGNOSIS — N184 Chronic kidney disease, stage 4 (severe): Secondary | ICD-10-CM | POA: Diagnosis not present

## 2022-12-07 DIAGNOSIS — N2581 Secondary hyperparathyroidism of renal origin: Secondary | ICD-10-CM | POA: Diagnosis not present

## 2022-12-07 DIAGNOSIS — I129 Hypertensive chronic kidney disease with stage 1 through stage 4 chronic kidney disease, or unspecified chronic kidney disease: Secondary | ICD-10-CM | POA: Diagnosis not present

## 2022-12-07 DIAGNOSIS — I10 Essential (primary) hypertension: Secondary | ICD-10-CM | POA: Diagnosis not present

## 2022-12-16 ENCOUNTER — Other Ambulatory Visit: Payer: Self-pay | Admitting: Nurse Practitioner

## 2022-12-16 ENCOUNTER — Other Ambulatory Visit: Payer: Self-pay | Admitting: Cardiovascular Disease

## 2022-12-16 NOTE — Telephone Encounter (Signed)
Requested medication (s) are due for refill today: yes  Requested medication (s) are on the active medication list: yes  Last refill:  05/25/22 #14/0  Future visit scheduled: yes  Notes to clinic:  Unable to refill per protocol, cannot delegate.    Requested Prescriptions  Pending Prescriptions Disp Refills   traMADol (ULTRAM) 50 MG tablet [Pharmacy Med Name: TRAMADOL HCL 50 MG TABLET] 14 tablet     Sig: TAKE 1 TABLET BY MOUTH EVERY 12 HOURS AS NEEDED.     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

## 2022-12-30 DIAGNOSIS — M1711 Unilateral primary osteoarthritis, right knee: Secondary | ICD-10-CM | POA: Diagnosis not present

## 2023-01-03 NOTE — Patient Instructions (Signed)
Cholesterol Content in Foods ?Cholesterol is a waxy, fat-like substance that helps to carry fat in the blood. The body needs cholesterol in small amounts, but too much cholesterol can cause damage to the arteries and heart. ?What foods have cholesterol? ? ?Cholesterol is found in animal-based foods, such as meat, seafood, and dairy. Generally, low-fat dairy and lean meats have less cholesterol than full-fat dairy and fatty meats. The milligrams of cholesterol per serving (mg per serving) of common cholesterol-containing foods are listed below. ?Meats and other proteins ?Egg -- one large whole egg has 186 mg. ?Veal shank -- 4 oz (113 g) has 141 mg. ?Lean ground turkey (93% lean) -- 4 oz (113 g) has 118 mg. ?Fat-trimmed lamb loin -- 4 oz (113 g) has 106 mg. ?Lean ground beef (90% lean) -- 4 oz (113 g) has 100 mg. ?Lobster -- 3.5 oz (99 g) has 90 mg. ?Pork loin chops -- 4 oz (113 g) has 86 mg. ?Canned salmon -- 3.5 oz (99 g) has 83 mg. ?Fat-trimmed beef top loin -- 4 oz (113 g) has 78 mg. ?Frankfurter -- 1 frank (3.5 oz or 99 g) has 77 mg. ?Crab -- 3.5 oz (99 g) has 71 mg. ?Roasted chicken without skin, white meat -- 4 oz (113 g) has 66 mg. ?Light bologna -- 2 oz (57 g) has 45 mg. ?Deli-cut turkey -- 2 oz (57 g) has 31 mg. ?Canned tuna -- 3.5 oz (99 g) has 31 mg. ?Bacon -- 1 oz (28 g) has 29 mg. ?Oysters and mussels (raw) -- 3.5 oz (99 g) has 25 mg. ?Mackerel -- 1 oz (28 g) has 22 mg. ?Trout -- 1 oz (28 g) has 20 mg. ?Pork sausage -- 1 link (1 oz or 28 g) has 17 mg. ?Salmon -- 1 oz (28 g) has 16 mg. ?Tilapia -- 1 oz (28 g) has 14 mg. ?Dairy ?Soft-serve ice cream -- ? cup (4 oz or 86 g) has 103 mg. ?Whole-milk yogurt -- 1 cup (8 oz or 245 g) has 29 mg. ?Cheddar cheese -- 1 oz (28 g) has 28 mg. ?American cheese -- 1 oz (28 g) has 28 mg. ?Whole milk -- 1 cup (8 oz or 250 mL) has 23 mg. ?2% milk -- 1 cup (8 oz or 250 mL) has 18 mg. ?Cream cheese -- 1 tablespoon (Tbsp) (14.5 g) has 15 mg. ?Cottage cheese -- ? cup (4 oz or  113 g) has 14 mg. ?Low-fat (1%) milk -- 1 cup (8 oz or 250 mL) has 10 mg. ?Sour cream -- 1 Tbsp (12 g) has 8.5 mg. ?Low-fat yogurt -- 1 cup (8 oz or 245 g) has 8 mg. ?Nonfat Greek yogurt -- 1 cup (8 oz or 228 g) has 7 mg. ?Half-and-half cream -- 1 Tbsp (15 mL) has 5 mg. ?Fats and oils ?Cod liver oil -- 1 tablespoon (Tbsp) (13.6 g) has 82 mg. ?Butter -- 1 Tbsp (14 g) has 15 mg. ?Lard -- 1 Tbsp (12.8 g) has 14 mg. ?Bacon grease -- 1 Tbsp (12.9 g) has 14 mg. ?Mayonnaise -- 1 Tbsp (13.8 g) has 5-10 mg. ?Margarine -- 1 Tbsp (14 g) has 3-10 mg. ?The items listed above may not be a complete list of foods with cholesterol. Exact amounts of cholesterol in these foods may vary depending on specific ingredients and brands. Contact a dietitian for more information. ?What foods do not have cholesterol? ?Most plant-based foods do not have cholesterol unless you combine them with a food that has   cholesterol. Foods without cholesterol include: ?Grains and cereals. ?Vegetables. ?Fruits. ?Vegetable oils, such as olive, canola, and sunflower oil. ?Legumes, such as peas, beans, and lentils. ?Nuts and seeds. ?Egg whites. ?The items listed above may not be a complete list of foods that do not have cholesterol. Contact a dietitian for more information. ?Summary ?The body needs cholesterol in small amounts, but too much cholesterol can cause damage to the arteries and heart. ?Cholesterol is found in animal-based foods, such as meat, seafood, and dairy. Generally, low-fat dairy and lean meats have less cholesterol than full-fat dairy and fatty meats. ?This information is not intended to replace advice given to you by your health care provider. Make sure you discuss any questions you have with your health care provider. ?Document Revised: 03/28/2021 Document Reviewed: 03/28/2021 ?Elsevier Patient Education ? 2023 Elsevier Inc. ? ?

## 2023-01-08 ENCOUNTER — Ambulatory Visit (INDEPENDENT_AMBULATORY_CARE_PROVIDER_SITE_OTHER): Payer: Medicare HMO | Admitting: Nurse Practitioner

## 2023-01-08 ENCOUNTER — Encounter: Payer: Self-pay | Admitting: Nurse Practitioner

## 2023-01-08 VITALS — BP 136/82 | HR 57 | Temp 98.3°F | Ht 67.01 in | Wt 197.7 lb

## 2023-01-08 DIAGNOSIS — N184 Chronic kidney disease, stage 4 (severe): Secondary | ICD-10-CM | POA: Diagnosis not present

## 2023-01-08 DIAGNOSIS — I340 Nonrheumatic mitral (valve) insufficiency: Secondary | ICD-10-CM

## 2023-01-08 DIAGNOSIS — I1310 Hypertensive heart and chronic kidney disease without heart failure, with stage 1 through stage 4 chronic kidney disease, or unspecified chronic kidney disease: Secondary | ICD-10-CM | POA: Diagnosis not present

## 2023-01-08 DIAGNOSIS — F17210 Nicotine dependence, cigarettes, uncomplicated: Secondary | ICD-10-CM

## 2023-01-08 DIAGNOSIS — E6609 Other obesity due to excess calories: Secondary | ICD-10-CM

## 2023-01-08 DIAGNOSIS — I361 Nonrheumatic tricuspid (valve) insufficiency: Secondary | ICD-10-CM

## 2023-01-08 DIAGNOSIS — R69 Illness, unspecified: Secondary | ICD-10-CM | POA: Diagnosis not present

## 2023-01-08 DIAGNOSIS — D631 Anemia in chronic kidney disease: Secondary | ICD-10-CM

## 2023-01-08 DIAGNOSIS — Z6831 Body mass index (BMI) 31.0-31.9, adult: Secondary | ICD-10-CM

## 2023-01-08 DIAGNOSIS — E66811 Obesity, class 1: Secondary | ICD-10-CM

## 2023-01-08 DIAGNOSIS — J432 Centrilobular emphysema: Secondary | ICD-10-CM | POA: Diagnosis not present

## 2023-01-08 DIAGNOSIS — G8929 Other chronic pain: Secondary | ICD-10-CM

## 2023-01-08 DIAGNOSIS — E7800 Pure hypercholesterolemia, unspecified: Secondary | ICD-10-CM | POA: Diagnosis not present

## 2023-01-08 DIAGNOSIS — C642 Malignant neoplasm of left kidney, except renal pelvis: Secondary | ICD-10-CM

## 2023-01-08 DIAGNOSIS — Q6102 Congenital multiple renal cysts: Secondary | ICD-10-CM

## 2023-01-08 DIAGNOSIS — Z59869 Financial insecurity, unspecified: Secondary | ICD-10-CM

## 2023-01-08 DIAGNOSIS — Z5986 Financial insecurity: Secondary | ICD-10-CM

## 2023-01-08 DIAGNOSIS — M25561 Pain in right knee: Secondary | ICD-10-CM | POA: Diagnosis not present

## 2023-01-08 DIAGNOSIS — I700 Atherosclerosis of aorta: Secondary | ICD-10-CM

## 2023-01-08 DIAGNOSIS — F324 Major depressive disorder, single episode, in partial remission: Secondary | ICD-10-CM

## 2023-01-08 MED ORDER — ATENOLOL 100 MG PO TABS: 100.0000 mg | ORAL_TABLET | Freq: Every day | ORAL | 4 refills | Status: DC

## 2023-01-08 MED ORDER — ATORVASTATIN CALCIUM 40 MG PO TABS: 40.0000 mg | ORAL_TABLET | Freq: Every day | ORAL | 4 refills | Status: DC

## 2023-01-08 MED ORDER — CITALOPRAM HYDROBROMIDE 20 MG PO TABS: 20.0000 mg | ORAL_TABLET | Freq: Every day | ORAL | 4 refills | Status: DC

## 2023-01-08 MED ORDER — AMLODIPINE BESYLATE 5 MG PO TABS: 5.0000 mg | ORAL_TABLET | Freq: Every day | ORAL | 4 refills | Status: DC

## 2023-01-08 NOTE — Assessment & Plan Note (Signed)
Chronic. Noted on lung CT screenings.  Continue statin daily and recommend modest weight loss + focus on healthy diet.  Complete cessation of smoking recommended.

## 2023-01-08 NOTE — Assessment & Plan Note (Signed)
I have recommended complete cessation of tobacco use. I have discussed various options available for assistance with tobacco cessation including over the counter methods (Nicotine gum, patch and lozenges). We also discussed prescription options (Chantix, Nicotine Inhaler / Nasal Spray). The patient is not interested in pursuing any prescription tobacco cessation options at this time.  Continue annual lung CA screening -- is due for this, will reach out to team.

## 2023-01-08 NOTE — Assessment & Plan Note (Signed)
Chronic.  Noted on echo 02/03/22.  Asymptomatic.  Continue current medication regimen and collaboration with cardiology.

## 2023-01-08 NOTE — Assessment & Plan Note (Signed)
Chronic, ongoing. Initial BP elevated and repeat coming down to goal.  Followed by nephrology. BP at goal today, recommend she continue to check at home and monitor diet, DASH diet. Continue current medication regimen and adjust as needed + collaboration with nephrology.  Recent labs with nephrology obtained and reviewed today.  Return in 6 months.

## 2023-01-08 NOTE — Assessment & Plan Note (Signed)
Chronic, ongoing.  Recent labs obtained with nephrology.  Continue current medication regimen and collaboration with nephrology.   Reviewed recent labs and note.

## 2023-01-08 NOTE — Assessment & Plan Note (Signed)
Chronic, stable without inhalers at this time.  Recommend complete cessation of smoking.  Spirometry March 2023 noting FEV1 96% and FEV1/FVC 112% -- reviewed with patient at the time.  Continue yearly lung screening + collaboration with cardiology.

## 2023-01-08 NOTE — Assessment & Plan Note (Signed)
Chronic, ongoing.  Followed by nephrology -- recent labs reviewed and note.

## 2023-01-08 NOTE — Assessment & Plan Note (Signed)
BMI 30.96. Recommended eating smaller high protein, low fat meals more frequently and exercising 30 mins a day 5 times a week with a goal of 10-15lb weight loss in the next 3 months. Patient voiced their understanding and motivation to adhere to these recommendations.

## 2023-01-08 NOTE — Assessment & Plan Note (Signed)
Followed by Dr. Grayland Ormond in past, has been discharged at this time.  Continue this collaboration as needed and review of notes.  Continue to follow with nephrology.

## 2023-01-08 NOTE — Progress Notes (Signed)
BP 136/82 (BP Location: Left Arm, Patient Position: Sitting)   Pulse (!) 57   Temp 98.3 F (36.8 C) (Oral)   Ht 5' 7.01" (1.702 m)   Wt 197 lb 11.2 oz (89.7 kg)   SpO2 98%   BMI 30.96 kg/m    Subjective:    Patient ID: Sarah [name], female    DOB: 03/29/1951, 72 y.o.   MRN: [id number]  HPI: Sarah [name] is a 72 y.o. female  Chief Complaint  Patient presents with   Hyperlipidemia   Hypertension   COPD   Chronic Kidney Disease   Knee Pain   Renal Cancer   Lost job recently and needs to do emergency move -- having some difficulty with financial issues.  HYPERTENSION / HYPERLIPIDEMIA Continues on Amlodipine, Atenolol, Lasix, and Atorvastatin.  Was on Benazepril/HCTZ in past but this was stopped due to low BP.  Sees Dr. Rockey Situ next on 08/10/22.   Satisfied with current treatment? yes Duration of hypertension: chronic BP monitoring frequency: not checking BP range: BP medication side effects: no Duration of hyperlipidemia: chronic Cholesterol medication side effects: no Cholesterol supplements: none Medication compliance: good compliance Aspirin: no Recent stressors: yes Recurrent headaches: no Visual changes: no Palpitations: no Dyspnea: no Chest pain: no Lower extremity edema: no Dizzy/lightheaded: no  The ASCVD Risk score (Arnett DK, et al., 2019) failed to calculate for the following reasons:   The valid total cholesterol range is 130 to 320 mg/dL  CHRONIC KIDNEY DISEASE Follows with nephrology, last visit 12/07/22 - eGFR 23, CRT 2.26, BUN 34, PTH 137, H/H 10.7/32.0.  CKD status: stable Medications renally dose: yes Previous renal evaluation: yes Pneumovax:  Up to Date Influenza Vaccine:  Up to Date   RENAL CANCER STAGE III: Followed oncology and urology.  History of left nephrectomy on January 01, 2017.  Required abdominal/pelvic CT scan approximately every 6 months for 3 years after her nephrectomy, last was on 01/09/22 remained stable.  Urology last  06/30/22 and oncology 01/15/22. Is discharged from oncology at this time.   COPD Is a current every day smoker, is reducing this to 2 cigarettes.  Has smoked since her teenage years. Lung CA screening 08/27/21 noting centrilobular emphysema and aortic atherosclerosis + a stable 3 cm adrenal nodule. No current inhalers.   CT lung also noted possible pulmonary arterial hypertension -- she denies palpitations, fatigue, edema. COPD status: stable Satisfied with current treatment?: yes Oxygen use: no Dyspnea frequency: no Cough frequency: no Rescue inhaler frequency: no Limitation of activity: no Productive cough: none Last Spirometry: March 2023 Pneumovax: Up to Date Influenza: Up to Date  KNEE PAIN Chronic right knee pain for several years with Tramadol prescribed by previous PCP to use as needed. Last imaging in 2016. Minimally uses, 14 pills lasts 5-6 months. Last filled 12/16/22.  Currently follows with Dr. Mack Guise at [facility], received injection at end of January. Duration: chronic Involved knee: right Mechanism of injury: unknown Location:anterior Onset: gradual Severity: 2/10 Quality:  sharp, dull, aching, and throbbing Frequency: intermittent Radiation: no Aggravating factors: weight bearing, walking, stairs, bending, and movement  Alleviating factors:  Tramadol, ice, APAP, and rest  Status: worse Treatments attempted: rest, ice, and APAP , knee injections, Tramadol Relief with NSAIDs?:  moderate Weakness with weight bearing or walking: no Sensation of giving way: no Locking: no Popping: yes Bruising: no Swelling: no Redness: no Paresthesias/decreased sensation: no Fevers: no    DEPRESSION Continues on Celexa 20 MG, exacerbated by current stressors.  Mood status: controlled Satisfied with current treatment?: yes Symptom severity: mild  Duration of current treatment : chronic Side effects: no Medication compliance: excellent compliance Psychotherapy/counseling:  none Anxious mood: yes Anhedonia: no Significant weight loss or gain: no Insomnia: yes, hard to stay asleep Fatigue: no Feelings of worthlessness or guilt: yes Impaired concentration/indecisiveness: no Suicidal ideations: no Hopelessness: no Crying spells: no    01/08/2023    3:01 PM 07/08/2022   11:16 AM 03/02/2022   11:02 AM 01/08/2022   10:59 AM 07/09/2021   10:37 AM  Depression screen PHQ 2/9  Decreased Interest 2 0 0 0 0  Down, Depressed, Hopeless 2 0 0 0 0  PHQ - 2 Score 4 0 0 0 0  Altered sleeping 2 0 0 0 0  Tired, decreased energy 2 0 2 1 1  $ Change in appetite 2 0 0 0 0  Feeling bad or failure about yourself  2 0 0 0 0  Trouble concentrating 0 0 0 0 0  Moving slowly or fidgety/restless 0 0 0 0 0  Suicidal thoughts 0 0 0 0 0  PHQ-9 Score 12 0 2 1 1  $ Difficult doing work/chores  Not difficult at all   Not difficult at all       01/08/2023    3:01 PM 07/08/2022   11:16 AM 01/08/2022   10:59 AM 12/28/2018   10:49 AM  GAD 7 : Generalized Anxiety Score  Nervous, Anxious, on Edge 1 0 0 0  Control/stop worrying 2 0 0 0  Worry too much - different things 2 0 0 0  Trouble relaxing 1 0 0 0  Restless 0 0 0 0  Easily annoyed or irritable 0 0 0 0  Afraid - awful might happen 1 0 0 0  Total GAD 7 Score 7 0 0 0  Anxiety Difficulty   Not difficult at all Not difficult at all   Relevant past medical, surgical, family and social history reviewed and updated as indicated. Interim medical history since our last visit reviewed. Allergies and medications reviewed and updated.  Review of Systems  Constitutional:  Negative for activity change, appetite change, diaphoresis, fatigue and fever.  Respiratory:  Negative for cough, chest tightness and shortness of breath.   Cardiovascular:  Negative for chest pain, palpitations and leg swelling.  Gastrointestinal: Negative.   Musculoskeletal:  Positive for arthralgias.  Neurological:  Negative for dizziness, syncope, weakness, light-headedness,  numbness and headaches.  Psychiatric/Behavioral: Negative.      Per HPI unless specifically indicated above     Objective:    BP 136/82 (BP Location: Left Arm, Patient Position: Sitting)   Pulse (!) 57   Temp 98.3 F (36.8 C) (Oral)   Ht 5' 7.01" (1.702 m)   Wt 197 lb 11.2 oz (89.7 kg)   SpO2 98%   BMI 30.96 kg/m   Wt Readings from Last 3 Encounters:  01/08/23 197 lb 11.2 oz (89.7 kg)  08/10/22 194 lb 8 oz (88.2 kg)  07/08/22 192 lb 4.8 oz (87.2 kg)    Physical Exam Vitals and nursing note reviewed.  Constitutional:      General: She is awake. She is not in acute distress.    Appearance: She is well-developed. She is obese. She is not ill-appearing.  HENT:     Head: Normocephalic.     Right Ear: Hearing normal.     Left Ear: Hearing normal.     Nose: Nose normal.     Mouth/Throat:  Mouth: Mucous membranes are moist.  Eyes:     General: Lids are normal.        Right eye: No discharge.        Left eye: No discharge.     Conjunctiva/sclera: Conjunctivae normal.     Pupils: Pupils are equal, round, and reactive to light.  Neck:     Vascular: No carotid bruit.  Cardiovascular:     Rate and Rhythm: Normal rate and regular rhythm.     Heart sounds: Murmur heard.     Systolic murmur is present with a grade of 2/6.     No gallop.  Pulmonary:     Effort: Pulmonary effort is normal.     Breath sounds: Normal breath sounds.  Abdominal:     General: Bowel sounds are normal.     Palpations: Abdomen is soft.  Musculoskeletal:     Cervical back: Normal range of motion and neck supple.     Right lower leg: No edema.     Left lower leg: No edema.  Skin:    General: Skin is warm and dry.  Neurological:     Mental Status: She is alert and oriented to person, place, and time.  Psychiatric:        Attention and Perception: Attention normal.        Mood and Affect: Mood normal.        Behavior: Behavior normal. Behavior is cooperative.        Thought Content: Thought  content normal.        Judgment: Judgment normal.    Results for orders placed or performed in visit on 07/08/22  Lipid Panel w/o Chol/HDL Ratio  Result Value Ref Range   Cholesterol, Total 129 100 - 199 mg/dL   Triglycerides 52 0 - 149 mg/dL   HDL 49 >39 mg/dL   VLDL Cholesterol Cal 12 5 - 40 mg/dL   LDL Chol Calc (NIH) 68 0 - 99 mg/dL  TSH  Result Value Ref Range   TSH 1.840 0.450 - 4.500 uIU/mL      Assessment & Plan:   Problem List Items Addressed This Visit       Cardiovascular and Mediastinum   Aortic atherosclerosis (HCC)    Chronic. Noted on lung CT screenings.  Continue statin daily and recommend modest weight loss + focus on healthy diet.  Complete cessation of smoking recommended.      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 Initial BP elevated and repeat coming down to goal.  Followed by nephrology. BP at goal today, recommend she continue to check at home and monitor diet, DASH diet. Continue current medication regimen and adjust as needed + collaboration with nephrology.  Recent labs with nephrology obtained and reviewed today.  Return in 6 months.        Relevant Medications   atenolol (TENORMIN) 100 MG tablet   atorvastatin (LIPITOR) 40 MG tablet   amLODipine (NORVASC) 5 MG tablet   Mitral valvular insufficiency    Chronic.  Noted on echo 02/03/22.  Asymptomatic.  Continue current medication regimen and collaboration with cardiology.      Relevant Medications   atenolol (TENORMIN) 100 MG tablet   atorvastatin (LIPITOR) 40 MG tablet   amLODipine (NORVASC) 5  MG tablet   Tricuspid valve insufficiency    Chronic.  Noted on echo 02/03/22.  Asymptomatic.  Continue current medication regimen and collaboration with cardiology.      Relevant  Medications   atenolol (TENORMIN) 100 MG tablet   atorvastatin (LIPITOR) 40 MG tablet   amLODipine (NORVASC) 5 MG tablet     Respiratory   Pulmonary emphysema (HCC)    Chronic, stable without inhalers at this time.  Recommend complete cessation of smoking.  Spirometry March 2023 noting FEV1 96% and FEV1/FVC 112% -- reviewed with patient at the time.  Continue yearly lung screening + collaboration with cardiology.        Genitourinary   Anemia in chronic kidney disease    Chronic, ongoing.  Followed by nephrology -- recent labs reviewed and note.      CKD (chronic kidney disease) stage 4, GFR 15-29 ml/min (HCC)    Chronic, ongoing.  Recent labs obtained with nephrology.  Continue current medication regimen and collaboration with nephrology.   Reviewed recent labs and note.       Renal cancer, left ([place]) - Primary    Followed by Dr. Grayland Ormond in past, has been discharged at this time.  Continue this collaboration as needed and review of notes.  Continue to follow with nephrology.        Other   Chronic pain of right knee    Chronic, ongoing with very rare Tramadol use.  Recent refills sent, obtain UDS and controlled substance agreement next visit.  Continue collaboration with ortho.      Depression    Chronic, ongoing.  Continue current medication regimen and adjust as needed.  Refills sent in as needed.  Denies SI/HI.        Relevant Medications   citalopram (CELEXA) 20 MG tablet   Hyperlipidemia    Chronic, ongoing.  Continue current medication regimen and adjust as needed.  Lipid panel next visit.      Relevant Medications   atenolol (TENORMIN) 100 MG tablet   atorvastatin (LIPITOR) 40 MG tablet   amLODipine (NORVASC) 5 MG tablet   Nicotine dependence, cigarettes, uncomplicated    I have recommended complete cessation of tobacco use. I have discussed various options available for assistance with tobacco cessation including over the counter methods (Nicotine gum, patch and  lozenges). We also discussed prescription options (Chantix, Nicotine Inhaler / Nasal Spray). The patient is not interested in pursuing any prescription tobacco cessation options at this time.  Continue annual lung CA screening -- is due for this, will reach out to team.       Obesity    BMI 30.96. Recommended eating smaller high protein, low fat meals more frequently and exercising 30 mins a day 5 times a week with a goal of 10-15lb weight loss in the next 3 months. Patient voiced their understanding and motivation to adhere to these recommendations.       Other Visit Diagnoses     Financial insecurity       Referral to [facility] team.   Relevant Orders   AMB Referral to [facility] ([facility] Patients)        Follow up plan: Return in about 6 months (around 07/09/2023) for HTN/HLD, COPD, CKD, RENAL CA, MOOD.

## 2023-01-08 NOTE — Assessment & Plan Note (Signed)
Chronic, ongoing.  Continue current medication regimen and adjust as needed.  Refills sent in as needed.  Denies SI/HI.

## 2023-01-08 NOTE — Assessment & Plan Note (Signed)
Chronic, ongoing with very rare Tramadol use.  Recent refills sent, obtain UDS and controlled substance agreement next visit.  Continue collaboration with ortho.

## 2023-01-08 NOTE — Assessment & Plan Note (Signed)
Chronic, ongoing.  Continue current medication regimen and adjust as needed.  Lipid panel next visit.

## 2023-01-14 ENCOUNTER — Telehealth: Payer: Self-pay | Admitting: *Deleted

## 2023-01-14 NOTE — Telephone Encounter (Signed)
   Telephone encounter was:  Successful.  01/14/2023 Name: Sarah [name] MRN: [id number] DOB: August 02, 1951  [name] is a 72 y.o. year old female who is a primary care patient of Cannady, Barbaraann Faster, NP . The community resource team was consulted for assistance with Food Insecurity and housing  Patient being mailed a housing sheet and also local food banks  Care guide performed the following interventions: Patient provided with information about care guide support team and interviewed to confirm resource needs.  Follow Up Plan:  No further follow up planned at this time. The patient has been provided with needed resources.  [place] [phone] [address] , [place] [address] Email : [email or web address]

## 2023-02-09 ENCOUNTER — Telehealth: Payer: Self-pay | Admitting: Nurse Practitioner

## 2023-02-09 NOTE — Telephone Encounter (Signed)
Contacted Ronin C Collignon to schedule their annual wellness visit. Appointment made for 03/08/2023.  Sherol Dade; Care Guide [facility] Direct Dial: [phone]

## 2023-02-09 NOTE — Telephone Encounter (Signed)
Copied from [place] [id number]. Topic: Medicare AWV >> Feb 09, 2023  1:35 PM Devoria Glassing wrote: Reason for CRM: Called patient to schedule Medicare Annual Wellness Visit (AWV). Left message for patient to call back and schedule Medicare Annual Wellness Visit (AWV).  Last date of AWV: 03/02/2022   Please schedule an appointment at any time with Kirke Shaggy, LPN .  If any questions, please contact me.  Thank you ,  Sherol Dade; [place] Direct Dial: [phone]

## 2023-03-29 ENCOUNTER — Ambulatory Visit (INDEPENDENT_AMBULATORY_CARE_PROVIDER_SITE_OTHER): Payer: Medicare HMO

## 2023-03-29 VITALS — Ht 67.0 in | Wt 197.0 lb

## 2023-03-29 DIAGNOSIS — Z0000 Encounter for general adult medical examination without abnormal findings: Secondary | ICD-10-CM | POA: Diagnosis not present

## 2023-03-29 DIAGNOSIS — Z1231 Encounter for screening mammogram for malignant neoplasm of breast: Secondary | ICD-10-CM

## 2023-03-29 NOTE — Patient Instructions (Signed)
Sarah [name] , Thank you for taking time to come for your Medicare Wellness Visit. I appreciate your ongoing commitment to your health goals. Please review the following plan we discussed and let me know if I can assist you in the future.   These are the goals we discussed:  Goals      DIET - EAT MORE FRUITS AND VEGETABLES     Patient Stated     02/28/2021, wants to retire soon     Quit Smoking     Smoking cessation disscussed     Quit Smoking     Quit smoking / using tobacco        This is a list of the screening recommended for you and due dates:  Health Maintenance  Topic Date Due   Mammogram  08/29/2021   COVID-19 Vaccine (6 - 2023-24 season) 04/13/2023   Flu Shot  07/01/2023   Medicare Annual Wellness Visit  03/28/2024   Colon Cancer Screening  12/24/2025   DTaP/Tdap/Td vaccine (3 - Td or Tdap) 06/09/2026   Pneumonia Vaccine  Completed   DEXA scan (bone density measurement)  Completed   Hepatitis C Screening: USPSTF Recommendation to screen - Ages 1-79 yo.  Completed   Zoster (Shingles) Vaccine  Completed   HPV Vaccine  Aged Out    Advanced directives: no  Conditions/risks identified: none  Next appointment: Follow up in one year for your annual wellness visit 04/03/24 @ 1:00 pm by phone   Preventive Care 65 Years and Older, Female Preventive care refers to lifestyle choices and visits with your health care provider that can promote health and wellness. What does preventive care include? A yearly physical exam. This is also called an annual well check. Dental exams once or twice a year. Routine eye exams. Ask your health care provider how often you should have your eyes checked. Personal lifestyle choices, including: Daily care of your teeth and gums. Regular physical activity. Eating a healthy diet. Avoiding tobacco and drug use. Limiting alcohol use. Practicing safe sex. Taking low-dose aspirin every day. Taking vitamin and mineral supplements as recommended  by your health care provider. What happens during an annual well check? The services and screenings done by your health care provider during your annual well check will depend on your age, overall health, lifestyle risk factors, and family history of disease. Counseling  Your health care provider may ask you questions about your: Alcohol use. Tobacco use. Drug use. Emotional well-being. Home and relationship well-being. Sexual activity. Eating habits. History of falls. Memory and ability to understand (cognition). Work and work Astronomer. Reproductive health. Screening  You may have the following tests or measurements: Height, weight, and BMI. Blood pressure. Lipid and cholesterol levels. These may be checked every 5 years, or more frequently if you are over 58 years old. Skin check. Lung cancer screening. You may have this screening every year starting at age 87 if you have a 30-pack-year history of smoking and currently smoke or have quit within the past 15 years. Fecal occult blood test (FOBT) of the stool. You may have this test every year starting at age 20. Flexible sigmoidoscopy or colonoscopy. You may have a sigmoidoscopy every 5 years or a colonoscopy every 10 years starting at age 4. Hepatitis C blood test. Hepatitis B blood test. Sexually transmitted disease (STD) testing. Diabetes screening. This is done by checking your blood sugar (glucose) after you have not eaten for a while (fasting). You may have this done every  1-3 years. Bone density scan. This is done to screen for osteoporosis. You may have this done starting at age 74. Mammogram. This may be done every 1-2 years. Talk to your health care provider about how often you should have regular mammograms. Talk with your health care provider about your test results, treatment options, and if necessary, the need for more tests. Vaccines  Your health care provider may recommend certain vaccines, such as: Influenza  vaccine. This is recommended every year. Tetanus, diphtheria, and acellular pertussis (Tdap, Td) vaccine. You may need a Td booster every 10 years. Zoster vaccine. You may need this after age 57. Pneumococcal 13-valent conjugate (PCV13) vaccine. One dose is recommended after age 57. Pneumococcal polysaccharide (PPSV23) vaccine. One dose is recommended after age 78. Talk to your health care provider about which screenings and vaccines you need and how often you need them. This information is not intended to replace advice given to you by your health care provider. Make sure you discuss any questions you have with your health care provider. Document Released: 12/13/2015 Document Revised: 08/05/2016 Document Reviewed: 09/17/2015 Elsevier Interactive Patient Education  2017 [place] Prevention in the Home Falls can cause injuries. They can happen to people of all ages. There are many things you can do to make your home safe and to help prevent falls. What can I do on the outside of my home? Regularly fix the edges of walkways and driveways and fix any cracks. Remove anything that might make you trip as you walk through a door, such as a raised step or threshold. Trim any bushes or trees on the path to your home. Use bright outdoor lighting. Clear any walking paths of anything that might make someone trip, such as rocks or tools. Regularly check to see if handrails are loose or broken. Make sure that both sides of any steps have handrails. Any raised decks and porches should have guardrails on the edges. Have any leaves, snow, or ice cleared regularly. Use sand or salt on walking paths during winter. Clean up any spills in your garage right away. This includes oil or grease spills. What can I do in the bathroom? Use night lights. Install grab bars by the toilet and in the tub and shower. Do not use towel bars as grab bars. Use non-skid mats or decals in the tub or shower. If you  need to sit down in the shower, use a plastic, non-slip stool. Keep the floor dry. Clean up any water that spills on the floor as soon as it happens. Remove soap buildup in the tub or shower regularly. Attach bath mats securely with double-sided non-slip rug tape. Do not have throw rugs and other things on the floor that can make you trip. What can I do in the bedroom? Use night lights. Make sure that you have a light by your bed that is easy to reach. Do not use any sheets or blankets that are too big for your bed. They should not hang down onto the floor. Have a firm chair that has side arms. You can use this for support while you get dressed. Do not have throw rugs and other things on the floor that can make you trip. What can I do in the kitchen? Clean up any spills right away. Avoid walking on wet floors. Keep items that you use a lot in easy-to-reach places. If you need to reach something above you, use a strong step stool that has a  grab bar. Keep electrical cords out of the way. Do not use floor polish or wax that makes floors slippery. If you must use wax, use non-skid floor wax. Do not have throw rugs and other things on the floor that can make you trip. What can I do with my stairs? Do not leave any items on the stairs. Make sure that there are handrails on both sides of the stairs and use them. Fix handrails that are broken or loose. Make sure that handrails are as long as the stairways. Check any carpeting to make sure that it is firmly attached to the stairs. Fix any carpet that is loose or worn. Avoid having throw rugs at the top or bottom of the stairs. If you do have throw rugs, attach them to the floor with carpet tape. Make sure that you have a light switch at the top of the stairs and the bottom of the stairs. If you do not have them, ask someone to add them for you. What else can I do to help prevent falls? Wear shoes that: Do not have high heels. Have rubber  bottoms. Are comfortable and fit you well. Are closed at the toe. Do not wear sandals. If you use a stepladder: Make sure that it is fully opened. Do not climb a closed stepladder. Make sure that both sides of the stepladder are locked into place. Ask someone to hold it for you, if possible. Clearly mark and make sure that you can see: Any grab bars or handrails. First and last steps. Where the edge of each step is. Use tools that help you move around (mobility aids) if they are needed. These include: Canes. Walkers. Scooters. Crutches. Turn on the lights when you go into a dark area. Replace any light bulbs as soon as they burn out. Set up your furniture so you have a clear path. Avoid moving your furniture around. If any of your floors are uneven, fix them. If there are any pets around you, be aware of where they are. Review your medicines with your doctor. Some medicines can make you feel dizzy. This can increase your chance of falling. Ask your doctor what other things that you can do to help prevent falls. This information is not intended to replace advice given to you by your health care provider. Make sure you discuss any questions you have with your health care provider. Document Released: 09/12/2009 Document Revised: 04/23/2016 Document Reviewed: 12/21/2014 Elsevier Interactive Patient Education  2017 [employer].

## 2023-03-29 NOTE — Progress Notes (Signed)
I connected with  Missy Sabins on 03/29/23 by a audio enabled telemedicine application and verified that I am speaking with the correct person using two identifiers.  Patient Location: Home  Provider Location: Office/Clinic  I discussed the limitations of evaluation and management by telemedicine. The patient expressed understanding and agreed to proceed.  Subjective:   ADLAI NIEBLAS is a 72 y.o. female who presents for Medicare Annual (Subsequent) preventive examination.  Review of Systems     Cardiac Risk Factors include: advanced age (>70men, >34 women);hypertension;obesity (BMI >30kg/m2);smoking/ tobacco exposure     Objective:    Today's Vitals   03/29/23 1400  PainSc: 4    There is no height or weight on file to calculate BMI.     03/29/2023    2:06 PM 03/02/2022   11:12 AM 01/15/2022   11:09 AM 02/28/2021   11:25 AM 05/25/2019    2:16 PM 05/24/2019   11:16 AM 11/10/2018   10:50 AM  Advanced Directives  Does Patient Have a Medical Advance Directive? No No No No No No No  Would patient like information on creating a medical advance directive? No - Patient declined No - Patient declined No - Patient declined  No - Patient declined  No - Patient declined    Current Medications (verified) Outpatient Encounter Medications as of 03/29/2023  Medication Sig   acetaminophen (TYLENOL) 325 MG tablet Take 650 mg by mouth every 6 (six) hours as needed for mild pain, moderate pain or headache.    amLODipine (NORVASC) 5 MG tablet Take 1 tablet (5 mg total) by mouth daily.   atenolol (TENORMIN) 100 MG tablet Take 1 tablet (100 mg total) by mouth daily.   atorvastatin (LIPITOR) 40 MG tablet Take 1 tablet (40 mg total) by mouth daily.   Cholecalciferol (VITAMIN D-3 PO) Take 600 Units by mouth daily.   furosemide (LASIX) 40 MG tablet TAKE 1 TABLET BY MOUTH EVERY DAY   Loperamide HCl (IMODIUM PO) Take by mouth. PRN   Multiple Vitamin (MULTIVITAMIN) tablet Take 1 tablet by mouth  daily.   traMADol (ULTRAM) 50 MG tablet TAKE 1 TABLET BY MOUTH EVERY 12 HOURS AS NEEDED   citalopram (CELEXA) 20 MG tablet Take 1 tablet (20 mg total) by mouth daily.   No facility-administered encounter medications on file as of 03/29/2023.    Allergies (verified) Codeine   History: Past Medical History:  Diagnosis Date   Abnormal mammogram, unspecified    Anemia    Aneurysm (HCC) 2004   cerebral x  4, [facility]   Anxiety    Arthritis    Benign neoplasm of breast 2012   Breast complaint 2010   Chronic kidney disease    Renal Cell Cancer   Hypertension    Obesity, unspecified    Personal history of tobacco use, presenting hazards to health    Renal cancer, left (HCC) 11/2016   Renal Cell with Left nephrectomy.   Special screening for malignant neoplasms, colon    Past Surgical History:  Procedure Laterality Date   ABDOMINAL HYSTERECTOMY  1993   BREAST BIOPSY Right 2012   stereo bx showing a 6mm radial scar in an estimated 8 volume of tissue   BREAST BIOPSY Right Apr 09, 2005   Fibrocystic changes, ductal adenosis, microcalcifications, focal stromal/epithelial proliferation, possible fibroadenoma type proliferation   CEREBRAL ANEURYSM REPAIR  2004   COLONOSCOPY  2008   Dr. Maryruth Bun   EYE SURGERY     FOOT SURGERY  Right 2003   LAPAROSCOPIC NEPHRECTOMY, HAND ASSISTED Left 01/01/2017   Procedure: HAND ASSISTED LAPAROSCOPIC NEPHRECTOMY;  Surgeon: Hildred Laser, MD;  Location: [facility] ORS;  Service: Urology;  Laterality: Left;   TONSILLECTOMY  1997   Family History  Problem Relation Age of Onset   Cancer Cousin        maternal first cousin with breast cancer   Cancer Other        colon and ovarian cancers, no relationship listed   Cancer Father        prostate   Hypertension Father    Hypertension Sister    Hypertension Brother    Hypertension Paternal Grandmother    Heart disease Paternal Grandmother    Hypertension Paternal Grandfather    Heart disease  Paternal Grandfather    Breast cancer Neg Hx    Social History   Socioeconomic History   Marital status: Divorced    Spouse name: Not on file   Number of children: Not on file   Years of education: Not on file   Highest education level: GED or equivalent  Occupational History    Comment: full time   Tobacco Use   Smoking status: Every Day    Packs/day: 0.25    Years: 40.00    Additional pack years: 0.00    Total pack years: 10.00    Types: Cigarettes   Smokeless tobacco: Never  Vaping Use   Vaping Use: Never used  Substance and Sexual Activity   Alcohol use: Yes    Comment: socially   Drug use: No   Sexual activity: Yes  Other Topics Concern   Not on file  Social History Narrative   Not on file   Social Determinants of Health   Financial Resource Strain: Low Risk  (03/29/2023)   Overall Financial Resource Strain (CARDIA)    Difficulty of Paying Living Expenses: Not hard at all  Food Insecurity: No Food Insecurity (03/29/2023)   Hunger Vital Sign    Worried About Running Out of Food in the Last Year: Never true    Ran Out of Food in the Last Year: Never true  Recent Concern: Food Insecurity - Food Insecurity Present (01/14/2023)   Hunger Vital Sign    Worried About Running Out of Food in the Last Year: Sometimes true    Ran Out of Food in the Last Year: Never true  Transportation Needs: No Transportation Needs (03/29/2023)   PRAPARE - Administrator, Civil Service (Medical): No    Lack of Transportation (Non-Medical): No  Physical Activity: Insufficiently Active (03/29/2023)   Exercise Vital Sign    Days of Exercise per Week: 2 days    Minutes of Exercise per Session: 20 min  Stress: No Stress Concern Present (03/29/2023)   [employer] of Occupational Health - Occupational Stress Questionnaire    Feeling of Stress : Not at all  Social Connections: Moderately Isolated (03/29/2023)   Social Connection and Isolation Panel [NHANES]    Frequency of  Communication with Friends and Family: More than three times a week    Frequency of Social Gatherings with Friends and Family: More than three times a week    Attends Religious Services: More than 4 times per year    Active Member of [employer] or Organizations: No    Attends Banker Meetings: Never    Marital Status: Divorced    Tobacco Counseling Ready to quit: Not Answered Counseling given: Not Answered   Clinical  Intake:  Pre-visit preparation completed: Yes  Pain : 0-10 Pain Score: 4  Pain Type: Chronic pain Pain Location: Knee     Nutritional Risks: None Diabetes: No  How often do you need to have someone help you when you read instructions, pamphlets, or other written materials from your doctor or pharmacy?: 1 - Never  Diabetic?no  Interpreter Needed?: No  Information entered by :: Kennedy Bucker, LPN   Activities of Daily Living    03/29/2023    2:07 PM  In your present state of health, do you have any difficulty performing the following activities:  Hearing? 0  Vision? 0  Difficulty concentrating or making decisions? 0  Walking or climbing stairs? 1  Comment knees  Dressing or bathing? 0  Doing errands, shopping? 0  Preparing Food and eating ? N  Using the Toilet? N  In the past six months, have you accidently leaked urine? N  Do you have problems with loss of bowel control? N  Managing your Medications? N  Managing your Finances? N  Housekeeping or managing your Housekeeping? N    Patient Care Team: Marjie Skiff, NP as PCP - General (Nurse Practitioner) Hildred Laser, MD (Inactive) as Consulting Physician (Urology) Jeralyn Ruths, MD as Consulting Physician (Oncology)  Indicate any recent Medical Services you may have received from other than [facility] providers in the past year (date may be approximate).     Assessment:   This is a routine wellness examination for Hinda.  Hearing/Vision screen Hearing Screening -  Comments:: No aids Vision Screening - Comments:: Wears glasses- Dr.Bell  Dietary issues and exercise activities discussed: Current Exercise Habits: Home exercise routine, Type of exercise: walking, Time (Minutes): 20, Frequency (Times/Week): 2, Weekly Exercise (Minutes/Week): 40, Intensity: Mild   Goals Addressed             This Visit's Progress    DIET - EAT MORE FRUITS AND VEGETABLES         Depression Screen    03/29/2023    2:04 PM 01/08/2023    3:01 PM 07/08/2022   11:16 AM 03/02/2022   11:02 AM 01/08/2022   10:59 AM 07/09/2021   10:37 AM 02/28/2021   11:26 AM  PHQ 2/9 Scores  PHQ - 2 Score 0 4 0 0 0 0 0  PHQ- 9 Score 0 12 0 2 1 1      Fall Risk    03/29/2023    2:07 PM 01/08/2023    3:00 PM 03/02/2022   11:12 AM 01/08/2022   10:58 AM 02/28/2021   11:25 AM  Fall Risk   Falls in the past year? 0 1 0 0 0  Number falls in past yr: 0 1 0 0   Injury with Fall? 0 0 0 0   Risk for fall due to : No Fall Risks History of fall(s)  No Fall Risks Medication side effect  Follow up Falls prevention discussed;Falls evaluation completed Falls evaluation completed Falls evaluation completed;Education provided;Falls prevention discussed Falls evaluation completed Falls evaluation completed;Education provided;Falls prevention discussed    FALL RISK PREVENTION PERTAINING TO THE HOME:  Any stairs in or around the home? No  If so, are there any without handrails? No  Home free of loose throw rugs in walkways, pet beds, electrical cords, etc? Yes  Adequate lighting in your home to reduce risk of falls? Yes   ASSISTIVE DEVICES UTILIZED TO PREVENT FALLS:  Life alert? No  Use of a cane, walker or  w/c? Yes -cane Grab bars in the bathroom? No  Shower chair or bench in shower? No  Elevated toilet seat or a handicapped toilet? No    Cognitive Function:        03/29/2023    2:13 PM 03/02/2022   10:58 AM 02/28/2021   11:28 AM 05/24/2019   11:16 AM 05/19/2018   11:16 AM  6CIT Screen  What Year? 0  points 0 points 0 points 0 points 0 points  What month? 0 points 0 points 0 points 0 points 0 points  What time? 0 points 0 points 0 points 0 points 0 points  Count back from 20 0 points 0 points 0 points 0 points 0 points  Months in reverse 0 points 0 points 0 points 0 points 0 points  Repeat phrase 0 points 0 points 6 points 0 points 0 points  Total Score 0 points 0 points 6 points 0 points 0 points    Immunizations Immunization History  Administered Date(s) Administered   COVID-19, mRNA, vaccine(Comirnaty)12 years and older 09/13/2022, 02/16/2023   Fluad Quad(high Dose 65+) 09/14/2019, 09/15/2021, 09/13/2022   Influenza, High Dose Seasonal PF 08/30/2017, 08/15/2018, 09/24/2020   Influenza-Unspecified 09/04/2015, 09/18/2016, 08/30/2017   Moderna Sars-Covid-2 Vaccination 02/01/2020, 02/29/2020, 10/12/2020   Pneumococcal Conjugate-13 06/22/2017   Pneumococcal Polysaccharide-23 12/28/2018   Respiratory Syncytial Virus Vaccine,Recomb Aduvanted(Arexvy) 10/14/2022   Td 03/31/2006   Tdap 06/09/2016   Zoster Recombinat (Shingrix) 08/29/2019, 12/29/2019   Zoster, Live 01/24/2013    TDAP status: Up to date  Flu Vaccine status: Up to date  Pneumococcal vaccine status: Up to date  Covid-19 vaccine status: Completed vaccines  Qualifies for Shingles Vaccine? Yes   Zostavax completed Yes   Shingrix Completed?: Yes  Screening Tests Health Maintenance  Topic Date Due   MAMMOGRAM  08/29/2021   COVID-19 Vaccine (6 - 2023-24 season) 04/13/2023   INFLUENZA VACCINE  07/01/2023   Medicare Annual Wellness (AWV)  03/28/2024   COLONOSCOPY (Pts 45-85yrs Insurance coverage will need to be confirmed)  12/24/2025   DTaP/Tdap/Td (3 - Td or Tdap) 06/09/2026   Pneumonia Vaccine 21+ Years old  Completed   DEXA SCAN  Completed   Hepatitis C Screening  Completed   Zoster Vaccines- Shingrix  Completed   HPV VACCINES  Aged Out    Health Maintenance  Health Maintenance Due  Topic Date Due    MAMMOGRAM  08/29/2021    Colorectal cancer screening: Type of screening: Colonoscopy. Completed 12/24/20. Repeat every 5 years  Mammogram status: Ordered 03/29/23. Pt provided with contact info and advised to call to schedule appt.   Bone Density status: Completed 08/30/19. Results reflect: Bone density results: NORMAL. Repeat every 5 years.  Lung Cancer Screening: (Low Dose CT Chest recommended if Age 93-80 years, 30 pack-year currently smoking OR have quit w/in 15years.) does not qualify.    Additional Screening:  Hepatitis C Screening: does qualify; Completed 06/09/16  Vision Screening: Recommended annual ophthalmology exams for early detection of glaucoma and other disorders of the eye. Is the patient up to date with their annual eye exam?  Yes  Who is the provider or what is the name of the office in which the patient attends annual eye exams? Dr.Bell If pt is not established with a provider, would they like to be referred to a provider to establish care? No .   Dental Screening: Recommended annual dental exams for proper oral hygiene  Community Resource Referral / Chronic Care Management: CRR required this visit?  No   CCM required this visit?  No      Plan:     I have personally reviewed and noted the following in the patient's chart:   Medical and social history Use of alcohol, tobacco or illicit drugs  Current medications and supplements including opioid prescriptions. Patient is not currently taking opioid prescriptions. Functional ability and status Nutritional status Physical activity Advanced directives List of other physicians Hospitalizations, surgeries, and ER visits in previous 12 months Vitals Screenings to include cognitive, depression, and falls Referrals and appointments  In addition, I have reviewed and discussed with patient certain preventive protocols, quality metrics, and best practice recommendations. A written personalized care plan for  preventive services as well as general preventive health recommendations were provided to patient.     Hal Hope, LPN   1/61/0960   Nurse Notes: none

## 2023-04-07 DIAGNOSIS — N2581 Secondary hyperparathyroidism of renal origin: Secondary | ICD-10-CM | POA: Diagnosis not present

## 2023-04-07 DIAGNOSIS — I129 Hypertensive chronic kidney disease with stage 1 through stage 4 chronic kidney disease, or unspecified chronic kidney disease: Secondary | ICD-10-CM | POA: Diagnosis not present

## 2023-04-07 DIAGNOSIS — D631 Anemia in chronic kidney disease: Secondary | ICD-10-CM | POA: Diagnosis not present

## 2023-04-07 DIAGNOSIS — Z905 Acquired absence of kidney: Secondary | ICD-10-CM | POA: Diagnosis not present

## 2023-04-07 DIAGNOSIS — N281 Cyst of kidney, acquired: Secondary | ICD-10-CM | POA: Diagnosis not present

## 2023-04-07 DIAGNOSIS — N184 Chronic kidney disease, stage 4 (severe): Secondary | ICD-10-CM | POA: Diagnosis not present

## 2023-04-07 DIAGNOSIS — I10 Essential (primary) hypertension: Secondary | ICD-10-CM | POA: Diagnosis not present

## 2023-05-18 ENCOUNTER — Ambulatory Visit
Admission: RE | Admit: 2023-05-18 | Discharge: 2023-05-18 | Disposition: A | Discharge status: Home / Self Care | Payer: Medicare HMO | Source: Ambulatory Visit | Attending: Nurse Practitioner | Admitting: Nurse Practitioner

## 2023-05-18 DIAGNOSIS — Z1231 Encounter for screening mammogram for malignant neoplasm of breast: Secondary | ICD-10-CM | POA: Diagnosis not present

## 2023-05-21 ENCOUNTER — Other Ambulatory Visit: Payer: Self-pay | Admitting: Nurse Practitioner

## 2023-05-21 DIAGNOSIS — R928 Other abnormal and inconclusive findings on diagnostic imaging of breast: Secondary | ICD-10-CM

## 2023-05-21 DIAGNOSIS — N6489 Other specified disorders of breast: Secondary | ICD-10-CM

## 2023-05-21 NOTE — Progress Notes (Signed)
Contacted via MyChart   Good morning Sarah [name], your mammogram returned and they are in need of further imaging.  Please schedule this: Please call to schedule your mammogram and/or bone density: [facility]  Address: [address], [place], [place] [address] Phone: [phone]  [facility] [address] [place],  [place]  [address] Phone: [phone]

## 2023-05-27 ENCOUNTER — Ambulatory Visit
Admission: RE | Admit: 2023-05-27 | Discharge: 2023-05-27 | Disposition: A | Discharge status: Home / Self Care | Payer: Medicare HMO | Source: Ambulatory Visit | Attending: Nurse Practitioner | Admitting: Nurse Practitioner

## 2023-05-27 DIAGNOSIS — R928 Other abnormal and inconclusive findings on diagnostic imaging of breast: Secondary | ICD-10-CM | POA: Diagnosis not present

## 2023-05-27 DIAGNOSIS — N6489 Other specified disorders of breast: Secondary | ICD-10-CM | POA: Diagnosis not present

## 2023-05-27 DIAGNOSIS — N6001 Solitary cyst of right breast: Secondary | ICD-10-CM | POA: Diagnosis not present

## 2023-05-27 DIAGNOSIS — N6311 Unspecified lump in the right breast, upper outer quadrant: Secondary | ICD-10-CM | POA: Diagnosis not present

## 2023-05-27 DIAGNOSIS — R92321 Mammographic fibroglandular density, right breast: Secondary | ICD-10-CM | POA: Diagnosis not present

## 2023-06-25 ENCOUNTER — Ambulatory Visit: Payer: Medicare HMO | Attending: Urology

## 2023-06-29 ENCOUNTER — Ambulatory Visit: Payer: Medicare HMO | Admitting: Urology

## 2023-06-30 ENCOUNTER — Ambulatory Visit
Admission: RE | Admit: 2023-06-30 | Discharge: 2023-06-30 | Disposition: A | Discharge status: Home / Self Care | Payer: Medicare HMO | Source: Ambulatory Visit | Attending: Urology | Admitting: Urology

## 2023-06-30 DIAGNOSIS — N281 Cyst of kidney, acquired: Secondary | ICD-10-CM | POA: Insufficient documentation

## 2023-06-30 DIAGNOSIS — N2889 Other specified disorders of kidney and ureter: Secondary | ICD-10-CM | POA: Diagnosis not present

## 2023-07-01 NOTE — Patient Instructions (Signed)
Food Basics for Chronic Kidney Disease Chronic kidney disease (CKD) is when your kidneys are not working well. They cannot remove waste, fluids, and other substances from your blood the way they should. These substances can build up, which can worsen kidney damage and affect how your body works. Eating certain foods can lead to a buildup of these substances. Changing your diet can help prevent more kidney damage. Diet changes may also delay dialysis or even keep you from needing it. What nutrients should I limit? Work with your treatment team and a food expert (dietitian) to make a meal plan that's right for you. Foods you can eat and foods you should limit or avoid will depend on the stage of your kidney disease and any other health conditions you have. The items listed below are not a complete list. Talk with your dietitian to learn what is best for you. Potassium Potassium affects how steadily your heart beats. Too much potassium in your blood can cause an irregular heartbeat or even a heart attack. You may need to limit foods that are high in potassium, such as: Liquid milk and soy milk. Salt substitutes that contain potassium. Fruits like bananas, apricots, nectarines, melon, prunes, raisins, kiwi, and oranges. Vegetables, such as potatoes, sweet potatoes, yams, tomatoes, leafy greens, beets, avocado, pumpkin, and winter squash. Beans, like lima beans. Nuts. Phosphorus Phosphorus is a mineral found in your bones. You need a balance between calcium and phosphorus to build and maintain healthy bones. Too much added phosphorus from the foods you eat can pull calcium from your bones. Losing calcium can make your bones weak and more likely to break. Too much phosphorus can also make your skin itch. You may need to limit foods that are high in phosphorus or that have added phosphorus, such as: Liquid milk and dairy products. Dark-colored sodas or soft drinks. Bran cereals and  oatmeal. Protein  Protein helps you make and keep muscle. Protein also helps to repair your body's cells and tissues. One of the natural breakdown products of protein is a waste product called urea. When your kidneys are not working well, they cannot remove types of waste like urea. Reducing protein in your diet can help keep urea from building up in your blood. Depending on your stage of kidney disease, you may need to eat smaller portions of foods that are high in protein. Sources of animal protein include: Meat (all types). Fish and seafood. Poultry. Eggs. Dairy. Other protein foods include: Beans and legumes. Nuts and nut butter. Soy, like tofu.  Sodium Salt (sodium) helps to keep a healthy balance of fluids in your body. Too much salt can increase your blood pressure, which can harm your heart and lungs. Extra salt can also cause your body to keep too much fluid, making your kidneys work harder. You may need to limit or avoid foods that are high in salt, such as: Salt seasonings. Soy and teriyaki sauce. Packaged, precooked, cured, or processed meats, such as sausages or meat loaves. Sardines. Salted crackers and snack foods. Fast food. Canned soups and most canned foods. Pickled foods. Vegetable juice. Boxed mixes or ready-to-eat boxed meals and side dishes. Bottled dressings, sauces, and marinades. Talk with your dietitian about how much potassium, phosphorus, protein, and salt you may have each day. Helpful tips Read food labels  Check the amount of salt in foods. Limit foods that have salt or sodium listed among the first five ingredients. Try to eat low-salt foods. Check the ingredient list  for added phosphorus or potassium. "Phos" in an ingredient is a sign that phosphorus has been added. Do not buy foods that are calcium-enriched or that have calcium added to them (are fortified). Buy canned vegetables and beans that say "no salt added" and rinse them before  eating. Lifestyle Limit the amount of protein you eat from animal sources each day. Focus on protein from plant sources, like tofu and dried beans, peas, and lentils. Do not add salt to food when cooking or before eating. Do not eat star fruit. It can be toxic for people with kidney problems. Talk with your health care provider before taking any vitamin or mineral supplements. If told by your health care provider, track how much liquid you drink so you can avoid drinking too much. You may need to include foods you eat that are made mostly from water, like gelatin, ice cream, soups, and juicy fruits and vegetables. If you have diabetes: If you have diabetes (diabetes mellitus) and CKD, you need to keep your blood sugar (glucose) in the target range recommended by your health care provider. Follow your diabetes management plan. This may include: Checking your blood glucose regularly. Taking medicines by mouth, or taking insulin, or both. Exercising for at least 30 minutes on 5 or more days each week, or as told by your health care provider. Tracking how many servings of carbohydrates you eat at each meal. Not using orange juice to treat low blood sugars. Instead, use apple juice, cranberry juice, or clear soda. You may be given guidelines on what foods and nutrients you may eat, and how much you can have each day. This depends on your stage of kidney disease and whether you have high blood pressure (hypertension). Follow the meal plan your dietitian gives you. To learn more: [employer] of Diabetes and Digestive and Kidney Diseases: [employer]: kidney.org Summary Chronic kidney disease (CKD) is when your kidneys are not working well. They cannot remove waste, fluids, and other substances from your blood the way they should. These substances can build up, which can worsen kidney damage and affect how your body works. Changing your diet can help prevent more  kidney damage. Diet changes may also delay dialysis or even keep you from needing it. Diet changes are different for each person with CKD. Work with a dietitian to set up a meal plan that is right for you. This information is not intended to replace advice given to you by your health care provider. Make sure you discuss any questions you have with your health care provider. Document Revised: 03/05/2022 Document Reviewed: 03/11/2020 Elsevier Patient Education  2024 [employer].

## 2023-07-09 ENCOUNTER — Ambulatory Visit (INDEPENDENT_AMBULATORY_CARE_PROVIDER_SITE_OTHER): Payer: Medicare HMO | Admitting: Nurse Practitioner

## 2023-07-09 ENCOUNTER — Encounter: Payer: Self-pay | Admitting: Nurse Practitioner

## 2023-07-09 VITALS — BP 136/74 | HR 58 | Temp 97.9°F | Ht 66.0 in | Wt 185.2 lb

## 2023-07-09 DIAGNOSIS — N184 Chronic kidney disease, stage 4 (severe): Secondary | ICD-10-CM | POA: Diagnosis not present

## 2023-07-09 DIAGNOSIS — F324 Major depressive disorder, single episode, in partial remission: Secondary | ICD-10-CM

## 2023-07-09 DIAGNOSIS — I272 Pulmonary hypertension, unspecified: Secondary | ICD-10-CM | POA: Diagnosis not present

## 2023-07-09 DIAGNOSIS — I1310 Hypertensive heart and chronic kidney disease without heart failure, with stage 1 through stage 4 chronic kidney disease, or unspecified chronic kidney disease: Secondary | ICD-10-CM

## 2023-07-09 DIAGNOSIS — J432 Centrilobular emphysema: Secondary | ICD-10-CM | POA: Diagnosis not present

## 2023-07-09 DIAGNOSIS — I340 Nonrheumatic mitral (valve) insufficiency: Secondary | ICD-10-CM

## 2023-07-09 DIAGNOSIS — D631 Anemia in chronic kidney disease: Secondary | ICD-10-CM

## 2023-07-09 DIAGNOSIS — C642 Malignant neoplasm of left kidney, except renal pelvis: Secondary | ICD-10-CM | POA: Diagnosis not present

## 2023-07-09 DIAGNOSIS — F17210 Nicotine dependence, cigarettes, uncomplicated: Secondary | ICD-10-CM

## 2023-07-09 DIAGNOSIS — I700 Atherosclerosis of aorta: Secondary | ICD-10-CM | POA: Diagnosis not present

## 2023-07-09 DIAGNOSIS — E6609 Other obesity due to excess calories: Secondary | ICD-10-CM

## 2023-07-09 DIAGNOSIS — E7800 Pure hypercholesterolemia, unspecified: Secondary | ICD-10-CM

## 2023-07-09 NOTE — Assessment & Plan Note (Signed)
Chronic, stable without inhalers at this time.  Recommend complete cessation of smoking.  Spirometry March 2023 noting FEV1 96% and FEV1/FVC 112% -- reviewed with patient at the time.  Continue yearly lung screening + collaboration with cardiology.  New referral to restart lung screening placed.

## 2023-07-09 NOTE — Assessment & Plan Note (Signed)
Followed by Dr. Orlie Dakin in past, has been discharged at this time.  Continue collaboration as needed and review of notes.  Continue to follow with nephrology and urology as scheduled.

## 2023-07-09 NOTE — Assessment & Plan Note (Signed)
Noted possible on past imaging, continue current medication regimen and collaboration with cardiology.

## 2023-07-09 NOTE — Assessment & Plan Note (Signed)
Chronic.  Noted on echo 02/03/22.  Asymptomatic.  Continue current medication regimen and collaboration with cardiology.

## 2023-07-09 NOTE — Assessment & Plan Note (Signed)
I have recommended complete cessation of tobacco use. I have discussed various options available for assistance with tobacco cessation including over the counter methods (Nicotine gum, patch and lozenges). We also discussed prescription options (Chantix, Nicotine Inhaler / Nasal Spray). The patient is not interested in pursuing any prescription tobacco cessation options at this time.  Continue annual lung CA screening -- is due for this, will reach out to team.

## 2023-07-09 NOTE — Assessment & Plan Note (Signed)
Chronic. Noted on lung CT screenings.  Continue statin daily and recommend modest weight loss + focus on healthy diet.  Complete cessation of smoking recommended.

## 2023-07-09 NOTE — Assessment & Plan Note (Signed)
Chronic, ongoing.  Continue current medication regimen and adjust as needed. Lipid panel today. 

## 2023-07-09 NOTE — Assessment & Plan Note (Signed)
Chronic, ongoing. BP at goal for age in office today.  Followed by nephrology. Recommend she continue to check at home and monitor diet, DASH diet. Continue current medication regimen and adjust as needed + collaboration with nephrology.  Recent labs with nephrology obtained and reviewed today.  Return in 6 months.

## 2023-07-09 NOTE — Progress Notes (Signed)
BP 136/74 (BP Location: Left Arm, Patient Position: Sitting, Cuff Size: Normal)   Pulse (!) 58   Temp 97.9 F (36.6 C) (Oral)   Ht 5\' 6"  (1.676 m)   Wt 185 lb 3.2 oz (84 kg)   SpO2 98%   BMI 29.89 kg/m    Subjective:    Patient ID: Sarah [name], female    DOB: [birth year], 72 y.o.   MRN: [id number]  HPI: Sarah [name] is a 72 y.o. female  Chief Complaint  Patient presents with   Chronic Kidney Disease   COPD   Depression   Hyperlipidemia   Hypertension   Renal Cancer   HYPERTENSION / HYPERLIPIDEMIA Continues on Amlodipine, Atenolol, Lasix, Losartan, and Atorvastatin. Sees Dr. Mariah Milling next on 08/10/22, sees annually.  Currently is on waiting list for housing, has worked with [employer] [facility].  Applied at [employer].   Satisfied with current treatment? yes Duration of hypertension: chronic BP monitoring frequency: not checking BP range: BP medication side effects: no Duration of hyperlipidemia: chronic Cholesterol medication side effects: no Cholesterol supplements: none Medication compliance: good compliance Aspirin: no Recent stressors: yes Recurrent headaches: no Visual changes: no Palpitations: no Dyspnea: no Chest pain: no Lower extremity edema: no Dizzy/lightheaded: no  The ASCVD Risk score (Arnett DK, et al., 2019) failed to calculate for the following reasons:   The valid total cholesterol range is 130 to 320 mg/dL  CHRONIC KIDNEY DISEASE STAGE 4 & RENAL CANCER: Follows with nephrology, last visit 04/07/23 - eGFR 16, CRT 3.07, BUN 49, PTH 121, H/H 10.4/31.5.   Followed by urology annually, last saw 06/30/22.  History of left nephrectomy on January 01, 2017.  Required abdominal/pelvic CT scan approximately every 6 months for 3 years after her nephrectomy, last ultrasound imaging was on 06/30/23.  Is discharged from oncology at this time -- last saw February 2023. CKD status: stable Medications renally dose: yes Previous renal evaluation:  yes Pneumovax:  Up to Date Influenza Vaccine:  Up to Date   COPD Continues to smoke cigarettes, but has cut back to only smoking when people get on her nerves -- currently has not had a cigarette in two weeks. Has smoked since her teenage years. Lung CA screening 08/27/21 noting centrilobular emphysema and aortic atherosclerosis + a stable 3 cm adrenal nodule. No current inhalers.   CT lung also noted possible pulmonary arterial hypertension -- she denies palpitations, fatigue, edema. COPD status: stable Satisfied with current treatment?: yes Oxygen use: no Dyspnea frequency: no Cough frequency: no Rescue inhaler frequency: no Limitation of activity: no Productive cough: none Last Spirometry: March 2023 Pneumovax: Up to Date Influenza: Up to Date  DEPRESSION Continues on Celexa 20 MG. Mood status: controlled Satisfied with current treatment?: yes Symptom severity: mild  Duration of current treatment : chronic Side effects: no Medication compliance: excellent compliance Psychotherapy/counseling: none Anxious mood: yes, when people get on her nerves Anhedonia: no Significant weight loss or gain: no Insomnia: no Fatigue: no Feelings of worthlessness or guilt: no Impaired concentration/indecisiveness: no Suicidal ideations: no Hopelessness: no Crying spells: no    07/09/2023   10:35 AM 03/29/2023    2:04 PM 01/08/2023    3:01 PM 07/08/2022   11:16 AM 03/02/2022   11:02 AM  Depression screen PHQ 2/9  Decreased Interest 0 0 2 0 0  Down, Depressed, Hopeless 0 0 2 0 0  PHQ - 2 Score 0 0 4 0 0  Altered sleeping 0 0 2 0 0  Tired, decreased energy 0 0 2 0 2  Change in appetite 0 0 2 0 0  Feeling bad or failure about yourself  0 0 2 0 0  Trouble concentrating 0 0 0 0 0  Moving slowly or fidgety/restless 0 0 0 0 0  Suicidal thoughts 0 0 0 0 0  PHQ-9 Score 0 0 12 0 2  Difficult doing work/chores Not difficult at all Not difficult at all  Not difficult at all        07/09/2023    10:35 AM 01/08/2023    3:01 PM 07/08/2022   11:16 AM 01/08/2022   10:59 AM  GAD 7 : Generalized Anxiety Score  Nervous, Anxious, on Edge 1 1 0 0  Control/stop worrying 1 2 0 0  Worry too much - different things 0 2 0 0  Trouble relaxing 0 1 0 0  Restless 0 0 0 0  Easily annoyed or irritable 0 0 0 0  Afraid - awful might happen 0 1 0 0  Total GAD 7 Score 2 7 0 0  Anxiety Difficulty Not difficult at all   Not difficult at all   Relevant past medical, surgical, family and social history reviewed and updated as indicated. Interim medical history since our last visit reviewed. Allergies and medications reviewed and updated.  Review of Systems  Constitutional:  Negative for activity change, appetite change, diaphoresis, fatigue and fever.  Respiratory:  Negative for cough, chest tightness and shortness of breath.   Cardiovascular:  Negative for chest pain, palpitations and leg swelling.  Gastrointestinal: Negative.   Neurological:  Negative for dizziness, syncope, weakness, light-headedness, numbness and headaches.  Psychiatric/Behavioral: Negative.     Per HPI unless specifically indicated above     Objective:    BP 136/74 (BP Location: Left Arm, Patient Position: Sitting, Cuff Size: Normal)   Pulse (!) 58   Temp 97.9 F (36.6 C) (Oral)   Ht 5\' 6"  (1.676 m)   Wt 185 lb 3.2 oz (84 kg)   SpO2 98%   BMI 29.89 kg/m   Wt Readings from Last 3 Encounters:  07/09/23 185 lb 3.2 oz (84 kg)  03/29/23 197 lb (89.4 kg)  01/08/23 197 lb 11.2 oz (89.7 kg)    Physical Exam Vitals and nursing note reviewed.  Constitutional:      General: She is awake. She is not in acute distress.    Appearance: She is well-developed. She is obese. She is not ill-appearing.  HENT:     Head: Normocephalic.     Right Ear: Hearing normal.     Left Ear: Hearing normal.     Nose: Nose normal.     Mouth/Throat:     Mouth: Mucous membranes are moist.  Eyes:     General: Lids are normal.        Right eye: No  discharge.        Left eye: No discharge.     Conjunctiva/sclera: Conjunctivae normal.     Pupils: Pupils are equal, round, and reactive to light.  Neck:     Vascular: No carotid bruit.  Cardiovascular:     Rate and Rhythm: Normal rate and regular rhythm.     Heart sounds: Murmur heard.     Systolic murmur is present with a grade of 2/6.     No gallop.  Pulmonary:     Effort: Pulmonary effort is normal.     Breath sounds: Normal breath sounds.  Abdominal:  General: Bowel sounds are normal.     Palpations: Abdomen is soft.  Musculoskeletal:     Cervical back: Normal range of motion and neck supple.     Right lower leg: No edema.     Left lower leg: No edema.  Skin:    General: Skin is warm and dry.  Neurological:     Mental Status: She is alert and oriented to person, place, and time.  Psychiatric:        Attention and Perception: Attention normal.        Mood and Affect: Mood normal.        Behavior: Behavior normal. Behavior is cooperative.        Thought Content: Thought content normal.        Judgment: Judgment normal.    Results for orders placed or performed in visit on 07/08/22  Lipid Panel w/o Chol/HDL Ratio  Result Value Ref Range   Cholesterol, Total 129 100 - 199 mg/dL   Triglycerides 52 0 - 149 mg/dL   HDL 49 >93 mg/dL   VLDL Cholesterol Cal 12 5 - 40 mg/dL   LDL Chol Calc (NIH) 68 0 - 99 mg/dL  TSH  Result Value Ref Range   TSH 1.840 0.450 - 4.500 uIU/mL      Assessment & Plan:   Problem List Items Addressed This Visit       Cardiovascular and Mediastinum   Aortic atherosclerosis (HCC)    Chronic. Noted on lung CT screenings.  Continue statin daily and recommend modest weight loss + focus on healthy diet.  Complete cessation of smoking recommended.      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 BP at  goal for age in office today.  Followed by nephrology. Recommend she continue to check at home and monitor diet, DASH diet. Continue current medication regimen and adjust as needed + collaboration with nephrology.  Recent labs with nephrology obtained and reviewed today.  Return in 6 months.        Relevant Medications   losartan (COZAAR) 25 MG tablet   Other Relevant Orders   TSH   Mitral valvular insufficiency    Chronic.  Noted on echo 02/03/22.  Asymptomatic.  Continue current medication regimen and collaboration with cardiology.      Relevant Medications   losartan (COZAAR) 25 MG tablet   Pulmonary hypertension, unspecified (HCC)    Noted possible on past imaging, continue current medication regimen and collaboration with cardiology.      Relevant Medications   losartan (COZAAR) 25 MG tablet     Respiratory   Pulmonary emphysema (HCC) - Primary    Chronic, stable without inhalers at this time.  Recommend complete cessation of smoking.  Spirometry March 2023 noting FEV1 96% and FEV1/FVC 112% -- reviewed with patient at the time.  Continue yearly lung screening + collaboration with cardiology.  New referral to restart lung screening placed.      Relevant Orders   Ambulatory Referral Lung Cancer Screening [facility]     Genitourinary   Anemia in chronic kidney disease    Chronic, ongoing.  Followed by nephrology -- recent labs reviewed and note.      CKD (chronic kidney disease) stage 4, GFR 15-29 ml/min (HCC)    Chronic, ongoing.  Recent labs obtained with nephrology.  Continue current medication  regimen and collaboration with nephrology.   Reviewed recent labs and note.       Renal cancer, left (HCC)    Followed by Dr. Orlie Dakin in past, has been discharged at this time.  Continue collaboration as needed and review of notes.  Continue to follow with nephrology and urology as scheduled.        Other   Depression    Chronic, ongoing.  Continue current medication  regimen and adjust as needed.  Refills sent in as needed.  Denies SI/HI.        Hyperlipidemia    Chronic, ongoing.  Continue current medication regimen and adjust as needed.  Lipid panel today.      Relevant Medications   losartan (COZAAR) 25 MG tablet   Other Relevant Orders   Lipid Panel w/o Chol/HDL Ratio   Nicotine dependence, cigarettes, uncomplicated    I have recommended complete cessation of tobacco use. I have discussed various options available for assistance with tobacco cessation including over the counter methods (Nicotine gum, patch and lozenges). We also discussed prescription options (Chantix, Nicotine Inhaler / Nasal Spray). The patient is not interested in pursuing any prescription tobacco cessation options at this time.  Continue annual lung CA screening -- is due for this, will reach out to team.       Relevant Orders   Ambulatory Referral Lung Cancer Screening [facility]   Obesity    BMI 29.89, some loss present. Recommended eating smaller high protein, low fat meals more frequently and exercising 30 mins a day 5 times a week with a goal of 10-15lb weight loss in the next 3 months. Patient voiced their understanding and motivation to adhere to these recommendations.         Follow up plan: Return in about 6 months (around 01/09/2024) for HTN/HLD, CKD, MOOD.

## 2023-07-09 NOTE — Assessment & Plan Note (Signed)
Chronic, ongoing.  Recent labs obtained with nephrology.  Continue current medication regimen and collaboration with nephrology.   Reviewed recent labs and note.  

## 2023-07-09 NOTE — Assessment & Plan Note (Signed)
Chronic, ongoing.  Followed by nephrology -- recent labs reviewed and note. 

## 2023-07-09 NOTE — Assessment & Plan Note (Signed)
Chronic, ongoing.  Continue current medication regimen and adjust as needed.  Refills sent in as needed.  Denies SI/HI.   

## 2023-07-09 NOTE — Assessment & Plan Note (Signed)
BMI 29.89, some loss present. Recommended eating smaller high protein, low fat meals more frequently and exercising 30 mins a day 5 times a week with a goal of 10-15lb weight loss in the next 3 months. Patient voiced their understanding and motivation to adhere to these recommendations.

## 2023-07-10 NOTE — Progress Notes (Signed)
Contacted via MyChart   Good morning Sarah [name], your labs have returned and overall these remain stable.  No medication changes needed.  Any questions? Keep being amazing!!  Thank you for allowing me to participate in your care.  I appreciate you. Kindest regards, 

## 2023-07-19 DIAGNOSIS — N281 Cyst of kidney, acquired: Secondary | ICD-10-CM | POA: Diagnosis not present

## 2023-07-19 DIAGNOSIS — N2581 Secondary hyperparathyroidism of renal origin: Secondary | ICD-10-CM | POA: Diagnosis not present

## 2023-07-19 DIAGNOSIS — N184 Chronic kidney disease, stage 4 (severe): Secondary | ICD-10-CM | POA: Diagnosis not present

## 2023-07-19 DIAGNOSIS — I129 Hypertensive chronic kidney disease with stage 1 through stage 4 chronic kidney disease, or unspecified chronic kidney disease: Secondary | ICD-10-CM | POA: Diagnosis not present

## 2023-07-19 DIAGNOSIS — I10 Essential (primary) hypertension: Secondary | ICD-10-CM | POA: Diagnosis not present

## 2023-07-19 DIAGNOSIS — D631 Anemia in chronic kidney disease: Secondary | ICD-10-CM | POA: Diagnosis not present

## 2023-07-20 ENCOUNTER — Encounter: Payer: Self-pay | Admitting: Urology

## 2023-07-20 ENCOUNTER — Ambulatory Visit: Payer: Medicare HMO | Admitting: Urology

## 2023-07-20 VITALS — BP 128/77 | HR 69 | Wt 180.8 lb

## 2023-07-20 DIAGNOSIS — R350 Frequency of micturition: Secondary | ICD-10-CM | POA: Diagnosis not present

## 2023-07-20 DIAGNOSIS — Z85528 Personal history of other malignant neoplasm of kidney: Secondary | ICD-10-CM | POA: Diagnosis not present

## 2023-07-20 DIAGNOSIS — N281 Cyst of kidney, acquired: Secondary | ICD-10-CM | POA: Diagnosis not present

## 2023-07-20 NOTE — Progress Notes (Signed)
I,Amy L Pierron,acting as a scribe for Vanna Scotland, MD.,have documented all relevant documentation on the behalf of Vanna Scotland, MD,as directed by  Vanna Scotland, MD while in the presence of Vanna Scotland, MD.  07/20/2023 3:15 PM   Sarah [name] 02-15-1951 [id number]  Referring provider: Marjie Skiff, NP [address] [place],  [place] [address]  Chief Complaint  Patient presents with   RUS    HPI: 72 year-old female presents today for routine annual follow-up with renal ultrasound.  She has a personal history of multiple medical comorbidities including multifocal RCC status post left nephrectomy, multifocal complex right renal cysts, OAB, and stage 3 CKD.  She underwent left laparoscopic hand-assisted nephrectomy on 01/01/2017 by Dr. Hadley Pen.   Surgical pathology c/w multifocal clear cell renal cell carcinoma. 3 distinct tumors were seen. 2 were cystic and the third was solid. There was tumor extension into the renal ssinus. Margins were negative. Lymphovascular invasion was not identified. Histologic grade 2. Greatest dimension was 2.1 cm. Stage pT3a.   She saw medical oncology in 12/2020 CT scan was stable. She had repeat CT on 01/09/2022 that showed no evidence of recurrence or abdominopelvic metastatic disease on this noncontrast examination.Numerous right-sided hypo and hyperdense renal lesions, the largest of which is in the upper pole measuring 4.9 cm previously 4.8 cm.    We have been following her multifocal right renal cysts with serial imaging. Renal ultrasound this year is stable from recent comparison studies. It had mildly increased in size in comparison to June 2019 RUS.  The renal ultrasound on 06/30/2023 indicates a stable hypoechoic lesion of the right kidney. Conglomerate of multiple cysts measuring 6.1 by 4.5 by 4.9. Along with a secondary lesion measuring up to 2.8 centimeters, which is thought to be adrenal in origin seen on MRI in 2017. There's no  evidence of hydronephrosis.  Her most recent creatinine from 07/19/2023 is 3.9, which is up from her previous.   She does not remember being on Vesicare. She doesn't report having any frequency, urgency problems anymore. She is doing well overall with no new symptoms or complaints. She has lost about 52 pounds.   PMH: Past Medical History:  Diagnosis Date   Abnormal mammogram, unspecified    Anemia    Aneurysm (HCC) 2004   cerebral x  4, [facility]   Anxiety    Arthritis    Benign neoplasm of breast 2012   Breast complaint 2010   Chronic kidney disease    Renal Cell Cancer   Hypertension    Obesity, unspecified    Personal history of tobacco use, presenting hazards to health    Renal cancer, left (HCC) 11/2016   Renal Cell with Left nephrectomy.   Special screening for malignant neoplasms, colon     Surgical History: Past Surgical History:  Procedure Laterality Date   ABDOMINAL HYSTERECTOMY  1993   BREAST BIOPSY Right 2012   stereo bx showing a 6mm radial scar in an estimated 8 volume of tissue   BREAST BIOPSY Right Apr 09, 2005   Fibrocystic changes, ductal adenosis, microcalcifications, focal stromal/epithelial proliferation, possible fibroadenoma type proliferation   CEREBRAL ANEURYSM REPAIR  2004   COLONOSCOPY  2008   Dr. Maryruth Bun   EYE SURGERY     FOOT SURGERY Right 2003   LAPAROSCOPIC NEPHRECTOMY, HAND ASSISTED Left 01/01/2017   Procedure: HAND ASSISTED LAPAROSCOPIC NEPHRECTOMY;  Surgeon: Hildred Laser, MD;  Location: [facility] ORS;  Service: Urology;  Laterality:  Left;   TONSILLECTOMY  1997    Home Medications:  Allergies as of 07/20/2023       Reactions   Codeine Itching        Medication List        Accurate as of July 20, 2023  3:15 PM. If you have any questions, ask your nurse or doctor.          acetaminophen 325 MG tablet Commonly known as: TYLENOL Take 650 mg by mouth every 6 (six) hours as needed for mild pain, moderate pain or  headache.   amLODipine 5 MG tablet Commonly known as: NORVASC Take 1 tablet (5 mg total) by mouth daily.   atenolol 100 MG tablet Commonly known as: TENORMIN Take 1 tablet (100 mg total) by mouth daily.   atorvastatin 40 MG tablet Commonly known as: LIPITOR Take 1 tablet (40 mg total) by mouth daily.   calcitRIOL 0.25 MCG capsule Commonly known as: ROCALTROL Take 0.25 mcg by mouth daily.   citalopram 20 MG tablet Commonly known as: CELEXA Take 1 tablet (20 mg total) by mouth daily.   furosemide 40 MG tablet Commonly known as: LASIX TAKE 1 TABLET BY MOUTH EVERY DAY   IMODIUM PO Take by mouth. PRN   losartan 25 MG tablet Commonly known as: COZAAR Take 25 mg by mouth daily.   multivitamin tablet Take 1 tablet by mouth daily.   traMADol 50 MG tablet Commonly known as: ULTRAM TAKE 1 TABLET BY MOUTH EVERY 12 HOURS AS NEEDED   VITAMIN D-3 PO Take 600 Units by mouth daily.        Allergies:  Allergies  Allergen Reactions   Codeine Itching    Family History: Family History  Problem Relation Age of Onset   Cancer Cousin        maternal first cousin with breast cancer   Cancer Other        colon and ovarian cancers, no relationship listed   Cancer Father        prostate   Hypertension Father    Hypertension Sister    Hypertension Brother    Hypertension Paternal Grandmother    Heart disease Paternal Grandmother    Hypertension Paternal Grandfather    Heart disease Paternal Grandfather    Breast cancer Neg Hx     Social History:  reports that she has been smoking cigarettes. She has a 10 pack-year smoking history. She has never used smokeless tobacco. She reports that she does not currently use alcohol. She reports that she does not use drugs.   Physical Exam: BP 128/77   Pulse 69   Wt 180 lb 12.8 oz (82 kg)   BMI 29.18 kg/m   Constitutional:  Alert and oriented, No acute distress. HEENT: [place] AT, moist mucus membranes.  Trachea midline, no  masses. Neurologic: Grossly intact, no focal deficits, moving all 4 extremities. Psychiatric: Normal mood and affect.   Pertinent Imaging: Ultrasound renal complete  Narrative CLINICAL DATA:  Kidney stones.  EXAM: RENAL / URINARY TRACT ULTRASOUND COMPLETE  COMPARISON:  June 23, 2022  FINDINGS: Right Kidney:  Renal measurements: 13.8 x 7.4 x 7 cm = volume: 377.96 mL. Echogenicity within normal limits. No hydronephrosis visualized. Multiple simple cysts identified, largest measures 6.1 x 4.5 x 4.9 cm. No follow-up is recommended. The previously noted hypoechoic lesion superior to the right kidney is unchanged currently measuring 2.7 x 2.8 x 2 4 cm, thought to be adrenal origin. This was seen on prior  MRI of 2017 and described to be an adrenal adenoma. No follow-up is recommended.  Left Kidney:  Surgically removed.  Bladder:  Appears normal for degree of bladder distention.  Other:  None.  IMPRESSION: 1. No acute abnormality identified. 2. The previously noted hypoechoic lesion superior to the right kidney is unchanged, thought to be adrenal origin. This was seen on prior MRI of 2017 and described to be an adrenal adenoma. 3. Status post left nephrectomy.  Electronically Signed By: Sherian Rein M.D. On: 07/01/2023 11:02  Personally reviewed images and agree with radiologic interpretation.    Assessment & Plan:    Acquired renal cyst of right kidney  - Multi-focal renal cyst. Bosniak 1 and 2; stable.  2. History of renal cell carcinoma  - Relatively remote at this point in time, five years without recurrence, no annual continued screening necessary. Plan for two years out.  3. Urinary frequency   - Previously on Vesicare but no longer taking it. Minimal symptoms, not bothersome.    Return in about 2 years (around 07/19/2025).   [facility] [address] [place], [place] [address] [phone]

## 2023-07-26 ENCOUNTER — Ambulatory Visit
Admission: RE | Admit: 2023-07-26 | Discharge: 2023-07-26 | Disposition: A | Discharge status: Home / Self Care | Payer: Medicare HMO | Source: Ambulatory Visit | Attending: Nephrology | Admitting: Nephrology

## 2023-07-26 DIAGNOSIS — N184 Chronic kidney disease, stage 4 (severe): Secondary | ICD-10-CM | POA: Diagnosis not present

## 2023-07-26 DIAGNOSIS — I129 Hypertensive chronic kidney disease with stage 1 through stage 4 chronic kidney disease, or unspecified chronic kidney disease: Secondary | ICD-10-CM | POA: Insufficient documentation

## 2023-07-26 DIAGNOSIS — D631 Anemia in chronic kidney disease: Secondary | ICD-10-CM | POA: Diagnosis not present

## 2023-07-26 MED ORDER — SODIUM CHLORIDE 0.9 % IV SOLN
200.0000 mg | Freq: Once | INTRAVENOUS | Status: AC
  Administered 2023-07-26: 200 mg via INTRAVENOUS
  Filled 2023-07-26: qty 200

## 2023-08-03 ENCOUNTER — Ambulatory Visit
Admission: RE | Admit: 2023-08-03 | Discharge: 2023-08-03 | Disposition: A | Discharge status: Home / Self Care | Payer: Medicare HMO | Source: Ambulatory Visit | Attending: Nephrology | Admitting: Nephrology

## 2023-08-03 DIAGNOSIS — I129 Hypertensive chronic kidney disease with stage 1 through stage 4 chronic kidney disease, or unspecified chronic kidney disease: Secondary | ICD-10-CM | POA: Diagnosis not present

## 2023-08-03 DIAGNOSIS — N184 Chronic kidney disease, stage 4 (severe): Secondary | ICD-10-CM | POA: Diagnosis not present

## 2023-08-03 DIAGNOSIS — D631 Anemia in chronic kidney disease: Secondary | ICD-10-CM | POA: Insufficient documentation

## 2023-08-03 MED ORDER — SODIUM CHLORIDE 0.9 % IV SOLN
200.0000 mg | Freq: Once | INTRAVENOUS | Status: AC
  Administered 2023-08-03: 200 mg via INTRAVENOUS
  Filled 2023-08-03: qty 200

## 2023-08-10 ENCOUNTER — Ambulatory Visit
Admission: RE | Admit: 2023-08-10 | Discharge: 2023-08-10 | Disposition: A | Discharge status: Home / Self Care | Payer: Medicare HMO | Source: Ambulatory Visit | Attending: Nephrology | Admitting: Nephrology

## 2023-08-10 VITALS — BP 157/57 | HR 64 | Temp 97.8°F | Resp 17 | Ht 67.0 in | Wt 183.0 lb

## 2023-08-10 DIAGNOSIS — N1832 Chronic kidney disease, stage 3b: Secondary | ICD-10-CM | POA: Insufficient documentation

## 2023-08-10 DIAGNOSIS — D631 Anemia in chronic kidney disease: Secondary | ICD-10-CM | POA: Insufficient documentation

## 2023-08-10 MED ORDER — SODIUM CHLORIDE 0.9 % IV SOLN
200.0000 mg | Freq: Once | INTRAVENOUS | Status: AC
  Administered 2023-08-10: 200 mg via INTRAVENOUS
  Filled 2023-08-10: qty 200

## 2023-09-14 ENCOUNTER — Ambulatory Visit: Admission: RE | Admit: 2023-09-14 | Payer: Medicare HMO | Source: Ambulatory Visit

## 2023-09-15 ENCOUNTER — Ambulatory Visit
Admission: RE | Admit: 2023-09-15 | Discharge: 2023-09-15 | Disposition: A | Discharge status: Home / Self Care | Payer: Medicare HMO | Source: Ambulatory Visit | Attending: Nephrology | Admitting: Nephrology

## 2023-09-15 DIAGNOSIS — Z01818 Encounter for other preprocedural examination: Secondary | ICD-10-CM | POA: Diagnosis not present

## 2023-09-15 DIAGNOSIS — D631 Anemia in chronic kidney disease: Secondary | ICD-10-CM | POA: Diagnosis not present

## 2023-09-15 DIAGNOSIS — N1832 Chronic kidney disease, stage 3b: Secondary | ICD-10-CM | POA: Insufficient documentation

## 2023-09-15 LAB — CBC
HCT: 31.1 % — ABNORMAL LOW (ref 36.0–46.0)
Hemoglobin: 10.5 g/dL — ABNORMAL LOW (ref 12.0–15.0)
MCH: 29.8 pg (ref 26.0–34.0)
MCHC: 33.8 g/dL (ref 30.0–36.0)
MCV: 88.4 fL (ref 80.0–100.0)
Platelets: 295 K/uL (ref 150–400)
RBC: 3.52 MIL/uL — ABNORMAL LOW (ref 3.87–5.11)
RDW: 16.1 % — ABNORMAL HIGH (ref 11.5–15.5)
WBC: 7.7 K/uL (ref 4.0–10.5)
nRBC: 0 % (ref 0.0–0.2)

## 2023-09-15 LAB — IRON AND TIBC
Iron: 78 ug/dL (ref 28–170)
Saturation Ratios: 27 % (ref 10.4–31.8)
TIBC: 284 ug/dL (ref 250–450)
UIBC: 206 ug/dL

## 2023-09-15 LAB — FERRITIN: Ferritin: 386 ng/mL — ABNORMAL HIGH (ref 11–307)

## 2023-09-16 ENCOUNTER — Telehealth: Payer: Self-pay | Admitting: Nurse Practitioner

## 2023-09-16 NOTE — Telephone Encounter (Signed)
Pt is calling to report that she has an application at [facility] on [facility].  Needing a letter to [employer] stating that she is eligible for senior housing because she is age 72.  [phone]

## 2023-09-17 ENCOUNTER — Encounter: Payer: Self-pay | Admitting: Nurse Practitioner

## 2023-09-17 NOTE — Telephone Encounter (Signed)
Routing to provider  

## 2023-09-30 ENCOUNTER — Telehealth: Payer: Self-pay | Admitting: Nurse Practitioner

## 2023-09-30 NOTE — Telephone Encounter (Signed)
Copied from CRM [id number]. Topic: General - Other >> Sep 30, 2023  1:13 PM Santiya F wrote: Reason for CRM: Pt is calling in because she is about to move to a senior living apartment and needs to speak with someone in the office regarding getting a letter. Pt says this is extremely important and she needs someone to call her as soon as possible. Please follow up with pt.

## 2023-10-01 ENCOUNTER — Encounter: Payer: Self-pay | Admitting: Nurse Practitioner

## 2023-10-01 NOTE — Telephone Encounter (Signed)
More current message has bee be routed to provier

## 2023-10-01 NOTE — Telephone Encounter (Signed)
Pt is calling to calling to ask if the letter for senior housing can be amended to include that patient has knee pain and has difficulty walking. Patient is upset and would like the letter to be updated today. Because she has been on the waiting list and is afraid that she will loose her place on the waiting list for housing. Please advise when the letter is ready and available for pickup. [phone]

## 2023-10-01 NOTE — Telephone Encounter (Signed)
Picked up by patient

## 2023-10-05 ENCOUNTER — Telehealth: Payer: Self-pay | Admitting: Nurse Practitioner

## 2023-10-05 NOTE — Telephone Encounter (Addendum)
Sarah [name]., housing [facility] with [employer] will be faxing over a reason of accomodation form today for patient and form needs to be faxed back to # [phone]. After hanging up, I realize I accidentally had gave Sarah [name] the wrong fax number, tried reaching back out to her to fix this & no answer. Left her a message with the correct fax number.

## 2023-10-06 NOTE — Telephone Encounter (Signed)
Copied from CRM [id number]. Topic: General - Other >> Oct 06, 2023 10:10 AM Phill Myron wrote: [name] is going to refax the housing letter. She stated make sure you put on the paper " Resident needs senior housing and handicap accommodations" hud requires detailed information.

## 2023-10-06 NOTE — Telephone Encounter (Signed)
Forms placed in providers folder for completion and signature.

## 2023-10-07 NOTE — Telephone Encounter (Addendum)
Pt following up on paperwork for housing.  Pt states she is moving on 11/15 and needs this sent in asap.   Any questions call Ms [phone]  ext [phone] Please call the pt when this is completed.

## 2023-10-08 NOTE — Telephone Encounter (Signed)
Helmut Muster from [facility] [facility] called in, states she doesn't have a specific locaiton for pt. She just needs an apt for Handicaps because of her legs. She says needs form faxed asap at [phone]

## 2023-10-08 NOTE — Telephone Encounter (Signed)
Paperwork has been completed and faxed to [phone] and  fax had been confirmed.

## 2023-10-08 NOTE — Telephone Encounter (Signed)
Noted  

## 2023-10-21 ENCOUNTER — Telehealth: Payer: Self-pay | Admitting: Nurse Practitioner

## 2023-10-21 NOTE — Telephone Encounter (Signed)
Copied from CRM [id number]. Topic: General - Other >> Oct 20, 2023  4:21 PM Santiya F wrote: Reason for CRM: Pt is calling in because she said a referral was put in for a CT scan and pt wants to know if it can be pushed back to December because she's in the process of moving and just had a death in the family.

## 2023-12-09 DIAGNOSIS — N2581 Secondary hyperparathyroidism of renal origin: Secondary | ICD-10-CM | POA: Diagnosis not present

## 2023-12-09 DIAGNOSIS — D631 Anemia in chronic kidney disease: Secondary | ICD-10-CM | POA: Diagnosis not present

## 2023-12-09 DIAGNOSIS — I129 Hypertensive chronic kidney disease with stage 1 through stage 4 chronic kidney disease, or unspecified chronic kidney disease: Secondary | ICD-10-CM | POA: Diagnosis not present

## 2023-12-09 DIAGNOSIS — Z905 Acquired absence of kidney: Secondary | ICD-10-CM | POA: Diagnosis not present

## 2023-12-09 DIAGNOSIS — N184 Chronic kidney disease, stage 4 (severe): Secondary | ICD-10-CM | POA: Diagnosis not present

## 2023-12-09 DIAGNOSIS — I10 Essential (primary) hypertension: Secondary | ICD-10-CM | POA: Diagnosis not present

## 2023-12-24 ENCOUNTER — Other Ambulatory Visit: Payer: Self-pay | Admitting: Nurse Practitioner

## 2023-12-24 NOTE — Telephone Encounter (Signed)
Rx sent 01/08/23 #90/4 RF. Pt should have 1 refill left if pharmacy accepts after 1 year mark. Pt should still have enough medication to last upcoming appt.   Requested Prescriptions  Pending Prescriptions Disp Refills   atenolol (TENORMIN) 100 MG tablet 90 tablet 4    Sig: Take 1 tablet (100 mg total) by mouth daily.     Cardiovascular: Beta Blockers 2 Failed - 12/24/2023  5:16 PM      Failed - Cr in normal range and within 360 days    Creatinine, Ser  Date Value Ref Range Status  03/23/2022 2.16 (H) 0.57 - 1.00 mg/dL Final         Failed - Last BP in normal range    BP Readings from Last 1 Encounters:  07/20/23 128/77         Passed - Last Heart Rate in normal range    Pulse Readings from Last 1 Encounters:  07/20/23 69         Passed - Valid encounter within last 6 months    Recent Outpatient Visits           5 months ago Centrilobular emphysema (HCC)   [place] [facility] [place], Christopher T, NP   11 months ago Renal cancer, left (HCC)   [place] [facility] [place], [place] T, NP   1 year ago 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

## 2023-12-24 NOTE — Telephone Encounter (Signed)
Pt should have a refill available  - need to call pharmacy.

## 2023-12-24 NOTE — Telephone Encounter (Signed)
Medication Refill -  Most Recent Primary Care Visit:  Provider: Aura Dials T  Department: [facility]  Visit Type: OFFICE VISIT  Date: 07/09/2023  Medication: atenolol (TENORMIN) 100 MG tablet   Has the patient contacted their pharmacy? Yes   Is this the correct pharmacy for this prescription? Yes  This is the patient's preferred pharmacy:  [id number] - [place], [place] - [address] [place] [address] Phone: [phone] Fax: [phone]   Has the prescription been filled recently? Yes  Is the patient out of the medication? Yes  Has the patient been seen for an appointment in the last year OR does the patient have an upcoming appointment? Yes  Can we respond through MyChart? No  Agent: Please be advised that Rx refills may take up to 3 business days. We ask that you follow-up with your pharmacy.

## 2023-12-24 NOTE — Telephone Encounter (Signed)
[facility] called due to long hold time received pharmacist prescribing voicemail. Did not LVM at this time.

## 2024-01-08 NOTE — Patient Instructions (Signed)
 Managing Depression, Adult Depression is a mental health condition that affects your thoughts, feelings, and actions. Being diagnosed with depression can bring you relief if you did not know why you have felt or behaved a certain way. It could also leave you feeling overwhelmed. Finding ways to manage your symptoms can help you feel more positive about your future. How to manage lifestyle changes Being depressed is difficult. Depression can increase the level of everyday stress. Stress can make depression symptoms worse. You may believe your symptoms cannot be managed or will never improve. However, there are many things you can try to help manage your symptoms. There is hope. Managing stress  Stress is your body's reaction to life changes and events, both good and bad. Stress can add to your feelings of depression. Learning to manage your stress can help lessen your feelings of depression. Try some of the following approaches to reducing your stress (stress reduction techniques): Listen to music that you enjoy and that inspires you. Try using a meditation app or take a meditation class. Develop a practice that helps you connect with your spiritual self. Walk in nature, pray, or go to a place of worship. Practice deep breathing. To do this, inhale slowly through your nose. Pause at the top of your inhale for a few seconds and then exhale slowly, letting yourself relax. Repeat this three or four times. Practice yoga to help relax and work your muscles. Choose a stress reduction technique that works for you. These techniques take time and practice to develop. Set aside 5-15 minutes a day to do them. Therapists can offer training in these techniques. Do these things to help manage stress: Keep a journal. Know your limits. Set healthy boundaries for yourself and others, such as saying "no" when you think something is too much. Pay attention to how you react to certain situations. You may not be able to  control everything, but you can change your reaction. Add humor to your life by watching funny movies or shows. Make time for activities that you enjoy and that relax you. Spend less time using electronics, especially at night before bed. The light from screens can make your brain think it is time to get up rather than go to bed.  Medicines Medicines, such as antidepressants, are often a part of treatment for depression. Talk with your pharmacist or health care provider about all the medicines, supplements, and herbal products that you take, their possible side effects, and what medicines and other products are safe to take together. Make sure to report any side effects you may have to your health care provider. Relationships Your health care provider may suggest family therapy, couples therapy, or individual therapy as part of your treatment. How to recognize changes Everyone responds differently to treatment for depression. As you recover from depression, you may start to: Have more interest in doing activities. Feel more hopeful. Have more energy. Eat a more regular amount of food. Have better mental focus. It is important to recognize if your depression is not getting better or is getting worse. The symptoms you had in the beginning may return, such as: Feeling tired. Eating too much or too little. Sleeping too much or too little. Feeling restless, agitated, or hopeless. Trouble focusing or making decisions. Having unexplained aches and pains. Feeling irritable, angry, or aggressive. If you or your family members notice these symptoms coming back, let your health care provider know right away. Follow these instructions at home: Activity Try to  get some form of exercise each day, such as walking. Try yoga, mindfulness, or other stress reduction techniques. Participate in group activities if you are able. Lifestyle Get enough sleep. Cut down on or stop using caffeine, tobacco,  alcohol, and any other harmful substances. Eat a healthy diet that includes plenty of vegetables, fruits, whole grains, low-fat dairy products, and lean protein. Limit foods that are high in solid fats, added sugar, or salt (sodium). General instructions Take over-the-counter and prescription medicines only as told by your health care provider. Keep all follow-up visits. It is important for your health care provider to check on your mood, behavior, and medicines. Your health care provider may need to make changes to your treatment. Where to find support Talking to others  Friends and family members can be sources of support and guidance. Talk to trusted friends or family members about your condition. Explain your symptoms and let them know that you are working with a health care provider to treat your depression. Tell friends and family how they can help. Finances Find mental health providers that fit with your financial situation. Talk with your health care provider if you are worried about access to food, housing, or medicine. Call your insurance company to learn about your co-pays and prescription plan. Where to find more information You can find support in your area from: Anxiety and Depression Association of America (ADAA): adaa.org Mental Health America: mentalhealthamerica.net The [employer] on Mental Illness: nami.org Contact a health care provider if: You stop taking your antidepressant medicines, and you have any of these symptoms: Nausea. Headache. Light-headedness. Chills and body aches. Not being able to sleep (insomnia). You or your friends and family think your depression is getting worse. Get help right away if: You have thoughts of hurting yourself or others. Get help right away if you feel like you may hurt yourself or others, or have thoughts about taking your own life. Go to your nearest emergency room or: Call 911. Call the National Suicide Prevention Lifeline at  360-042-0264 or 988. This is open 24 hours a day. Text the Crisis Text Line at 952-302-5572. This information is not intended to replace advice given to you by your health care provider. Make sure you discuss any questions you have with your health care provider. Document Revised: 03/24/2022 Document Reviewed: 03/24/2022 Elsevier Patient Education  2024 [employer].

## 2024-01-09 ENCOUNTER — Other Ambulatory Visit: Payer: Self-pay | Admitting: Nurse Practitioner

## 2024-01-10 ENCOUNTER — Ambulatory Visit: Payer: Self-pay | Admitting: Nurse Practitioner

## 2024-01-10 ENCOUNTER — Encounter: Payer: Self-pay | Admitting: Nurse Practitioner

## 2024-01-10 ENCOUNTER — Other Ambulatory Visit: Payer: Self-pay

## 2024-01-10 VITALS — BP 132/78 | HR 73 | Temp 97.7°F | Ht 66.0 in | Wt 187.4 lb

## 2024-01-10 DIAGNOSIS — E7800 Pure hypercholesterolemia, unspecified: Secondary | ICD-10-CM | POA: Diagnosis not present

## 2024-01-10 DIAGNOSIS — Z6831 Body mass index (BMI) 31.0-31.9, adult: Secondary | ICD-10-CM

## 2024-01-10 DIAGNOSIS — E66811 Obesity, class 1: Secondary | ICD-10-CM

## 2024-01-10 DIAGNOSIS — J432 Centrilobular emphysema: Secondary | ICD-10-CM

## 2024-01-10 DIAGNOSIS — F17210 Nicotine dependence, cigarettes, uncomplicated: Secondary | ICD-10-CM

## 2024-01-10 DIAGNOSIS — G8929 Other chronic pain: Secondary | ICD-10-CM

## 2024-01-10 DIAGNOSIS — I1310 Hypertensive heart and chronic kidney disease without heart failure, with stage 1 through stage 4 chronic kidney disease, or unspecified chronic kidney disease: Secondary | ICD-10-CM

## 2024-01-10 DIAGNOSIS — N184 Chronic kidney disease, stage 4 (severe): Secondary | ICD-10-CM | POA: Diagnosis not present

## 2024-01-10 DIAGNOSIS — I700 Atherosclerosis of aorta: Secondary | ICD-10-CM | POA: Diagnosis not present

## 2024-01-10 DIAGNOSIS — M25561 Pain in right knee: Secondary | ICD-10-CM

## 2024-01-10 DIAGNOSIS — Z87891 Personal history of nicotine dependence: Secondary | ICD-10-CM | POA: Diagnosis not present

## 2024-01-10 DIAGNOSIS — F324 Major depressive disorder, single episode, in partial remission: Secondary | ICD-10-CM

## 2024-01-10 DIAGNOSIS — Z23 Encounter for immunization: Secondary | ICD-10-CM | POA: Diagnosis not present

## 2024-01-10 DIAGNOSIS — C642 Malignant neoplasm of left kidney, except renal pelvis: Secondary | ICD-10-CM

## 2024-01-10 DIAGNOSIS — I272 Pulmonary hypertension, unspecified: Secondary | ICD-10-CM | POA: Diagnosis not present

## 2024-01-10 DIAGNOSIS — D631 Anemia in chronic kidney disease: Secondary | ICD-10-CM

## 2024-01-10 DIAGNOSIS — Z122 Encounter for screening for malignant neoplasm of respiratory organs: Secondary | ICD-10-CM

## 2024-01-10 NOTE — Assessment & Plan Note (Signed)
Chronic, ongoing.  Continue current medication regimen and adjust as needed.  Refills sent in as needed.  Denies SI/HI.   

## 2024-01-10 NOTE — Assessment & Plan Note (Signed)
 Chronic, ongoing. BP at goal for age in office today on recheck, has not taken medication this morning.  Followed by nephrology. Recommend she continue to check at home and monitor diet, DASH diet. Continue current medication regimen and adjust as needed + collaboration with nephrology.  Recent labs with nephrology obtained and reviewed today.  Return in 6 months.

## 2024-01-10 NOTE — Assessment & Plan Note (Signed)
Chronic, ongoing.  Followed by nephrology -- recent labs reviewed and note. 

## 2024-01-10 NOTE — Assessment & Plan Note (Signed)
BMI 30.25. Recommended eating smaller high protein, low fat meals more frequently and exercising 30 mins a day 5 times a week with a goal of 10-15lb weight loss in the next 3 months. Patient voiced their understanding and motivation to adhere to these recommendations.

## 2024-01-10 NOTE — Assessment & Plan Note (Signed)
 Quit smoking November 2024.  Praised for this and recommend continued cessation.  She would like to continue lung screening, new referral placed.

## 2024-01-10 NOTE — Assessment & Plan Note (Signed)
Chronic, ongoing.  Recent labs obtained with nephrology.  Continue current medication regimen and collaboration with nephrology.   Reviewed recent labs and note.  

## 2024-01-10 NOTE — Progress Notes (Signed)
 BP 132/78 (BP Location: Left Arm, Patient Position: Sitting, Cuff Size: Large)   Pulse 73   Temp 97.7 F (36.5 C) (Oral)   Ht 5\' 6"  (1.676 m)   Wt 187 lb 6.4 oz (85 kg)   SpO2 97%   BMI 30.25 kg/m    Subjective:    Patient ID: Sarah [name], female    DOB: [birth year], 73 y.o.   MRN: [id number]  HPI: Sarah [name] is a 73 y.o. female  Chief Complaint  Patient presents with   Chronic Kidney Disease   Depression   Hyperlipidemia   Hypertension   Continues to have ongoing knee pain, would like to know if there is something else to take for pain.  R>L knee pain.  Had steroid injection one year ago at [facility], but this offered no benefit.  Tramadol  makes her loopy.    HYPERTENSION / HYPERLIPIDEMIA Taking Amlodipine , Atenolol , Lasix , Losartan , and Atorvastatin . Follows with cardiology, Dr. Gollan, with last visit 08/10/22. Satisfied with current treatment? yes Duration of hypertension: chronic BP monitoring frequency: not checking BP range: BP medication side effects: no Duration of hyperlipidemia: chronic Cholesterol medication side effects: no Cholesterol supplements: none Medication compliance: good compliance Aspirin: no Recent stressors: yes Recurrent headaches: no Visual changes: no Palpitations: no Dyspnea: no Chest pain: no Lower extremity edema: no Dizzy/lightheaded: no  The 10-year ASCVD risk score (Arnett DK, et al., 2019) is: 20.3%   Values used to calculate the score:     Age: 74 years     Sex: Female     Is Non-Hispanic African American: Yes     Diabetic: No     Tobacco smoker: Yes     Systolic Blood Pressure: 132 mmHg     Is BP treated: Yes     HDL Cholesterol: 49 mg/dL     Total Cholesterol: 160 mg/dL  CHRONIC KIDNEY DISEASE STAGE 4 & RENAL CANCER: Last saw nephrology on 12/09/23 - eGFR 20, CRT 2.55, BUN 48, PTH 138, H/H 9.3/28.4.  Follows with urology annually, last saw 07/20/23.  Has history of left nephrectomy on January 01, 2017.  Had  abdominal/pelvic CT scan approximately every 6 months for 3 years after her nephrectomy, last imaging was on 06/30/23.  Discharged from oncology with last visit 01/15/22. CKD status: stable Medications renally dose: yes Previous renal evaluation: yes Pneumovax:  Up to Date Influenza Vaccine:  Up to Date   COPD Quit smoking in November 2024, as no smoking allowed in her new apartment.  Does not even crave it anymore.  Had smoked since age 88. Lung CA screening 08/27/21 noting centrilobular emphysema and aortic atherosclerosis + a stable 3 cm adrenal nodule. No current inhalers. CT lung also noted possible pulmonary arterial hypertension -- she denies palpitations, fatigue, edema. COPD status: stable Satisfied with current treatment?: yes Oxygen use: no Dyspnea frequency: no Cough frequency: no Rescue inhaler frequency: no Limitation of activity: no Productive cough: none Last Spirometry: March 2023 Pneumovax: Up to Date Influenza: Up to Date  DEPRESSION Taking Celexa  20 MG. Mood status: controlled Satisfied with current treatment?: yes Symptom severity: mild  Duration of current treatment : chronic Side effects: no Medication compliance: excellent compliance Psychotherapy/counseling: none Anxious mood: no Anhedonia: no Significant weight loss or gain: no Insomnia: no Fatigue: no Feelings of worthlessness or guilt: no Impaired concentration/indecisiveness: no Suicidal ideations: no Hopelessness: no Crying spells: no    01/10/2024   11:46 AM 07/09/2023   10:35 AM 03/29/2023  2:04 PM 01/08/2023    3:01 PM 07/08/2022   11:16 AM  Depression screen PHQ 2/9  Decreased Interest 0 0 0 2 0  Down, Depressed, Hopeless 0 0 0 2 0  PHQ - 2 Score 0 0 0 4 0  Altered sleeping 0 0 0 2 0  Tired, decreased energy 0 0 0 2 0  Change in appetite 0 0 0 2 0  Feeling bad or failure about yourself  0 0 0 2 0  Trouble concentrating 0 0 0 0 0  Moving slowly or fidgety/restless 0 0 0 0 0  Suicidal  thoughts 0 0 0 0 0  PHQ-9 Score 0 0 0 12 0  Difficult doing work/chores  Not difficult at all Not difficult at all  Not difficult at all       01/10/2024   11:47 AM 07/09/2023   10:35 AM 01/08/2023    3:01 PM 07/08/2022   11:16 AM  GAD 7 : Generalized Anxiety Score  Nervous, Anxious, on Edge 0 1 1 0  Control/stop worrying 0 1 2 0  Worry too much - different things 0 0 2 0  Trouble relaxing 0 0 1 0  Restless 0 0 0 0  Easily annoyed or irritable 0 0 0 0  Afraid - awful might happen 0 0 1 0  Total GAD 7 Score 0 2 7 0  Anxiety Difficulty Not difficult at all Not difficult at all     Relevant past medical, surgical, family and social history reviewed and updated as indicated. Interim medical history since our last visit reviewed. Allergies and medications reviewed and updated.  Review of Systems  Constitutional:  Negative for activity change, appetite change, diaphoresis, fatigue and fever.  Respiratory:  Negative for cough, chest tightness and shortness of breath.   Cardiovascular:  Negative for chest pain, palpitations and leg swelling.  Gastrointestinal: Negative.   Neurological:  Negative for dizziness, syncope, weakness, light-headedness, numbness and headaches.  Psychiatric/Behavioral: Negative.     Per HPI unless specifically indicated above     Objective:    BP 132/78 (BP Location: Left Arm, Patient Position: Sitting, Cuff Size: Large)   Pulse 73   Temp 97.7 F (36.5 C) (Oral)   Ht 5\' 6"  (1.676 m)   Wt 187 lb 6.4 oz (85 kg)   SpO2 97%   BMI 30.25 kg/m   Wt Readings from Last 3 Encounters:  01/10/24 187 lb 6.4 oz (85 kg)  07/20/23 180 lb 12.8 oz (82 kg)  07/09/23 185 lb 3.2 oz (84 kg)    Physical Exam Vitals and nursing note reviewed.  Constitutional:      General: She is awake. She is not in acute distress.    Appearance: She is well-developed. She is obese. She is not ill-appearing.  HENT:     Head: Normocephalic.     Right Ear: Hearing normal.     Left Ear:  Hearing normal.     Nose: Nose normal.     Mouth/Throat:     Mouth: Mucous membranes are moist.  Eyes:     General: Lids are normal.        Right eye: No discharge.        Left eye: No discharge.     Conjunctiva/sclera: Conjunctivae normal.     Pupils: Pupils are equal, round, and reactive to light.  Neck:     Vascular: No carotid bruit.  Cardiovascular:     Rate and Rhythm: Normal rate  and regular rhythm.     Heart sounds: Murmur heard.     Systolic murmur is present with a grade of 2/6.     No gallop.  Pulmonary:     Effort: Pulmonary effort is normal.     Breath sounds: Normal breath sounds.  Abdominal:     General: Bowel sounds are normal.     Palpations: Abdomen is soft.  Musculoskeletal:     Cervical back: Normal range of motion and neck supple.     Right lower leg: No edema.     Left lower leg: No edema.  Skin:    General: Skin is warm and dry.  Neurological:     Mental Status: She is alert and oriented to person, place, and time.  Psychiatric:        Attention and Perception: Attention normal.        Mood and Affect: Mood normal.        Behavior: Behavior normal. Behavior is cooperative.        Thought Content: Thought content normal.        Judgment: Judgment normal.    Results for orders placed or performed in visit on 07/09/23  Lipid Panel w/o Chol/HDL Ratio   Collection Time: 07/09/23 11:10 AM  Result Value Ref Range   Cholesterol, Total 160 100 - 199 mg/dL   Triglycerides 67 0 - 149 mg/dL   HDL 49 >16 mg/dL   VLDL Cholesterol Cal 13 5 - 40 mg/dL   LDL Chol Calc (NIH) 98 0 - 99 mg/dL  TSH   Collection Time: 07/09/23 11:10 AM  Result Value Ref Range   TSH 2.140 0.450 - 4.500 uIU/mL      Assessment & Plan:   Problem List Items Addressed This Visit       Cardiovascular and Mediastinum   Aortic atherosclerosis (HCC)   Chronic. Noted on lung CT screenings.  Continue statin daily and recommend modest weight loss + focus on healthy diet.  Complete  cessation of smoking recommended.      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 chronic kidney disease stage IV (HCC) - Primary   Chronic, ongoing. BP at goal for age in office today on recheck, has not taken medication this morning.  Followed by nephrology. Recommend she continue to check at home and monitor diet, DASH diet. Continue current medication regimen and adjust as needed + collaboration with nephrology.  Recent labs with nephrology obtained and reviewed today.  Return in 6 months.        Pulmonary hypertension, unspecified (HCC)   Noted possible on past imaging, continue current medication regimen and collaboration with cardiology.        Respiratory   Pulmonary emphysema (HCC)   Chronic, stable without inhalers at this time.  Recommend complete cessation of smoking.  Spirometry March 2023 noting FEV1 96% and FEV1/FVC 112% -- reviewed with patient at the time.  Continue yearly lung screening + collaboration with cardiology.  New referral to restart lung screening placed.  Plan on repeat spirometry next visit.        Genitourinary   Anemia in chronic kidney disease   Chronic, ongoing.  Followed by nephrology -- recent labs reviewed and note.      CKD (chronic kidney disease) stage 4, GFR 15-29 ml/min (HCC)   Chronic, ongoing.  Recent labs obtained with nephrology.  Continue current medication regimen and collaboration with nephrology.   Reviewed recent labs and note.  Renal cancer, left (HCC)   Followed by Dr. Adrian Alba in past, has been discharged at this time.  Continue collaboration as needed and review of notes.  Continue to follow with nephrology and urology as scheduled.        Other   Chronic pain of right knee   Chronic, ongoing and worsening.  Tramadol  makes her too loopy and no benefit from OTC medications.  No benefit from steroid injection last year.  Will place referral to [facility] ortho per request for further  recommendations.      Relevant Orders   Ambulatory referral to Orthopedic Surgery   Depression   Chronic, ongoing.  Continue current medication regimen and adjust as needed.  Refills sent in as needed.  Denies SI/HI.        History of smoking greater than 50 pack years   Quit smoking November 2024.  Praised for this and recommend continued cessation.  She would like to continue lung screening, new referral placed.      Relevant Orders   Ambulatory Referral for Lung Cancer Scre   Hyperlipidemia   Chronic, ongoing.  Continue current medication regimen and adjust as needed. Have sent secure message to nephrologist to ask if can add on lipid panel and TSH to patient's next labs to reduce amount of blood draws.      Obesity   BMI 30.25. Recommended eating smaller high protein, low fat meals more frequently and exercising 30 mins a day 5 times a week with a goal of 10-15lb weight loss in the next 3 months. Patient voiced their understanding and motivation to adhere to these recommendations.       Other Visit Diagnoses       Flu vaccine need       Flu vaccine today, educated patient.   Relevant Orders   Flu Vaccine Trivalent High Dose (Fluad) (Completed)        Follow up plan: Return in about 6 months (around 07/09/2024) for HTN/HLD, CKD, MOOD, COPD.

## 2024-01-10 NOTE — Assessment & Plan Note (Signed)
Chronic. Noted on lung CT screenings.  Continue statin daily and recommend modest weight loss + focus on healthy diet.  Complete cessation of smoking recommended.

## 2024-01-10 NOTE — Assessment & Plan Note (Signed)
Noted possible on past imaging, continue current medication regimen and collaboration with cardiology.

## 2024-01-10 NOTE — Assessment & Plan Note (Signed)
 Chronic, ongoing and worsening.  Tramadol  makes her too loopy and no benefit from OTC medications.  No benefit from steroid injection last year.  Will place referral to [facility] ortho per request for further recommendations.

## 2024-01-10 NOTE — Assessment & Plan Note (Signed)
Followed by Dr. Orlie Dakin in past, has been discharged at this time.  Continue collaboration as needed and review of notes.  Continue to follow with nephrology and urology as scheduled.

## 2024-01-10 NOTE — Assessment & Plan Note (Signed)
 Chronic, stable without inhalers at this time.  Recommend complete cessation of smoking.  Spirometry March 2023 noting FEV1 96% and FEV1/FVC 112% -- reviewed with patient at the time.  Continue yearly lung screening + collaboration with cardiology.  New referral to restart lung screening placed.  Plan on repeat spirometry next visit.

## 2024-01-10 NOTE — Assessment & Plan Note (Signed)
 Chronic, ongoing.  Continue current medication regimen and adjust as needed. Have sent secure message to nephrologist to ask if can add on lipid panel and TSH to patient's next labs to reduce amount of blood draws.

## 2024-01-26 ENCOUNTER — Ambulatory Visit: Payer: Medicare HMO

## 2024-01-28 DIAGNOSIS — M170 Bilateral primary osteoarthritis of knee: Secondary | ICD-10-CM | POA: Diagnosis not present

## 2024-02-10 DIAGNOSIS — N281 Cyst of kidney, acquired: Secondary | ICD-10-CM | POA: Diagnosis not present

## 2024-02-10 DIAGNOSIS — I10 Essential (primary) hypertension: Secondary | ICD-10-CM | POA: Diagnosis not present

## 2024-02-10 DIAGNOSIS — I129 Hypertensive chronic kidney disease with stage 1 through stage 4 chronic kidney disease, or unspecified chronic kidney disease: Secondary | ICD-10-CM | POA: Diagnosis not present

## 2024-02-10 DIAGNOSIS — Z905 Acquired absence of kidney: Secondary | ICD-10-CM | POA: Diagnosis not present

## 2024-02-10 DIAGNOSIS — N184 Chronic kidney disease, stage 4 (severe): Secondary | ICD-10-CM | POA: Diagnosis not present

## 2024-02-10 DIAGNOSIS — D631 Anemia in chronic kidney disease: Secondary | ICD-10-CM | POA: Diagnosis not present

## 2024-02-10 DIAGNOSIS — N2581 Secondary hyperparathyroidism of renal origin: Secondary | ICD-10-CM | POA: Diagnosis not present

## 2024-02-11 DIAGNOSIS — M170 Bilateral primary osteoarthritis of knee: Secondary | ICD-10-CM | POA: Diagnosis not present

## 2024-02-16 DIAGNOSIS — D631 Anemia in chronic kidney disease: Secondary | ICD-10-CM | POA: Diagnosis not present

## 2024-02-16 DIAGNOSIS — I129 Hypertensive chronic kidney disease with stage 1 through stage 4 chronic kidney disease, or unspecified chronic kidney disease: Secondary | ICD-10-CM | POA: Diagnosis not present

## 2024-02-16 DIAGNOSIS — I10 Essential (primary) hypertension: Secondary | ICD-10-CM | POA: Diagnosis not present

## 2024-02-16 DIAGNOSIS — N184 Chronic kidney disease, stage 4 (severe): Secondary | ICD-10-CM | POA: Diagnosis not present

## 2024-02-16 DIAGNOSIS — R600 Localized edema: Secondary | ICD-10-CM | POA: Diagnosis not present

## 2024-02-16 DIAGNOSIS — Z905 Acquired absence of kidney: Secondary | ICD-10-CM | POA: Diagnosis not present

## 2024-02-16 DIAGNOSIS — N2581 Secondary hyperparathyroidism of renal origin: Secondary | ICD-10-CM | POA: Diagnosis not present

## 2024-02-24 ENCOUNTER — Other Ambulatory Visit: Payer: Self-pay | Admitting: Nurse Practitioner

## 2024-02-24 ENCOUNTER — Telehealth: Payer: Self-pay | Admitting: Cardiovascular Disease

## 2024-02-24 NOTE — Telephone Encounter (Unsigned)
 Copied from CRM [id number]. Topic: Clinical - Medication Refill >> Feb 24, 2024 10:16 AM Marlow Baars wrote: Most Recent Primary Care Visit:  Provider: Aura Dials T  Department: [facility]  Visit Type: OFFICE VISIT  Date: 01/10/2024  Medication: amLODipine (NORVASC) 5 MG tablet, atenolol (TENORMIN) 100 MG tablet, atorvastatin (LIPITOR) 40 MG tablet, citalopram (CELEXA) 20 MG tablet, losartan (COZAAR) 25 MG tablet, calcitRIOL (ROCALTROL) 0.25 MCG capsule,   Has the patient contacted their pharmacy? Yes   Is this the correct pharmacy for this prescription? Yes If no, delete pharmacy and type the correct one.  This is the patient's preferred pharmacy:  [id number] - [place], [place] - [address] [place] [address] Phone: [phone] Fax: [phone]   Has the prescription been filled recently? No  Is the patient out of the medication? No but she is very low  Has the patient been seen for an appointment in the last year OR does the patient have an upcoming appointment? Yes  Can we respond through MyChart? No   Please assist patient further

## 2024-02-24 NOTE — Telephone Encounter (Signed)
*  STAT* If patient is at the pharmacy, call can be transferred to refill team.   1. Which medications need to be refilled? (please list name of each medication and dose if known) furosemide (LASIX) 40 MG tablet   2. Which pharmacy/location (including street and city if local pharmacy) is medication to be sent to?  [id number] - [place], [place] - [address]    3. Do they need a 30 day or 90 day supply? 90  Patient has appt 5/19

## 2024-02-25 ENCOUNTER — Other Ambulatory Visit: Payer: Self-pay | Admitting: Nurse Practitioner

## 2024-02-25 ENCOUNTER — Other Ambulatory Visit: Payer: Self-pay | Admitting: Cardiovascular Disease

## 2024-02-25 MED ORDER — ATORVASTATIN CALCIUM 40 MG PO TABS: 40.0000 mg | ORAL_TABLET | Freq: Every day | ORAL | 3 refills | Status: AC

## 2024-02-25 MED ORDER — AMLODIPINE BESYLATE 5 MG PO TABS: 5.0000 mg | ORAL_TABLET | Freq: Every day | ORAL | 1 refills | Status: DC

## 2024-02-25 MED ORDER — CALCITRIOL 0.25 MCG PO CAPS: 0.2500 ug | ORAL_CAPSULE | Freq: Every day | ORAL | 11 refills | Status: AC

## 2024-02-25 MED ORDER — ATENOLOL 100 MG PO TABS: 100.0000 mg | ORAL_TABLET | Freq: Every day | ORAL | 4 refills | Status: AC

## 2024-02-25 MED ORDER — CITALOPRAM HYDROBROMIDE 20 MG PO TABS
20.0000 mg | ORAL_TABLET | Freq: Every day | ORAL | 1 refills | Status: DC
Start: 2024-02-25 — End: 2024-07-12

## 2024-02-25 MED ORDER — LOSARTAN POTASSIUM 25 MG PO TABS: 25.0000 mg | ORAL_TABLET | Freq: Every day | ORAL | 3 refills | Status: AC

## 2024-02-25 MED ORDER — FUROSEMIDE 40 MG PO TABS: 40.0000 mg | ORAL_TABLET | Freq: Every day | ORAL | 1 refills | Status: DC

## 2024-02-25 NOTE — Telephone Encounter (Signed)
 Requested medication (s) are due for refill today- yes  Requested medication (s) are on the active medication list -yes  Future visit scheduled -yes  Last refill: Atenolol 01/08/23 #90 4RF- fails lab protocol- over 1 year-03/03/22                 Calcitriol 04/14/23- historical provider, fails lab protocol- no results                 Losartan 04/07/23- historical provider, fails lab protocol   Notes to clinic: See above  Requested Prescriptions  Pending Prescriptions Disp Refills   atenolol (TENORMIN) 100 MG tablet 90 tablet 4    Sig: Take 1 tablet (100 mg total) by mouth daily.     Cardiovascular: Beta Blockers 2 Failed - 02/25/2024  2:00 PM      Failed - Cr in normal range and within 360 days    Creatinine, Ser  Date Value Ref Range Status  03/23/2022 2.16 (H) 0.57 - 1.00 mg/dL Final         Passed - Last BP in normal range    BP Readings from Last 1 Encounters:  01/10/24 132/78         Passed - Last Heart Rate in normal range    Pulse Readings from Last 1 Encounters:  01/10/24 73         Passed - Valid encounter within last 6 months    Recent Outpatient Visits           1 month ago 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 chronic kidney disease stage IV (HCC)   [place] [facility] [place], Dorie Rank, NP       Future Appointments             In 1 month Gollan, Tollie Pizza, MD [facility] [place]   In 4 months Helen, Greenbush T, NP [place] [facility]             calcitRIOL (ROCALTROL) 0.25 MCG capsule 30 capsule 11    Sig: Take 1 capsule (0.25 mcg total) by mouth daily.     Endocrinology:  Vitamins - Vitamin D Supplementation - calcitriol Failed - 02/25/2024  2:00 PM      Failed - Phosphate in normal range and within 360 days    No results found for: "PHOS"       Failed - PTH in normal range and within 360 days    No results found for: "IOPTH", "PTHINTACTFNA", "PTH"       Failed  - Ca in normal range and within 360 days    Calcium  Date Value Ref Range Status  03/23/2022 9.4 8.7 - 10.3 mg/dL Final         Passed - Valid encounter within last 12 months    Recent Outpatient Visits           1 month ago 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 chronic kidney disease stage IV (HCC)   [place] [facility] [place], Dorie Rank, NP       Future Appointments             In 1 month Gollan, Tollie Pizza, MD [facility] [place]   In 4 months Cannady, Dorie Rank, NP  [facility]             losartan (COZAAR) 25 MG tablet  Sig: Take 1 tablet (25 mg total) by mouth daily.     Cardiovascular:  Angiotensin Receptor Blockers Failed - 02/25/2024  2:00 PM      Failed - Cr in normal range and within 180 days    Creatinine, Ser  Date Value Ref Range Status  03/23/2022 2.16 (H) 0.57 - 1.00 mg/dL Final         Failed - K in normal range and within 180 days    Potassium  Date Value Ref Range Status  03/23/2022 4.0 3.5 - 5.2 mmol/L Final         Passed - Patient is not pregnant      Passed - Last BP in normal range    BP Readings from Last 1 Encounters:  01/10/24 132/78         Passed - Valid encounter within last 6 months    Recent Outpatient Visits           1 month ago 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 chronic kidney disease stage IV (HCC)   [place] [facility] [place], Dorie Rank, NP       Future Appointments             In 1 month Gollan, Tollie Pizza, MD [facility] [place]   In 4 months [place], Foreman T, NP [place] [facility]            Signed Prescriptions Disp Refills   amLODipine (NORVASC) 5 MG tablet 90 tablet 1    Sig: Take 1 tablet (5 mg total) by mouth daily.     Cardiovascular: Calcium Channel Blockers 2 Passed - 02/25/2024  2:00 PM      Passed - Last BP in  normal range    BP Readings from Last 1 Encounters:  01/10/24 132/78         Passed - Last Heart Rate in normal range    Pulse Readings from Last 1 Encounters:  01/10/24 73         Passed - Valid encounter within last 6 months    Recent Outpatient Visits           1 month ago 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 chronic kidney disease stage IV (HCC)   [place] [facility] [place], Dorie Rank, NP       Future Appointments             In 1 month Gollan, Tollie Pizza, MD [facility] [place]   In 4 months Tappen, Bradley T, NP [place] [facility]             atorvastatin (LIPITOR) 40 MG tablet 90 tablet 3    Sig: Take 1 tablet (40 mg total) by mouth daily.     Cardiovascular:  Antilipid - Statins Failed - 02/25/2024  2:00 PM      Failed - Lipid Panel in normal range within the last 12 months    Cholesterol, Total  Date Value Ref Range Status  07/09/2023 160 100 - 199 mg/dL Final   Cholesterol Piccolo, Waived  Date Value Ref Range Status  01/07/2018 123 <200 mg/dL Final    Comment:                            Desirable                <  200                         Borderline High      200- 239                         High                     >239    LDL Chol Calc (NIH)  Date Value Ref Range Status  07/09/2023 98 0 - 99 mg/dL Final   HDL  Date Value Ref Range Status  07/09/2023 49 >39 mg/dL Final   Triglycerides  Date Value Ref Range Status  07/09/2023 67 0 - 149 mg/dL Final   Triglycerides Piccolo,Waived  Date Value Ref Range Status  01/07/2018 118 <150 mg/dL Final    Comment:                            Normal                   <150                         Borderline High     150 - 199                         High                200 - 499                         Very High                >499          Passed - Patient is not pregnant      Passed - Valid encounter within  last 12 months    Recent Outpatient Visits           1 month ago 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 chronic kidney disease stage IV (HCC)   [place] [facility] [place], Dorie Rank, NP       Future Appointments             In 1 month Gollan, Tollie Pizza, MD [facility] [place]   In 4 months [place], Preston T, NP [place] [facility]             citalopram (CELEXA) 20 MG tablet 90 tablet 1    Sig: Take 1 tablet (20 mg total) by mouth daily.     Psychiatry:  Antidepressants - SSRI Passed - 02/25/2024  2:00 PM      Passed - Completed PHQ-2 or PHQ-9 in the last 360 days      Passed - Valid encounter within last 6 months    Recent Outpatient Visits           1 month ago 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 chronic kidney disease stage IV (HCC)   [place] [facility] [place], Dorie Rank, NP       Future Appointments             In 1 month Gollan, Tollie Pizza, MD [facility] [place]   In 4 months  Marjie Skiff, NP [place] [facility]               Requested Prescriptions  Pending Prescriptions Disp Refills   atenolol (TENORMIN) 100 MG tablet 90 tablet 4    Sig: Take 1 tablet (100 mg total) by mouth daily.     Cardiovascular: Beta Blockers 2 Failed - 02/25/2024  2:00 PM      Failed - Cr in normal range and within 360 days    Creatinine, Ser  Date Value Ref Range Status  03/23/2022 2.16 (H) 0.57 - 1.00 mg/dL Final         Passed - Last BP in normal range    BP Readings from Last 1 Encounters:  01/10/24 132/78         Passed - Last Heart Rate in normal range    Pulse Readings from Last 1 Encounters:  01/10/24 73         Passed - Valid encounter within last 6 months    Recent Outpatient Visits           1 month ago 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  chronic kidney disease stage IV (HCC)   [place] [facility] [place], Dorie Rank, NP       Future Appointments             In 1 month Gollan, Tollie Pizza, MD [facility] [place]   In 4 months [place], Lakeview T, NP [place] [facility]             calcitRIOL (ROCALTROL) 0.25 MCG capsule 30 capsule 11    Sig: Take 1 capsule (0.25 mcg total) by mouth daily.     Endocrinology:  Vitamins - Vitamin D Supplementation - calcitriol Failed - 02/25/2024  2:00 PM      Failed - Phosphate in normal range and within 360 days    No results found for: "PHOS"       Failed - PTH in normal range and within 360 days    No results found for: "IOPTH", "PTHINTACTFNA", "PTH"       Failed - Ca in normal range and within 360 days    Calcium  Date Value Ref Range Status  03/23/2022 9.4 8.7 - 10.3 mg/dL Final         Passed - Valid encounter within last 12 months    Recent Outpatient Visits           1 month ago 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 chronic kidney disease stage IV (HCC)   [place] [facility] [place], Dorie Rank, NP       Future Appointments             In 1 month Gollan, Tollie Pizza, MD [facility] [place]   In 4 months [place], Rutherford T, NP  [facility]             losartan (COZAAR) 25 MG tablet      Sig: Take 1 tablet (25 mg total) by mouth daily.     Cardiovascular:  Angiotensin Receptor Blockers Failed - 02/25/2024  2:00 PM      Failed - Cr in normal range and within 180 days    Creatinine, Ser  Date Value Ref Range Status  03/23/2022 2.16 (H) 0.57 - 1.00 mg/dL Final  Failed - K in normal range and within 180 days    Potassium  Date Value Ref Range Status  03/23/2022 4.0 3.5 - 5.2 mmol/L Final         Passed - Patient is not pregnant      Passed - Last BP in normal range    BP Readings from Last 1 Encounters:   01/10/24 132/78         Passed - Valid encounter within last 6 months    Recent Outpatient Visits           1 month ago 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 chronic kidney disease stage IV (HCC)   [place] [facility] [place], Dorie Rank, NP       Future Appointments             In 1 month Gollan, Tollie Pizza, MD [facility] [place]   In 4 months [place], [place] T, NP [place] [facility]            Signed Prescriptions Disp Refills   amLODipine (NORVASC) 5 MG tablet 90 tablet 1    Sig: Take 1 tablet (5 mg total) by mouth daily.     Cardiovascular: Calcium Channel Blockers 2 Passed - 02/25/2024  2:00 PM      Passed - Last BP in normal range    BP Readings from Last 1 Encounters:  01/10/24 132/78         Passed - Last Heart Rate in normal range    Pulse Readings from Last 1 Encounters:  01/10/24 73         Passed - Valid encounter within last 6 months    Recent Outpatient Visits           1 month ago 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 chronic kidney disease stage IV (HCC)   [place] [facility] [place], Dorie Rank, NP       Future Appointments             In 1 month Gollan, Tollie Pizza, MD [facility] [place]   In 4 months [place], Perryville T, NP [place] [facility]             atorvastatin (LIPITOR) 40 MG tablet 90 tablet 3    Sig: Take 1 tablet (40 mg total) by mouth daily.     Cardiovascular:  Antilipid - Statins Failed - 02/25/2024  2:00 PM      Failed - Lipid Panel in normal range within the last 12 months    Cholesterol, Total  Date Value Ref Range Status  07/09/2023 160 100 - 199 mg/dL Final   Cholesterol Piccolo, Waived  Date Value Ref Range Status  01/07/2018 123 <200 mg/dL Final    Comment:                            Desirable                <200                          Borderline High      200- 239                         High                     >  239    LDL Chol Calc (NIH)  Date Value Ref Range Status  07/09/2023 98 0 - 99 mg/dL Final   HDL  Date Value Ref Range Status  07/09/2023 49 >39 mg/dL Final   Triglycerides  Date Value Ref Range Status  07/09/2023 67 0 - 149 mg/dL Final   Triglycerides Piccolo,Waived  Date Value Ref Range Status  01/07/2018 118 <150 mg/dL Final    Comment:                            Normal                   <150                         Borderline High     150 - 199                         High                200 - 499                         Very High                >499          Passed - Patient is not pregnant      Passed - Valid encounter within last 12 months    Recent Outpatient Visits           1 month ago 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 chronic kidney disease stage IV (HCC)   [place] [facility] [place], Dorie Rank, NP       Future Appointments             In 1 month Gollan, Tollie Pizza, MD [facility] [place]   In 4 months [place], Thornport T, NP [place] [facility]             citalopram (CELEXA) 20 MG tablet 90 tablet 1    Sig: Take 1 tablet (20 mg total) by mouth daily.     Psychiatry:  Antidepressants - SSRI Passed - 02/25/2024  2:00 PM      Passed - Completed PHQ-2 or PHQ-9 in the last 360 days      Passed - Valid encounter within last 6 months    Recent Outpatient Visits           1 month ago 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 chronic kidney disease stage IV (HCC)   [place] [facility] [place], Dorie Rank, NP       Future Appointments             In 1 month Gollan, Tollie Pizza, MD [facility] [place]   In 4 months Cannady, Dorie Rank, NP [facility]

## 2024-02-25 NOTE — Telephone Encounter (Signed)
 OV 01/10/24 Requested Prescriptions  Pending Prescriptions Disp Refills   amLODipine (NORVASC) 5 MG tablet 90 tablet 1    Sig: Take 1 tablet (5 mg total) by mouth daily.     Cardiovascular: Calcium Channel Blockers 2 Passed - 02/25/2024  2:00 PM      Passed - Last BP in normal range    BP Readings from Last 1 Encounters:  01/10/24 132/78         Passed - Last Heart Rate in normal range    Pulse Readings from Last 1 Encounters:  01/10/24 73         Passed - Valid encounter within last 6 months    Recent Outpatient Visits           1 month ago 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 chronic kidney disease stage IV (HCC)   [place] [facility] [place], Dorie Rank, NP       Future Appointments             In 1 month Gollan, Tollie Pizza, MD [facility] [place]   In 4 months Cannady, Corrie Dandy T, NP [place] [facility]             atenolol (TENORMIN) 100 MG tablet 90 tablet 4    Sig: Take 1 tablet (100 mg total) by mouth daily.     Cardiovascular: Beta Blockers 2 Failed - 02/25/2024  2:00 PM      Failed - Cr in normal range and within 360 days    Creatinine, Ser  Date Value Ref Range Status  03/23/2022 2.16 (H) 0.57 - 1.00 mg/dL Final         Passed - Last BP in normal range    BP Readings from Last 1 Encounters:  01/10/24 132/78         Passed - Last Heart Rate in normal range    Pulse Readings from Last 1 Encounters:  01/10/24 73         Passed - Valid encounter within last 6 months    Recent Outpatient Visits           1 month ago 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 chronic kidney disease stage IV (HCC)   [place] [facility] [place], Dorie Rank, NP       Future Appointments             In 1 month Gollan, Tollie Pizza, MD [facility] [place]   In 4 months [place], Sumner T, NP [place] [facility]              atorvastatin (LIPITOR) 40 MG tablet 90 tablet 3    Sig: Take 1 tablet (40 mg total) by mouth daily.     Cardiovascular:  Antilipid - Statins Failed - 02/25/2024  2:00 PM      Failed - Lipid Panel in normal range within the last 12 months    Cholesterol, Total  Date Value Ref Range Status  07/09/2023 160 100 - 199 mg/dL Final   Cholesterol Piccolo, Waived  Date Value Ref Range Status  01/07/2018 123 <200 mg/dL Final    Comment:                            Desirable                <  200                         Borderline High      200- 239                         High                     >239    LDL Chol Calc (NIH)  Date Value Ref Range Status  07/09/2023 98 0 - 99 mg/dL Final   HDL  Date Value Ref Range Status  07/09/2023 49 >39 mg/dL Final   Triglycerides  Date Value Ref Range Status  07/09/2023 67 0 - 149 mg/dL Final   Triglycerides Piccolo,Waived  Date Value Ref Range Status  01/07/2018 118 <150 mg/dL Final    Comment:                            Normal                   <150                         Borderline High     150 - 199                         High                200 - 499                         Very High                >499          Passed - Patient is not pregnant      Passed - Valid encounter within last 12 months    Recent Outpatient Visits           1 month ago 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 chronic kidney disease stage IV (HCC)   [place] [facility] [place], Dorie Rank, NP       Future Appointments             In 1 month Gollan, Tollie Pizza, MD [facility] [place]   In 4 months [place], Seymour T, NP [place] [facility]             calcitRIOL (ROCALTROL) 0.25 MCG capsule 30 capsule 11    Sig: Take 1 capsule (0.25 mcg total) by mouth daily.     Endocrinology:  Vitamins - Vitamin D Supplementation - calcitriol Failed - 02/25/2024   2:00 PM      Failed - Phosphate in normal range and within 360 days    No results found for: "PHOS"       Failed - PTH in normal range and within 360 days    No results found for: "IOPTH", "PTHINTACTFNA", "PTH"       Failed - Ca in normal range and within 360 days    Calcium  Date Value Ref Range Status  03/23/2022 9.4 8.7 - 10.3 mg/dL Final         Passed - Valid encounter within last 12 months    Recent Outpatient Visits  1 month ago 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 chronic kidney disease stage IV (HCC)   [place] [facility] [place], Dorie Rank, NP       Future Appointments             In 1 month Gollan, Tollie Pizza, MD [facility] [place]   In 4 months [place], Dorie Rank, NP [place] [facility]             citalopram (CELEXA) 20 MG tablet 90 tablet 1    Sig: Take 1 tablet (20 mg total) by mouth daily.     Psychiatry:  Antidepressants - SSRI Passed - 02/25/2024  2:00 PM      Passed - Completed PHQ-2 or PHQ-9 in the last 360 days      Passed - Valid encounter within last 6 months    Recent Outpatient Visits           1 month ago 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 chronic kidney disease stage IV (HCC)   [place]  [facility] [place], Dorie Rank, NP       Future Appointments             In 1 month Gollan, Tollie Pizza, MD [facility] [place]   In 4 months [place], [place] T, NP [place] [facility]             losartan (COZAAR) 25 MG tablet      Sig: Take 1 tablet (25 mg total) by mouth daily.     Cardiovascular:  Angiotensin Receptor Blockers Failed - 02/25/2024  2:00 PM      Failed - Cr in normal range and within 180 days    Creatinine, Ser  Date Value Ref Range Status  03/23/2022 2.16 (H) 0.57 - 1.00 mg/dL Final         Failed - K in normal range and within 180 days     Potassium  Date Value Ref Range Status  03/23/2022 4.0 3.5 - 5.2 mmol/L Final         Passed - Patient is not pregnant      Passed - Last BP in normal range    BP Readings from Last 1 Encounters:  01/10/24 132/78         Passed - Valid encounter within last 6 months    Recent Outpatient Visits           1 month ago 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 chronic kidney disease stage IV (HCC)   [place] [facility] [place], Dorie Rank, NP       Future Appointments             In 1 month Gollan, Tollie Pizza, MD [facility] [place]   In 4 months Cannady, Dorie Rank, NP [place] [facility]

## 2024-04-16 NOTE — Progress Notes (Signed)
 Cardiology Office Note  Date:  04/17/2024   ID:  Sarah [name], DOB [birth year], MRN [id number]  PCP:  Lemar Pyles, NP   Chief Complaint  Patient presents with   12 month follow up     "Doing well."     HPI:  [name] is a 73 year old woman with past medical history of Brain aneuyrsm, s/p coiling Chronic kidney disease, CR 3 Left nephrectomy stage III renal cell cancer 2018, followed by Dr. Zelda Hickman Hypertension Smoker Emphysema Coronary artery disease, coronary calcification noted on CT scan Aortic atherosclerosis Presenting for follow-up of her mitral valve regurgitation, tricuspid valve regurgitation, moderately elevated right heart pressures, renal failure  Last seen by myself in clinic Sept  2023 On today's visit, she reports that she feels well, Retired, walks with a cane No regular exercise program  Taking Lasix  40 daily Followed by nephrology Chronic trace lower extremity edema Denies significant shortness of breath on exertion  On amlodipine , atenolol , losartan  On today's visit, blood pressure high end of her range  Limited by chronic knee pain, taking Tylenol  Has received gel injections, does not like the feeling of tramadol   Labs reviewed Creatinine up to 3.03 HGB 9.8  EKG personally reviewed by myself on todays visit EKG Interpretation Date/Time:  Monday Apr 17 2024 16:31:03 EDT Ventricular Rate:  65 PR Interval:  142 QRS Duration:  90 QT Interval:  424 QTC Calculation: 440 R Axis:   -5  Text Interpretation: Normal sinus rhythm Normal ECG When compared with ECG of 21-Dec-2016 11:26, No significant change was found Confirmed by Belva Boyden [phone]) on 04/17/2024 4:33:52 PM    Echo, March 2023, 1. Left ventricular ejection fraction, by estimation, is 50 to 55%. The  left ventricle has low normal function. The left ventricle has no regional  wall motion abnormalities. Left ventricular diastolic parameters are  consistent with Grade I  diastolic  dysfunction (impaired relaxation).   2. Right ventricular systolic function is normal. The right ventricular  size is normal. There is moderately elevated pulmonary artery systolic  pressure. The estimated right ventricular systolic pressure is 46.7 mmHg.   3. Left atrial size was moderately dilated.   4. The mitral valve is normal in structure. Moderate mitral valve  regurgitation.   5. Tricuspid valve regurgitation is moderate.   PMH:   has a past medical history of Abnormal mammogram, unspecified, Anemia, Aneurysm (HCC) (2004), Anxiety, Arthritis, Benign neoplasm of breast (2012), Breast complaint (2010), Chronic kidney disease, Hypertension, Obesity, unspecified, Personal history of tobacco use, presenting hazards to health, Renal cancer, left (HCC) (11/2016), and Special screening for malignant neoplasms, colon.  PSH:    Past Surgical History:  Procedure Laterality Date   ABDOMINAL HYSTERECTOMY  1993   BREAST BIOPSY Right 2012   stereo bx showing a 6mm radial scar in an estimated 8 volume of tissue   BREAST BIOPSY Right Apr 09, 2005   Fibrocystic changes, ductal adenosis, microcalcifications, focal stromal/epithelial proliferation, possible fibroadenoma type proliferation   CEREBRAL ANEURYSM REPAIR  2004   COLONOSCOPY  2008   Dr. Bradford Cadet   EYE SURGERY     FOOT SURGERY Right 2003   LAPAROSCOPIC NEPHRECTOMY, HAND ASSISTED Left 01/01/2017   Procedure: HAND ASSISTED LAPAROSCOPIC NEPHRECTOMY;  Surgeon: Bart Born, MD;  Location: [facility] ORS;  Service: Urology;  Laterality: Left;   TONSILLECTOMY  1997    Current Outpatient Medications  Medication Sig Dispense Refill   acetaminophen  (TYLENOL ) 325 MG tablet Take 650 mg by mouth  every 6 (six) hours as needed for mild pain, moderate pain or headache.      amLODipine  (NORVASC ) 5 MG tablet Take 1 tablet (5 mg total) by mouth daily. 90 tablet 1   atenolol  (TENORMIN ) 100 MG tablet Take 1 tablet (100 mg total) by mouth daily. 90  tablet 4   atorvastatin  (LIPITOR) 40 MG tablet Take 1 tablet (40 mg total) by mouth daily. 90 tablet 3   calcitRIOL  (ROCALTROL ) 0.25 MCG capsule Take 1 capsule (0.25 mcg total) by mouth daily. 30 capsule 11   Cholecalciferol (VITAMIN D-3 PO) Take 600 Units by mouth daily.     citalopram  (CELEXA ) 20 MG tablet Take 1 tablet (20 mg total) by mouth daily. 90 tablet 1   ferrous sulfate 325 (65 FE) MG tablet Take 325 mg by mouth daily with breakfast.     furosemide  (LASIX ) 40 MG tablet Take 1 tablet (40 mg total) by mouth daily. PLEASE KEEP APPOINTMENT ON 04/17/24 TO RECEIVE FURTHER REFILLS. THANK YOU. 30 tablet 1   Loperamide HCl (IMODIUM PO) Take by mouth. PRN     losartan  (COZAAR ) 25 MG tablet Take 1 tablet (25 mg total) by mouth daily. 90 tablet 3   Multiple Vitamin (MULTIVITAMIN) tablet Take 1 tablet by mouth daily.     traMADol  (ULTRAM ) 50 MG tablet TAKE 1 TABLET BY MOUTH EVERY 12 HOURS AS NEEDED 14 tablet 0   No current facility-administered medications for this visit.    Allergies:   Codeine   Social History:  The patient  reports that she has quit smoking. Her smoking use included cigarettes. She has a 10 pack-year smoking history. She has never used smokeless tobacco. She reports that she does not currently use alcohol. She reports that she does not use drugs.   Family History:   family history includes Cancer in her cousin, father, and another family member; Heart disease in her paternal grandfather and paternal grandmother; Hypertension in her brother, father, paternal grandfather, paternal grandmother, and sister.    Review of Systems: Review of Systems  Constitutional: Negative.   HENT: Negative.    Respiratory: Negative.    Cardiovascular: Negative.   Gastrointestinal: Negative.   Musculoskeletal:  Positive for joint pain.  Neurological: Negative.   Psychiatric/Behavioral: Negative.    All other systems reviewed and are negative.   PHYSICAL EXAM: VS:  BP (!) 140/80 (BP  Location: Left Arm, Patient Position: Sitting, Cuff Size: Normal)   Pulse 65   Ht 5\' 7"  (1.702 m)   Wt 189 lb (85.7 kg)   SpO2 98%   BMI 29.60 kg/m  , BMI Body mass index is 29.6 kg/m. Constitutional:  oriented to person, place, and time. No distress.  HENT:  Head: Grossly normal Eyes:  no discharge. No scleral icterus.  Neck: No JVD, no carotid bruits  Cardiovascular: Regular rate and rhythm, no murmurs appreciated Pulmonary/Chest: Clear to auscultation bilaterally, no wheezes or rails Abdominal: Soft.  no distension.  no tenderness.  Musculoskeletal: Normal range of motion Neurological:  normal muscle tone. Coordination normal. No atrophy Skin: Skin warm and dry Psychiatric: normal affect, pleasant  Recent Labs: 07/09/2023: TSH 2.140 09/15/2023: Hemoglobin 10.5; Platelets 295    Lipid Panel Lab Results  Component Value Date   CHOL 160 07/09/2023   HDL 49 07/09/2023   LDLCALC 98 07/09/2023   TRIG 67 07/09/2023      Wt Readings from Last 3 Encounters:  04/17/24 189 lb (85.7 kg)  01/10/24 187 lb 6.4 oz (85  kg)  07/20/23 180 lb 12.8 oz (82 kg)       ASSESSMENT AND PLAN:  Problem List Items Addressed This Visit       Cardiology Problems   Aortic atherosclerosis (HCC)   Relevant Orders   EKG 12-Lead (Completed)   Pulmonary hypertension, unspecified (HCC)   Mitral valvular insufficiency   Relevant Orders   EKG 12-Lead (Completed)   Tricuspid valve insufficiency   Relevant Orders   EKG 12-Lead (Completed)   Other Visit Diagnoses       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 stage 3b chronic kidney disease (HCC)    -  Primary   Relevant Orders   EKG 12-Lead (Completed)     Stage 3b chronic kidney disease (HCC)         Coronary arteriosclerosis       Relevant Orders   EKG 12-Lead (Completed)      Pulmonary hypertension/mitral valve and tricuspid valve regurgitation elevated right heart pressures on echocardiogram March  2023 on Lasix  40 daily creatinine 3.0 Minimal leg swelling, denies shortness of breath on exertion Activity limited secondary to chronic knee pain, walks with a cane  aortic atherosclerosis Very mild disease noted Reports that she quit smoking Continue statin  Coronary calcification Minimal calcification noted, no ischemic work-up needed at this time Continue statin as above  Smoking Reports that she was able to quit smoking.    Single kidney/chronic kidney disease Followed by nephrology, creatinine up to 3.0 Remains on Lasix  40 daily She has indicated  nephrology has urged to to consider being listed on transplant list  Signed, Juanda Noon, M.D., Ph.D. [facility] [place], [place] [phone]

## 2024-04-17 ENCOUNTER — Encounter: Payer: Self-pay | Admitting: Cardiovascular Disease

## 2024-04-17 ENCOUNTER — Ambulatory Visit: Attending: Cardiovascular Disease | Admitting: Cardiovascular Disease

## 2024-04-17 VITALS — BP 140/80 | HR 65 | Ht 67.0 in | Wt 189.0 lb

## 2024-04-17 DIAGNOSIS — I340 Nonrheumatic mitral (valve) insufficiency: Secondary | ICD-10-CM

## 2024-04-17 DIAGNOSIS — I700 Atherosclerosis of aorta: Secondary | ICD-10-CM

## 2024-04-17 DIAGNOSIS — I1310 Hypertensive heart and chronic kidney disease without heart failure, with stage 1 through stage 4 chronic kidney disease, or unspecified chronic kidney disease: Secondary | ICD-10-CM

## 2024-04-17 DIAGNOSIS — I272 Pulmonary hypertension, unspecified: Secondary | ICD-10-CM

## 2024-04-17 DIAGNOSIS — I071 Rheumatic tricuspid insufficiency: Secondary | ICD-10-CM

## 2024-04-17 DIAGNOSIS — N1832 Chronic kidney disease, stage 3b: Secondary | ICD-10-CM | POA: Diagnosis not present

## 2024-04-17 DIAGNOSIS — I2510 Atherosclerotic heart disease of native coronary artery without angina pectoris: Secondary | ICD-10-CM | POA: Diagnosis not present

## 2024-04-17 MED ORDER — FUROSEMIDE 40 MG PO TABS: 40.0000 mg | ORAL_TABLET | Freq: Every day | ORAL | 3 refills | Status: AC

## 2024-04-17 NOTE — Patient Instructions (Signed)

## 2024-05-08 DIAGNOSIS — Z87891 Personal history of nicotine dependence: Secondary | ICD-10-CM | POA: Diagnosis not present

## 2024-05-08 DIAGNOSIS — I509 Heart failure, unspecified: Secondary | ICD-10-CM | POA: Diagnosis not present

## 2024-05-08 DIAGNOSIS — I130 Hypertensive heart and chronic kidney disease with heart failure and stage 1 through stage 4 chronic kidney disease, or unspecified chronic kidney disease: Secondary | ICD-10-CM | POA: Diagnosis not present

## 2024-05-08 DIAGNOSIS — I700 Atherosclerosis of aorta: Secondary | ICD-10-CM | POA: Diagnosis not present

## 2024-05-08 DIAGNOSIS — N2581 Secondary hyperparathyroidism of renal origin: Secondary | ICD-10-CM | POA: Diagnosis not present

## 2024-05-08 DIAGNOSIS — F325 Major depressive disorder, single episode, in full remission: Secondary | ICD-10-CM | POA: Diagnosis not present

## 2024-05-08 DIAGNOSIS — I2510 Atherosclerotic heart disease of native coronary artery without angina pectoris: Secondary | ICD-10-CM | POA: Diagnosis not present

## 2024-05-08 DIAGNOSIS — Z809 Family history of malignant neoplasm, unspecified: Secondary | ICD-10-CM | POA: Diagnosis not present

## 2024-05-08 DIAGNOSIS — Z833 Family history of diabetes mellitus: Secondary | ICD-10-CM | POA: Diagnosis not present

## 2024-05-08 DIAGNOSIS — N184 Chronic kidney disease, stage 4 (severe): Secondary | ICD-10-CM | POA: Diagnosis not present

## 2024-05-08 DIAGNOSIS — M1990 Unspecified osteoarthritis, unspecified site: Secondary | ICD-10-CM | POA: Diagnosis not present

## 2024-05-08 DIAGNOSIS — I272 Pulmonary hypertension, unspecified: Secondary | ICD-10-CM | POA: Diagnosis not present

## 2024-06-07 DIAGNOSIS — Z905 Acquired absence of kidney: Secondary | ICD-10-CM | POA: Diagnosis not present

## 2024-06-07 DIAGNOSIS — I129 Hypertensive chronic kidney disease with stage 1 through stage 4 chronic kidney disease, or unspecified chronic kidney disease: Secondary | ICD-10-CM | POA: Diagnosis not present

## 2024-06-07 DIAGNOSIS — D631 Anemia in chronic kidney disease: Secondary | ICD-10-CM | POA: Diagnosis not present

## 2024-06-07 DIAGNOSIS — N184 Chronic kidney disease, stage 4 (severe): Secondary | ICD-10-CM | POA: Diagnosis not present

## 2024-06-07 DIAGNOSIS — N2581 Secondary hyperparathyroidism of renal origin: Secondary | ICD-10-CM | POA: Diagnosis not present

## 2024-06-07 DIAGNOSIS — I10 Essential (primary) hypertension: Secondary | ICD-10-CM | POA: Diagnosis not present

## 2024-06-14 ENCOUNTER — Telehealth: Payer: Self-pay | Admitting: Oncology

## 2024-06-14 NOTE — Telephone Encounter (Signed)
 I called pt to get her scheduled with Dr. Georgina and she wants to know what kind of injection is she getting?  Pt would like a callback [phone]

## 2024-06-15 ENCOUNTER — Telehealth: Payer: Self-pay | Admitting: Nurse Practitioner

## 2024-06-15 NOTE — Telephone Encounter (Signed)
 Copied from CRM [id number]. Topic: Medicare AWV >> Jun 15, 2024  2:56 PM Nathanel DEL wrote: Reason for CRM Called 06/15/2024 to sched AWV - MAILBOX FULL  nathanel Paschal; Care Guide Ambulatory Clinical Support [place] [facility] Direct Dial: [phone]

## 2024-06-29 ENCOUNTER — Ambulatory Visit: Admitting: Emergency Medicine

## 2024-06-29 VITALS — Ht 65.5 in | Wt 177.0 lb

## 2024-06-29 DIAGNOSIS — Z0000 Encounter for general adult medical examination without abnormal findings: Secondary | ICD-10-CM

## 2024-06-29 NOTE — Progress Notes (Signed)
 Subjective:   Sarah [name] is a 73 y.o. who presents for a Medicare Wellness preventive visit.  As a reminder, Annual Wellness Visits don't include a physical exam, and some assessments may be limited, especially if this visit is performed virtually. We may recommend an in-person follow-up visit with your provider if needed.  Visit Complete: Virtual I connected with  Sarah [name] on 06/29/24 by a audio enabled telemedicine application and verified that I am speaking with the correct person using two identifiers.  Patient Location: Home  Provider Location: Home Office  I discussed the limitations of evaluation and management by telemedicine. The patient expressed understanding and agreed to proceed.  Vital Signs: Because this visit was a virtual/telehealth visit, some criteria may be missing or patient reported. Any vitals not documented were not able to be obtained and vitals that have been documented are patient reported.  VideoDeclined- This patient declined Librarian, academic. Therefore the visit was completed with audio only.  Persons Participating in Visit: Patient.  AWV Questionnaire: No: Patient Medicare AWV questionnaire was not completed prior to this visit.  Cardiac Risk Factors include: advanced age (>81men, >68 women);dyslipidemia;hypertension     Objective:    Today's Vitals   06/29/24 1512  Weight: 177 lb (80.3 kg)  Height: 5' 5.5 (1.664 m)  PainSc: 5    Body mass index is 29.01 kg/m.     06/29/2024    3:30 PM 03/29/2023    2:06 PM 03/02/2022   11:12 AM 01/15/2022   11:09 AM 02/28/2021   11:25 AM 05/25/2019    2:16 PM 05/24/2019   11:16 AM  Advanced Directives  Does Patient Have a Medical Advance Directive? No No No No No No No  Would patient like information on creating a medical advance directive? Yes (MAU/Ambulatory/Procedural Areas - Information given) No - Patient declined No - Patient declined No - Patient declined  No  - Patient declined       Data saved with a previous flowsheet row definition    Current Medications (verified) Outpatient Encounter Medications as of 06/29/2024  Medication Sig   acetaminophen  (TYLENOL ) 325 MG tablet Take 650 mg by mouth every 6 (six) hours as needed for mild pain, moderate pain or headache.    amLODipine  (NORVASC ) 5 MG tablet Take 1 tablet (5 mg total) by mouth daily.   atenolol  (TENORMIN ) 100 MG tablet Take 1 tablet (100 mg total) by mouth daily.   atorvastatin  (LIPITOR) 40 MG tablet Take 1 tablet (40 mg total) by mouth daily.   calcitRIOL  (ROCALTROL ) 0.25 MCG capsule Take 1 capsule (0.25 mcg total) by mouth daily.   Cholecalciferol (VITAMIN D-3 PO) Take 600 Units by mouth daily.   citalopram  (CELEXA ) 20 MG tablet Take 1 tablet (20 mg total) by mouth daily.   ferrous sulfate 325 (65 FE) MG tablet Take 325 mg by mouth daily with breakfast.   furosemide  (LASIX ) 40 MG tablet Take 1 tablet (40 mg total) by mouth daily.   Loperamide HCl (IMODIUM PO) Take by mouth. PRN   losartan  (COZAAR ) 25 MG tablet Take 1 tablet (25 mg total) by mouth daily.   Multiple Vitamin (MULTIVITAMIN) tablet Take 1 tablet by mouth daily.   traMADol  (ULTRAM ) 50 MG tablet TAKE 1 TABLET BY MOUTH EVERY 12 HOURS AS NEEDED (Patient not taking: Reported on 06/29/2024)   No facility-administered encounter medications on file as of 06/29/2024.    Allergies (verified) Codeine   History: Past Medical History:  Diagnosis Date   Abnormal mammogram, unspecified    Anemia    Aneurysm (HCC) 2004   cerebral x  4, [facility]   Anxiety    Arthritis    Benign neoplasm of breast 2012   Breast complaint 2010   Chronic kidney disease    Renal Cell Cancer   Hypertension    Obesity, unspecified    Personal history of tobacco use, presenting hazards to health    Renal cancer, left (HCC) 11/2016   Renal Cell with Left nephrectomy.   Special screening for malignant neoplasms, colon    Past Surgical  History:  Procedure Laterality Date   ABDOMINAL HYSTERECTOMY  1993   BREAST BIOPSY Right 2012   stereo bx showing a 6mm radial scar in an estimated 8 volume of tissue   BREAST BIOPSY Right Apr 09, 2005   Fibrocystic changes, ductal adenosis, microcalcifications, focal stromal/epithelial proliferation, possible fibroadenoma type proliferation   CEREBRAL ANEURYSM REPAIR  2004   COLONOSCOPY  2008   Dr. Chipper   EYE SURGERY     FOOT SURGERY Right 2003   LAPAROSCOPIC NEPHRECTOMY, HAND ASSISTED Left 01/01/2017   Procedure: HAND ASSISTED LAPAROSCOPIC NEPHRECTOMY;  Surgeon: Redell Lynwood Napoleon, MD;  Location: [facility] ORS;  Service: Urology;  Laterality: Left;   TONSILLECTOMY  1997   Family History  Problem Relation Age of Onset   Cancer Cousin        maternal first cousin with breast cancer   Cancer Other        colon and ovarian cancers, no relationship listed   Cancer Father        prostate   Hypertension Father    Hypertension Sister    Hypertension Brother    Hypertension Paternal Grandmother    Heart disease Paternal Grandmother    Hypertension Paternal Grandfather    Heart disease Paternal Grandfather    Breast cancer Neg Hx    Social History   Socioeconomic History   Marital status: Divorced    Spouse name: Not on file   Number of children: 1   Years of education: Not on file   Highest education level: GED or equivalent  Occupational History    Comment: full time    Occupation: retired  Tobacco Use   Smoking status: Former    Current packs/day: 0.00    Average packs/day: 0.5 packs/day for 54.8 years (27.4 ttl pk-yrs)    Types: Cigarettes    Start date: 57    Quit date: 10/2023    Years since quitting: 0.7   Smokeless tobacco: Never  Vaping Use   Vaping status: Never Used  Substance and Sexual Activity   Alcohol use: Not Currently   Drug use: No   Sexual activity: Yes  Other Topics Concern   Not on file  Social History Narrative   Not on file   Social Drivers  of Health   Financial Resource Strain: Low Risk  (06/29/2024)   Overall Financial Resource Strain (CARDIA)    Difficulty of Paying Living Expenses: Not hard at all  Food Insecurity: No Food Insecurity (06/29/2024)   Hunger Vital Sign    Worried About Running Out of Food in the Last Year: Never true    Ran Out of Food in the Last Year: Never true  Transportation Needs: No Transportation Needs (06/29/2024)   PRAPARE - Administrator, Civil Service (Medical): No    Lack of Transportation (Non-Medical): No  Physical Activity: Inactive (06/29/2024)  Exercise Vital Sign    Days of Exercise per Week: 0 days    Minutes of Exercise per Session: 0 min  Stress: No Stress Concern Present (06/29/2024)   [employer] of Occupational Health - Occupational Stress Questionnaire    Feeling of Stress: Not at all  Social Connections: Moderately Isolated (06/29/2024)   Social Connection and Isolation Panel    Frequency of Communication with Friends and Family: More than three times a week    Frequency of Social Gatherings with Friends and Family: Once a week    Attends Religious Services: More than 4 times per year    Active Member of [employer] or Organizations: No    Attends Engineer, structural: Never    Marital Status: Divorced    Tobacco Counseling Counseling given: Not Answered    Clinical Intake:  Pre-visit preparation completed: Yes  Pain : 0-10 Pain Score: 5  Pain Type: Chronic pain Pain Location: Knee Pain Orientation: Right Pain Descriptors / Indicators: Aching     BMI - recorded: 29.01 Nutritional Status: BMI 25 -29 Overweight Nutritional Risks: None Diabetes: No  No results found for: HGBA1C   How often do you need to have someone help you when you read instructions, pamphlets, or other written materials from your doctor or pharmacy?: 1 - Never  Interpreter Needed?: No  Information entered by :: Vina Ned, CMA   Activities of Daily Living      06/29/2024    3:14 PM 08/10/2023    2:18 PM  In your present state of health, do you have any difficulty performing the following activities:  Hearing? 0   Vision? 0   Difficulty concentrating or making decisions? 0   Walking or climbing stairs? 1   Comment uses cane   Dressing or bathing? 0   Doing errands, shopping? 0   Preparing Food and eating ? N   Using the Toilet? N   In the past six months, have you accidently leaked urine? N   Do you have problems with loss of bowel control? N   Managing your Medications? N   Managing your Finances? N   Housekeeping or managing your Housekeeping? N      Information is confidential and restricted. Go to Review Flowsheets to unlock data.    Patient Care Team: Cannady, Jolene T, NP as PCP - General (Nurse Practitioner) Jacobo Evalene PARAS, MD as Consulting Physician (Oncology) Carolee Manus DASEN., MD (Ophthalmology) Dennise Capri, MD as Consulting Physician (Nephrology) Perla Evalene PARAS, MD as Consulting Physician (Cardiology) Kathlynn Sharper, MD as Consulting Physician (Orthopedic Surgery)  I have updated your Care Teams any recent Medical Services you may have received from other providers in the past year.     Assessment:   This is a routine wellness examination for Alpha.  Hearing/Vision screen Hearing Screening - Comments:: Denies hearing loss  Vision Screening - Comments:: Needs to schedule routine eye exam, Dr. Manus Carolee, [place]   Goals Addressed             This Visit's Progress    Patient Stated       Get on kidney transplant list       Depression Screen     06/29/2024    3:25 PM 01/10/2024   11:46 AM 01/10/2024   11:27 AM 07/09/2023   10:35 AM 03/29/2023    2:04 PM 01/08/2023    3:01 PM 07/08/2022   11:16 AM  PHQ 2/9 Scores  PHQ - 2  Score 0 0  0 0 4 0  PHQ- 9 Score 1 0  0 0 12 0  Exception Documentation   Patient refusal        Fall Risk     06/29/2024    3:32 PM 01/10/2024   11:26 AM 07/09/2023    10:34 AM 03/29/2023    2:07 PM 01/08/2023    3:00 PM  Fall Risk   Falls in the past year? 0 0 0 0 1  Number falls in past yr: 0 0 0 0 1  Injury with Fall? 0 0 0 0 0  Risk for fall due to : Impaired balance/gait;Orthopedic patient;Impaired mobility No Fall Risks No Fall Risks No Fall Risks History of fall(s)  Follow up Falls evaluation completed;Education provided Falls evaluation completed Falls evaluation completed Falls prevention discussed;Falls evaluation completed Falls evaluation completed    MEDICARE RISK AT HOME:  Medicare Risk at Home Any stairs in or around the home?: No (uses elevator) If so, are there any without handrails?: No Home free of loose throw rugs in walkways, pet beds, electrical cords, etc?: Yes Adequate lighting in your home to reduce risk of falls?: Yes Life alert?: Yes Use of a cane, walker or w/c?: Yes (cane) Grab bars in the bathroom?: Yes Shower chair or bench in shower?: Yes Elevated toilet seat or a handicapped toilet?: No  TIMED UP AND GO:  Was the test performed?  No  Cognitive Function: 6CIT completed        06/29/2024    3:34 PM 03/29/2023    2:13 PM 03/02/2022   10:58 AM 02/28/2021   11:28 AM 05/24/2019   11:16 AM  6CIT Screen  What Year? 0 points 0 points 0 points 0 points 0 points  What month? 0 points 0 points 0 points 0 points 0 points  What time? 0 points 0 points 0 points 0 points 0 points  Count back from 20 0 points 0 points 0 points 0 points 0 points  Months in reverse 0 points 0 points 0 points 0 points 0 points  Repeat phrase 0 points 0 points 0 points 6 points 0 points  Total Score 0 points 0 points 0 points 6 points 0 points    Immunizations Immunization History  Administered Date(s) Administered   Fluad Quad(high Dose 65+) 09/14/2019, 09/15/2021, 09/13/2022   Fluad Trivalent(High Dose 65+) 01/10/2024   Influenza, High Dose Seasonal PF 08/30/2017, 08/15/2018, 09/24/2020   Influenza-Unspecified 09/04/2015, 09/18/2016,  08/30/2017   Moderna Sars-Covid-2 Vaccination 02/01/2020, 02/29/2020, 10/12/2020   Pfizer(Comirnaty)Fall Seasonal Vaccine 12 years and older 09/13/2022, 02/16/2023   Pneumococcal Conjugate-13 06/22/2017   Pneumococcal Polysaccharide-23 12/28/2018   Respiratory Syncytial Virus Vaccine,Recomb Aduvanted(Arexvy) 10/14/2022   Td 03/31/2006   Tdap 06/09/2016   Zoster Recombinant(Shingrix) 08/29/2019, 12/29/2019   Zoster, Live 01/24/2013    Screening Tests Health Maintenance  Topic Date Due   Lung Cancer Screening  08/27/2022   COVID-19 Vaccine (6 - 2024-25 season) 08/01/2023   INFLUENZA VACCINE  06/30/2024   MAMMOGRAM  05/17/2025   Medicare Annual Wellness (AWV)  06/29/2025   Colonoscopy  12/24/2025   DTaP/Tdap/Td (3 - Td or Tdap) 06/09/2026   DEXA SCAN  08/29/2029   Pneumococcal Vaccine: 50+ Years  Completed   Hepatitis C Screening  Completed   Zoster Vaccines- Shingrix  Completed   Hepatitis B Vaccines  Aged Out   HPV VACCINES  Aged Out   Meningococcal B Vaccine  Aged Out    Health Maintenance  Health  Maintenance Due  Topic Date Due   Lung Cancer Screening  08/27/2022   COVID-19 Vaccine (6 - 2024-25 season) 08/01/2023   Health Maintenance Items Addressed: See Nurse Notes at the end of this note  Additional Screening:  Vision Screening: Recommended annual ophthalmology exams for early detection of glaucoma and other disorders of the eye. Would you like a referral to an eye doctor? No    Dental Screening: Recommended annual dental exams for proper oral hygiene  Community Resource Referral / Chronic Care Management: CRR required this visit?  No   CCM required this visit?  No   Plan:    I have personally reviewed and noted the following in the patient's chart:   Medical and social history Use of alcohol, tobacco or illicit drugs  Current medications and supplements including opioid prescriptions. Patient is not currently taking opioid prescriptions. Functional  ability and status Nutritional status Physical activity Advanced directives List of other physicians Hospitalizations, surgeries, and ER visits in previous 12 months Vitals Screenings to include cognitive, depression, and falls Referrals and appointments  In addition, I have reviewed and discussed with patient certain preventive protocols, quality metrics, and best practice recommendations. A written personalized care plan for preventive services as well as general preventive health recommendations were provided to patient.   Vina Ned, CMA   06/29/2024   After Visit Summary: (Mail) Due to this being a telephonic visit, the after visit summary with patients personalized plan was offered to patient via mail   Notes:  Needs LDCT, gave ph# to schedule (ordered 01/10/24) Covid and flu vaccines in the fall

## 2024-06-29 NOTE — Patient Instructions (Signed)
 Sarah [name] , Thank you for taking time out of your busy schedule to complete your Annual Wellness Visit with me. I enjoyed our conversation and look forward to speaking with you again next year. I, as well as your care team,  appreciate your ongoing commitment to your health goals. Please review the following plan we discussed and let me know if I can assist you in the future. Your Game plan/ To Do List    Referrals: If you haven't heard from the office you've been referred to, please reach out to them at the phone provided.   Follow up Visits: We will see or speak with you next year for your Next Medicare AWV with our clinical staff Have you seen your provider in the last 6 months (3 months if uncontrolled diabetes)? Yes  Clinician Recommendations: Call [phone] to schedule your lung cancer screening test. Recommend getting a routine eye exam every 2 years. Get the Covid and flu vaccines in the fall. Aim for 30 minutes of exercise or brisk walking, 6-8 glasses of water, and 5 servings of fruits and vegetables each day.       This is a list of the screenings recommended for you:  Health Maintenance  Topic Date Due   Screening for Lung Cancer  08/27/2022   COVID-19 Vaccine (6 - 2024-25 season) 08/01/2023   Flu Shot  06/30/2024   Mammogram  05/17/2025   Medicare Annual Wellness Visit  06/29/2025   Colon Cancer Screening  12/24/2025   DTaP/Tdap/Td vaccine (3 - Td or Tdap) 06/09/2026   DEXA scan (bone density measurement)  08/29/2029   Pneumococcal Vaccine for age over 31  Completed   Hepatitis C Screening  Completed   Zoster (Shingles) Vaccine  Completed   Hepatitis B Vaccine  Aged Out   HPV Vaccine  Aged Out   Meningitis B Vaccine  Aged Out    Advanced directives: (Provided) Advance directive discussed with you today. I have provided a copy for you to complete at home and have notarized. Once this is complete, please bring a copy in to our office so we can scan it into your chart.   Advance Care Planning is important because it:  [x]  Makes sure you receive the medical care that is consistent with your values, goals, and preferences  [x]  It provides guidance to your family and loved ones and reduces their decisional burden about whether or not they are making the right decisions based on your wishes.  Follow the link provided in your after visit summary or read over the paperwork we have mailed to you to help you started getting your Advance Directives in place. If you need assistance in completing these, please reach out to us  so that we can help you!  See attachments for Preventive Care and Fall Prevention Tips.   Fall Prevention in the Home, Adult Falls can cause injuries and affect people of all ages. There are many simple things that you can do to make your home safe and to help prevent falls. If you need it, ask for help making these changes. What actions can I take to prevent falls? General information Use good lighting in all rooms. Make sure to: Replace any light bulbs that burn out. Turn on lights if it is dark and use night-lights. Keep items that you use often in easy-to-reach places. Lower the shelves around your home if needed. Move furniture so that there are clear paths around it. Do not keep throw rugs or  other things on the floor that can make you trip. If any of your floors are uneven, fix them. Add color or contrast paint or tape to clearly mark and help you see: Grab bars or handrails. First and last steps of staircases. Where the edge of each step is. If you use a ladder or stepladder: Make sure that it is fully opened. Do not climb a closed ladder. Make sure the sides of the ladder are locked in place. Have someone hold the ladder while you use it. Know where your pets are as you move through your home. What can I do in the bathroom?     Keep the floor dry. Clean up any water that is on the floor right away. Remove soap buildup in the  bathtub or shower. Buildup makes bathtubs and showers slippery. Use non-skid mats or decals on the floor of the bathtub or shower. Attach bath mats securely with double-sided, non-slip rug tape. If you need to sit down while you are in the shower, use a non-slip stool. Install grab bars by the toilet and in the bathtub and shower. Do not use towel bars as grab bars. What can I do in the bedroom? Make sure that you have a light by your bed that is easy to reach. Do not use any sheets or blankets on your bed that hang to the floor. Have a firm bench or chair with side arms that you can use for support when you get dressed. What can I do in the kitchen? Clean up any spills right away. If you need to reach something above you, use a sturdy step stool that has a grab bar. Keep electrical cables out of the way. Do not use floor polish or wax that makes floors slippery. What can I do with my stairs? Do not leave anything on the stairs. Make sure that you have a light switch at the top and the bottom of the stairs. Have them installed if you do not have them. Make sure that there are handrails on both sides of the stairs. Fix handrails that are broken or loose. Make sure that handrails are as long as the staircases. Install non-slip stair treads on all stairs in your home if they do not have carpet. Avoid having throw rugs at the top or bottom of stairs, or secure the rugs with carpet tape to prevent them from moving. Choose a carpet design that does not hide the edge of steps on the stairs. Make sure that carpet is firmly attached to the stairs. Fix any carpet that is loose or worn. What can I do on the outside of my home? Use bright outdoor lighting. Repair the edges of walkways and driveways and fix any cracks. Clear paths of anything that can make you trip, such as tools or rocks. Add color or contrast paint or tape to clearly mark and help you see high doorway thresholds. Trim any bushes or  trees on the main path into your home. Check that handrails are securely fastened and in good repair. Both sides of all steps should have handrails. Install guardrails along the edges of any raised decks or porches. Have leaves, snow, and ice cleared regularly. Use sand, salt, or ice melt on walkways during winter months if you live where there is ice and snow. In the garage, clean up any spills right away, including grease or oil spills. What other actions can I take? Review your medicines with your health care provider. Some  medicines can make you confused or feel dizzy. This can increase your chance of falling. Wear closed-toe shoes that fit well and support your feet. Wear shoes that have rubber soles and low heels. Use a cane, walker, scooter, or crutches that help you move around if needed. Talk with your provider about other ways that you can decrease your risk of falls. This may include seeing a physical therapist to learn to do exercises to improve movement and strength. Where to find more information Centers for Disease Control and Prevention, STEAD[employer]: TonerPromos.no [employer] on Aging: BaseRingTones.pl National Institute on Aging: BaseRingTones.pl Contact a health care provider if: You are afraid of falling at home. You feel weak, drowsy, or dizzy at home. You fall at home. Get help right away if you: Lose consciousness or have trouble moving after a fall. Have a fall that causes a head injury. These symptoms may be an emergency. Get help right away. Call 911. Do not wait to see if the symptoms will go away. Do not drive yourself to the hospital. This information is not intended to replace advice given to you by your health care provider. Make sure you discuss any questions you have with your health care provider. Document Revised: 07/20/2022 Document Reviewed: 07/20/2022 Elsevier Patient Education  2024 [employer].

## 2024-07-08 NOTE — Patient Instructions (Signed)

## 2024-07-12 ENCOUNTER — Ambulatory Visit (INDEPENDENT_AMBULATORY_CARE_PROVIDER_SITE_OTHER): Payer: Medicare HMO | Admitting: Nurse Practitioner

## 2024-07-12 ENCOUNTER — Encounter: Payer: Self-pay | Admitting: Nurse Practitioner

## 2024-07-12 VITALS — BP 138/82 | HR 73 | Temp 98.1°F | Ht 67.0 in | Wt 181.4 lb

## 2024-07-12 DIAGNOSIS — J432 Centrilobular emphysema: Secondary | ICD-10-CM | POA: Diagnosis not present

## 2024-07-12 DIAGNOSIS — N1832 Chronic kidney disease, stage 3b: Secondary | ICD-10-CM | POA: Diagnosis not present

## 2024-07-12 DIAGNOSIS — D631 Anemia in chronic kidney disease: Secondary | ICD-10-CM | POA: Diagnosis not present

## 2024-07-12 DIAGNOSIS — C642 Malignant neoplasm of left kidney, except renal pelvis: Secondary | ICD-10-CM | POA: Diagnosis not present

## 2024-07-12 DIAGNOSIS — F324 Major depressive disorder, single episode, in partial remission: Secondary | ICD-10-CM

## 2024-07-12 DIAGNOSIS — E66811 Obesity, class 1: Secondary | ICD-10-CM

## 2024-07-12 DIAGNOSIS — I272 Pulmonary hypertension, unspecified: Secondary | ICD-10-CM

## 2024-07-12 DIAGNOSIS — Z87891 Personal history of nicotine dependence: Secondary | ICD-10-CM | POA: Diagnosis not present

## 2024-07-12 DIAGNOSIS — E7800 Pure hypercholesterolemia, unspecified: Secondary | ICD-10-CM

## 2024-07-12 DIAGNOSIS — I1310 Hypertensive heart and chronic kidney disease without heart failure, with stage 1 through stage 4 chronic kidney disease, or unspecified chronic kidney disease: Secondary | ICD-10-CM | POA: Diagnosis not present

## 2024-07-12 DIAGNOSIS — N184 Chronic kidney disease, stage 4 (severe): Secondary | ICD-10-CM

## 2024-07-12 MED ORDER — CITALOPRAM HYDROBROMIDE 20 MG PO TABS: 20.0000 mg | ORAL_TABLET | Freq: Every day | ORAL | 4 refills | Status: AC

## 2024-07-12 NOTE — Assessment & Plan Note (Signed)
 Chronic, stable without inhalers at this time.  Recommend complete cessation of smoking.  Spirometry March 2023 noting FEV1 96% and FEV1/FVC 112% -- reviewed with patient at the time.  Continue yearly lung screening + collaboration with cardiology.  New referral to restart lung screening placed last visit.  Plan on repeat spirometry next visit.

## 2024-07-12 NOTE — Assessment & Plan Note (Signed)
Chronic, ongoing.  Followed by nephrology -- recent labs reviewed and note. 

## 2024-07-12 NOTE — Assessment & Plan Note (Signed)
 Chronic, ongoing.  Continue current medication regimen and adjust as needed. Lipid panel and TSH next visit.

## 2024-07-12 NOTE — Assessment & Plan Note (Signed)
Noted possible on past imaging, continue current medication regimen and collaboration with cardiology.

## 2024-07-12 NOTE — Progress Notes (Signed)
 BP 138/82 (BP Location: Left Arm, Patient Position: Sitting, Cuff Size: Normal)   Pulse 73   Temp 98.1 F (36.7 C) (Oral)   Ht 5' 7 (1.702 m)   Wt 181 lb 6.4 oz (82.3 kg)   SpO2 97%   BMI 28.41 kg/m    Subjective:    Patient ID: Sarah [name], female    DOB: December 25, 1950, 73 y.o.   MRN: [id number]  HPI: Sarah [name] is a 73 y.o. female  Chief Complaint  Patient presents with   Chronic Kidney Disease   COPD   Depression   Hyperlipidemia   Hypertension   HYPERTENSION / HYPERLIPIDEMIA Taking Amlodipine , Atenolol , Lasix , Losartan , and Atorvastatin . Follows with cardiology, last visit 04/17/24. EF 50-55% on 02/03/22. Satisfied with current treatment? yes Duration of hypertension: chronic BP monitoring frequency: not checking BP range: BP medication side effects: no Duration of hyperlipidemia: chronic Cholesterol medication side effects: no Cholesterol supplements: none Medication compliance: good compliance Aspirin: no Recent stressors: yes Recurrent headaches: no Visual changes: no Palpitations: no Dyspnea: no Chest pain: no Lower extremity edema: no Dizzy/lightheaded: yes, is going for infusions upcoming The 10-year ASCVD risk score (Arnett DK, et al., 2019) is: 12.1%   Values used to calculate the score:     Age: 52 years     Clincally relevant sex: Female     Is Non-Hispanic African American: Yes     Diabetic: No     Tobacco smoker: No     Systolic Blood Pressure: 138 mmHg     Is BP treated: Yes     HDL Cholesterol: 49 mg/dL     Total Cholesterol: 160 mg/dL  CHRONIC KIDNEY DISEASE STAGE 4 & RENAL CANCER: Last nephrology visit was on 06/07/24, increased Amlodipine  to 10 MG. LABS: CRT 3.12, eGFR 15, BUN 44, PTH 189. They are discussing kidney transplant with [facility] [place].  Sees urology annually, last 07/20/23.  History of left nephrectomy on January 01, 2017.  CKD status: stable Medications renally dose: yes Previous renal evaluation: yes Pneumovax:  Up to  Date Influenza Vaccine:  Up to Date   COPD Quit smoking in November 2024 and quit drinking many years ago.  Had smoked since her teenage years. Lung CA screening last 08/27/21 noted centrilobular emphysema and aortic atherosclerosis + a stable 3 cm adrenal nodule. No current inhalers.   CT lung also noted possible pulmonary arterial hypertension -- she denies palpitations, fatigue, edema. COPD status: stable Satisfied with current treatment?: yes Oxygen use: no Dyspnea frequency: no Cough frequency: no Rescue inhaler frequency: no Limitation of activity: no Productive cough: none Last Spirometry: March 2023 Pneumovax: Up to Date Influenza: Up to Date  DEPRESSION Takes Celexa  20 MG daily. Mood status: stable Satisfied with current treatment?: yes Symptom severity: moderate  Duration of current treatment : chronic Side effects: no Medication compliance: good compliance Psychotherapy/counseling: none Depressed mood: occasional Anxious mood: occasional Anhedonia: no Significant weight loss or gain: no Insomnia: yes hard to stay asleep at times due to drinking too much water Fatigue: no Feelings of worthlessness or guilt: no Impaired concentration/indecisiveness: no Suicidal ideations: no Hopelessness: no Crying spells: no    07/12/2024    3:11 PM 06/29/2024    3:25 PM 01/10/2024   11:46 AM 07/09/2023   10:35 AM 03/29/2023    2:04 PM  Depression screen PHQ 2/9  Decreased Interest 0 0 0 0 0  Down, Depressed, Hopeless 0 0 0 0 0  PHQ - 2 Score 0 0  0 0 0  Altered sleeping 1 1 0 0 0  Tired, decreased energy 0 0 0 0 0  Change in appetite 0 0 0 0 0  Feeling bad or failure about yourself  0 0 0 0 0  Trouble concentrating 0 0 0 0 0  Moving slowly or fidgety/restless 0 0 0 0 0  Suicidal thoughts 0 0 0 0 0  PHQ-9 Score 1 1 0 0 0  Difficult doing work/chores Not difficult at all Not difficult at all  Not difficult at all Not difficult at all       07/12/2024    3:11 PM 01/10/2024    11:47 AM 07/09/2023   10:35 AM 01/08/2023    3:01 PM  GAD 7 : Generalized Anxiety Score  Nervous, Anxious, on Edge 0 0 1 1  Control/stop worrying 0 0 1 2  Worry too much - different things 0 0 0 2  Trouble relaxing 0 0 0 1  Restless 0 0 0 0  Easily annoyed or irritable 0 0 0 0  Afraid - awful might happen 0 0 0 1  Total GAD 7 Score 0 0 2 7  Anxiety Difficulty Not difficult at all Not difficult at all Not difficult at all    Relevant past medical, surgical, family and social history reviewed and updated as indicated. Interim medical history since our last visit reviewed. Allergies and medications reviewed and updated.  Review of Systems  Constitutional:  Negative for activity change, appetite change, diaphoresis, fatigue and fever.  Respiratory:  Negative for cough, chest tightness and shortness of breath.   Cardiovascular:  Negative for chest pain, palpitations and leg swelling.  Gastrointestinal: Negative.   Neurological:  Negative for dizziness, syncope, weakness, light-headedness, numbness and headaches.  Psychiatric/Behavioral: Negative.     Per HPI unless specifically indicated above     Objective:    BP 138/82 (BP Location: Left Arm, Patient Position: Sitting, Cuff Size: Normal)   Pulse 73   Temp 98.1 F (36.7 C) (Oral)   Ht 5' 7 (1.702 m)   Wt 181 lb 6.4 oz (82.3 kg)   SpO2 97%   BMI 28.41 kg/m   Wt Readings from Last 3 Encounters:  07/12/24 181 lb 6.4 oz (82.3 kg)  06/29/24 177 lb (80.3 kg)  04/17/24 189 lb (85.7 kg)    Physical Exam Vitals and nursing note reviewed.  Constitutional:      General: She is awake. She is not in acute distress.    Appearance: She is well-developed. She is obese. She is not ill-appearing.  HENT:     Head: Normocephalic.     Right Ear: Hearing normal.     Left Ear: Hearing normal.     Nose: Nose normal.     Mouth/Throat:     Mouth: Mucous membranes are moist.  Eyes:     General: Lids are normal.        Right eye: No  discharge.        Left eye: No discharge.     Conjunctiva/sclera: Conjunctivae normal.     Pupils: Pupils are equal, round, and reactive to light.  Neck:     Vascular: No carotid bruit.  Cardiovascular:     Rate and Rhythm: Normal rate and regular rhythm.     Heart sounds: Murmur heard.     Systolic murmur is present with a grade of 2/6.     No gallop.  Pulmonary:     Effort: Pulmonary effort  is normal.     Breath sounds: Normal breath sounds.  Abdominal:     General: Bowel sounds are normal.     Palpations: Abdomen is soft.  Musculoskeletal:     Cervical back: Normal range of motion and neck supple.     Right lower leg: No edema.     Left lower leg: No edema.  Skin:    General: Skin is warm and dry.  Neurological:     Mental Status: She is alert and oriented to person, place, and time.  Psychiatric:        Attention and Perception: Attention normal.        Mood and Affect: Mood normal.        Behavior: Behavior normal. Behavior is cooperative.        Thought Content: Thought content normal.        Judgment: Judgment normal.    Results for orders placed or performed in visit on 07/09/23  Lipid Panel w/o Chol/HDL Ratio   Collection Time: 07/09/23 11:10 AM  Result Value Ref Range   Cholesterol, Total 160 100 - 199 mg/dL   Triglycerides 67 0 - 149 mg/dL   HDL 49 >60 mg/dL   VLDL Cholesterol Cal 13 5 - 40 mg/dL   LDL Chol Calc (NIH) 98 0 - 99 mg/dL  TSH   Collection Time: 07/09/23 11:10 AM  Result Value Ref Range   TSH 2.140 0.450 - 4.500 uIU/mL      Assessment & Plan:   Problem List Items Addressed This Visit       Cardiovascular and Mediastinum   Pulmonary hypertension, unspecified (HCC)   Noted possible on past imaging, continue current medication regimen and collaboration with cardiology.      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 BP at goal for age in office today on recheck.  Followed by nephrology. Recommend she continue to check at home and monitor diet, DASH diet. Continue current medication regimen and adjust as needed + collaboration with nephrology.  Recent labs with nephrology obtained and reviewed today.  Return in 6 months.        Relevant Medications   amLODipine  (NORVASC ) 10 MG tablet     Respiratory   Pulmonary emphysema (HCC)   Chronic, stable without inhalers at this time.  Recommend complete cessation of smoking.  Spirometry March 2023 noting FEV1 96% and FEV1/FVC 112% -- reviewed with patient at the time.  Continue yearly lung screening + collaboration with cardiology.  New referral to restart lung screening placed last visit.  Plan on repeat spirometry next visit.        Genitourinary   Renal cancer, left (HCC) - Primary   Followed by Dr. Jacobo in past, has been discharged at this time.  Continue collaboration as needed and review of notes.  Continue to follow with nephrology and urology as scheduled.      CKD (chronic kidney disease) stage 4, GFR 15-29 ml/min (HCC)   Chronic, ongoing.  Recent labs obtained with nephrology.  Continue current medication regimen and collaboration with nephrology.   Reviewed recent labs and note.       Anemia in chronic kidney disease   Chronic, ongoing.  Followed by nephrology -- recent labs reviewed and note.        Other   Hyperlipidemia   Chronic, ongoing.  Continue current medication regimen and adjust as needed. Lipid panel and TSH next visit.      Relevant Medications   amLODipine  (NORVASC ) 10 MG tablet   History of smoking greater than 50 pack years   Quit smoking November 2024.  Praised for this and recommend continued cessation.  She would like to continue lung screening, new referral placed last visit and recommend she call to schedule.      Depression   Chronic, ongoing.  Continue current medication regimen and adjust as  needed.  Refills sent in as needed.  Denies SI/HI.        Relevant Medications   citalopram  (CELEXA ) 20 MG tablet     Follow up plan: Return in about 6 months (around 01/12/2025) for HTN/HLD, CKD, Depression, ANXIETY, COPD.

## 2024-07-12 NOTE — Assessment & Plan Note (Signed)
Followed by Dr. Orlie Dakin in past, has been discharged at this time.  Continue collaboration as needed and review of notes.  Continue to follow with nephrology and urology as scheduled.

## 2024-07-12 NOTE — Assessment & Plan Note (Signed)
Chronic, ongoing.  Continue current medication regimen and adjust as needed.  Refills sent in as needed.  Denies SI/HI.   

## 2024-07-12 NOTE — Assessment & Plan Note (Signed)
Chronic, ongoing.  Recent labs obtained with nephrology.  Continue current medication regimen and collaboration with nephrology.   Reviewed recent labs and note.  

## 2024-07-12 NOTE — Assessment & Plan Note (Signed)
 Chronic, ongoing. BP at goal for age in office today on recheck.  Followed by nephrology. Recommend she continue to check at home and monitor diet, DASH diet. Continue current medication regimen and adjust as needed + collaboration with nephrology.  Recent labs with nephrology obtained and reviewed today.  Return in 6 months.

## 2024-07-12 NOTE — Assessment & Plan Note (Signed)
 Quit smoking November 2024.  Praised for this and recommend continued cessation.  She would like to continue lung screening, new referral placed last visit and recommend she call to schedule.

## 2024-07-13 ENCOUNTER — Other Ambulatory Visit: Payer: Self-pay | Admitting: Oncology

## 2024-07-17 ENCOUNTER — Encounter: Payer: Self-pay | Admitting: Oncology

## 2024-07-17 ENCOUNTER — Inpatient Hospital Stay: Attending: Oncology

## 2024-07-17 ENCOUNTER — Other Ambulatory Visit: Payer: Self-pay

## 2024-07-17 ENCOUNTER — Inpatient Hospital Stay: Admitting: Oncology

## 2024-07-17 ENCOUNTER — Inpatient Hospital Stay

## 2024-07-17 VITALS — BP 122/68 | HR 80 | Temp 98.7°F | Resp 18 | Ht 67.0 in | Wt 182.0 lb

## 2024-07-17 DIAGNOSIS — C642 Malignant neoplasm of left kidney, except renal pelvis: Secondary | ICD-10-CM

## 2024-07-17 DIAGNOSIS — N184 Chronic kidney disease, stage 4 (severe): Secondary | ICD-10-CM

## 2024-07-17 DIAGNOSIS — I129 Hypertensive chronic kidney disease with stage 1 through stage 4 chronic kidney disease, or unspecified chronic kidney disease: Secondary | ICD-10-CM | POA: Insufficient documentation

## 2024-07-17 DIAGNOSIS — Z87891 Personal history of nicotine dependence: Secondary | ICD-10-CM | POA: Insufficient documentation

## 2024-07-17 DIAGNOSIS — D631 Anemia in chronic kidney disease: Secondary | ICD-10-CM

## 2024-07-17 DIAGNOSIS — Z85528 Personal history of other malignant neoplasm of kidney: Secondary | ICD-10-CM | POA: Insufficient documentation

## 2024-07-17 LAB — CBC WITH DIFFERENTIAL (CANCER CENTER ONLY)
Abs Immature Granulocytes: 0.02 K/uL (ref 0.00–0.07)
Basophils Absolute: 0 K/uL (ref 0.0–0.1)
Basophils Relative: 1 %
Eosinophils Absolute: 0.1 K/uL (ref 0.0–0.5)
Eosinophils Relative: 2 %
HCT: 27.9 % — ABNORMAL LOW (ref 36.0–46.0)
Hemoglobin: 9.1 g/dL — ABNORMAL LOW (ref 12.0–15.0)
Immature Granulocytes: 0 %
Lymphocytes Relative: 31 %
Lymphs Abs: 2.1 K/uL (ref 0.7–4.0)
MCH: 29 pg (ref 26.0–34.0)
MCHC: 32.6 g/dL (ref 30.0–36.0)
MCV: 88.9 fL (ref 80.0–100.0)
Monocytes Absolute: 0.7 K/uL (ref 0.1–1.0)
Monocytes Relative: 10 %
Neutro Abs: 3.9 K/uL (ref 1.7–7.7)
Neutrophils Relative %: 56 %
Platelet Count: 244 K/uL (ref 150–400)
RBC: 3.14 MIL/uL — ABNORMAL LOW (ref 3.87–5.11)
RDW: 15.3 % (ref 11.5–15.5)
WBC Count: 6.8 K/uL (ref 4.0–10.5)
nRBC: 0 % (ref 0.0–0.2)

## 2024-07-17 LAB — CMP (CANCER CENTER ONLY)
ALT: 9 U/L (ref 0–44)
AST: 16 U/L (ref 15–41)
Albumin: 3.7 g/dL (ref 3.5–5.0)
Alkaline Phosphatase: 58 U/L (ref 38–126)
Anion gap: 10 (ref 5–15)
BUN: 61 mg/dL — ABNORMAL HIGH (ref 8–23)
CO2: 19 mmol/L — ABNORMAL LOW (ref 22–32)
Calcium: 8.7 mg/dL — ABNORMAL LOW (ref 8.9–10.3)
Chloride: 106 mmol/L (ref 98–111)
Creatinine: 3.32 mg/dL — ABNORMAL HIGH (ref 0.44–1.00)
GFR, Estimated: 14 mL/min — ABNORMAL LOW (ref >60)
Glucose, Bld: 102 mg/dL — ABNORMAL HIGH (ref 70–99)
Potassium: 3.7 mmol/L (ref 3.5–5.1)
Sodium: 135 mmol/L (ref 135–145)
Total Bilirubin: 0.5 mg/dL (ref 0.0–1.2)
Total Protein: 7.2 g/dL (ref 6.5–8.1)

## 2024-07-17 MED ORDER — DARBEPOETIN ALFA 200 MCG/0.4ML IJ SOSY
200.0000 ug | PREFILLED_SYRINGE | Freq: Once | INTRAMUSCULAR | Status: AC
  Administered 2024-07-17: 200 ug via SUBCUTANEOUS
  Filled 2024-07-17: qty 0.4

## 2024-07-17 NOTE — Progress Notes (Signed)
 Patient is having some knee pain, rates her pain at 5. She is feeling more sluggish lately. Noticed a slight decrease in her appetite.

## 2024-07-17 NOTE — Progress Notes (Signed)
 Patient [name]  [phone] [phone]  ID: Sarah [name] OB: [birth year]  MR#: [id number]  RDW#:[id number]  Patient Care Team: Valerio Melanie DASEN, NP as PCP - General (Nurse Practitioner) Jacobo Evalene PARAS, MD as Consulting Physician (Oncology) Carolee Manus DASEN., MD (Ophthalmology) Dennise Capri, MD as Consulting Physician (Nephrology) Perla Evalene PARAS, MD as Consulting Physician (Cardiology) Kathlynn Sharper, MD as Consulting Physician (Orthopedic Surgery)   CHIEF COMPLAINT: Anemia secondary to chronic renal insufficiency.  INTERVAL HISTORY: Patient last seen and evaluated in February 2023.  She is referred back for persistent anemia of chronic renal insufficiency and consideration of Aranesp .  She currently feels well and is asymptomatic.  She has no neurologic complaints.  She denies any recent fevers or illnesses.  She has a good appetite and denies weight loss. She denies any chest pain, shortness of breath, cough, or hemoptysis.  She has no abdominal pain.  She denies any nausea, vomiting, constipation, or diarrhea. She has no urinary complaints.  Patient offers no specific complaints today.  REVIEW OF SYSTEMS:   Review of Systems  Constitutional: Negative.  Negative for fever, malaise/fatigue and weight loss.  Respiratory: Negative.  Negative for cough and shortness of breath.   Cardiovascular: Negative.  Negative for chest pain and leg swelling.  Gastrointestinal: Negative.  Negative for abdominal pain.  Genitourinary: Negative.  Negative for flank pain and hematuria.  Musculoskeletal: Negative.  Negative for back pain.  Skin: Negative.  Negative for rash.  Neurological: Negative.  Negative for sensory change, focal weakness and weakness.  Psychiatric/Behavioral: Negative.  The patient is not nervous/anxious.     As per HPI. Otherwise, a complete review of systems is negative.  PAST MEDICAL HISTORY: Past Medical History:   Diagnosis Date   Abnormal mammogram, unspecified    Anemia    Aneurysm (HCC) 2004   cerebral x  4, [facility]   Anxiety    Arthritis    Benign neoplasm of breast 2012   Breast complaint 2010   Chronic kidney disease    Renal Cell Cancer   Hypertension    Obesity, unspecified    Personal history of tobacco use, presenting hazards to health    Renal cancer, left (HCC) 11/2016   Renal Cell with Left nephrectomy.   Special screening for malignant neoplasms, colon     PAST SURGICAL HISTORY: Past Surgical History:  Procedure Laterality Date   ABDOMINAL HYSTERECTOMY  1993   BREAST BIOPSY Right 2012   stereo bx showing a 6mm radial scar in an estimated 8 volume of tissue   BREAST BIOPSY Right Apr 09, 2005   Fibrocystic changes, ductal adenosis, microcalcifications, focal stromal/epithelial proliferation, possible fibroadenoma type proliferation   CEREBRAL ANEURYSM REPAIR  2004   COLONOSCOPY  2008   Dr. Chipper   EYE SURGERY     FOOT SURGERY Right 2003   LAPAROSCOPIC NEPHRECTOMY, HAND ASSISTED Left 01/01/2017   Procedure: HAND ASSISTED LAPAROSCOPIC NEPHRECTOMY;  Surgeon: Redell Lynwood Napoleon, MD;  Location: [facility] ORS;  Service: Urology;  Laterality: Left;   TONSILLECTOMY  1997    FAMILY HISTORY: Family History  Problem Relation Age of Onset   Cancer Cousin        maternal first cousin with breast cancer   Cancer Other        colon and ovarian cancers, no relationship listed   Cancer Father        prostate   Hypertension Father    Hypertension Sister  Hypertension Brother    Hypertension Paternal Grandmother    Heart disease Paternal Grandmother    Hypertension Paternal Grandfather    Heart disease Paternal Grandfather    Breast cancer Neg Hx     ADVANCED DIRECTIVES (Y/N):  N  HEALTH MAINTENANCE: Social History   Tobacco Use   Smoking status: Former    Current packs/day: 0.00    Average packs/day: 0.5 packs/day for 54.8 years (27.4 ttl pk-yrs)    Types:  Cigarettes    Start date: 48    Quit date: 10/2023    Years since quitting: 0.7   Smokeless tobacco: Never  Vaping Use   Vaping status: Never Used  Substance Use Topics   Alcohol use: Not Currently   Drug use: No     Colonoscopy:  PAP:  Bone density:  Lipid panel:  Allergies  Allergen Reactions   Codeine Itching    Current Outpatient Medications  Medication Sig Dispense Refill   acetaminophen  (TYLENOL ) 325 MG tablet Take 650 mg by mouth every 6 (six) hours as needed for mild pain, moderate pain or headache.      amLODipine  (NORVASC ) 10 MG tablet Take 10 mg by mouth daily.     atenolol  (TENORMIN ) 100 MG tablet Take 1 tablet (100 mg total) by mouth daily. 90 tablet 4   atorvastatin  (LIPITOR) 40 MG tablet Take 1 tablet (40 mg total) by mouth daily. 90 tablet 3   calcitRIOL  (ROCALTROL ) 0.25 MCG capsule Take 1 capsule (0.25 mcg total) by mouth daily. 30 capsule 11   Cholecalciferol (VITAMIN D-3 PO) Take 600 Units by mouth daily.     citalopram  (CELEXA ) 20 MG tablet Take 1 tablet (20 mg total) by mouth daily. 90 tablet 4   ferrous sulfate 325 (65 FE) MG tablet Take 325 mg by mouth daily with breakfast.     furosemide  (LASIX ) 40 MG tablet Take 1 tablet (40 mg total) by mouth daily. 90 tablet 3   Loperamide HCl (IMODIUM PO) Take by mouth. PRN     losartan  (COZAAR ) 25 MG tablet Take 1 tablet (25 mg total) by mouth daily. 90 tablet 3   Multiple Vitamin (MULTIVITAMIN) tablet Take 1 tablet by mouth daily.     No current facility-administered medications for this visit.   Facility-Administered Medications Ordered in Other Visits  Medication Dose Route Frequency Provider Last Rate Last Admin   Darbepoetin Alfa  (ARANESP ) injection 200 mcg  200 mcg Subcutaneous Once Mariyana Fulop J, MD        OBJECTIVE: Vitals:   07/17/24 1057  BP: 122/68  Pulse: 80  Resp: 18  Temp: 98.7 F (37.1 C)  SpO2: 100%     Body mass index is 28.51 kg/m.    ECOG FS:0 - Asymptomatic  General:  Well-developed, well-nourished, no acute distress. Eyes: Pink conjunctiva, anicteric sclera. HEENT: Normocephalic, moist mucous membranes. Lungs: No audible wheezing or coughing. Heart: Regular rate and rhythm. Abdomen: Soft, nontender, no obvious distention. Musculoskeletal: No edema, cyanosis, or clubbing. Neuro: Alert, answering all questions appropriately. Cranial nerves grossly intact. Skin: No rashes or petechiae noted. Psych: Normal affect.  LAB RESULTS:  Lab Results  Component Value Date   NA 146 (H) 03/23/2022   K 4.0 03/23/2022   CL 108 (H) 03/23/2022   CO2 19 (L) 03/23/2022   GLUCOSE 90 03/23/2022   BUN 37 (H) 03/23/2022   CREATININE 2.16 (H) 03/23/2022   CALCIUM  9.4 03/23/2022   PROT 7.4 01/09/2022   ALBUMIN 3.7 01/09/2022   AST  18 01/09/2022   ALT 14 01/09/2022   ALKPHOS 60 01/09/2022   BILITOT 0.2 (L) 01/09/2022   GFRNONAA 25 (L) 01/09/2022   GFRAA 36 (L) 01/03/2020    Lab Results  Component Value Date   WBC 6.8 07/17/2024   NEUTROABS 3.9 07/17/2024   HGB 9.1 (L) 07/17/2024   HCT 27.9 (L) 07/17/2024   MCV 88.9 07/17/2024   PLT 244 07/17/2024     STUDIES: No results found.   ASSESSMENT: Anemia secondary to chronic renal insufficiency.  PLAN:  Anemia secondary to chronic renal insufficiency: Previously, patient's iron  stores were reported within normal limits.  Her hemoglobin is decreasing and is now 9.1.  Proceed with 200 mcg Aranesp  today.  Return to clinic in 1, 2, 3 months for laboratory work and treatment if her hemoglobin remains below 10.0.  Patient would then return to clinic in 4 months for repeat laboratory, further evaluation, and continuation of treatment if needed. Renal insufficiency: Stage IV.  Patient reports nephrology has referred her for possible transplantation.  She only has 1 kidney after her nephrectomy for renal cell carcinoma.  Continue follow-up with nephrology as scheduled. History of stage III left renal cell carcinoma:  Patient underwent left nephrectomy on January 01, 2017.  She did not have any adjuvant treatment.  No evidence of disease.    I spent a total of 30 minutes reviewing chart data, face-to-face evaluation with the patient, counseling and coordination of care as detailed above.   Patient expressed understanding and was in agreement with this plan. She also understands that She can call clinic at any time with any questions, concerns, or complaints.    Cancer Staging  Renal cancer, left [facility]) Staging form: Kidney, AJCC 8th Edition - Clinical stage from 01/26/2017: Stage III (cT3a, cNX, cM0) - Signed by Jacobo Evalene PARAS, MD on 01/26/2017 Histologic grade (G): G2 Histologic grading system: 4 grade system Laterality: Left Histologic sub-type: Clear cell   Evalene PARAS Jacobo, MD   07/17/2024 11:11 AM

## 2024-07-18 ENCOUNTER — Other Ambulatory Visit (INDEPENDENT_AMBULATORY_CARE_PROVIDER_SITE_OTHER): Payer: Self-pay | Admitting: Nurse Practitioner

## 2024-07-18 DIAGNOSIS — N186 End stage renal disease: Secondary | ICD-10-CM

## 2024-07-20 ENCOUNTER — Encounter (INDEPENDENT_AMBULATORY_CARE_PROVIDER_SITE_OTHER): Payer: Self-pay | Admitting: Nurse Practitioner

## 2024-07-20 ENCOUNTER — Ambulatory Visit (INDEPENDENT_AMBULATORY_CARE_PROVIDER_SITE_OTHER): Payer: Self-pay | Admitting: Nurse Practitioner

## 2024-07-20 ENCOUNTER — Encounter: Payer: Self-pay | Admitting: Oncology

## 2024-07-20 ENCOUNTER — Other Ambulatory Visit (INDEPENDENT_AMBULATORY_CARE_PROVIDER_SITE_OTHER): Payer: Self-pay

## 2024-07-20 VITALS — BP 134/79 | HR 62 | Ht 66.0 in | Wt 180.1 lb

## 2024-07-20 DIAGNOSIS — E7800 Pure hypercholesterolemia, unspecified: Secondary | ICD-10-CM | POA: Diagnosis not present

## 2024-07-20 DIAGNOSIS — N186 End stage renal disease: Secondary | ICD-10-CM | POA: Diagnosis not present

## 2024-07-20 DIAGNOSIS — N184 Chronic kidney disease, stage 4 (severe): Secondary | ICD-10-CM

## 2024-07-24 NOTE — Progress Notes (Signed)
 Subjective:    Patient ID: Sarah [name], female    DOB: 1951/11/22, 73 y.o.   MRN: [id number] Chief Complaint  Patient presents with   Establish Care    The patient is seen for evaluation for dialysis access. The patient has chronic renal insufficiency stage V secondary to hypertension. The patient's most recent creatinine clearance is less than 20. The patient volume status has not yet become an issue. Patient's blood pressures been relatively well controlled. There are mild uremic symptoms which appear to be relatively well tolerated at this time.  The patient notes the kidney problem has been present for a long time and has been progressively getting worse.  The patient is followed by nephrology.    The patient is right-handed.  The patient has been considering the various methods of dialysis and wishes to proceed with hemodialysis and therefore creation of AV access is indicated.  No recent shortening of the patient's walking distance or new symptoms consistent with claudication.  No history of rest pain symptoms. No new ulcers or wounds of the lower extremities have occurred.  The patient denies amaurosis fugax or recent TIA symptoms. There are no recent neurological changes noted. There is no history of DVT, PE or superficial thrombophlebitis. No recent episodes of angina or shortness of breath documented.     Review of Systems  Musculoskeletal:  Positive for arthralgias.  Neurological:  Positive for weakness.  All other systems reviewed and are negative.      Objective:   Physical Exam Vitals reviewed.  Cardiovascular:     Rate and Rhythm: Normal rate.     Pulses: Normal pulses.  Pulmonary:     Effort: Pulmonary effort is normal.  Skin:    General: Skin is warm and dry.  Neurological:     Mental Status: She is alert and oriented to person, place, and time.  Psychiatric:        Mood and Affect: Mood normal.        Behavior: Behavior normal.        Thought  Content: Thought content normal.     BP 134/79   Pulse 62   Ht 5' 6 (1.676 m)   Wt 180 lb 2 oz (81.7 kg)   BMI 29.07 kg/m   Past Medical History:  Diagnosis Date   Abnormal mammogram, unspecified    Anemia    Aneurysm (HCC) 2004   cerebral x  4, [facility]   Anxiety    Arthritis    Benign neoplasm of breast 2012   Breast complaint 2010   Chronic kidney disease    Renal Cell Cancer   Hypertension    Obesity, unspecified    Personal history of tobacco use, presenting hazards to health    Renal cancer, left (HCC) 11/2016   Renal Cell with Left nephrectomy.   Special screening for malignant neoplasms, colon     Social History   Socioeconomic History   Marital status: Divorced    Spouse name: Not on file   Number of children: 1   Years of education: Not on file   Highest education level: GED or equivalent  Occupational History    Comment: full time    Occupation: retired  Tobacco Use   Smoking status: Former    Current packs/day: 0.00    Average packs/day: 0.5 packs/day for 54.8 years (27.4 ttl pk-yrs)    Types: Cigarettes    Start date: 74    Quit date: 10/2023  Years since quitting: 0.8   Smokeless tobacco: Never  Vaping Use   Vaping status: Never Used  Substance and Sexual Activity   Alcohol use: Not Currently   Drug use: No   Sexual activity: Yes  Other Topics Concern   Not on file  Social History Narrative   Not on file   Social Drivers of Health   Financial Resource Strain: Low Risk  (06/29/2024)   Overall Financial Resource Strain (CARDIA)    Difficulty of Paying Living Expenses: Not hard at all  Food Insecurity: No Food Insecurity (06/29/2024)   Hunger Vital Sign    Worried About Running Out of Food in the Last Year: Never true    Ran Out of Food in the Last Year: Never true  Transportation Needs: No Transportation Needs (06/29/2024)   PRAPARE - Administrator, Civil Service (Medical): No    Lack of Transportation  (Non-Medical): No  Physical Activity: Inactive (06/29/2024)   Exercise Vital Sign    Days of Exercise per Week: 0 days    Minutes of Exercise per Session: 0 min  Stress: No Stress Concern Present (06/29/2024)   [employer] of Occupational Health - Occupational Stress Questionnaire    Feeling of Stress: Not at all  Social Connections: Moderately Isolated (06/29/2024)   Social Connection and Isolation Panel    Frequency of Communication with Friends and Family: More than three times a week    Frequency of Social Gatherings with Friends and Family: Once a week    Attends Religious Services: More than 4 times per year    Active Member of [employer] or Organizations: No    Attends Banker Meetings: Never    Marital Status: Divorced  Catering manager Violence: Not At Risk (06/29/2024)   Humiliation, Afraid, Rape, and Kick questionnaire    Fear of Current or Ex-Partner: No    Emotionally Abused: No    Physically Abused: No    Sexually Abused: No    Past Surgical History:  Procedure Laterality Date   ABDOMINAL HYSTERECTOMY  1993   BREAST BIOPSY Right 2012   stereo bx showing a 6mm radial scar in an estimated 8 volume of tissue   BREAST BIOPSY Right Apr 09, 2005   Fibrocystic changes, ductal adenosis, microcalcifications, focal stromal/epithelial proliferation, possible fibroadenoma type proliferation   CEREBRAL ANEURYSM REPAIR  2004   COLONOSCOPY  2008   Dr. Chipper   EYE SURGERY     FOOT SURGERY Right 2003   LAPAROSCOPIC NEPHRECTOMY, HAND ASSISTED Left 01/01/2017   Procedure: HAND ASSISTED LAPAROSCOPIC NEPHRECTOMY;  Surgeon: Redell Lynwood Napoleon, MD;  Location: [facility] ORS;  Service: Urology;  Laterality: Left;   TONSILLECTOMY  1997    Family History  Problem Relation Age of Onset   Cancer Cousin        maternal first cousin with breast cancer   Cancer Other        colon and ovarian cancers, no relationship listed   Cancer Father        prostate   Hypertension Father     Hypertension Sister    Hypertension Brother    Hypertension Paternal Grandmother    Heart disease Paternal Grandmother    Hypertension Paternal Grandfather    Heart disease Paternal Grandfather    Breast cancer Neg Hx     Allergies  Allergen Reactions   Codeine Itching       Latest Ref Rng & Units 07/17/2024   10:33 AM 09/15/2023  10:21 AM 01/09/2022    9:09 AM  CBC  WBC 4.0 - 10.5 K/uL 6.8  7.7  6.6   Hemoglobin 12.0 - 15.0 g/dL 9.1  89.4  89.4   Hematocrit 36.0 - 46.0 % 27.9  31.1  31.9   Platelets 150 - 400 K/uL 244  295  258       CMP     Component Value Date/Time   NA 135 07/17/2024 1033   NA 146 (H) 03/23/2022 1413   K 3.7 07/17/2024 1033   CL 106 07/17/2024 1033   CO2 19 (L) 07/17/2024 1033   GLUCOSE 102 (H) 07/17/2024 1033   BUN 61 (H) 07/17/2024 1033   BUN 37 (H) 03/23/2022 1413   CREATININE 3.32 (H) 07/17/2024 1033   CALCIUM  8.7 (L) 07/17/2024 1033   PROT 7.2 07/17/2024 1033   PROT 6.6 06/27/2018 1112   ALBUMIN 3.7 07/17/2024 1033   ALBUMIN 3.9 06/27/2018 1112   AST 16 07/17/2024 1033   ALT 9 07/17/2024 1033   ALKPHOS 58 07/17/2024 1033   BILITOT 0.5 07/17/2024 1033   EGFR 24 (L) 03/23/2022 1413   GFRNONAA 14 (L) 07/17/2024 1033     No results found.     Assessment & Plan:   1. CKD (chronic kidney disease) stage 4, GFR 15-29 ml/min (HCC) (Primary) Recommend:  At this time the patient does not have appropriate extremity access for dialysis  Patient should have a left radiocephalic AV fistula created.  The risks, benefits and alternative therapies were reviewed in detail with the patient.  All questions were answered.  The patient agrees to proceed with surgery.   The patient will follow up with me in the office after the surgery.  2. Pure hypercholesterolemia Continue statin as ordered and reviewed, no changes at this time   Current Outpatient Medications on File Prior to Visit  Medication Sig Dispense Refill   acetaminophen   (TYLENOL ) 325 MG tablet Take 650 mg by mouth every 6 (six) hours as needed for mild pain, moderate pain or headache.      amLODipine  (NORVASC ) 10 MG tablet Take 10 mg by mouth daily.     atenolol  (TENORMIN ) 100 MG tablet Take 1 tablet (100 mg total) by mouth daily. 90 tablet 4   atorvastatin  (LIPITOR) 40 MG tablet Take 1 tablet (40 mg total) by mouth daily. 90 tablet 3   calcitRIOL  (ROCALTROL ) 0.25 MCG capsule Take 1 capsule (0.25 mcg total) by mouth daily. 30 capsule 11   Cholecalciferol (VITAMIN D-3 PO) Take 600 Units by mouth daily.     citalopram  (CELEXA ) 20 MG tablet Take 1 tablet (20 mg total) by mouth daily. 90 tablet 4   ferrous sulfate 325 (65 FE) MG tablet Take 325 mg by mouth daily with breakfast.     furosemide  (LASIX ) 40 MG tablet Take 1 tablet (40 mg total) by mouth daily. 90 tablet 3   Loperamide HCl (IMODIUM PO) Take by mouth. PRN     losartan  (COZAAR ) 25 MG tablet Take 1 tablet (25 mg total) by mouth daily. 90 tablet 3   Multiple Vitamin (MULTIVITAMIN) tablet Take 1 tablet by mouth daily.     No current facility-administered medications on file prior to visit.    There are no Patient Instructions on file for this visit. No follow-ups on file.   Anda Sobotta E Clifton Safley, NP

## 2024-07-24 NOTE — H&P (View-Only) (Signed)
 Subjective:    Patient ID: Sarah [name], female    DOB: 1951/11/22, 73 y.o.   MRN: [id number] Chief Complaint  Patient presents with   Establish Care    The patient is seen for evaluation for dialysis access. The patient has chronic renal insufficiency stage V secondary to hypertension. The patient's most recent creatinine clearance is less than 20. The patient volume status has not yet become an issue. Patient's blood pressures been relatively well controlled. There are mild uremic symptoms which appear to be relatively well tolerated at this time.  The patient notes the kidney problem has been present for a long time and has been progressively getting worse.  The patient is followed by nephrology.    The patient is right-handed.  The patient has been considering the various methods of dialysis and wishes to proceed with hemodialysis and therefore creation of AV access is indicated.  No recent shortening of the patient's walking distance or new symptoms consistent with claudication.  No history of rest pain symptoms. No new ulcers or wounds of the lower extremities have occurred.  The patient denies amaurosis fugax or recent TIA symptoms. There are no recent neurological changes noted. There is no history of DVT, PE or superficial thrombophlebitis. No recent episodes of angina or shortness of breath documented.     Review of Systems  Musculoskeletal:  Positive for arthralgias.  Neurological:  Positive for weakness.  All other systems reviewed and are negative.      Objective:   Physical Exam Vitals reviewed.  Cardiovascular:     Rate and Rhythm: Normal rate.     Pulses: Normal pulses.  Pulmonary:     Effort: Pulmonary effort is normal.  Skin:    General: Skin is warm and dry.  Neurological:     Mental Status: She is alert and oriented to person, place, and time.  Psychiatric:        Mood and Affect: Mood normal.        Behavior: Behavior normal.        Thought  Content: Thought content normal.     BP 134/79   Pulse 62   Ht 5' 6 (1.676 m)   Wt 180 lb 2 oz (81.7 kg)   BMI 29.07 kg/m   Past Medical History:  Diagnosis Date   Abnormal mammogram, unspecified    Anemia    Aneurysm (HCC) 2004   cerebral x  4, [facility]   Anxiety    Arthritis    Benign neoplasm of breast 2012   Breast complaint 2010   Chronic kidney disease    Renal Cell Cancer   Hypertension    Obesity, unspecified    Personal history of tobacco use, presenting hazards to health    Renal cancer, left (HCC) 11/2016   Renal Cell with Left nephrectomy.   Special screening for malignant neoplasms, colon     Social History   Socioeconomic History   Marital status: Divorced    Spouse name: Not on file   Number of children: 1   Years of education: Not on file   Highest education level: GED or equivalent  Occupational History    Comment: full time    Occupation: retired  Tobacco Use   Smoking status: Former    Current packs/day: 0.00    Average packs/day: 0.5 packs/day for 54.8 years (27.4 ttl pk-yrs)    Types: Cigarettes    Start date: 74    Quit date: 10/2023  Years since quitting: 0.8   Smokeless tobacco: Never  Vaping Use   Vaping status: Never Used  Substance and Sexual Activity   Alcohol use: Not Currently   Drug use: No   Sexual activity: Yes  Other Topics Concern   Not on file  Social History Narrative   Not on file   Social Drivers of Health   Financial Resource Strain: Low Risk  (06/29/2024)   Overall Financial Resource Strain (CARDIA)    Difficulty of Paying Living Expenses: Not hard at all  Food Insecurity: No Food Insecurity (06/29/2024)   Hunger Vital Sign    Worried About Running Out of Food in the Last Year: Never true    Ran Out of Food in the Last Year: Never true  Transportation Needs: No Transportation Needs (06/29/2024)   PRAPARE - Administrator, Civil Service (Medical): No    Lack of Transportation  (Non-Medical): No  Physical Activity: Inactive (06/29/2024)   Exercise Vital Sign    Days of Exercise per Week: 0 days    Minutes of Exercise per Session: 0 min  Stress: No Stress Concern Present (06/29/2024)   [employer] of Occupational Health - Occupational Stress Questionnaire    Feeling of Stress: Not at all  Social Connections: Moderately Isolated (06/29/2024)   Social Connection and Isolation Panel    Frequency of Communication with Friends and Family: More than three times a week    Frequency of Social Gatherings with Friends and Family: Once a week    Attends Religious Services: More than 4 times per year    Active Member of [employer] or Organizations: No    Attends Banker Meetings: Never    Marital Status: Divorced  Catering manager Violence: Not At Risk (06/29/2024)   Humiliation, Afraid, Rape, and Kick questionnaire    Fear of Current or Ex-Partner: No    Emotionally Abused: No    Physically Abused: No    Sexually Abused: No    Past Surgical History:  Procedure Laterality Date   ABDOMINAL HYSTERECTOMY  1993   BREAST BIOPSY Right 2012   stereo bx showing a 6mm radial scar in an estimated 8 volume of tissue   BREAST BIOPSY Right Apr 09, 2005   Fibrocystic changes, ductal adenosis, microcalcifications, focal stromal/epithelial proliferation, possible fibroadenoma type proliferation   CEREBRAL ANEURYSM REPAIR  2004   COLONOSCOPY  2008   Dr. Chipper   EYE SURGERY     FOOT SURGERY Right 2003   LAPAROSCOPIC NEPHRECTOMY, HAND ASSISTED Left 01/01/2017   Procedure: HAND ASSISTED LAPAROSCOPIC NEPHRECTOMY;  Surgeon: Redell Lynwood Napoleon, MD;  Location: [facility];  Service: Urology;  Laterality: Left;   TONSILLECTOMY  1997    Family History  Problem Relation Age of Onset   Cancer Cousin        maternal first cousin with breast cancer   Cancer Other        colon and ovarian cancers, no relationship listed   Cancer Father        prostate   Hypertension Father     Hypertension Sister    Hypertension Brother    Hypertension Paternal Grandmother    Heart disease Paternal Grandmother    Hypertension Paternal Grandfather    Heart disease Paternal Grandfather    Breast cancer Neg Hx     Allergies  Allergen Reactions   Codeine Itching       Latest Ref Rng & Units 07/17/2024   10:33 AM 09/15/2023  10:21 AM 01/09/2022    9:09 AM  CBC  WBC 4.0 - 10.5 K/uL 6.8  7.7  6.6   Hemoglobin 12.0 - 15.0 g/dL 9.1  89.4  89.4   Hematocrit 36.0 - 46.0 % 27.9  31.1  31.9   Platelets 150 - 400 K/uL 244  295  258       CMP     Component Value Date/Time   NA 135 07/17/2024 1033   NA 146 (H) 03/23/2022 1413   K 3.7 07/17/2024 1033   CL 106 07/17/2024 1033   CO2 19 (L) 07/17/2024 1033   GLUCOSE 102 (H) 07/17/2024 1033   BUN 61 (H) 07/17/2024 1033   BUN 37 (H) 03/23/2022 1413   CREATININE 3.32 (H) 07/17/2024 1033   CALCIUM  8.7 (L) 07/17/2024 1033   PROT 7.2 07/17/2024 1033   PROT 6.6 06/27/2018 1112   ALBUMIN 3.7 07/17/2024 1033   ALBUMIN 3.9 06/27/2018 1112   AST 16 07/17/2024 1033   ALT 9 07/17/2024 1033   ALKPHOS 58 07/17/2024 1033   BILITOT 0.5 07/17/2024 1033   EGFR 24 (L) 03/23/2022 1413   GFRNONAA 14 (L) 07/17/2024 1033     No results found.     Assessment & Plan:   1. CKD (chronic kidney disease) stage 4, GFR 15-29 ml/min (HCC) (Primary) Recommend:  At this time the patient does not have appropriate extremity access for dialysis  Patient should have a left radiocephalic AV fistula created.  The risks, benefits and alternative therapies were reviewed in detail with the patient.  All questions were answered.  The patient agrees to proceed with surgery.   The patient will follow up with me in the office after the surgery.  2. Pure hypercholesterolemia Continue statin as ordered and reviewed, no changes at this time   Current Outpatient Medications on File Prior to Visit  Medication Sig Dispense Refill   acetaminophen   (TYLENOL ) 325 MG tablet Take 650 mg by mouth every 6 (six) hours as needed for mild pain, moderate pain or headache.      amLODipine  (NORVASC ) 10 MG tablet Take 10 mg by mouth daily.     atenolol  (TENORMIN ) 100 MG tablet Take 1 tablet (100 mg total) by mouth daily. 90 tablet 4   atorvastatin  (LIPITOR) 40 MG tablet Take 1 tablet (40 mg total) by mouth daily. 90 tablet 3   calcitRIOL  (ROCALTROL ) 0.25 MCG capsule Take 1 capsule (0.25 mcg total) by mouth daily. 30 capsule 11   Cholecalciferol (VITAMIN D-3 PO) Take 600 Units by mouth daily.     citalopram  (CELEXA ) 20 MG tablet Take 1 tablet (20 mg total) by mouth daily. 90 tablet 4   ferrous sulfate 325 (65 FE) MG tablet Take 325 mg by mouth daily with breakfast.     furosemide  (LASIX ) 40 MG tablet Take 1 tablet (40 mg total) by mouth daily. 90 tablet 3   Loperamide HCl (IMODIUM PO) Take by mouth. PRN     losartan  (COZAAR ) 25 MG tablet Take 1 tablet (25 mg total) by mouth daily. 90 tablet 3   Multiple Vitamin (MULTIVITAMIN) tablet Take 1 tablet by mouth daily.     No current facility-administered medications on file prior to visit.    There are no Patient Instructions on file for this visit. No follow-ups on file.   Anda Sobotta E Clifton Safley, NP

## 2024-07-25 ENCOUNTER — Encounter: Payer: Self-pay | Admitting: Acute Care

## 2024-08-02 ENCOUNTER — Telehealth (INDEPENDENT_AMBULATORY_CARE_PROVIDER_SITE_OTHER): Payer: Self-pay

## 2024-08-02 NOTE — Telephone Encounter (Signed)
 I attempted to contact the patient to schedule her for a left radialcephalic AV fistula with Dr. Jama. I was unable to leave a message as the voicemail box was full. I then left messages with the patient's daughter and sister to have the patient return my call.

## 2024-08-02 NOTE — Telephone Encounter (Signed)
 Patient called back and is scheduled with Dr. Jama at [facility] for a left radialcephalic AV fistula. Pre-admit will call to schedule pre-op  at the [facility]. Pre-surgical instructions were discussed and will be mailed to the patient as the patient stated she does not use Mychart.

## 2024-08-07 ENCOUNTER — Telehealth (INDEPENDENT_AMBULATORY_CARE_PROVIDER_SITE_OTHER): Payer: Self-pay

## 2024-08-07 NOTE — Telephone Encounter (Signed)
 I called the patient as her surgical information was returned today stating undeliverable for wrong address. Per the patient the address in the system is correct and she will pick up the information in our office and clarify her home address as well.

## 2024-08-09 ENCOUNTER — Encounter: Payer: Self-pay | Admitting: Vascular Surgery

## 2024-08-09 ENCOUNTER — Encounter
Admission: RE | Admit: 2024-08-09 | Discharge: 2024-08-09 | Disposition: A | Discharge status: Home / Self Care | Source: Ambulatory Visit | Attending: Vascular Surgery | Admitting: Vascular Surgery

## 2024-08-09 ENCOUNTER — Other Ambulatory Visit (INDEPENDENT_AMBULATORY_CARE_PROVIDER_SITE_OTHER): Payer: Self-pay | Admitting: Nurse Practitioner

## 2024-08-09 ENCOUNTER — Other Ambulatory Visit: Payer: Self-pay

## 2024-08-09 ENCOUNTER — Telehealth: Payer: Self-pay

## 2024-08-09 VITALS — BP 135/72 | HR 64 | Resp 16

## 2024-08-09 DIAGNOSIS — D631 Anemia in chronic kidney disease: Secondary | ICD-10-CM | POA: Insufficient documentation

## 2024-08-09 DIAGNOSIS — Z01812 Encounter for preprocedural laboratory examination: Secondary | ICD-10-CM | POA: Insufficient documentation

## 2024-08-09 DIAGNOSIS — N185 Chronic kidney disease, stage 5: Secondary | ICD-10-CM | POA: Insufficient documentation

## 2024-08-09 DIAGNOSIS — N289 Disorder of kidney and ureter, unspecified: Secondary | ICD-10-CM | POA: Insufficient documentation

## 2024-08-09 DIAGNOSIS — Z01818 Encounter for other preprocedural examination: Secondary | ICD-10-CM | POA: Diagnosis not present

## 2024-08-09 DIAGNOSIS — N186 End stage renal disease: Secondary | ICD-10-CM

## 2024-08-09 HISTORY — DX: Pneumonia, unspecified organism: J18.9

## 2024-08-09 HISTORY — DX: Atherosclerosis of aorta: I70.0

## 2024-08-09 HISTORY — DX: Headache, unspecified: R51.9

## 2024-08-09 HISTORY — DX: Dyspnea, unspecified: R06.00

## 2024-08-09 LAB — CBC
HCT: 32 % — ABNORMAL LOW (ref 36.0–46.0)
Hemoglobin: 10.8 g/dL — ABNORMAL LOW (ref 12.0–15.0)
MCH: 29.4 pg (ref 26.0–34.0)
MCHC: 33.8 g/dL (ref 30.0–36.0)
MCV: 87.2 fL (ref 80.0–100.0)
Platelets: 251 K/uL (ref 150–400)
RBC: 3.67 MIL/uL — ABNORMAL LOW (ref 3.87–5.11)
RDW: 15.7 % — ABNORMAL HIGH (ref 11.5–15.5)
WBC: 5.3 K/uL (ref 4.0–10.5)
nRBC: 0 % (ref 0.0–0.2)

## 2024-08-09 LAB — TYPE AND SCREEN
ABO/RH(D): A POS
Antibody Screen: NEGATIVE

## 2024-08-09 LAB — BASIC METABOLIC PANEL WITH GFR
Anion gap: 15 (ref 5–15)
BUN: 85 mg/dL — ABNORMAL HIGH (ref 8–23)
CO2: 18 mmol/L — ABNORMAL LOW (ref 22–32)
Calcium: 9.2 mg/dL (ref 8.9–10.3)
Chloride: 103 mmol/L (ref 98–111)
Creatinine, Ser: 4.1 mg/dL — ABNORMAL HIGH (ref 0.44–1.00)
GFR, Estimated: 11 mL/min — ABNORMAL LOW
Glucose, Bld: 120 mg/dL — ABNORMAL HIGH (ref 70–99)
Potassium: 3.6 mmol/L (ref 3.5–5.1)
Sodium: 136 mmol/L (ref 135–145)

## 2024-08-09 NOTE — Patient Instructions (Addendum)
 Your procedure is scheduled on: 08/16/24 - Wednesday Report to the Registration Desk on the 1st floor of the [facility]. To find out your arrival time, please call [phone] between 1PM - 3PM on: 09/16 25 - Tuesday If your arrival time is 6:00 am, do not arrive before that time as the [facility] entrance doors do not open until 6:00 am.  REMEMBER: Instructions that are not followed completely may result in serious medical risk, up to and including death; or upon the discretion of your surgeon and anesthesiologist your surgery may need to be rescheduled.  Do not eat food or drink any liquids after midnight the night before surgery.  No gum chewing or hard candies.  One week prior to surgery: Stop Anti-inflammatories (NSAIDS) such as Advil , Aleve, Ibuprofen , Motrin , Naproxen, Naprosyn and Aspirin based products such as Excedrin, Goody's Powder, BC Powder. You may continue to take Tylenol  if needed for pain up until the day of surgery.  Stop ANY OVER THE COUNTER supplements until after surgery : Multivitamin, Vitamin D3,    Hold furosemide  (LASIX ) and losartan  (COZAAR ) on the morning of procedure.  ON THE DAY OF SURGERY ONLY TAKE THESE MEDICATIONS WITH SIPS OF WATER:  amLODipine  (NORVASC )  atenolol  (TENORMIN )  atorvastatin  (LIPITOR)  citalopram  (CELEXA )   No Alcohol for 24 hours before or after surgery.  No Smoking including e-cigarettes for 24 hours before surgery.  No chewable tobacco products for at least 6 hours before surgery.  No nicotine patches on the day of surgery.  Do not use any recreational drugs for at least a week (preferably 2 weeks) before your surgery.  Please be advised that the combination of cocaine and anesthesia may have negative outcomes, up to and including death. If you test positive for cocaine, your surgery will be cancelled.  On the morning of surgery brush your teeth with toothpaste and water, you may rinse your mouth with mouthwash if you  wish. Do not swallow any toothpaste or mouthwash.  Your procedure is scheduled on: Report to the Registration Desk on the 1st floor of the [facility]. To find out your arrival time, please call [phone] between 1PM - 3PM on: If your arrival time is 6:00 am, do not arrive before that time as the [facility] entrance doors do not open until 6:00 am.  REMEMBER: Instructions that are not followed completely may result in serious medical risk, up to and including death; or upon the discretion of your surgeon and anesthesiologist your surgery may need to be rescheduled.  Do not eat food after midnight the night before surgery.  No gum chewing or hard candies.  You may however, drink CLEAR liquids up to 2 hours before you are scheduled to arrive for your surgery. Do not drink anything within 2 hours of your scheduled arrival time.  Clear liquids include: - water  - apple juice without pulp - gatorade (not RED colors) - black coffee or tea (Do NOT add milk or creamers to the coffee or tea) Do NOT drink anything that is not on this list.  **Type 1 and Type 2 diabetics should only drink water.**  In addition, your doctor has ordered for you to drink the provided:  Ensure Pre-Surgery Clear Carbohydrate Drink  Gatorade G2 Drinking this carbohydrate drink up to two hours before surgery helps to reduce insulin resistance and improve patient outcomes. Please complete drinking 2 hours before scheduled arrival time.  One week prior to surgery: Stop Anti-inflammatories (NSAIDS) such  as Advil , Aleve, Ibuprofen , Motrin , Naproxen, Naprosyn and Aspirin based products such as Excedrin, Goody's Powder, BC Powder. Stop ANY OVER THE COUNTER supplements until after surgery.  You may however, continue to take Tylenol  if needed for pain up until the day of surgery.  **Follow guidelines for insulin and diabetes medications.**  **Follow recommendations regarding stopping blood thinners.**  Continue  taking all of your other prescription medications up until the day of surgery.  ON THE DAY OF SURGERY ONLY TAKE THESE MEDICATIONS WITH SIPS OF WATER:    Use inhalers on the day of surgery and bring to the hospital.  Fleets enema or bowel prep as directed.  No Alcohol for 24 hours before or after surgery.  No Smoking including e-cigarettes for 24 hours before surgery.  No chewable tobacco products for at least 6 hours before surgery.  No nicotine patches on the day of surgery.  Do not use any recreational drugs for at least a week (preferably 2 weeks) before your surgery.  Please be advised that the combination of cocaine and anesthesia may have negative outcomes, up to and including death. If you test positive for cocaine, your surgery will be cancelled.  On the morning of surgery brush your teeth with toothpaste and water, you may rinse your mouth with mouthwash if you wish. Do not swallow any toothpaste or mouthwash.  Use CHG Soap or wipes as directed on instruction sheet.  Do not wear jewelry, make-up, hairpins, clips or nail polish.  For welded (permanent) jewelry: bracelets, anklets, waist bands, etc.  Please have this removed prior to surgery.  If it is not removed, there is a chance that hospital personnel will need to cut it off on the day of surgery.  Do not wear lotions, powders, or perfumes.   Do not shave body hair from the neck down 48 hours before surgery.  Contact lenses, hearing aids and dentures may not be worn into surgery.  Do not bring valuables to the hospital. [facility] is not responsible for any missing/lost belongings or valuables.   Total Shoulder Arthroplasty:  use Benzoyl Peroxide 5% Gel as directed on instruction sheet.  Bring your C-PAP to the hospital in case you may have to spend the night.   Notify your doctor if there is any change in your medical condition (cold, fever, infection).  Wear comfortable clothing (specific to your surgery  type) to the hospital.  After surgery, you can help prevent lung complications by doing breathing exercises.  Take deep breaths and cough every 1-2 hours. Your doctor may order a device called an Incentive Spirometer to help you take deep breaths. When coughing or sneezing, hold a pillow firmly against your incision with both hands. This is called "splinting." Doing this helps protect your incision. It also decreases belly discomfort.  If you are being admitted to the hospital overnight, leave your suitcase in the car. After surgery it may be brought to your room.  In case of increased patient census, it may be necessary for you, the patient, to continue your postoperative care in the Same Day Surgery department.  If you are being discharged the day of surgery, you will not be allowed to drive home. You will need a responsible individual to drive you home and stay with you for 24 hours after surgery.   If you are taking public transportation, you will need to have a responsible individual with you.  Please call the Pre-admissions Testing Dept. at [phone] if you have  any questions about these instructions.  Surgery Visitation Policy:  Patients having surgery or a procedure may have two visitors.  Children under the age of 87 must have an adult with them who is not the patient.  Inpatient Visitation:    Visiting hours are 7 a.m. to 8 p.m. Up to four visitors are allowed at one time in a patient room. The visitors may rotate out with other people during the day.  One visitor age 32 or older may stay with the patient overnight and must be in the room by 8 p.m.   Merchandiser, retail to address health-related social needs:  [email or web address] CHG Soap or wipes as directed on instruction sheet.  Do not wear jewelry, make-up, hairpins, clips or nail polish.  For welded (permanent) jewelry: bracelets, anklets, waist bands, etc.  Please have this removed prior  to surgery.  If it is not removed, there is a chance that hospital personnel will need to cut it off on the day of surgery.  Do not wear lotions, powders, or perfumes.   Do not shave body hair from the neck down 48 hours before surgery.  Contact lenses, hearing aids and dentures may not be worn into surgery.  Do not bring valuables to the hospital. [facility] is not responsible for any missing/lost belongings or valuables.   Notify your doctor if there is any change in your medical condition (cold, fever, infection).  Wear comfortable clothing (specific to your surgery type) to the hospital.  After surgery, you can help prevent lung complications by doing breathing exercises.  Take deep breaths and cough every 1-2 hours. Your doctor may order a device called an Incentive Spirometer to help you take deep breaths.  When coughing or sneezing, hold a pillow firmly against your incision with both hands. This is called "splinting." Doing this helps protect your incision. It also decreases belly discomfort.  If you are being admitted to the hospital overnight, leave your suitcase in the car. After surgery it may be brought to your room.  In case of increased patient census, it may be necessary for you, the patient, to continue your postoperative care in the Same Day Surgery department.  If you are being discharged the day of surgery, you will not be allowed to drive home. You will need a responsible individual to drive you home and stay with you for 24 hours after surgery.   If you are taking public transportation, you will need to have a responsible individual with you.  Please call the Pre-admissions Testing Dept. at [phone] if you have any questions about these instructions.  Surgery Visitation Policy:  Patients having surgery or a procedure may have two visitors.  Children under the age of 89 must have an adult with them who is not the patient.  Inpatient Visitation:     Visiting hours are 7 a.m. to 8 p.m. Up to four visitors are allowed at one time in a patient room. The visitors may rotate out with other people during the day.  One visitor age 6 or older may stay with the patient overnight and must be in the room by 8 p.m.   Merchandiser, retail to address health-related social needs:  [email or web address]  Preparing for Surgery with CHLORHEXIDINE GLUCONATE (CHG) Soap  Chlorhexidine Gluconate (CHG) Soap  o An antiseptic cleaner that kills germs and bonds with the skin to continue killing germs even after washing  o Used for showering the night before surgery and morning of surgery  Before surgery, you can play an important role by reducing the number of germs on your skin.  CHG (Chlorhexidine gluconate) soap is an antiseptic cleanser which kills germs and bonds with the skin to continue killing germs even after washing.  Please do not use if you have an allergy to CHG or antibacterial soaps. If your skin becomes reddened/irritated stop using the CHG.  1. Shower the NIGHT BEFORE SURGERY and the MORNING OF SURGERY with CHG soap.  2. If you choose to wash your hair, wash your hair first as usual with your normal shampoo.  3. After shampooing, rinse your hair and body thoroughly to remove the shampoo.  4. Use CHG as you would any other liquid soap. You can apply CHG directly to the skin and wash gently with a scrungie or a clean washcloth.  5. Apply the CHG soap to your body only from the neck down. Do not use on open wounds or open sores. Avoid contact with your eyes, ears, mouth, and genitals (private parts). Wash face and genitals (private parts) with your normal soap.  6. Wash thoroughly, paying special attention to the area where your surgery will be performed.  7. Thoroughly rinse your body with warm water.  8.  Do not shower/wash with your normal soap after using and rinsing off the CHG soap.  9. Pat yourself dry with a clean towel.  10. Wear clean pajamas to bed the night before surgery.  12. Place clean sheets on your bed the night of your first shower and do not sleep with pets.  13. Shower again with the CHG soap on the day of surgery prior to arriving at the hospital.  14. Do not apply any deodorants/lotions/powders.  15. Please wear clean clothes to the hospital.

## 2024-08-09 NOTE — Telephone Encounter (Signed)
   Pre-operative Risk Assessment    Patient Name: Sarah [name]  DOB: 05/02/1951 MRN: [id number]   Date of last office visit: 04/17/24 Sarah LUNGER, MD Date of next office visit: NONE   Request for Surgical Clearance    Procedure:  ARTERIOVENOUS (AV) FISTULA CREATION  Date of Surgery:  Clearance 08/16/24                                Surgeon:  Dr. Cordella Shawl  Surgeon's Group or Practice Name:  [facility]  Phone number:  [phone]  Fax number:  [phone] (In efforts to conserve resources (paper), return FAX not required. I will follow up in [facility]).    Type of Clearance Requested:   - Medical    Type of Anesthesia:  General    Additional requests/questions:    Signed, Sarah [name]   08/09/2024, 5:36 PM     Sarah Sarah BRAVO, NP  P Cv Div Preop Callback Request for pre-operative cardiac clearance:   1. What type of surgery is being performed? ARTERIOVENOUS (AV) FISTULA CREATION  2. When is this surgery scheduled? 08/16/2024   3. Type of clearance being requested (medical, pharmacy, both)? MEDICAL   4. Are there any medications that need to be held prior to surgery? NONE  5. Practice name and name of physician performing surgery? Performing surgeon: Dr. Cordella Shawl, MD Requesting clearance: Sarah Elnor, Sarah [name]     6. Anesthesia type (none, local, MAC, general)? GENERAL  7. What is the office phone and fax number?   Phone: [phone] Fax: [phone] (In efforts to conserve resources (paper), return FAX not required. I will follow up in [facility]).  ATTENTION: Unable to create telephone message as per your standard workflow. Directed by [facility] providers to send requests for cardiac clearance to this pool for appropriate distribution to provider covering pre-operative clearances.  Sarah Elnor, Sarah [name], Sarah [name], Sarah [name], Sarah [name] [facility] Peri-operative Services Nurse Practitioner Phone: [phone] 08/09/24 5:24 PM

## 2024-08-09 NOTE — Telephone Encounter (Signed)
-----   Message from Dorise CHARLENA Pereyra sent at 08/09/2024  5:24 PM EDT ----- Regarding: Request for pre-operative cardiac clearance Request for pre-operative cardiac clearance:  1. What type of surgery is being performed?  ARTERIOVENOUS (AV) FISTULA CREATION  2. When is this surgery scheduled?  08/16/2024  3. Type of clearance being requested (medical, pharmacy, both)? MEDICAL   4. Are there any medications that need to be held prior to surgery? NONE  5. Practice name and name of physician performing surgery?  Performing surgeon: Dr. Cordella Shawl, MD Requesting clearance: Dorise Pereyra, FNP-C    6. Anesthesia type (none, local, MAC, general)? GENERAL  7. What is the office phone and fax number?   Phone: [phone] Fax: [phone] (In efforts to conserve resources (paper), return FAX not required. I will follow up in [facility]).  ATTENTION: Unable to create telephone message as per your standard workflow. Directed by [facility] providers to send requests for cardiac clearance to this pool for appropriate distribution to provider covering pre-operative clearances.   Dorise Pereyra, MSN, APRN, FNP-C, CEN [facility]  Peri-operative Services Nurse Practitioner Phone: [phone] 08/09/24 5:24 PM

## 2024-08-10 ENCOUNTER — Telehealth: Payer: Self-pay

## 2024-08-10 NOTE — Telephone Encounter (Signed)
   Name: Sarah [name]  DOB: 1951-03-12  MRN: [id number]  Primary Cardiologist: None  Chart reviewed as part of pre-operative protocol coverage. Because of Sarah [name]'s past medical history and time since last visit, she will require a follow-up telephone visit in order to better assess preoperative cardiovascular risk.  Pre-op  covering staff: - Please schedule appointment and call patient to inform them. If patient already had an upcoming appointment within acceptable timeframe, please add pre-op  clearance to the appointment notes so provider is aware. - Please contact requesting surgeon's office via preferred method (i.e, phone, fax) to inform them of need for appointment prior to surgery.  No medications need held.   Sarah LOISE Fabry, PA-C  08/10/2024, 8:29 AM

## 2024-08-10 NOTE — Telephone Encounter (Signed)
 Med Rec and Consent done.. sam (approved by [facility])     Patient Consent for Virtual Visit        Sarah [name] has provided verbal consent on 08/10/2024 for a virtual visit (video or telephone).   CONSENT FOR VIRTUAL VISIT FOR:  Sarah [name]  By participating in this virtual visit I agree to the following:  I hereby voluntarily request, consent and authorize [facility] and its employed or contracted physicians, physician assistants, nurse practitioners or other licensed health care professionals (the Practitioner), to provide me with telemedicine health care services (the "Services) as deemed necessary by the treating Practitioner. I acknowledge and consent to receive the Services by the Practitioner via telemedicine. I understand that the telemedicine visit will involve communicating with the Practitioner through live audiovisual communication technology and the disclosure of certain medical information by electronic transmission. I acknowledge that I have been given the opportunity to request an in-person assessment or other available alternative prior to the telemedicine visit and am voluntarily participating in the telemedicine visit.  I understand that I have the right to withhold or withdraw my consent to the use of telemedicine in the course of my care at any time, without affecting my right to future care or treatment, and that the Practitioner or I may terminate the telemedicine visit at any time. I understand that I have the right to inspect all information obtained and/or recorded in the course of the telemedicine visit and may receive copies of available information for a reasonable fee.  I understand that some of the potential risks of receiving the Services via telemedicine include:  Delay or interruption in medical evaluation due to technological equipment failure or disruption; Information transmitted may not be sufficient (e.g. poor resolution of images) to allow for  appropriate medical decision making by the Practitioner; and/or  In rare instances, security protocols could fail, causing a breach of personal health information.  Furthermore, I acknowledge that it is my responsibility to provide information about my medical history, conditions and care that is complete and accurate to the best of my ability. I acknowledge that Practitioner's advice, recommendations, and/or decision may be based on factors not within their control, such as incomplete or inaccurate data provided by me or distortions of diagnostic images or specimens that may result from electronic transmissions. I understand that the practice of medicine is not an exact science and that Practitioner makes no warranties or guarantees regarding treatment outcomes. I acknowledge that a copy of this consent can be made available to me via my patient portal [facility] MyChart), or I can request a printed copy by calling the office of  [facility].    I understand that my insurance will be billed for this visit.   I have read or had this consent read to me. I understand the contents of this consent, which adequately explains the benefits and risks of the Services being provided via telemedicine.  I have been provided ample opportunity to ask questions regarding this consent and the Services and have had my questions answered to my satisfaction. I give my informed consent for the services to be provided through the use of telemedicine in my medical care

## 2024-08-10 NOTE — Telephone Encounter (Signed)
 S/W pt and scheduled TELE preop appt 08/14/24.  Med Rec and Consent done.. sam (approved by [facility])   Will update the surgeons office

## 2024-08-14 ENCOUNTER — Ambulatory Visit: Attending: Cardiology

## 2024-08-14 ENCOUNTER — Encounter: Payer: Self-pay | Admitting: Vascular Surgery

## 2024-08-14 DIAGNOSIS — Z01810 Encounter for preprocedural cardiovascular examination: Secondary | ICD-10-CM

## 2024-08-14 NOTE — Progress Notes (Signed)
   Patient Name: Sarah [name]  DOB: July 08, 1951 MRN: [id number]  Primary Cardiologist: None  Patient was contacted today for scheduled televisit for preoperative cardiac evaluation.  Patient was called 4 times during her allotted time.  She was also called twice after scheduled visits.  Unable to reach patient via provided phone number, her voicemail box was full.  Will send back to preoperative pool as patient will need to be rescheduled.  Makya Phillis D Jadiel Schmieder, NP 08/14/2024, 5:11 PM

## 2024-08-14 NOTE — Progress Notes (Signed)
 Perioperative / Anesthesia Services  Pre-Admission Testing Clinical Review / Pre-Operative Anesthesia Consult  Date: 08/14/24  PATIENT DEMOGRAPHICS: Name: Sarah [name] DOB: 06-Dec-1950 MRN:   [id number]  Note: Available PAT nursing documentation and vital signs have been reviewed. Clinical nursing staff has updated patient's PMH/PSHx, current medication list, and drug allergies/intolerances to ensure complete and comprehensive history available to assist care teams in MDM as it pertains to the aforementioned surgical procedure and anticipated anesthetic course. Extensive review of available clinical information personally performed. [place] PMH and PSHx updated with any diagnoses/procedures that  may have been inadvertently omitted during her intake with the pre-admission testing department's nursing staff.  PLANNED SURGICAL PROCEDURE(S):   Case: [id number] Date/Time: 08/16/24 0715   Procedure: ARTERIOVENOUS (AV) FISTULA CREATION (Left) - RADIALCEPHALIC   Anesthesia type: General   Diagnosis: ESRD (end stage renal disease) (HCC) [N18.6]   Pre-op  diagnosis: ESRD   Location: [facility] OR ROOM 08 / [facility] ORS FOR ANESTHESIA GROUP   Surgeons: Jama Cordella MATSU, MD        CLINICAL DISCUSSION: Sarah [name] is a 73 y.o. female who is submitted for pre-surgical anesthesia review and clearance prior to her undergoing the above procedure. Patient is a Former Smoker (27.4 pack years; quit 10/2023). Pertinent PMH includes: CAD, multiple cerebral aneurysms (s/p coiling), diastolic dysfunction, aortic atherosclerosis, HTN, HLD, ESRD on dialysis, pulmonary hypertension, emphysema, anemia, remote renal cell carcinoma (s/p LEFT nephrectomy), OA, anxiety.  Patient is followed by cardiology (Gollan, MD). She was last seen in the cardiology clinic on ***; notes reviewed. ***At the time of her clinic visit, patient doing well overall from a cardiovascular perspective. Patient denied any chest pain,  shortness of breath, PND, orthopnea, palpitations, significant peripheral edema, weakness, fatigue, vertiginous symptoms, or presyncope/syncope. Patient with a past medical history significant for cardiovascular diagnoses. Documented physical exam was grossly benign, providing no evidence of acute exacerbation and/or decompensation of the patient's known cardiovascular conditions.  ***  Blood pressure***controlled at *** mmHg on currently prescribed *** therapies.  Patient is on *** for her HLD diagnosis and ASCVD prevention. ***Patient is not diabetic. ***She does not have an OSAH diagnosis. ***FC. No changes were made to her medication regimen during her visit with cardiology.  Patient scheduled to follow-up with outpatient cardiology in***months or sooner if needed.  Sarah [name] is scheduled for an elective ARTERIOVENOUS (AV) FISTULA CREATION (Left) on 08/16/2024 with Dr. Cordella MATSU Jama, MD. Given patient's past medical history significant for cardiovascular diagnoses, presurgical cardiac clearance was sought by the PAT team. ***  In review of her medication reconciliation, the patient is not noted to be taking any type of anticoagulation or antiplatelet therapies that would need to be held during her perioperative course.  Patient denies previous perioperative complications with anesthesia in the past. In review her EMR, it is noted that patient underwent a general anesthetic course here at [facility] ([facility]) in 12/2016 without documented complications.   MOST RECENT VITAL SIGNS:    08/09/2024    1:34 PM 07/20/2024   11:03 AM 07/17/2024   10:57 AM  Vitals with BMI  Height  5' 6 5' 7  Weight  180 lbs 2 oz 182 lbs  BMI  29.09 28.5  Systolic 135 134 877  Diastolic 72 79 68  Pulse 64 62 80   PROVIDERS/SPECIALISTS: NOTE: Primary physician provider listed below. Patient may have been seen by APP or partner within same practice.   PROVIDER ROLE /  SPECIALTY LAST OV  Schnier, Cordella MATSU, MD Vascular Surgery (Surgeon) 07/20/2024  Valerio Melanie DASEN, NP Primary Care Provider 01/10/2024  Perla Lye, MD Cardiology 04/17/2024; preop APP call ***  Jacobo Lye, MD Medical Oncology/Hematology 07/17/2024  Dennise Capri, MD Nephrology 06/07/2024   ALLERGIES: Allergies  Allergen Reactions   Codeine Itching   Tramadol  Hcl Other (See Comments)    , over sedated    CURRENT HOME MEDICATIONS: No current facility-administered medications for this encounter.    acetaminophen  (TYLENOL ) 325 MG tablet   amLODipine  (NORVASC ) 10 MG tablet   atenolol  (TENORMIN ) 100 MG tablet   atorvastatin  (LIPITOR) 40 MG tablet   calcitRIOL  (ROCALTROL ) 0.25 MCG capsule   Cholecalciferol (VITAMIN D-3 PO)   citalopram  (CELEXA ) 20 MG tablet   ferrous sulfate 325 (65 FE) MG tablet   furosemide  (LASIX ) 40 MG tablet   Loperamide HCl (IMODIUM PO)   losartan  (COZAAR ) 25 MG tablet   Multiple Vitamin (MULTIVITAMIN) tablet   HISTORY: Past Medical History:  Diagnosis Date   Anemia    Anxiety    Aortic atherosclerosis (HCC)    Arthritis    Benign neoplasm of breast 2012   CAD (coronary artery disease)    Cerebral aneurysm 2004   a.) x 4 per patient report; s/p coiling   Diastolic dysfunction    Dyspnea    Emphysema lung (HCC)    ESRD (end stage renal disease) on dialysis [facility])    a.) s/p LEFT nephrectomy for RCC 01/01/2017   Former smoker    Headache    Aneurysm   HLD (hyperlipidemia)    Hypertension    Obesity, unspecified    Personal history of tobacco use, presenting hazards to health    Pneumonia    Pulmonary hypertension associated with end-stage renal disease (ESRD) on dialysis [facility])    Renal cell cancer, left (HCC) 2018   a.) s/p nephrectomy 01/01/2017   Past Surgical History:  Procedure Laterality Date   ABDOMINAL HYSTERECTOMY  1993   BREAST BIOPSY Right 2012   stereo bx showing a 6mm radial scar in an estimated 8 volume of tissue    BREAST BIOPSY Right 04/09/2005   Fibrocystic changes, ductal adenosis, microcalcifications, focal stromal/epithelial proliferation, possible fibroadenoma type proliferation   CEREBRAL ANEURYSM REPAIR  2004   x 2   COLONOSCOPY  2008   Dr. Chipper   EYE SURGERY     as a child   FOOT SURGERY Right 2003   LAPAROSCOPIC NEPHRECTOMY, HAND ASSISTED Left 01/01/2017   Procedure: HAND ASSISTED LAPAROSCOPIC NEPHRECTOMY;  Surgeon: Redell Lynwood Napoleon, MD;  Location: [facility] ORS;  Service: Urology;  Laterality: Left;   TONSILLECTOMY  1997   Family History  Problem Relation Age of Onset   Cancer Cousin        maternal first cousin with breast cancer   Cancer Other        colon and ovarian cancers, no relationship listed   Cancer Father        prostate   Hypertension Father    Hypertension Sister    Hypertension Brother    Hypertension Paternal Grandmother    Heart disease Paternal Grandmother    Hypertension Paternal Grandfather    Heart disease Paternal Grandfather    Breast cancer Neg Hx    Social History   Tobacco Use   Smoking status: Former    Current packs/day: 0.00    Average packs/day: 0.5 packs/day for 54.8 years (27.4 ttl pk-yrs)    Types: Cigarettes  Start date: 79    Quit date: 10/2023    Years since quitting: 0.8   Smokeless tobacco: Never  Substance Use Topics   Alcohol use: Not Currently   LABS:  Hospital Outpatient Visit on 08/09/2024  Component Date Value Ref Range Status   ABO/RH(D) 08/09/2024 A POS   Final   Antibody Screen 08/09/2024 NEG   Final   Sample Expiration 08/09/2024 08/23/2024,2359   Final   Extend sample reason 08/09/2024    Final                   Value:NO TRANSFUSIONS OR PREGNANCY IN THE PAST 3 MONTHS Performed at [facility], [address] [place], [place] [address]    WBC 08/09/2024 5.3  4.0 - 10.5 K/uL Final   RBC 08/09/2024 3.67 (L)  3.87 - 5.11 MIL/uL Final   Hemoglobin 08/09/2024 10.8 (L)  12.0 - 15.0 g/dL Final   HCT  90/89/7974 32.0 (L)  36.0 - 46.0 % Final   MCV 08/09/2024 87.2  80.0 - 100.0 fL Final   MCH 08/09/2024 29.4  26.0 - 34.0 pg Final   MCHC 08/09/2024 33.8  30.0 - 36.0 g/dL Final   RDW 90/89/7974 15.7 (H)  11.5 - 15.5 % Final   Platelets 08/09/2024 251  150 - 400 K/uL Final   nRBC 08/09/2024 0.0  0.0 - 0.2 % Final   Performed at [facility], [address] [place], [place] [address]   Sodium 08/09/2024 136  135 - 145 mmol/L Final   Potassium 08/09/2024 3.6  3.5 - 5.1 mmol/L Final   Chloride 08/09/2024 103  98 - 111 mmol/L Final   CO2 08/09/2024 18 (L)  22 - 32 mmol/L Final   Glucose, Bld 08/09/2024 120 (H)  70 - 99 mg/dL Final   Glucose reference range applies only to samples taken after fasting for at least 8 hours.   BUN 08/09/2024 85 (H)  8 - 23 mg/dL Final   Creatinine, Ser 08/09/2024 4.10 (H)  0.44 - 1.00 mg/dL Final   Calcium  08/09/2024 9.2  8.9 - 10.3 mg/dL Final   GFR, Estimated 08/09/2024 11 (L)  >60 mL/min Final   Comment: (NOTE) Calculated using the CKD-EPI Creatinine Equation (2021)    Anion gap 08/09/2024 15  5 - 15 Final   Performed at [facility], [address] [place], [place] [address]    ECG: Date: 04/17/2024  Time ECG obtained: 1631 PM Rate: 65 bpm Rhythm: normal sinus Axis (leads I and aVF): normal Intervals: PR 142 ms. QRS 90 ms. QTc 440 ms. ST segment and T wave changes: No evidence of acute T wave abnormalities or significant ST segment elevation or depression.  Evidence of a possible, age undetermined, prior infarct:  No Comparison: Similar to previous tracing obtained on 08/10/2022   IMAGING / PROCEDURES: TRANSTHORACIC ECHOCARDIOGRAM performed on 02/03/2022 Left ventricular ejection fraction, by estimation, is 50 to 55%. The left ventricle has low normal function. The left ventricle has no regional wall motion abnormalities. Left ventricular diastolic parameters are consistent with Grade I diastolic dysfunction (impaired  relaxation).  Right ventricular systolic function is normal. The right ventricular size is normal. There is moderately elevated pulmonary artery systolic pressure. The estimated right ventricular systolic pressure is 46.7 mmHg.  Left atrial size was moderately dilated.  The mitral valve is normal in structure. Moderate mitral valve regurgitation. No evidence of mitral stenosis.  Tricuspid valve regurgitation is moderate.  The aortic valve is  tricuspid. Aortic valve regurgitation is not visualized. No aortic stenosis is present.  The inferior vena cava is normal in size with greater than 50% respiratory variability, suggesting right atrial pressure of 3 mmHg.   ULTRASOUND RENAL COMPLETE  performed on 06/30/2023 No acute abnormality identified. The previously noted hypoechoic lesion superior to the right kidney is unchanged, thought to be adrenal origin. This was seen on prior MRI of 2017 and described to be an adrenal adenoma. Status post left nephrectomy.  CT ABDOMEN PELVIS WO CONTRAST performed on 01/09/2022 Prior left nephrectomy without evidence of local recurrence or abdominopelvic metastatic disease on this noncontrast examination.  Numerous right-sided hypo and hyperdense renal lesions, the largest of which is in the upper pole measuring 4.9 cm previously 4.8 cm. While none of the lesions appear significantly changed to prior imaging, and they may reflect a combination of fluid attenuation and hemorrhagic/proteinaceous cysts these are incompletely evaluated on this noncontrast study and underlying renal neoplasm is not excluded. Cholelithiasis without findings of acute cholecystitis. Left-sided colonic diverticulosis without findings of acute diverticulitis. Aortic atherosclerosis  CT CHEST LUNG CA SCREEN LOW DOSE W/O CM performed on 08/27/2021 Lung-RADS 2, benign appearance or behavior. Continue annual screening with low-dose chest CT without contrast in 12 months.  Enlargement of the  pulmonary outflow tract and main pulmonary arteries suggests pulmonary arterial hypertension. Stable 3 cm right adrenal nodule. Features remain compatible with benign etiology such is adenoma given the long-term stability. Cholelithiasis. Aortic atherosclerosis  Emphysema  IMPRESSION AND PLAN: DANNISHA ECKMANN has been referred for pre-anesthesia review and clearance prior to her undergoing the planned anesthetic and procedural courses. Available labs, pertinent testing, and imaging results were personally reviewed by me in preparation for upcoming operative/procedural course. [facility] medical record has been updated following extensive record review and patient interview with PAT staff.   ATTENTION --> PENDING CLEARANCE AT THIS TIME -- NOTE/CONTENTS NOT FINAL UNTIL SIGNED This patient has been appropriately cleared by cardiology with an overall *** risk of patient experiencing significant perioperative cardiovascular complications. Based on clinical review performed today (08/14/24), barring any significant acute changes in the patient's overall condition, it is anticipated that she will be able to proceed with the planned surgical intervention. Any acute changes in clinical condition may necessitate her procedure being postponed and/or cancelled. Patient will meet with anesthesia team (MD and/or CRNA) on the day of her procedure for preoperative evaluation/assessment. Questions regarding anesthetic course will be fielded at that time.   Pre-surgical instructions were reviewed with the patient during his PAT appointment, and questions were fielded to satisfaction by PAT clinical staff. She has been instructed on which medications that she will need to hold prior to surgery, as well as the ones that have been deemed safe/appropriate to take on the day of her procedure. As part of the general education provided by PAT, patient made aware both verbally and in writing, that she would need to abstain from  the use of any illegal substances during her perioperative course. She was advised that failure to follow the provided instructions could necessitate case cancellation or result in serious perioperative complications up to and including death. Patient encouraged to contact PAT and/or her surgeon's office to discuss any questions or concerns that may arise prior to surgery; verbalized understanding.   Dorise Pereyra, MSN, APRN, FNP-C, CEN [facility]  Perioperative Services Nurse Practitioner Phone: [phone] Fax: [phone] 08/14/24 12:22 PM  NOTE: This note has been prepared using Dragon  dictation software. Despite my best ability to proofread, there is always the potential that unintentional transcriptional errors may still occur from this process.

## 2024-08-15 ENCOUNTER — Encounter: Payer: Self-pay | Admitting: Vascular Surgery

## 2024-08-15 NOTE — Telephone Encounter (Signed)
 Attempted to contact the pt to r/s missed tele appt. Unable to LVM as mailbox is full.  Also attempted to contact surgeon's office and received no response to update them. [id number]

## 2024-08-15 NOTE — Telephone Encounter (Signed)
 Again called patient's regarding preoperative cardiac evaluation, no answer and patient's voicemail box is full.

## 2024-08-16 ENCOUNTER — Ambulatory Visit
Admission: RE | Admit: 2024-08-16 | Discharge: 2024-08-16 | Disposition: A | Discharge status: Home / Self Care | Attending: Vascular Surgery | Admitting: Vascular Surgery

## 2024-08-16 ENCOUNTER — Other Ambulatory Visit: Payer: Self-pay | Admitting: *Deleted

## 2024-08-16 ENCOUNTER — Encounter: Payer: Self-pay | Admitting: Vascular Surgery

## 2024-08-16 ENCOUNTER — Other Ambulatory Visit: Payer: Self-pay

## 2024-08-16 ENCOUNTER — Encounter
Admission: RE | Disposition: A | Discharge status: Home / Self Care | Payer: Self-pay | Source: Home / Self Care | Attending: Vascular Surgery

## 2024-08-16 ENCOUNTER — Ambulatory Visit: Payer: Self-pay | Admitting: Urgent Care

## 2024-08-16 DIAGNOSIS — E7800 Pure hypercholesterolemia, unspecified: Secondary | ICD-10-CM | POA: Insufficient documentation

## 2024-08-16 DIAGNOSIS — I739 Peripheral vascular disease, unspecified: Secondary | ICD-10-CM | POA: Diagnosis not present

## 2024-08-16 DIAGNOSIS — I5032 Chronic diastolic (congestive) heart failure: Secondary | ICD-10-CM | POA: Diagnosis not present

## 2024-08-16 DIAGNOSIS — Z85528 Personal history of other malignant neoplasm of kidney: Secondary | ICD-10-CM | POA: Diagnosis not present

## 2024-08-16 DIAGNOSIS — D631 Anemia in chronic kidney disease: Secondary | ICD-10-CM | POA: Diagnosis not present

## 2024-08-16 DIAGNOSIS — Z992 Dependence on renal dialysis: Secondary | ICD-10-CM | POA: Diagnosis not present

## 2024-08-16 DIAGNOSIS — N186 End stage renal disease: Secondary | ICD-10-CM | POA: Diagnosis not present

## 2024-08-16 DIAGNOSIS — I081 Rheumatic disorders of both mitral and tricuspid valves: Secondary | ICD-10-CM | POA: Diagnosis not present

## 2024-08-16 DIAGNOSIS — Z8679 Personal history of other diseases of the circulatory system: Secondary | ICD-10-CM | POA: Insufficient documentation

## 2024-08-16 DIAGNOSIS — Z87891 Personal history of nicotine dependence: Secondary | ICD-10-CM | POA: Diagnosis not present

## 2024-08-16 DIAGNOSIS — I132 Hypertensive heart and chronic kidney disease with heart failure and with stage 5 chronic kidney disease, or end stage renal disease: Secondary | ICD-10-CM | POA: Insufficient documentation

## 2024-08-16 DIAGNOSIS — J439 Emphysema, unspecified: Secondary | ICD-10-CM | POA: Diagnosis not present

## 2024-08-16 DIAGNOSIS — N289 Disorder of kidney and ureter, unspecified: Secondary | ICD-10-CM

## 2024-08-16 DIAGNOSIS — I2510 Atherosclerotic heart disease of native coronary artery without angina pectoris: Secondary | ICD-10-CM | POA: Diagnosis not present

## 2024-08-16 DIAGNOSIS — I272 Pulmonary hypertension, unspecified: Secondary | ICD-10-CM | POA: Diagnosis not present

## 2024-08-16 DIAGNOSIS — Z01812 Encounter for preprocedural laboratory examination: Secondary | ICD-10-CM

## 2024-08-16 DIAGNOSIS — E785 Hyperlipidemia, unspecified: Secondary | ICD-10-CM | POA: Insufficient documentation

## 2024-08-16 DIAGNOSIS — Z79899 Other long term (current) drug therapy: Secondary | ICD-10-CM | POA: Insufficient documentation

## 2024-08-16 DIAGNOSIS — F419 Anxiety disorder, unspecified: Secondary | ICD-10-CM | POA: Diagnosis not present

## 2024-08-16 DIAGNOSIS — I509 Heart failure, unspecified: Secondary | ICD-10-CM | POA: Diagnosis not present

## 2024-08-16 DIAGNOSIS — Z905 Acquired absence of kidney: Secondary | ICD-10-CM | POA: Insufficient documentation

## 2024-08-16 DIAGNOSIS — I371 Nonrheumatic pulmonary valve insufficiency: Secondary | ICD-10-CM | POA: Insufficient documentation

## 2024-08-16 HISTORY — DX: Emphysema, unspecified: J43.9

## 2024-08-16 HISTORY — DX: Dependence on renal dialysis: Z99.2

## 2024-08-16 HISTORY — DX: Hyperlipidemia, unspecified: E78.5

## 2024-08-16 HISTORY — DX: Atherosclerotic heart disease of native coronary artery without angina pectoris: I25.10

## 2024-08-16 HISTORY — PX: AV FISTULA PLACEMENT: SHX1204

## 2024-08-16 HISTORY — DX: Other ill-defined heart diseases: I51.89

## 2024-08-16 HISTORY — DX: Other secondary pulmonary hypertension: I27.29

## 2024-08-16 HISTORY — DX: Calculus of gallbladder without cholecystitis without obstruction: K80.20

## 2024-08-16 HISTORY — DX: Benign neoplasm of right adrenal gland: D35.01

## 2024-08-16 HISTORY — DX: Anemia in chronic kidney disease: D63.1

## 2024-08-16 HISTORY — DX: Personal history of nicotine dependence: Z87.891

## 2024-08-16 HISTORY — DX: Diverticulosis of intestine, part unspecified, without perforation or abscess without bleeding: K57.90

## 2024-08-16 HISTORY — DX: End stage renal disease: N18.6

## 2024-08-16 HISTORY — DX: Other secondary pulmonary hypertension: N18.6

## 2024-08-16 LAB — POCT I-STAT, CHEM 8
BUN: 49 mg/dL — ABNORMAL HIGH (ref 8–23)
Calcium, Ion: 1.21 mmol/L (ref 1.15–1.40)
Chloride: 110 mmol/L (ref 98–111)
Creatinine, Ser: 3.8 mg/dL — ABNORMAL HIGH (ref 0.44–1.00)
Glucose, Bld: 92 mg/dL (ref 70–99)
HCT: 30 % — ABNORMAL LOW (ref 36.0–46.0)
Hemoglobin: 10.2 g/dL — ABNORMAL LOW (ref 12.0–15.0)
Potassium: 3.7 mmol/L (ref 3.5–5.1)
Sodium: 143 mmol/L (ref 135–145)
TCO2: 20 mmol/L — ABNORMAL LOW (ref 22–32)

## 2024-08-16 SURGERY — ARTERIOVENOUS (AV) FISTULA CREATION
Anesthesia: General | Site: Arm Upper | Laterality: Left

## 2024-08-16 MED ORDER — VASHE WOUND IRRIGATION OPTIME
TOPICAL | Status: DC | PRN
  Administered 2024-08-16: 4 [oz_av]

## 2024-08-16 MED ORDER — DEXMEDETOMIDINE HCL IN NACL 80 MCG/20ML IV SOLN
INTRAVENOUS | Status: DC | PRN
Start: 2024-08-16 — End: 2024-08-16
  Administered 2024-08-16: 8 ug via INTRAVENOUS

## 2024-08-16 MED ORDER — PROPOFOL 10 MG/ML IV BOLUS
INTRAVENOUS | Status: DC | PRN
  Administered 2024-08-16: 30 mg via INTRAVENOUS
  Administered 2024-08-16: 120 mg via INTRAVENOUS

## 2024-08-16 MED ORDER — ACETAMINOPHEN 10 MG/ML IV SOLN
INTRAVENOUS | Status: DC | PRN
  Administered 2024-08-16: 1000 mg via INTRAVENOUS

## 2024-08-16 MED ORDER — CHLORHEXIDINE GLUCONATE CLOTH 2 % EX PADS: 6.0000 | MEDICATED_PAD | Freq: Once | CUTANEOUS | Status: DC

## 2024-08-16 MED ORDER — CHLORHEXIDINE GLUCONATE 0.12 % MT SOLN
OROMUCOSAL | Status: AC
Start: 2024-08-16 — End: 2024-08-16
  Filled 2024-08-16: qty 15

## 2024-08-16 MED ORDER — ONDANSETRON HCL 4 MG/2ML IJ SOLN: 4.0000 mg | Freq: Four times a day (QID) | INTRAMUSCULAR | Status: DC | PRN

## 2024-08-16 MED ORDER — GLYCOPYRROLATE 0.2 MG/ML IJ SOLN
INTRAMUSCULAR | Status: DC | PRN
  Administered 2024-08-16: .2 mg via INTRAVENOUS

## 2024-08-16 MED ORDER — ORAL CARE MOUTH RINSE: 15.0000 mL | Freq: Once | OROMUCOSAL | Status: AC

## 2024-08-16 MED ORDER — PROPOFOL 10 MG/ML IV BOLUS
INTRAVENOUS | Status: AC
  Filled 2024-08-16: qty 20

## 2024-08-16 MED ORDER — EPHEDRINE SULFATE-NACL 50-0.9 MG/10ML-% IV SOSY
PREFILLED_SYRINGE | INTRAVENOUS | Status: DC | PRN
  Administered 2024-08-16: 10 mg via INTRAVENOUS
  Administered 2024-08-16 (×2): 5 mg via INTRAVENOUS

## 2024-08-16 MED ORDER — LIDOCAINE HCL (PF) 2 % IJ SOLN
INTRAMUSCULAR | Status: AC
  Filled 2024-08-16: qty 5

## 2024-08-16 MED ORDER — FENTANYL CITRATE (PF) 100 MCG/2ML IJ SOLN
INTRAMUSCULAR | Status: AC
  Filled 2024-08-16: qty 2

## 2024-08-16 MED ORDER — HEPARIN SODIUM (PORCINE) 5000 UNIT/ML IJ SOLN
INTRAMUSCULAR | Status: AC
  Filled 2024-08-16: qty 1

## 2024-08-16 MED ORDER — PAPAVERINE HCL 30 MG/ML IJ SOLN
INTRAMUSCULAR | Status: AC
  Filled 2024-08-16: qty 2

## 2024-08-16 MED ORDER — BUPIVACAINE LIPOSOME 1.3 % IJ SUSP
INTRAMUSCULAR | Status: AC
  Filled 2024-08-16: qty 20

## 2024-08-16 MED ORDER — OXYCODONE HCL 5 MG PO TABS: 5.0000 mg | ORAL_TABLET | Freq: Once | ORAL | Status: DC | PRN

## 2024-08-16 MED ORDER — CEFAZOLIN SODIUM-DEXTROSE 2-4 GM/100ML-% IV SOLN
INTRAVENOUS | Status: AC
  Filled 2024-08-16: qty 100

## 2024-08-16 MED ORDER — BUPIVACAINE LIPOSOME 1.3 % IJ SUSP
INTRAMUSCULAR | Status: DC | PRN
  Administered 2024-08-16: 18 mL
  Administered 2024-08-16: 12 mL

## 2024-08-16 MED ORDER — LIDOCAINE HCL (CARDIAC) PF 100 MG/5ML IV SOSY
PREFILLED_SYRINGE | INTRAVENOUS | Status: DC | PRN
  Administered 2024-08-16: 100 mg via INTRAVENOUS

## 2024-08-16 MED ORDER — HYDROCODONE-ACETAMINOPHEN 5-325 MG PO TABS: 1.0000 | ORAL_TABLET | Freq: Four times a day (QID) | ORAL | 0 refills | Status: DC | PRN

## 2024-08-16 MED ORDER — DEXAMETHASONE SODIUM PHOSPHATE 10 MG/ML IJ SOLN
INTRAMUSCULAR | Status: DC | PRN
  Administered 2024-08-16: 10 mg via INTRAVENOUS

## 2024-08-16 MED ORDER — ONDANSETRON HCL 4 MG/2ML IJ SOLN
INTRAMUSCULAR | Status: DC | PRN
  Administered 2024-08-16: 4 mg via INTRAVENOUS

## 2024-08-16 MED ORDER — ONDANSETRON HCL 4 MG/2ML IJ SOLN
INTRAMUSCULAR | Status: AC
  Filled 2024-08-16: qty 2

## 2024-08-16 MED ORDER — FENTANYL CITRATE (PF) 100 MCG/2ML IJ SOLN
INTRAMUSCULAR | Status: DC | PRN
  Administered 2024-08-16 (×2): 50 ug via INTRAVENOUS

## 2024-08-16 MED ORDER — SODIUM CHLORIDE 0.9 % IV SOLN: INTRAVENOUS | Status: DC

## 2024-08-16 MED ORDER — ACETAMINOPHEN 10 MG/ML IV SOLN
INTRAVENOUS | Status: AC
  Filled 2024-08-16: qty 100

## 2024-08-16 MED ORDER — DEXMEDETOMIDINE HCL IN NACL 80 MCG/20ML IV SOLN
INTRAVENOUS | Status: AC
  Filled 2024-08-16: qty 20

## 2024-08-16 MED ORDER — ROCURONIUM BROMIDE 100 MG/10ML IV SOLN
INTRAVENOUS | Status: DC | PRN
  Administered 2024-08-16: 20 mg via INTRAVENOUS
  Administered 2024-08-16: 40 mg via INTRAVENOUS

## 2024-08-16 MED ORDER — EPHEDRINE 5 MG/ML INJ
INTRAVENOUS | Status: AC
  Filled 2024-08-16: qty 5

## 2024-08-16 MED ORDER — CEFAZOLIN SODIUM-DEXTROSE 2-4 GM/100ML-% IV SOLN
2.0000 g | INTRAVENOUS | Status: AC
  Administered 2024-08-16: 2 g via INTRAVENOUS

## 2024-08-16 MED ORDER — HYDROMORPHONE HCL 1 MG/ML IJ SOLN: 1.0000 mg | Freq: Once | INTRAMUSCULAR | Status: DC | PRN

## 2024-08-16 MED ORDER — SODIUM CHLORIDE 0.9 % IR SOLN
Status: DC | PRN
  Administered 2024-08-16: 501 mL

## 2024-08-16 MED ORDER — CHLORHEXIDINE GLUCONATE 0.12 % MT SOLN
15.0000 mL | Freq: Once | OROMUCOSAL | Status: AC
  Administered 2024-08-16: 15 mL via OROMUCOSAL

## 2024-08-16 MED ORDER — BUPIVACAINE HCL (PF) 0.5 % IJ SOLN
INTRAMUSCULAR | Status: AC
  Filled 2024-08-16: qty 30

## 2024-08-16 MED ORDER — DEXAMETHASONE SODIUM PHOSPHATE 10 MG/ML IJ SOLN
INTRAMUSCULAR | Status: AC
  Filled 2024-08-16: qty 1

## 2024-08-16 MED ORDER — FENTANYL CITRATE (PF) 100 MCG/2ML IJ SOLN: 25.0000 ug | INTRAMUSCULAR | Status: DC | PRN

## 2024-08-16 MED ORDER — OXYCODONE HCL 5 MG/5ML PO SOLN: 5.0000 mg | Freq: Once | ORAL | Status: DC | PRN

## 2024-08-16 MED ORDER — SUGAMMADEX SODIUM 200 MG/2ML IV SOLN
INTRAVENOUS | Status: DC | PRN
  Administered 2024-08-16: 200 mg via INTRAVENOUS

## 2024-08-16 SURGICAL SUPPLY — 44 items
BAG DECANTER FOR FLEXI CONT (MISCELLANEOUS) ×1 IMPLANT
BLADE SURG 15 STRL LF DISP TIS (BLADE) IMPLANT
BLADE SURG SZ11 CARB STEEL (BLADE) ×1 IMPLANT
BNDG COHESIVE 4X5 TAN STRL LF (GAUZE/BANDAGES/DRESSINGS) IMPLANT
BRUSH SCRUB EZ 4% CHG (MISCELLANEOUS) ×1 IMPLANT
CHLORAPREP W/TINT 26 (MISCELLANEOUS) ×1 IMPLANT
CLAMP SUTURE YELLOW 5 PAIRS (MISCELLANEOUS) ×1 IMPLANT
CLEANSER WND VASHE 4 (WOUND CARE) IMPLANT
CLIP APPLIE 11 MED OPEN (CLIP) IMPLANT
CLIP APPLIE 9.375 SM OPEN (CLIP) IMPLANT
DERMABOND ADVANCED .7 DNX12 (GAUZE/BANDAGES/DRESSINGS) ×1 IMPLANT
DRESSING SURGICEL FIBRLLR 1X2 (HEMOSTASIS) ×1 IMPLANT
ELECT CAUTERY BLADE 6.4 (BLADE) ×1 IMPLANT
ELECTRODE REM PT RTRN 9FT ADLT (ELECTROSURGICAL) ×1 IMPLANT
GEL ULTRASOUND 20GR AQUASONIC (MISCELLANEOUS) IMPLANT
GLOVE BIO SURGEON STRL SZ7 (GLOVE) ×1 IMPLANT
GLOVE SURG SYN 8.0 PF PI (GLOVE) ×1 IMPLANT
GOWN STRL REUS W/ TWL LRG LVL3 (GOWN DISPOSABLE) ×2 IMPLANT
GOWN STRL REUS W/ TWL XL LVL3 (GOWN DISPOSABLE) ×1 IMPLANT
IV NS 500ML BAXH (IV SOLUTION) ×1 IMPLANT
KIT TURNOVER KIT A (KITS) ×1 IMPLANT
LABEL OR SOLS (LABEL) ×1 IMPLANT
LOOP VESSEL MAXI 1X406 RED (MISCELLANEOUS) ×1 IMPLANT
LOOP VESSEL MINI 0.8X406 BLUE (MISCELLANEOUS) ×2 IMPLANT
MANIFOLD NEPTUNE II (INSTRUMENTS) ×1 IMPLANT
NDL FILTER BLUNT 18X1 1/2 (NEEDLE) ×1 IMPLANT
NDL HYPO 30X.5 LL (NEEDLE) IMPLANT
NEEDLE FILTER BLUNT 18X1 1/2 (NEEDLE) ×1 IMPLANT
NEEDLE HYPO 30X.5 LL (NEEDLE) IMPLANT
PACK EXTREMITY ARMC (MISCELLANEOUS) ×1 IMPLANT
PAD PREP OB/GYN DISP 24X41 (PERSONAL CARE ITEMS) ×1 IMPLANT
PENCIL SMOKE EVACUATOR (MISCELLANEOUS) ×1 IMPLANT
STOCKINETTE 48X4 2 PLY STRL (GAUZE/BANDAGES/DRESSINGS) ×1 IMPLANT
STOCKINETTE STRL 4IN 9604848 (GAUZE/BANDAGES/DRESSINGS) ×1 IMPLANT
SUT MNCRL+ 5-0 UNDYED PC-3 (SUTURE) ×1 IMPLANT
SUT PROLENE 6 0 BV (SUTURE) ×4 IMPLANT
SUT SILK 2-0 18XBRD TIE 12 (SUTURE) ×1 IMPLANT
SUT SILK 3-0 18XBRD TIE 12 (SUTURE) ×1 IMPLANT
SUT SILK 4-0 18XBRD TIE 12 (SUTURE) ×1 IMPLANT
SUT VIC AB 3-0 SH 27X BRD (SUTURE) ×1 IMPLANT
SYR 20ML LL LF (SYRINGE) ×1 IMPLANT
SYR 3ML LL SCALE MARK (SYRINGE) ×1 IMPLANT
TRAP FLUID SMOKE EVACUATOR (MISCELLANEOUS) ×1 IMPLANT
WATER STERILE IRR 500ML POUR (IV SOLUTION) ×1 IMPLANT

## 2024-08-16 NOTE — Transfer of Care (Signed)
 Immediate Anesthesia Transfer of Care Note  Patient: Sarah [name]  Procedure(s) Performed: ARTERIOVENOUS (AV) FISTULA CREATION (Left: Arm Upper)  Patient Location: PACU  Anesthesia Type:General  Level of Consciousness: awake, alert , and drowsy  Airway & Oxygen Therapy: Patient Spontanous Breathing and Patient connected to face mask oxygen  Post-op Assessment: Report given to RN and Post -op Vital signs reviewed and stable  Post vital signs: Reviewed and stable  Last Vitals:  Vitals Value Taken Time  BP 147/69 08/16/24 09:33  Temp    Pulse 70 08/16/24 09:33  Resp 16 08/16/24 09:33  SpO2 100 % 08/16/24 09:33    Last Pain:  Vitals:   08/16/24 0628  TempSrc: Temporal  PainSc: 6          Complications: No notable events documented.

## 2024-08-16 NOTE — Op Note (Signed)
     OPERATIVE NOTE   PROCEDURE: left brachial cephalic arteriovenous fistula placement  PRE-OPERATIVE DIAGNOSIS: End Stage Renal Disease  POST-OPERATIVE DIAGNOSIS: End Stage Renal Disease  SURGEON: Sarah [name]  ASSISTANT(S): none  ANESTHESIA: general  ESTIMATED BLOOD LOSS: <50 cc  FINDING(S): 4.5 mm cephalic vein  SPECIMEN(S):  none  INDICATIONS:   [name] is a 73 y.o. female who presents with end stage renal disease.  The patient is scheduled for left brachiocephalic arteriovenous fistula placement.  The patient is aware the risks include but are not limited to: bleeding, infection, steal syndrome, nerve damage, ischemic monomelic neuropathy, failure to mature, and need for additional procedures.  The patient is aware of the risks of the procedure and elects to proceed forward.  DESCRIPTION: After full informed written consent was obtained from the patient, the patient was brought back to the operating room and placed supine upon the operating table.  Prior to induction, the patient received IV antibiotics.   After obtaining adequate anesthesia, the patient was then prepped and draped in the standard fashion for a left arm access procedure.   A curvilinear incision was then created midway between the radial impulse and the cephalic vein. The cephalic vein was then identified and dissected circumferentially. It was marked with a surgical marker.    Attention was then turned to the brachial artery which was exposed through the same incision and looped proximally and distally. Side branches were controlled with 4-0 silk ties.  The distal segment of the vein was ligated with a  2-0 silk, and the vein was transected.  The proximal segment was interrogated with serial dilators.  The vein accepted up to a 4.5 mm dilator without any difficulty. Heparinized saline was infused into the vein and clamped it with a small bulldog.  At this point, I reset my exposure of the  brachial artery and controlled the artery with vessel loops proximally and distally.  An arteriotomy was then made with a #11 blade, and extended with a Potts scissor.  Heparinized saline was injected proximal and distal into the radial artery.  The vein was then approximated to the artery while the artery was in its native bed and subsequently the vein was beveled using Potts scissors. The vein was then sewn to the artery in an end-to-side configuration with a running stitch of 6-0 Prolene.  Prior to completing this anastomosis Flushing maneuvers were performed and the artery was allowed to forward and back bleed.  There was no evidence of clot from any vessels.  I completed the anastomosis in the usual fashion and then released all vessel loops and clamps.    There was good  thrill in the venous outflow, and there was 1+ palpable radial pulse.  At this point, I irrigated out the surgical wound.  There was no further active bleeding.  The subcutaneous tissue was reapproximated with a running stitch of 3-0 Vicryl.  The skin was then reapproximated with a running subcuticular stitch of 4-0 Vicryl.  The skin was then cleaned, dried, and reinforced with Dermabond.    The patient tolerated this procedure well.   COMPLICATIONS: None  CONDITION: Sarah [name] Sarah [name] [facility]  Office: [phone]   08/16/2024, 9:29 AM

## 2024-08-16 NOTE — Interval H&P Note (Signed)
 History and Physical Interval Note:  08/16/2024 7:18 AM  Sarah [name]  has presented today for surgery, with the diagnosis of ESRD.  The various methods of treatment have been discussed with the patient and family. After consideration of risks, benefits and other options for treatment, the patient has consented to  Procedure(s) with comments: ARTERIOVENOUS (AV) FISTULA CREATION (Left) - RADIALCEPHALIC as a surgical intervention.  The patient's history has been reviewed, patient examined, no change in status, stable for surgery.  I have reviewed the patient's chart and labs.  Questions were answered to the patient's satisfaction.     Cordella Shawl

## 2024-08-16 NOTE — Anesthesia Preprocedure Evaluation (Signed)
 Anesthesia Evaluation  Patient identified by MRN, date of birth, ID band Patient awake    Reviewed: Allergy & Precautions, H&P , NPO status , Patient's Chart, lab work & pertinent test results, reviewed documented beta blocker date and time   Airway Mallampati: III  TM Distance: >3 FB Neck ROM: full    Dental  (+) Teeth Intact   Pulmonary neg pulmonary ROS, former smoker   Pulmonary exam normal        Cardiovascular hypertension, + CAD, + Peripheral Vascular Disease and +CHF  Normal cardiovascular exam Rhythm:regular Rate:Normal     Neuro/Psych  PSYCHIATRIC DISORDERS Anxiety Depression    negative neurological ROS  negative psych ROS   GI/Hepatic negative GI ROS, Neg liver ROS,,,  Endo/Other  negative endocrine ROS    Renal/GU Renal disease     Musculoskeletal   Abdominal   Peds  Hematology negative hematology ROS (+)   Anesthesia Other Findings Past Medical History: No date: Abnormal mammogram, unspecified 2004: Aneurysm (HCC)     Comment: cerebral x  4, [facility] No date: Anxiety No date: Arthritis 2012: Benign neoplasm of breast 2010: Breast complaint No date: [facility])     Comment: Renal Cell No date: Chronic kidney disease     Comment: Renal Cell Cancer No date: Hypertension No date: Obesity, unspecified No date: Personal history of tobacco use, presenting ha* No date: Special screening for malignant neoplasms, col* Past Surgical History: 1993: ABDOMINAL HYSTERECTOMY 2012: BREAST BIOPSY Right     Comment: stereo bx showing a 6mm radial scar in an               estimated 8 volume of tissue Apr 09, 2005: BREAST BIOPSY Right     Comment: Fibrocystic changes, ductal adenosis,               microcalcifications, focal stromal/epithelial               proliferation, possible fibroadenoma type               proliferation 2004: CEREBRAL ANEURYSM REPAIR 2008: COLONOSCOPY     Comment: Dr.  Chipper No date: EYE SURGERY 2003: FOOT SURGERY Right 1997: TONSILLECTOMY   Reproductive/Obstetrics negative OB ROS                              Anesthesia Physical Anesthesia Plan  ASA: 3  Anesthesia Plan: General ETT   Post-op Pain Management:    Induction: Intravenous  PONV Risk Score and Plan: 3 and Ondansetron  and Dexamethasone   Airway Management Planned: LMA  Additional Equipment:   Intra-op Plan:   Post-operative Plan: Extubation in OR  Informed Consent: I have reviewed the patients History and Physical, chart, labs and discussed the procedure including the risks, benefits and alternatives for the proposed anesthesia with the patient or authorized representative who has indicated his/her understanding and acceptance.     Dental Advisory Given  Plan Discussed with: Anesthesiologist, CRNA and Surgeon  Anesthesia Plan Comments: (Patient consented for risks of anesthesia including but not limited to:  - adverse reactions to medications - damage to eyes, teeth, lips or other oral mucosa - nerve damage due to positioning  - sore throat or hoarseness - Damage to heart, brain, nerves, lungs, other parts of body or loss of life  Patient voiced understanding and assent.)        Anesthesia Quick Evaluation

## 2024-08-16 NOTE — Anesthesia Procedure Notes (Signed)
 Procedure Name: Intubation Date/Time: 08/16/2024 7:40 AM  Performed by: Dominica Krabbe, CRNAPre-anesthesia Checklist: Patient identified, Emergency Drugs available, Suction available, Patient being monitored and Timeout performed Patient Re-evaluated:Patient Re-evaluated prior to induction Oxygen Delivery Method: Circle system utilized Preoxygenation: Pre-oxygenation with 100% oxygen Induction Type: IV induction Laryngoscope Size: McGrath and 3 Grade View: Grade II Tube type: Oral Tube size: 6.5 mm Number of attempts: 1 Airway Equipment and Method: Stylet and Video-laryngoscopy Placement Confirmation: ETT inserted through vocal cords under direct vision, positive ETCO2 and breath sounds checked- equal and bilateral Secured at: 21 cm Tube secured with: Tape Dental Injury: Teeth and Oropharynx as per pre-operative assessment

## 2024-08-17 ENCOUNTER — Inpatient Hospital Stay

## 2024-08-17 ENCOUNTER — Encounter: Payer: Self-pay | Admitting: Vascular Surgery

## 2024-08-17 ENCOUNTER — Inpatient Hospital Stay: Attending: Oncology

## 2024-08-17 NOTE — Anesthesia Postprocedure Evaluation (Signed)
 Anesthesia Post Note  Patient: Sarah [name]  Procedure(s) Performed: ARTERIOVENOUS (AV) FISTULA CREATION (Left: Arm Upper)  Patient location during evaluation: PACU Anesthesia Type: General Level of consciousness: awake and alert Pain management: pain level controlled Vital Signs Assessment: post-procedure vital signs reviewed and stable Respiratory status: spontaneous breathing, nonlabored ventilation, respiratory function stable and patient connected to nasal cannula oxygen Cardiovascular status: blood pressure returned to baseline and stable Postop Assessment: no apparent nausea or vomiting Anesthetic complications: no   No notable events documented.   Last Vitals:  Vitals:   08/16/24 1005 08/16/24 1013  BP:  (!) 157/75  Pulse: 70 76  Resp: 14 16  Temp:  36.6 C  SpO2: 94% 96%    Last Pain:  Vitals:   08/16/24 1013  TempSrc: Temporal  PainSc: 0-No pain                 Debby Mines

## 2024-08-22 ENCOUNTER — Telehealth: Payer: Self-pay | Admitting: Oncology

## 2024-08-22 DIAGNOSIS — I129 Hypertensive chronic kidney disease with stage 1 through stage 4 chronic kidney disease, or unspecified chronic kidney disease: Secondary | ICD-10-CM | POA: Diagnosis not present

## 2024-08-22 DIAGNOSIS — D631 Anemia in chronic kidney disease: Secondary | ICD-10-CM | POA: Diagnosis not present

## 2024-08-22 DIAGNOSIS — N184 Chronic kidney disease, stage 4 (severe): Secondary | ICD-10-CM | POA: Diagnosis not present

## 2024-08-22 DIAGNOSIS — N2581 Secondary hyperparathyroidism of renal origin: Secondary | ICD-10-CM | POA: Diagnosis not present

## 2024-08-22 DIAGNOSIS — R600 Localized edema: Secondary | ICD-10-CM | POA: Diagnosis not present

## 2024-08-22 DIAGNOSIS — Z905 Acquired absence of kidney: Secondary | ICD-10-CM | POA: Diagnosis not present

## 2024-08-22 DIAGNOSIS — I10 Essential (primary) hypertension: Secondary | ICD-10-CM | POA: Diagnosis not present

## 2024-08-22 NOTE — Telephone Encounter (Signed)
 Returned call from pt after she left a vm asking to r/s an appt. Pt asked what her next appt date is and I told her about the appts in mid-October. Pt states that these appt dates work for her. I asked if the pt would like me to mail out an appt reminder and pt refused. - LH

## 2024-09-05 ENCOUNTER — Other Ambulatory Visit (INDEPENDENT_AMBULATORY_CARE_PROVIDER_SITE_OTHER): Payer: Self-pay | Admitting: Vascular Surgery

## 2024-09-05 DIAGNOSIS — N186 End stage renal disease: Secondary | ICD-10-CM

## 2024-09-06 ENCOUNTER — Ambulatory Visit (INDEPENDENT_AMBULATORY_CARE_PROVIDER_SITE_OTHER): Admitting: Nurse Practitioner

## 2024-09-06 ENCOUNTER — Other Ambulatory Visit (INDEPENDENT_AMBULATORY_CARE_PROVIDER_SITE_OTHER)

## 2024-09-06 ENCOUNTER — Encounter (INDEPENDENT_AMBULATORY_CARE_PROVIDER_SITE_OTHER): Payer: Self-pay | Admitting: Nurse Practitioner

## 2024-09-06 VITALS — BP 159/72 | HR 76 | Ht 66.0 in | Wt 184.6 lb

## 2024-09-06 DIAGNOSIS — N186 End stage renal disease: Secondary | ICD-10-CM

## 2024-09-06 NOTE — Progress Notes (Signed)
 Subjective:    Patient ID: Sarah [name], female    DOB: 07/30/1951, 73 y.o.   MRN: [id number] Chief Complaint  Patient presents with   Follow-up    3 weeks (09/06/2024); follow up after procedure with an HDA ultrasound    The patient is a 73 year old female who returns today following left brachiocephalic AV fistula placement.  She notes that she initially had some pain and swelling postprocedure but that has resolved at this time.  Her incision is well-healed.  She denies any symptoms consistent with steal syndrome.  Today noninvasive studies show a low volume of 2058 with no areas of significant stenosis.  She is still not yet on dialysis and currently is not exhibiting any uremic symptoms.    Review of Systems  Cardiovascular:  Negative for leg swelling.  Neurological:  Positive for weakness.  All other systems reviewed and are negative.      Objective:   Physical Exam Vitals reviewed.  HENT:     Head: Normocephalic.  Cardiovascular:     Rate and Rhythm: Normal rate.     Pulses:          Radial pulses are 2+ on the left side.     Arteriovenous access: Left arteriovenous access is present.     Comments: Good thrill and bruit Pulmonary:     Effort: Pulmonary effort is normal.  Skin:    General: Skin is warm and dry.  Neurological:     Mental Status: She is alert and oriented to person, place, and time.  Psychiatric:        Mood and Affect: Mood normal.        Behavior: Behavior normal.        Thought Content: Thought content normal.        Judgment: Judgment normal.     BP (!) 159/72 Comment: has not had medication this am  Pulse 76   Ht 5' 6 (1.676 m)   Wt 184 lb 9.6 oz (83.7 kg)   BMI 29.80 kg/m   Past Medical History:  Diagnosis Date   Adrenal adenoma, right    Anemia of chronic renal failure    Anxiety    Aortic atherosclerosis    Arthritis    Benign neoplasm of breast 2012   CAD (coronary artery disease)    Cerebral aneurysm 2004   a.) s/p SAH  with subsequent cerebral aneurysm coiling on 06/28/2003 --> [place] 10 coils were placed to a 4 mm PCA, 2 mm anterior choroidal, and 3 mm superior hypophyseal artery aneurysms; b.) s/p coiling of a 4 mm RIGHT ICA aneurysm on 10/29/2003   Cholelithiasis    Diastolic dysfunction    Diverticulosis    Dyspnea    Emphysema lung (HCC)    ESRD (end stage renal disease) on dialysis [facility])    a.) s/p LEFT nephrectomy for RCC 01/01/2017   Former smoker    Headache    HLD (hyperlipidemia)    Hypertension    Obesity, unspecified    Pneumonia    Pulmonary hypertension associated with end-stage renal disease (ESRD) on dialysis (HCC)    Renal cell cancer, left (HCC) 2018   a.) s/p nephrectomy 01/01/2017   SAH (subarachnoid hemorrhage) (HCC) 06/27/2003   a.) s/p cerebral aneurysm coiling on 06/28/2003 --> Sapphire 10 coils were placed to a 4 mm PCA, 2 mm anterior choroidal, and 3 mm superior hypophyseal artery aneurysms    Social History   Socioeconomic History   Marital  status: Divorced    Spouse name: Not on file   Number of children: 1   Years of education: Not on file   Highest education level: GED or equivalent  Occupational History    Comment: full time    Occupation: retired  Tobacco Use   Smoking status: Former    Current packs/day: 0.00    Average packs/day: 0.5 packs/day for 54.8 years (27.4 ttl pk-yrs)    Types: Cigarettes    Start date: 71    Quit date: 10/2023    Years since quitting: 0.9   Smokeless tobacco: Never  Vaping Use   Vaping status: Never Used  Substance and Sexual Activity   Alcohol use: Not Currently   Drug use: No   Sexual activity: Yes  Other Topics Concern   Not on file  Social History Narrative   Live alones   Social Drivers of Health   Financial Resource Strain: Low Risk  (06/29/2024)   Overall Financial Resource Strain (CARDIA)    Difficulty of Paying Living Expenses: Not hard at all  Food Insecurity: No Food Insecurity (06/29/2024)   Hunger  Vital Sign    Worried About Running Out of Food in the Last Year: Never true    Ran Out of Food in the Last Year: Never true  Transportation Needs: No Transportation Needs (06/29/2024)   PRAPARE - Administrator, Civil Service (Medical): No    Lack of Transportation (Non-Medical): No  Physical Activity: Inactive (06/29/2024)   Exercise Vital Sign    Days of Exercise per Week: 0 days    Minutes of Exercise per Session: 0 min  Stress: No Stress Concern Present (06/29/2024)   [employer] of Occupational Health - Occupational Stress Questionnaire    Feeling of Stress: Not at all  Social Connections: Moderately Isolated (06/29/2024)   Social Connection and Isolation Panel    Frequency of Communication with Friends and Family: More than three times a week    Frequency of Social Gatherings with Friends and Family: Once a week    Attends Religious Services: More than 4 times per year    Active Member of [employer] or Organizations: No    Attends Banker Meetings: Never    Marital Status: Divorced  Catering manager Violence: Not At Risk (06/29/2024)   Humiliation, Afraid, Rape, and Kick questionnaire    Fear of Current or Ex-Partner: No    Emotionally Abused: No    Physically Abused: No    Sexually Abused: No    Past Surgical History:  Procedure Laterality Date   ABDOMINAL HYSTERECTOMY  1993   ANEURYSM COILING Left 06/28/2003   ANEURYSM COILING Right 10/29/2003   AV FISTULA PLACEMENT Left 08/16/2024   Procedure: ARTERIOVENOUS (AV) FISTULA CREATION;  Surgeon: Jama Cordella MATSU, MD;  Location: [facility] ORS;  Service: Vascular;  Laterality: Left;  BRACHIOCEPHALIC   BREAST BIOPSY Right 2012   stereo bx showing a 6mm radial scar in an estimated 8 volume of tissue   BREAST BIOPSY Right 04/09/2005   Fibrocystic changes, ductal adenosis, microcalcifications, focal stromal/epithelial proliferation, possible fibroadenoma type proliferation   COLONOSCOPY  2008   Dr. Chipper   EYE  SURGERY     as a child   FOOT SURGERY Right 2003   LAPAROSCOPIC NEPHRECTOMY, HAND ASSISTED Left 01/01/2017   Procedure: HAND ASSISTED LAPAROSCOPIC NEPHRECTOMY;  Surgeon: Redell Lynwood Napoleon, MD;  Location: [facility] ORS;  Service: Urology;  Laterality: Left;   TONSILLECTOMY  1997  Family History  Problem Relation Age of Onset   Cancer Cousin        maternal first cousin with breast cancer   Cancer Other        colon and ovarian cancers, no relationship listed   Cancer Father        prostate   Hypertension Father    Hypertension Sister    Hypertension Brother    Hypertension Paternal Grandmother    Heart disease Paternal Grandmother    Hypertension Paternal Grandfather    Heart disease Paternal Grandfather    Breast cancer Neg Hx     Allergies  Allergen Reactions   Codeine Itching   Tramadol  Hcl Other (See Comments)    , over sedated       Latest Ref Rng & Units 08/16/2024    7:08 AM 08/09/2024    2:13 PM 07/17/2024   10:33 AM  CBC  WBC 4.0 - 10.5 K/uL  5.3  6.8   Hemoglobin 12.0 - 15.0 g/dL 89.7  89.1  9.1   Hematocrit 36.0 - 46.0 % 30.0  32.0  27.9   Platelets 150 - 400 K/uL  251  244       CMP     Component Value Date/Time   NA 143 08/16/2024 0708   NA 146 (H) 03/23/2022 1413   K 3.7 08/16/2024 0708   CL 110 08/16/2024 0708   CO2 18 (L) 08/09/2024 1413   GLUCOSE 92 08/16/2024 0708   BUN 49 (H) 08/16/2024 0708   BUN 37 (H) 03/23/2022 1413   CREATININE 3.80 (H) 08/16/2024 0708   CREATININE 3.32 (H) 07/17/2024 1033   CALCIUM  9.2 08/09/2024 1413   PROT 7.2 07/17/2024 1033   PROT 6.6 06/27/2018 1112   ALBUMIN 3.7 07/17/2024 1033   ALBUMIN 3.9 06/27/2018 1112   AST 16 07/17/2024 1033   ALT 9 07/17/2024 1033   ALKPHOS 58 07/17/2024 1033   BILITOT 0.5 07/17/2024 1033   EGFR 24 (L) 03/23/2022 1413   GFRNONAA 11 (L) 08/09/2024 1413   GFRNONAA 14 (L) 07/17/2024 1033     No results found.     Assessment & Plan:   1. ESRD (end stage renal disease) (HCC)  (Primary) Today the patient has a very good flow volume and a good thrill and bruit.  She is not yet on dialysis.  Typically with an AV fistula it reaches maturity in 8 to 12 weeks optimally.  I can feel some of her fistula but it is not extremely prominent just yet.  Will have her return in 5 to 6 weeks to evaluate the fistula maturity.   Current Outpatient Medications on File Prior to Visit  Medication Sig Dispense Refill   acetaminophen  (TYLENOL ) 325 MG tablet Take 650 mg by mouth every 6 (six) hours as needed for mild pain, moderate pain or headache.      amLODipine  (NORVASC ) 10 MG tablet Take 10 mg by mouth daily.     atenolol  (TENORMIN ) 100 MG tablet Take 1 tablet (100 mg total) by mouth daily. 90 tablet 4   atorvastatin  (LIPITOR) 40 MG tablet Take 1 tablet (40 mg total) by mouth daily. 90 tablet 3   calcitRIOL  (ROCALTROL ) 0.25 MCG capsule Take 1 capsule (0.25 mcg total) by mouth daily. 30 capsule 11   Cholecalciferol (VITAMIN D-3 PO) Take 600 Units by mouth daily.     citalopram  (CELEXA ) 20 MG tablet Take 1 tablet (20 mg total) by mouth daily. 90 tablet 4  ferrous sulfate 325 (65 FE) MG tablet Take 325 mg by mouth daily with breakfast.     furosemide  (LASIX ) 40 MG tablet Take 1 tablet (40 mg total) by mouth daily. 90 tablet 3   Loperamide HCl (IMODIUM PO) Take 2-4 mg by mouth 4 (four) times daily as needed (Diarrhea).     losartan  (COZAAR ) 25 MG tablet Take 1 tablet (25 mg total) by mouth daily. 90 tablet 3   Multiple Vitamin (MULTIVITAMIN) tablet Take 1 tablet by mouth daily.     HYDROcodone -acetaminophen  (NORCO/VICODIN) 5-325 MG tablet Take 1 tablet by mouth every 6 (six) hours as needed for moderate pain (pain score 4-6) or severe pain (pain score 7-10). (Patient not taking: Reported on 09/06/2024) 30 tablet 0   No current facility-administered medications on file prior to visit.    There are no Patient Instructions on file for this visit. No follow-ups on file.   Tyion Boylen E Shelvie Salsberry,  NP

## 2024-09-15 ENCOUNTER — Inpatient Hospital Stay: Attending: Oncology

## 2024-09-15 ENCOUNTER — Inpatient Hospital Stay

## 2024-09-15 VITALS — BP 143/71 | HR 69

## 2024-09-15 DIAGNOSIS — I129 Hypertensive chronic kidney disease with stage 1 through stage 4 chronic kidney disease, or unspecified chronic kidney disease: Secondary | ICD-10-CM | POA: Insufficient documentation

## 2024-09-15 DIAGNOSIS — N184 Chronic kidney disease, stage 4 (severe): Secondary | ICD-10-CM | POA: Diagnosis present

## 2024-09-15 DIAGNOSIS — D631 Anemia in chronic kidney disease: Secondary | ICD-10-CM

## 2024-09-15 LAB — CBC WITH DIFFERENTIAL/PLATELET
Abs Immature Granulocytes: 0.04 K/uL (ref 0.00–0.07)
Basophils Absolute: 0 K/uL (ref 0.0–0.1)
Basophils Relative: 1 %
Eosinophils Absolute: 0.1 K/uL (ref 0.0–0.5)
Eosinophils Relative: 1 %
HCT: 29.5 % — ABNORMAL LOW (ref 36.0–46.0)
Hemoglobin: 9.7 g/dL — ABNORMAL LOW (ref 12.0–15.0)
Immature Granulocytes: 1 %
Lymphocytes Relative: 19 %
Lymphs Abs: 1.6 K/uL (ref 0.7–4.0)
MCH: 29 pg (ref 26.0–34.0)
MCHC: 32.9 g/dL (ref 30.0–36.0)
MCV: 88.1 fL (ref 80.0–100.0)
Monocytes Absolute: 0.9 K/uL (ref 0.1–1.0)
Monocytes Relative: 11 %
Neutro Abs: 5.8 K/uL (ref 1.7–7.7)
Neutrophils Relative %: 67 %
Platelets: 298 K/uL (ref 150–400)
RBC: 3.35 MIL/uL — ABNORMAL LOW (ref 3.87–5.11)
RDW: 15.6 % — ABNORMAL HIGH (ref 11.5–15.5)
WBC: 8.5 K/uL (ref 4.0–10.5)
nRBC: 0 % (ref 0.0–0.2)

## 2024-09-15 MED ORDER — DARBEPOETIN ALFA 200 MCG/0.4ML IJ SOSY
200.0000 ug | PREFILLED_SYRINGE | Freq: Once | INTRAMUSCULAR | Status: AC
  Administered 2024-09-15: 200 ug via SUBCUTANEOUS
  Filled 2024-09-15: qty 0.4

## 2024-10-11 ENCOUNTER — Ambulatory Visit (INDEPENDENT_AMBULATORY_CARE_PROVIDER_SITE_OTHER): Admitting: Nurse Practitioner

## 2024-10-16 ENCOUNTER — Other Ambulatory Visit: Payer: Self-pay | Admitting: *Deleted

## 2024-10-16 DIAGNOSIS — D631 Anemia in chronic kidney disease: Secondary | ICD-10-CM

## 2024-10-17 ENCOUNTER — Inpatient Hospital Stay

## 2024-10-17 ENCOUNTER — Inpatient Hospital Stay: Attending: Oncology

## 2024-10-17 VITALS — BP 164/75

## 2024-10-17 DIAGNOSIS — N184 Chronic kidney disease, stage 4 (severe): Secondary | ICD-10-CM | POA: Diagnosis not present

## 2024-10-17 DIAGNOSIS — D631 Anemia in chronic kidney disease: Secondary | ICD-10-CM

## 2024-10-17 DIAGNOSIS — I129 Hypertensive chronic kidney disease with stage 1 through stage 4 chronic kidney disease, or unspecified chronic kidney disease: Secondary | ICD-10-CM | POA: Insufficient documentation

## 2024-10-17 LAB — CBC WITH DIFFERENTIAL/PLATELET
Abs Immature Granulocytes: 0.03 K/uL (ref 0.00–0.07)
Basophils Absolute: 0 K/uL (ref 0.0–0.1)
Basophils Relative: 1 %
Eosinophils Absolute: 0.1 K/uL (ref 0.0–0.5)
Eosinophils Relative: 1 %
HCT: 30.3 % — ABNORMAL LOW (ref 36.0–46.0)
Hemoglobin: 9.9 g/dL — ABNORMAL LOW (ref 12.0–15.0)
Immature Granulocytes: 0 %
Lymphocytes Relative: 16 %
Lymphs Abs: 1.3 K/uL (ref 0.7–4.0)
MCH: 28.7 pg (ref 26.0–34.0)
MCHC: 32.7 g/dL (ref 30.0–36.0)
MCV: 87.8 fL (ref 80.0–100.0)
Monocytes Absolute: 0.8 K/uL (ref 0.1–1.0)
Monocytes Relative: 10 %
Neutro Abs: 5.5 K/uL (ref 1.7–7.7)
Neutrophils Relative %: 72 %
Platelets: 258 K/uL (ref 150–400)
RBC: 3.45 MIL/uL — ABNORMAL LOW (ref 3.87–5.11)
RDW: 16 % — ABNORMAL HIGH (ref 11.5–15.5)
WBC: 7.7 K/uL (ref 4.0–10.5)
nRBC: 0 % (ref 0.0–0.2)

## 2024-10-17 MED ORDER — DARBEPOETIN ALFA 200 MCG/0.4ML IJ SOSY
200.0000 ug | PREFILLED_SYRINGE | Freq: Once | INTRAMUSCULAR | Status: AC
  Administered 2024-10-17: 200 ug via SUBCUTANEOUS
  Filled 2024-10-17: qty 0.4

## 2024-10-18 ENCOUNTER — Ambulatory Visit: Payer: Self-pay

## 2024-10-18 NOTE — Telephone Encounter (Signed)
 Second call back to patient, no answer. LVM to call clinic back at [phone].

## 2024-10-18 NOTE — Telephone Encounter (Signed)
 Copied from CRM [id number]. Topic: Clinical - Red Word Triage >> Oct 18, 2024 12:35 PM Tinnie BROCKS wrote: Red Word that prompted transfer to Nurse Triage: Coughing and short of breath. Requesting appt for Friday. Established with [facility]. [phone]

## 2024-10-19 NOTE — Telephone Encounter (Signed)
 Called patient and left a message for patient to call back to get scheduled. Needs acute visit scheduled, if we do not have openings I was going to offer other locations.

## 2024-10-19 NOTE — Telephone Encounter (Signed)
 Patient needs an appointment

## 2024-10-20 ENCOUNTER — Other Ambulatory Visit: Payer: Self-pay

## 2024-10-20 ENCOUNTER — Emergency Department
Admission: EM | Admit: 2024-10-20 | Discharge: 2024-10-20 | Disposition: A | Discharge status: Home / Self Care | Attending: Emergency Medicine | Admitting: Emergency Medicine

## 2024-10-20 ENCOUNTER — Telehealth: Payer: Self-pay | Admitting: Nurse Practitioner

## 2024-10-20 ENCOUNTER — Emergency Department

## 2024-10-20 ENCOUNTER — Ambulatory Visit: Admitting: Nurse Practitioner

## 2024-10-20 DIAGNOSIS — R5381 Other malaise: Secondary | ICD-10-CM | POA: Diagnosis not present

## 2024-10-20 DIAGNOSIS — R630 Anorexia: Secondary | ICD-10-CM | POA: Insufficient documentation

## 2024-10-20 DIAGNOSIS — R059 Cough, unspecified: Secondary | ICD-10-CM | POA: Insufficient documentation

## 2024-10-20 DIAGNOSIS — Z992 Dependence on renal dialysis: Secondary | ICD-10-CM | POA: Diagnosis not present

## 2024-10-20 DIAGNOSIS — I120 Hypertensive chronic kidney disease with stage 5 chronic kidney disease or end stage renal disease: Secondary | ICD-10-CM | POA: Diagnosis not present

## 2024-10-20 DIAGNOSIS — N281 Cyst of kidney, acquired: Secondary | ICD-10-CM | POA: Diagnosis not present

## 2024-10-20 DIAGNOSIS — N186 End stage renal disease: Secondary | ICD-10-CM | POA: Diagnosis not present

## 2024-10-20 DIAGNOSIS — Z905 Acquired absence of kidney: Secondary | ICD-10-CM | POA: Diagnosis not present

## 2024-10-20 DIAGNOSIS — I2510 Atherosclerotic heart disease of native coronary artery without angina pectoris: Secondary | ICD-10-CM | POA: Insufficient documentation

## 2024-10-20 DIAGNOSIS — R531 Weakness: Secondary | ICD-10-CM | POA: Diagnosis not present

## 2024-10-20 DIAGNOSIS — D631 Anemia in chronic kidney disease: Secondary | ICD-10-CM | POA: Diagnosis not present

## 2024-10-20 DIAGNOSIS — I129 Hypertensive chronic kidney disease with stage 1 through stage 4 chronic kidney disease, or unspecified chronic kidney disease: Secondary | ICD-10-CM | POA: Diagnosis not present

## 2024-10-20 DIAGNOSIS — R1032 Left lower quadrant pain: Secondary | ICD-10-CM | POA: Insufficient documentation

## 2024-10-20 DIAGNOSIS — Z85528 Personal history of other malignant neoplasm of kidney: Secondary | ICD-10-CM | POA: Diagnosis not present

## 2024-10-20 DIAGNOSIS — K5730 Diverticulosis of large intestine without perforation or abscess without bleeding: Secondary | ICD-10-CM | POA: Diagnosis not present

## 2024-10-20 DIAGNOSIS — R918 Other nonspecific abnormal finding of lung field: Secondary | ICD-10-CM | POA: Diagnosis not present

## 2024-10-20 DIAGNOSIS — R0602 Shortness of breath: Secondary | ICD-10-CM | POA: Insufficient documentation

## 2024-10-20 DIAGNOSIS — N184 Chronic kidney disease, stage 4 (severe): Secondary | ICD-10-CM | POA: Diagnosis not present

## 2024-10-20 DIAGNOSIS — Z853 Personal history of malignant neoplasm of breast: Secondary | ICD-10-CM | POA: Insufficient documentation

## 2024-10-20 DIAGNOSIS — R5383 Other fatigue: Secondary | ICD-10-CM | POA: Diagnosis not present

## 2024-10-20 DIAGNOSIS — R0989 Other specified symptoms and signs involving the circulatory and respiratory systems: Secondary | ICD-10-CM | POA: Diagnosis not present

## 2024-10-20 DIAGNOSIS — I517 Cardiomegaly: Secondary | ICD-10-CM | POA: Diagnosis not present

## 2024-10-20 DIAGNOSIS — K8020 Calculus of gallbladder without cholecystitis without obstruction: Secondary | ICD-10-CM | POA: Diagnosis not present

## 2024-10-20 LAB — RESP PANEL BY RT-PCR (RSV, FLU A&B, COVID)  RVPGX2
Influenza A by PCR: NEGATIVE
Influenza B by PCR: NEGATIVE
Resp Syncytial Virus by PCR: NEGATIVE
SARS Coronavirus 2 by RT PCR: NEGATIVE

## 2024-10-20 LAB — TROPONIN T, HIGH SENSITIVITY
Troponin T High Sensitivity: 47 ng/L — ABNORMAL HIGH (ref 0–19)
Troponin T High Sensitivity: 42 ng/L — ABNORMAL HIGH (ref 0–19)

## 2024-10-20 LAB — CBC
HCT: 30 % — ABNORMAL LOW (ref 36.0–46.0)
Hemoglobin: 9.7 g/dL — ABNORMAL LOW (ref 12.0–15.0)
MCH: 28.6 pg (ref 26.0–34.0)
MCHC: 32.3 g/dL (ref 30.0–36.0)
MCV: 88.5 fL (ref 80.0–100.0)
Platelets: 278 K/uL (ref 150–400)
RBC: 3.39 MIL/uL — ABNORMAL LOW (ref 3.87–5.11)
RDW: 16 % — ABNORMAL HIGH (ref 11.5–15.5)
WBC: 8.3 K/uL (ref 4.0–10.5)
nRBC: 0 % (ref 0.0–0.2)

## 2024-10-20 LAB — BASIC METABOLIC PANEL WITH GFR
Anion gap: 16 — ABNORMAL HIGH (ref 5–15)
BUN: 44 mg/dL — ABNORMAL HIGH (ref 8–23)
CO2: 18 mmol/L — ABNORMAL LOW (ref 22–32)
Calcium: 9.4 mg/dL (ref 8.9–10.3)
Chloride: 109 mmol/L (ref 98–111)
Creatinine, Ser: 3.8 mg/dL — ABNORMAL HIGH (ref 0.44–1.00)
GFR, Estimated: 12 mL/min — ABNORMAL LOW (ref >60)
Glucose, Bld: 98 mg/dL (ref 70–99)
Potassium: 3.4 mmol/L — ABNORMAL LOW (ref 3.5–5.1)
Sodium: 143 mmol/L (ref 135–145)

## 2024-10-20 MED ORDER — ONDANSETRON HCL 4 MG/2ML IJ SOLN
4.0000 mg | Freq: Once | INTRAMUSCULAR | Status: AC
  Administered 2024-10-20: 4 mg via INTRAVENOUS
  Filled 2024-10-20 (×2): qty 2

## 2024-10-20 MED ORDER — ONDANSETRON 4 MG PO TBDP: 4.0000 mg | ORAL_TABLET | Freq: Three times a day (TID) | ORAL | 0 refills | Status: DC | PRN

## 2024-10-20 MED ORDER — SODIUM CHLORIDE 0.9 % IV BOLUS
500.0000 mL | Freq: Once | INTRAVENOUS | Status: AC
  Administered 2024-10-20: 500 mL via INTRAVENOUS

## 2024-10-20 NOTE — ED Notes (Signed)
 Lunch bag provided and ginger ale

## 2024-10-20 NOTE — Telephone Encounter (Signed)
 Copied from CRM [id number]. Topic: General - Other >> Oct 20, 2024  4:35 PM Santiya F wrote: Reason for CRM: Patient is calling in because she was unable to make her appointment today due to her not feeling well. Patient says she is going to go to an urgent care, but wants to know if she will be charged for missing her appointment today. Please advise.

## 2024-10-20 NOTE — ED Notes (Signed)
 Ambulated independently to bathroom and b ack with her cane

## 2024-10-20 NOTE — ED Triage Notes (Signed)
 Pt comes in via pov with complaints of shortness of breath, a cough and weakness for about a week. Pt has no complaints of pain at this time, but complains of overall discomfort. Pt is alert and oriented x4.

## 2024-10-20 NOTE — ED Provider Notes (Signed)
 [facility] Provider Note    Event Date/Time   First MD Initiated Contact with Patient 10/20/24 1826     (approximate)   History   Chief Complaint: Shortness of Breath   HPI  Sarah [name] is a 73 y.o. female with a history of hypertension, CKD who comes ED complaining of shortness of breath, fatigue, loss of appetite, nonproductive cough for the past week.  No fever or chills.  No vomiting or diarrhea.  No chest pain.        Past Medical History:  Diagnosis Date   Adrenal adenoma, right    Anemia of chronic renal failure    Anxiety    Aortic atherosclerosis    Arthritis    Benign neoplasm of breast 2012   CAD (coronary artery disease)    Cerebral aneurysm 2004   a.) s/p SAH with subsequent cerebral aneurysm coiling on 06/28/2003 --> Sapphire 10 coils were placed to a 4 mm PCA, 2 mm anterior choroidal, and 3 mm superior hypophyseal artery aneurysms; b.) s/p coiling of a 4 mm RIGHT ICA aneurysm on 10/29/2003   Cholelithiasis    Diastolic dysfunction    Diverticulosis    Dyspnea    Emphysema lung (HCC)    ESRD (end stage renal disease) on dialysis [facility])    a.) s/p LEFT nephrectomy for RCC 01/01/2017   Former smoker    Headache    HLD (hyperlipidemia)    Hypertension    Obesity, unspecified    Pneumonia    Pulmonary hypertension associated with end-stage renal disease (ESRD) on dialysis [facility])    Renal cell cancer, left (HCC) 2018   a.) s/p nephrectomy 01/01/2017   SAH (subarachnoid hemorrhage) (HCC) 06/27/2003   a.) s/p cerebral aneurysm coiling on 06/28/2003 --> Sapphire 10 coils were placed to a 4 mm PCA, 2 mm anterior choroidal, and 3 mm superior hypophyseal artery aneurysms    Current Outpatient Rx   Order #: [id number] Class: Normal   Order #: [id number] Class: Historical Med   Order #: [id number] Class: Historical Med   Order #: [id number] Class: Normal   Order #: [id number] Class: Normal   Order #: [id number] Class: Normal   Order #:  [id number] Class: Historical Med   Order #: [id number] Class: Normal   Order #: [id number] Class: Historical Med   Order #: [id number] Class: Normal   Order #: [id number] Class: Normal   Order #: [id number] Class: Historical Med   Order #: [id number] Class: Normal   Order #: [id number] Class: Historical Med    Past Surgical History:  Procedure Laterality Date   ABDOMINAL HYSTERECTOMY  1993   ANEURYSM COILING Left 06/28/2003   ANEURYSM COILING Right 10/29/2003   AV FISTULA PLACEMENT Left 08/16/2024   Procedure: ARTERIOVENOUS (AV) FISTULA CREATION;  Surgeon: Jama Cordella MATSU, MD;  Location: [facility] ORS;  Service: Vascular;  Laterality: Left;  BRACHIOCEPHALIC   BREAST BIOPSY Right 2012   stereo bx showing a 6mm radial scar in an estimated 8 volume of tissue   BREAST BIOPSY Right 04/09/2005   Fibrocystic changes, ductal adenosis, microcalcifications, focal stromal/epithelial proliferation, possible fibroadenoma type proliferation   COLONOSCOPY  2008   Dr. Chipper   EYE SURGERY     as a child   FOOT SURGERY Right 2003   LAPAROSCOPIC NEPHRECTOMY, HAND ASSISTED Left 01/01/2017   Procedure: HAND ASSISTED LAPAROSCOPIC NEPHRECTOMY;  Surgeon: Redell Lynwood Napoleon, MD;  Location: [facility] ORS;  Service: Urology;  Laterality: Left;   TONSILLECTOMY  1997    Physical Exam   Triage Vital Signs: ED  Triage Vitals  Encounter Vitals Group     BP 10/20/24 1756 (!) 162/89     Girls Systolic BP Percentile --      Girls Diastolic BP Percentile --      Boys Systolic BP Percentile --      Boys Diastolic BP Percentile --      Pulse Rate 10/20/24 1756 89     Resp 10/20/24 1756 19     Temp 10/20/24 1756 98.1 F (36.7 C)     Temp src --      SpO2 10/20/24 1756 95 %     Weight 10/20/24 1753 180 lb (81.6 kg)     Height 10/20/24 1753 5' 6 (1.676 m)     Head Circumference --      Peak Flow --      Pain Score 10/20/24 1753 0     Pain Loc --      Pain Education --      Exclude from Growth Chart --     Most recent vital  signs: Vitals:   10/20/24 1756 10/20/24 2236  BP: (!) 162/89 (!) 163/76  Pulse: 89 81  Resp: 19 17  Temp: 98.1 F (36.7 C) 98.3 F (36.8 C)  SpO2: 95% 93%    General: Awake, no distress.  CV:  Good peripheral perfusion.  Regular rate rhythm.  Normal distal pulses Resp:  Normal effort.  Clear lungs Abd:  No distention.  Soft with mild left lower quadrant tenderness Other:  No lower extremity edema.  Dry oral mucosa.   ED Results / Procedures / Treatments   Labs (all labs ordered are listed, but only abnormal results are displayed) Labs Reviewed  BASIC METABOLIC PANEL WITH GFR - Abnormal; Notable for the following components:      Result Value   Potassium 3.4 (*)    CO2 18 (*)    BUN 44 (*)    Creatinine, Ser 3.80 (*)    GFR, Estimated 12 (*)    Anion gap 16 (*)    All other components within normal limits  CBC - Abnormal; Notable for the following components:   RBC 3.39 (*)    Hemoglobin 9.7 (*)    HCT 30.0 (*)    RDW 16.0 (*)    All other components within normal limits  TROPONIN T, HIGH SENSITIVITY - Abnormal; Notable for the following components:   Troponin T High Sensitivity 47 (*)    All other components within normal limits  TROPONIN T, HIGH SENSITIVITY - Abnormal; Notable for the following components:   Troponin T High Sensitivity 42 (*)    All other components within normal limits  RESP PANEL BY RT-PCR (RSV, FLU A&B, COVID)  [id number]     EKG    RADIOLOGY Chest x-ray interpreted by me, normal.  Radiology report reviewed  CT abdomen pelvis unremarkable    PROCEDURES:  Procedures   MEDICATIONS ORDERED IN ED: Medications  sodium chloride  0.9 % bolus 500 mL (0 mLs Intravenous Stopped 10/20/24 2247)  ondansetron  (ZOFRAN ) injection 4 mg (4 mg Intravenous Given 10/20/24 2133)     IMPRESSION / MDM / ASSESSMENT AND PLAN / ED COURSE  I reviewed the triage vital signs and the nursing notes.  DDx: Diverticulitis, pneumonia, COVID, influenza, AKI,  anemia, electrolyte derangement, non-STEMI.  Doubt PE  Patient's presentation is most consistent with acute presentation with potential threat to life or bodily function.  Patient presents with malaise and fatigue, other vague symptoms.  Ongoing for  a week.  Vital signs unremarkable.  Exam reassuring.  Labs including serial troponins are unremarkable, baseline CKD.  Chest x-ray and CT abdomen pelvis unremarkable.  After gentle IV fluids, Zofran  she is feeling much better, tolerating oral intake, sitting upright.  Stable for discharge.  Suspect viral illness.       FINAL CLINICAL IMPRESSION(S) / ED DIAGNOSES   Final diagnoses:  Malaise and fatigue     Rx / DC Orders   ED Discharge Orders          Ordered    ondansetron  (ZOFRAN -ODT) 4 MG disintegrating tablet  Every 8 hours PRN        10/20/24 2254             Note:  This document was prepared using Dragon voice recognition software and may include unintentional dictation errors.   Viviann Pastor, MD 10/21/24 0000

## 2024-10-25 ENCOUNTER — Ambulatory Visit: Payer: Self-pay

## 2024-10-25 ENCOUNTER — Other Ambulatory Visit: Payer: Self-pay | Admitting: Nurse Practitioner

## 2024-10-25 MED ORDER — BENZONATATE 100 MG PO CAPS: 100.0000 mg | ORAL_CAPSULE | Freq: Two times a day (BID) | ORAL | 0 refills | Status: DC | PRN

## 2024-10-25 NOTE — Telephone Encounter (Signed)
Called and notified patient of providers message. Patient verbalized understanding.  

## 2024-10-25 NOTE — Telephone Encounter (Signed)
 FYI Only or Action Required?: Action required by provider: medication refill request. Scheduled for soonest ED f/u visit on 12/3. Advised UC in next 4 hours in the meantime until she can attend f/u appt in a week. Pt requesting to have cough medicine prescribed to pharmacy in the meantime.  Patient was last seen in primary care on 07/12/2024 by Valerio Melanie DASEN, NP.  Called Nurse Triage reporting Shortness of Breath and Cough (/).  Symptoms began several weeks ago.  Interventions attempted: Nothing.  Symptoms are: unchanged.  Triage Disposition: See HCP Within 4 Hours (Or PCP Triage)  Patient/caregiver understands and will follow disposition?: No, refuses disposition  Copied from CRM #[id number]. Topic: Clinical - Red Word Triage >> Oct 25, 2024  2:31 PM Sarah [name] wrote: Red Word that prompted transfer to Nurse Triage: Shortness of breath and coughing Reason for Disposition  [1] Ankle swelling AND [2] swelling is increasing  Answer Assessment - Initial Assessment Questions Pt went to ED on 11/21 for mild SOB with exertion, mild dry cough and general malaise x2 weeks. Calling to make ED f/u visit with PCP. Reports symptoms have not changed since ED visit. Also reports new onset moderate swelling of both feet x2 weeks. Denies CP. Denies hx of CHF, does take lasix . Scheduled for soonest ED f/u visit on 12/3 with PCP. Advised UC in next 4 hours in the meantime until she can attend f/u appt in a week. Declines stating she won't have transportation until Friday. Agreeable to go on Friday. Also asking if PCP can prescribe a cough medicine. Sending request to office. Advised UC or ED for worsening symptoms.   1. ONSET: When did the cough begin?      2 weeks ago. Started out as a cold  2. SEVERITY: How bad is the cough today?      Mild-moderate  3. SPUTUM: Describe the color of your sputum (e.g., none, dry cough; clear, white, yellow, green)     Dry  4. HEMOPTYSIS: Are you coughing up  any blood? If Yes, ask: How much? (e.g., flecks, streaks, tablespoons, etc.)     Denies  5. DIFFICULTY BREATHING: Are you having difficulty breathing? If Yes, ask: How bad is it? (e.g., mild, moderate, severe)      Yes. Dyspnea with exertion, mild  6. FEVER: Do you have a fever? If Yes, ask: What is your temperature, how was it measured, and when did it start?     Denies  7. CARDIAC HISTORY: Do you have any history of heart disease? (e.g., heart attack, congestive heart failure)      Htn  8. LUNG HISTORY: Do you have any history of lung disease?  (e.g., pulmonary embolus, asthma, emphysema)     Denies  9. PE RISK FACTORS: Do you have a history of blood clots? (or: recent major surgery, recent prolonged travel, bedridden)     Denies  10. OTHER SYMPTOMS: Do you have any other symptoms? (e.g., runny nose, wheezing, chest pain)       Denies CP or wheezing. Has runny nose.  Protocols used: Cough - Acute Non-Productive-A-AH

## 2024-10-27 DIAGNOSIS — I770 Arteriovenous fistula, acquired: Secondary | ICD-10-CM | POA: Insufficient documentation

## 2024-10-27 NOTE — Patient Instructions (Incomplete)
 Be Involved in Caring For Your Health:  Taking Medications When medications are taken as directed, they can greatly improve your health. But if they are not taken as prescribed, they may not work. In some cases, not taking them correctly can be harmful. To help ensure your treatment remains effective and safe, understand your medications and how to take them. Bring your medications to each visit for review by your provider.  Your lab results, notes, and after visit summary will be available on My Chart. We strongly encourage you to use this feature. If lab results are abnormal the clinic will contact you with the appropriate steps. If the clinic does not contact you assume the results are satisfactory. You can always view your results on My Chart. If you have questions regarding your health or results, please contact the clinic during office hours. You can also ask questions on My Chart.  We at [facility] are grateful that you chose [place] to provide your care. We strive to provide evidence-based and compassionate care and are always looking for feedback. If you get a survey from the clinic please complete this so we can hear your opinions.  Heart-Healthy Eating Plan Many factors influence your heart health, including eating and exercise habits. Heart health is also called coronary health. Coronary risk increases with abnormal blood fat (lipid) levels. A heart-healthy eating plan includes limiting unhealthy fats, increasing healthy fats, limiting salt (sodium) intake, and making other diet and lifestyle changes. What is my plan? Your health care provider may recommend that: You limit your fat intake to _________% or less of your total calories each day. You limit your saturated fat intake to _________% or less of your total calories each day. You limit the amount of cholesterol in your diet to less than _________ mg per day. You limit the amount of sodium in your diet to less than _________  mg per day. What are tips for following this plan? Cooking Cook foods using methods other than frying. Baking, boiling, grilling, and broiling are all good options. Other ways to reduce fat include: Removing the skin from poultry. Removing all visible fats from meats. Steaming vegetables in water or broth. Meal planning  At meals, imagine dividing your plate into fourths: Fill one-half of your plate with vegetables and green salads. Fill one-fourth of your plate with whole grains. Fill one-fourth of your plate with lean protein foods. Eat 2-4 cups of vegetables per day. One cup of vegetables equals 1 cup (91 g) broccoli or cauliflower florets, 2 medium carrots, 1 large bell pepper, 1 large sweet potato, 1 large tomato, 1 medium white potato, 2 cups (150 g) raw leafy greens. Eat 1-2 cups of fruit per day. One cup of fruit equals 1 small apple, 1 large banana, 1 cup (237 g) mixed fruit, 1 large orange,  cup (82 g) dried fruit, 1 cup (240 mL) 100% fruit juice. Eat more foods that contain soluble fiber. Examples include apples, broccoli, carrots, beans, peas, and barley. Aim to get 25-30 g of fiber per day. Increase your consumption of legumes, nuts, and seeds to 4-5 servings per week. One serving of dried beans or legumes equals  cup (90 g) cooked, 1 serving of nuts is  oz (12 almonds, 24 pistachios, or 7 walnut halves), and 1 serving of seeds equals  oz (8 g). Fats Choose healthy fats more often. Choose monounsaturated and polyunsaturated fats, such as olive and canola oils, avocado oil, flaxseeds, walnuts, almonds, and seeds. Eat  more omega-3 fats. Choose salmon, mackerel, sardines, tuna, flaxseed oil, and ground flaxseeds. Aim to eat fish at least 2 times each week. Check food labels carefully to identify foods with trans fats or high amounts of saturated fat. Limit saturated fats. These are found in animal products, such as meats, butter, and cream. Plant sources of saturated fats  include palm oil, palm kernel oil, and coconut oil. Avoid foods with partially hydrogenated oils in them. These contain trans fats. Examples are stick margarine, some tub margarines, cookies, crackers, and other baked goods. Avoid fried foods. General information Eat more home-cooked food and less restaurant, buffet, and fast food. Limit or avoid alcohol. Limit foods that are high in added sugar and simple starches such as foods made using white refined flour (white breads, pastries, sweets). Lose weight if you are overweight. Losing just 5-10% of your body weight can help your overall health and prevent diseases such as diabetes and heart disease. Monitor your sodium intake, especially if you have high blood pressure. Talk with your health care provider about your sodium intake. Try to incorporate more vegetarian meals weekly. What foods should I eat? Fruits All fresh, canned (in natural juice), or frozen fruits. Vegetables Fresh or frozen vegetables (raw, steamed, roasted, or grilled). Green salads. Grains Most grains. Choose whole wheat and whole grains most of the time. Rice and pasta, including brown rice and pastas made with whole wheat. Meats and other proteins Lean, well-trimmed beef, veal, pork, and lamb. Chicken and [place] without skin. All fish and shellfish. Wild duck, rabbit, pheasant, and venison. Egg whites or low-cholesterol egg substitutes. Dried beans, peas, lentils, and tofu. Seeds and most nuts. Dairy Low-fat or nonfat cheeses, including ricotta and mozzarella. Skim or 1% milk (liquid, powdered, or evaporated). Buttermilk made with low-fat milk. Nonfat or low-fat yogurt. Fats and oils Non-hydrogenated (trans-free) margarines. Vegetable oils, including soybean, sesame, sunflower, olive, avocado, peanut, safflower, corn, canola, and cottonseed. Salad dressings or mayonnaise made with a vegetable oil. Beverages Water (mineral or sparkling). Coffee and tea. Unsweetened ice  tea. Diet beverages. Sweets and desserts Sherbet, gelatin, and fruit ice. Small amounts of dark chocolate. Limit all sweets and desserts. Seasonings and condiments All seasonings and condiments. The items listed above may not be a complete list of foods and beverages you can eat. Contact a dietitian for more options. What foods should I avoid? Fruits Canned fruit in heavy syrup. Fruit in cream or butter sauce. Fried fruit. Limit coconut. Vegetables Vegetables cooked in cheese, cream, or butter sauce. Fried vegetables. Grains Breads made with saturated or trans fats, oils, or whole milk. Croissants. Sweet rolls. Donuts. High-fat crackers, such as cheese crackers and chips. Meats and other proteins Fatty meats, such as hot dogs, ribs, sausage, bacon, rib-eye roast or steak. High-fat deli meats, such as salami and bologna. Caviar. Domestic duck and goose. Organ meats, such as liver. Dairy Cream, sour cream, cream cheese, and creamed cottage cheese. Whole-milk cheeses. Whole or 2% milk (liquid, evaporated, or condensed). Whole buttermilk. Cream sauce or high-fat cheese sauce. Whole-milk yogurt. Fats and oils Meat fat, or shortening. Cocoa butter, hydrogenated oils, palm oil, coconut oil, palm kernel oil. Solid fats and shortenings, including bacon fat, salt pork, lard, and butter. Nondairy cream substitutes. Salad dressings with cheese or sour cream. Beverages Regular sodas and any drinks with added sugar. Sweets and desserts Frosting. Pudding. Cookies. Cakes. Pies. Milk chocolate or white chocolate. Buttered syrups. Full-fat ice cream or ice cream drinks. The items listed above may  not be a complete list of foods and beverages to avoid. Contact a dietitian for more information. Summary Heart-healthy meal planning includes limiting unhealthy fats, increasing healthy fats, limiting salt (sodium) intake and making other diet and lifestyle changes. Lose weight if you are overweight. Losing just  5-10% of your body weight can help your overall health and prevent diseases such as diabetes and heart disease. Focus on eating a balance of foods, including fruits and vegetables, low-fat or nonfat dairy, lean protein, nuts and legumes, whole grains, and heart-healthy oils and fats. This information is not intended to replace advice given to you by your health care provider. Make sure you discuss any questions you have with your health care provider. Document Revised: 12/22/2021 Document Reviewed: 12/22/2021 Elsevier Patient Education  2024 [employer].

## 2024-11-01 ENCOUNTER — Ambulatory Visit: Admitting: Nurse Practitioner

## 2024-11-01 ENCOUNTER — Telehealth: Admitting: Nurse Practitioner

## 2024-11-01 ENCOUNTER — Encounter: Payer: Self-pay | Admitting: Nurse Practitioner

## 2024-11-01 VITALS — BP 128/68 | HR 73 | Temp 98.4°F | Resp 14 | Ht 65.98 in | Wt 189.6 lb

## 2024-11-01 DIAGNOSIS — J439 Emphysema, unspecified: Secondary | ICD-10-CM

## 2024-11-01 DIAGNOSIS — E7800 Pure hypercholesterolemia, unspecified: Secondary | ICD-10-CM | POA: Diagnosis not present

## 2024-11-01 DIAGNOSIS — I272 Pulmonary hypertension, unspecified: Secondary | ICD-10-CM

## 2024-11-01 DIAGNOSIS — D631 Anemia in chronic kidney disease: Secondary | ICD-10-CM | POA: Diagnosis not present

## 2024-11-01 DIAGNOSIS — C642 Malignant neoplasm of left kidney, except renal pelvis: Secondary | ICD-10-CM | POA: Diagnosis not present

## 2024-11-01 DIAGNOSIS — I714 Abdominal aortic aneurysm, without rupture, unspecified: Secondary | ICD-10-CM | POA: Insufficient documentation

## 2024-11-01 DIAGNOSIS — N186 End stage renal disease: Secondary | ICD-10-CM

## 2024-11-01 DIAGNOSIS — K8020 Calculus of gallbladder without cholecystitis without obstruction: Secondary | ICD-10-CM | POA: Insufficient documentation

## 2024-11-01 DIAGNOSIS — I1310 Hypertensive heart and chronic kidney disease without heart failure, with stage 1 through stage 4 chronic kidney disease, or unspecified chronic kidney disease: Secondary | ICD-10-CM | POA: Diagnosis not present

## 2024-11-01 DIAGNOSIS — I770 Arteriovenous fistula, acquired: Secondary | ICD-10-CM

## 2024-11-01 DIAGNOSIS — N184 Chronic kidney disease, stage 4 (severe): Secondary | ICD-10-CM | POA: Diagnosis not present

## 2024-11-01 DIAGNOSIS — I7143 Infrarenal abdominal aortic aneurysm, without rupture: Secondary | ICD-10-CM | POA: Diagnosis not present

## 2024-11-01 DIAGNOSIS — I517 Cardiomegaly: Secondary | ICD-10-CM | POA: Insufficient documentation

## 2024-11-01 DIAGNOSIS — F324 Major depressive disorder, single episode, in partial remission: Secondary | ICD-10-CM

## 2024-11-01 DIAGNOSIS — Z992 Dependence on renal dialysis: Secondary | ICD-10-CM | POA: Diagnosis not present

## 2024-11-01 DIAGNOSIS — K529 Noninfective gastroenteritis and colitis, unspecified: Secondary | ICD-10-CM | POA: Diagnosis not present

## 2024-11-01 NOTE — Assessment & Plan Note (Signed)
 Noted on imaging with diarrhea after eating for 2 years, GI referral placed.

## 2024-11-01 NOTE — Assessment & Plan Note (Signed)
 Placed 08/16/24 in preparation for possible future dialysis, continue to collaborate with nephrology.

## 2024-11-01 NOTE — Assessment & Plan Note (Signed)
Noted possible on past imaging, continue current medication regimen and collaboration with cardiology.

## 2024-11-01 NOTE — Assessment & Plan Note (Signed)
Followed by Dr. Orlie Dakin in past, has been discharged at this time.  Continue collaboration as needed and review of notes.  Continue to follow with nephrology and urology as scheduled.

## 2024-11-01 NOTE — Assessment & Plan Note (Signed)
 Chronic, ongoing. BP at goal for age in office today on recheck.  Followed by nephrology. Recommend she continue to check at home and monitor diet, DASH diet. Continue current medication regimen and adjust as needed + collaboration with nephrology.  Recent labs with nephrology obtained and reviewed today.  Check labs today to follow-up on ER.

## 2024-11-01 NOTE — Assessment & Plan Note (Signed)
 Chronic, ongoing.  Continue current medication regimen and adjust as needed. Lipid panel today.

## 2024-11-01 NOTE — Progress Notes (Signed)
 BP 128/68 (BP Location: Left Arm, Patient Position: Sitting)   Pulse 73   Temp 98.4 F (36.9 C) (Oral)   Resp 14   Ht 5' 5.98 (1.676 m)   Wt 189 lb 9.6 oz (86 kg)   SpO2 98%   BMI 30.62 kg/m    Subjective:    Patient ID: Sarah [name], female    DOB: 08/13/1951, 73 y.o.   MRN: [id number]  HPI: [name] is a 73 y.o. female  Chief Complaint  Patient presents with   Hospitalization Follow-up    Malaise and fatigue. Feels better overall but still having coughing spells and uses medicine (dayquil with honey OTC) to help with this. No longer having the dizzy spells.    Foot Swelling    Both feet not to the ankle.    Diarrhea    Feels she has to go right after she eats.    Presents with sister today. GI referral needed and contact Dr. Perla  ER FOLLOW UP Seen in ER on 10/20/24 for fatigue, coughing, and edema. Labs showed ongoing CKD 4 which she follows nephrology for, last visit 08/22/24. K+ was 3.4, CBC continued to show lower H/H. Follows with cardiology and saw them last on 04/17/24. She had an AV fistula placed to left arm for possible future need for dialysis.  For 2 years has been having issues with bowels, sometimes after she eats has to go to bathroom right way. This is diarrhea in consistency. Cannot even go to store at times due to this. No pain with this.  No blood in stool. Takes Imodium which helps until she eats again.  CT abdomen noted an abdominal aortic aneurysm measuring 3 cm, to repeat check in 3 years. There was some cholelithiasis and gallbladder sludge. Some findings compatible with CHF. Cardiomegaly on CXR and suspected small bilateral pleural effusions and interstitial edema. Last echo in 2023 noted EF 50 to 55% and LV low normal function. Was given Zofran  in the ER.  Since leaving hospital has continued to have swelling in feet, this has been present for 2 weeks on and off. Reports no further SOB. Feeling better at present, prior she had got to the  point where she did not want to drive. Time since discharge: 12 days Hospital/facility: [facility] Diagnosis: Malaise and fatigue + SOB Procedures/tests: CT scan and labs Consultants: none New medications: Zofran  Discharge instructions:  Follow-up with PCP Status: fluctuating      07/12/2024    3:11 PM 06/29/2024    3:25 PM 01/10/2024   11:46 AM 07/09/2023   10:35 AM 03/29/2023    2:04 PM  Depression screen PHQ 2/9  Decreased Interest 0 0 0 0 0  Down, Depressed, Hopeless 0 0 0 0 0  PHQ - 2 Score 0 0 0 0 0  Altered sleeping 1 1 0 0 0  Tired, decreased energy 0 0 0 0 0  Change in appetite 0 0 0 0 0  Feeling bad or failure about yourself  0 0 0 0 0  Trouble concentrating 0 0 0 0 0  Moving slowly or fidgety/restless 0 0 0 0 0  Suicidal thoughts 0 0 0 0 0  PHQ-9 Score 1  1  0  0  0   Difficult doing work/chores Not difficult at all Not difficult at all  Not difficult at all Not difficult at all     Data saved with a previous flowsheet row definition  07/12/2024    3:11 PM 01/10/2024   11:47 AM 07/09/2023   10:35 AM 01/08/2023    3:01 PM  GAD 7 : Generalized Anxiety Score  Nervous, Anxious, on Edge 0 0 1 1  Control/stop worrying 0 0 1 2  Worry too much - different things 0 0 0 2  Trouble relaxing 0 0 0 1  Restless 0 0 0 0  Easily annoyed or irritable 0 0 0 0  Afraid - awful might happen 0 0 0 1  Total GAD 7 Score 0 0 2 7  Anxiety Difficulty Not difficult at all Not difficult at all Not difficult at all    Relevant past medical, surgical, family and social history reviewed and updated as indicated. Interim medical history since our last visit reviewed. Allergies and medications reviewed and updated.  Review of Systems  Constitutional:  Positive for appetite change (less appetite -- present for 3 months). Negative for activity change, diaphoresis, fatigue and fever.  Respiratory:  Positive for cough (comes and goes). Negative for chest tightness, shortness of breath and wheezing.    Cardiovascular:  Positive for leg swelling (comes and goes). Negative for chest pain and palpitations.  Gastrointestinal:  Positive for diarrhea. Negative for abdominal distention, abdominal pain, blood in stool, constipation, nausea and vomiting.  Neurological: Negative.   Psychiatric/Behavioral: Negative.     Per HPI unless specifically indicated above     Objective:    BP 128/68 (BP Location: Left Arm, Patient Position: Sitting)   Pulse 73   Temp 98.4 F (36.9 C) (Oral)   Resp 14   Ht 5' 5.98 (1.676 m)   Wt 189 lb 9.6 oz (86 kg)   SpO2 98%   BMI 30.62 kg/m   Wt Readings from Last 3 Encounters:  11/01/24 189 lb 9.6 oz (86 kg)  10/20/24 180 lb (81.6 kg)  09/06/24 184 lb 9.6 oz (83.7 kg)    Physical Exam Vitals and nursing note reviewed.  Constitutional:      General: She is awake. She is not in acute distress.    Appearance: She is well-developed and well-groomed. She is not ill-appearing or toxic-appearing.  HENT:     Head: Normocephalic.     Right Ear: Hearing and external ear normal.     Left Ear: Hearing and external ear normal.  Eyes:     General: Lids are normal.        Right eye: No discharge.        Left eye: No discharge.     Conjunctiva/sclera: Conjunctivae normal.     Pupils: Pupils are equal, round, and reactive to light.  Neck:     Thyroid : No thyromegaly.     Vascular: No carotid bruit.  Cardiovascular:     Rate and Rhythm: Normal rate and regular rhythm.     Heart sounds: Normal heart sounds. No murmur heard.    No gallop.  Pulmonary:     Effort: Pulmonary effort is normal. No accessory muscle usage or respiratory distress.     Breath sounds: Normal breath sounds. No decreased breath sounds, wheezing or rales.  Abdominal:     General: Bowel sounds are normal. There is no distension.     Palpations: Abdomen is soft. There is no mass.     Tenderness: There is no abdominal tenderness. There is no right CVA tenderness or left CVA tenderness.   Musculoskeletal:     Cervical back: Normal range of motion and neck supple.  Right lower leg: 1+ Pitting Edema present.     Left lower leg: 1+ Pitting Edema present.  Lymphadenopathy:     Cervical: No cervical adenopathy.  Skin:    General: Skin is warm and dry.  Neurological:     Mental Status: She is alert and oriented to person, place, and time.     Deep Tendon Reflexes: Reflexes are normal and symmetric.     Reflex Scores:      Brachioradialis reflexes are 2+ on the right side and 2+ on the left side.      Patellar reflexes are 2+ on the right side and 2+ on the left side. Psychiatric:        Attention and Perception: Attention normal.        Mood and Affect: Mood normal.        Speech: Speech normal.        Behavior: Behavior normal. Behavior is cooperative.        Thought Content: Thought content normal.    Results for orders placed or performed during the hospital encounter of 10/20/24  Basic metabolic panel   Collection Time: 10/20/24  6:12 PM  Result Value Ref Range   Sodium 143 135 - 145 mmol/L   Potassium 3.4 (L) 3.5 - 5.1 mmol/L   Chloride 109 98 - 111 mmol/L   CO2 18 (L) 22 - 32 mmol/L   Glucose, Bld 98 70 - 99 mg/dL   BUN 44 (H) 8 - 23 mg/dL   Creatinine, Ser 6.19 (H) 0.44 - 1.00 mg/dL   Calcium  9.4 8.9 - 10.3 mg/dL   GFR, Estimated 12 (L) >60 mL/min   Anion gap 16 (H) 5 - 15  CBC   Collection Time: 10/20/24  6:12 PM  Result Value Ref Range   WBC 8.3 4.0 - 10.5 K/uL   RBC 3.39 (L) 3.87 - 5.11 MIL/uL   Hemoglobin 9.7 (L) 12.0 - 15.0 g/dL   HCT 69.9 (L) 63.9 - 53.9 %   MCV 88.5 80.0 - 100.0 fL   MCH 28.6 26.0 - 34.0 pg   MCHC 32.3 30.0 - 36.0 g/dL   RDW 83.9 (H) 88.4 - 84.4 %   Platelets 278 150 - 400 K/uL   nRBC 0.0 0.0 - 0.2 %  Troponin T, High Sensitivity   Collection Time: 10/20/24  6:12 PM  Result Value Ref Range   Troponin T High Sensitivity 47 (H) 0 - 19 ng/L  Resp panel by RT-PCR (RSV, Flu A&B, Covid) Anterior Nasal Swab   Collection  Time: 10/20/24  6:48 PM   Specimen: Anterior Nasal Swab  Result Value Ref Range   SARS Coronavirus 2 by RT PCR NEGATIVE NEGATIVE   Influenza A by PCR NEGATIVE NEGATIVE   Influenza B by PCR NEGATIVE NEGATIVE   Resp Syncytial Virus by PCR NEGATIVE NEGATIVE  Troponin T, High Sensitivity   Collection Time: 10/20/24  9:27 PM  Result Value Ref Range   Troponin T High Sensitivity 42 (H) 0 - 19 ng/L      Assessment & Plan:   Problem List Items Addressed This Visit       Cardiovascular and Mediastinum   Pulmonary hypertension, unspecified (HCC)   Noted possible on past imaging, continue current medication regimen and collaboration with cardiology.      Relevant Orders   B Nat Peptide   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 chronic kidney disease stage IV (HCC)   Chronic, ongoing. BP at  goal for age in office today on recheck.  Followed by nephrology. Recommend she continue to check at home and monitor diet, DASH diet. Continue current medication regimen and adjust as needed + collaboration with nephrology.  Recent labs with nephrology obtained and reviewed today.  Check labs today to follow-up on ER.      Relevant Orders   CBC with Differential/Platelet   Comprehensive metabolic panel with GFR   TSH   Cardiomegaly   Noted on CXR 10/20/24, along with other findings correlating to possible CHF. She does have newer onset edema to ankles, but no abdominal distension and lungs clear today. Recommend she increase Lasix  to 60 MG daily over the next 4 days. Check labs today, including BNP. Will order repeat echo. Alerted her cardiologist via secure message to recent findings and plan. Recommend she: - Weight daily. Reminded to call for an overnight weight gain of >2 pounds or a weekly weight gain of >5 pounds - not adding salt to food and read food labels. Reviewed the importance of keeping daily sodium intake to 2000mg  daily.  - Strict ER precautions given.       Relevant Orders   ECHOCARDIOGRAM COMPLETE   AV fistula   Placed 08/16/24 in preparation for possible future dialysis, continue to collaborate with nephrology.      AAA (abdominal aortic aneurysm)   Noted on imaging 10/20/24, 3 cm. New diagnosis. Educated patient and her sister on this. Goal is to maintain BP <130/80 and lipid levels with LDL <55. Will plan on repeat imaging in 3 years, sooner if symptoms present.        Respiratory   Pulmonary emphysema (HCC)   Chronic, stable without inhalers at this time.  Recommend complete cessation of smoking.  Spirometry March 2023 noting FEV1 96% and FEV1/FVC 112% -- reviewed with patient at the time.  Continue yearly lung screening + collaboration with cardiology.  Recommend she return for lung screening, refuses at this time.        Digestive   Chronic diarrhea   Ongoing for 2 years, Imodium helps some. Due to her history will get her into GI for further assessment. ?if related to gallstones.      Relevant Orders   Ambulatory referral to Gastroenterology   Cholelithiasis   Noted on imaging with diarrhea after eating for 2 years, GI referral placed.        Genitourinary   Renal cancer, left (HCC)   Followed by Dr. Jacobo in past, has been discharged at this time.  Continue collaboration as needed and review of notes.  Continue to follow with nephrology and urology as scheduled.      ESRD needing dialysis (HCC) - Primary   Chronic, ongoing.  Recent labs obtained with nephrology.  Continue current medication regimen and collaboration with nephrology.   Reviewed recent labs and note. AV fistula placed recently for possible future use.      Anemia in chronic kidney disease   Chronic, ongoing.  Followed by nephrology -- recent labs reviewed and note. Check iron  and ferritin + CBC today.      Relevant Orders   Ferritin   Iron      Other   Hyperlipidemia   Chronic, ongoing.  Continue current medication regimen and adjust as needed.  Lipid panel today.      Relevant Orders   Lipid Panel w/o Chol/HDL Ratio     Follow up plan: Return in about 2 weeks (around 11/15/2024) for HF and EDEMA.

## 2024-11-01 NOTE — Telephone Encounter (Unsigned)
 Copied from CRM #[id number]. Topic: General - Other >> Nov 01, 2024 11:25 AM Alfonso HERO wrote: Reason for CRM: PA for echo is needed before she can be scheduled with [facility]. Please call Arland back at [phone]

## 2024-11-01 NOTE — Assessment & Plan Note (Signed)
 Chronic, ongoing.  Followed by nephrology -- recent labs reviewed and note. Check iron  and ferritin + CBC today.

## 2024-11-01 NOTE — Assessment & Plan Note (Signed)
 Chronic, stable without inhalers at this time.  Recommend complete cessation of smoking.  Spirometry March 2023 noting FEV1 96% and FEV1/FVC 112% -- reviewed with patient at the time.  Continue yearly lung screening + collaboration with cardiology.  Recommend she return for lung screening, refuses at this time.

## 2024-11-01 NOTE — Assessment & Plan Note (Addendum)
 Noted on CXR 10/20/24, along with other findings correlating to possible CHF. She does have newer onset edema to ankles, but no abdominal distension and lungs clear today. Recommend she increase Lasix  to 60 MG daily over the next 4 days. Check labs today, including BNP. Will order repeat echo. Alerted her cardiologist via secure message to recent findings and plan. Recommend she: - Weight daily. Reminded to call for an overnight weight gain of >2 pounds or a weekly weight gain of >5 pounds - not adding salt to food and read food labels. Reviewed the importance of keeping daily sodium intake to 2000mg  daily.  - Strict ER precautions given.

## 2024-11-01 NOTE — Assessment & Plan Note (Signed)
 Chronic, ongoing.  Recent labs obtained with nephrology.  Continue current medication regimen and collaboration with nephrology.   Reviewed recent labs and note. AV fistula placed recently for possible future use.

## 2024-11-01 NOTE — Telephone Encounter (Signed)
 Can you guys assist with this please?

## 2024-11-01 NOTE — Assessment & Plan Note (Signed)
 Noted on imaging 10/20/24, 3 cm. New diagnosis. Educated patient and her sister on this. Goal is to maintain BP <130/80 and lipid levels with LDL <55. Will plan on repeat imaging in 3 years, sooner if symptoms present.

## 2024-11-01 NOTE — Assessment & Plan Note (Signed)
 Ongoing for 2 years, Imodium helps some. Due to her history will get her into GI for further assessment. ?if related to gallstones.

## 2024-11-02 LAB — COMPREHENSIVE METABOLIC PANEL WITH GFR
ALT: 10 IU/L (ref 0–32)
AST: 14 IU/L (ref 0–40)
Albumin: 3.9 g/dL (ref 3.8–4.8)
Alkaline Phosphatase: 66 IU/L (ref 49–135)
BUN/Creatinine Ratio: 12 (ref 12–28)
BUN: 44 mg/dL — ABNORMAL HIGH (ref 8–27)
Bilirubin Total: 0.2 mg/dL (ref 0.0–1.2)
CO2: 16 mmol/L — ABNORMAL LOW (ref 20–29)
Calcium: 9 mg/dL (ref 8.7–10.3)
Chloride: 109 mmol/L — ABNORMAL HIGH (ref 96–106)
Creatinine, Ser: 3.57 mg/dL — ABNORMAL HIGH (ref 0.57–1.00)
Globulin, Total: 2.6 g/dL (ref 1.5–4.5)
Glucose: 91 mg/dL (ref 70–99)
Potassium: 3.7 mmol/L (ref 3.5–5.2)
Sodium: 143 mmol/L (ref 134–144)
Total Protein: 6.5 g/dL (ref 6.0–8.5)
eGFR: 13 mL/min/1.73 — ABNORMAL LOW (ref >59)

## 2024-11-02 LAB — TSH: TSH: 2.75 u[IU]/mL (ref 0.450–4.500)

## 2024-11-02 LAB — FERRITIN: Ferritin: 408 ng/mL — ABNORMAL HIGH (ref 15–150)

## 2024-11-02 LAB — CBC WITH DIFFERENTIAL/PLATELET
Basophils Absolute: 0 x10E3/uL (ref 0.0–0.2)
Basos: 1 %
EOS (ABSOLUTE): 0.2 x10E3/uL (ref 0.0–0.4)
Eos: 2 %
Hematocrit: 30.9 % — ABNORMAL LOW (ref 34.0–46.6)
Hemoglobin: 9.6 g/dL — ABNORMAL LOW (ref 11.1–15.9)
Immature Grans (Abs): 0 x10E3/uL (ref 0.0–0.1)
Immature Granulocytes: 0 %
Lymphocytes Absolute: 1.2 x10E3/uL (ref 0.7–3.1)
Lymphs: 19 %
MCH: 28.5 pg (ref 26.6–33.0)
MCHC: 31.1 g/dL — ABNORMAL LOW (ref 31.5–35.7)
MCV: 92 fL (ref 79–97)
Monocytes Absolute: 0.7 x10E3/uL (ref 0.1–0.9)
Monocytes: 11 %
Neutrophils Absolute: 4.4 x10E3/uL (ref 1.4–7.0)
Neutrophils: 67 %
Platelets: 244 x10E3/uL (ref 150–450)
RBC: 3.37 x10E6/uL — ABNORMAL LOW (ref 3.77–5.28)
RDW: 15.7 % — ABNORMAL HIGH (ref 11.7–15.4)
WBC: 6.6 x10E3/uL (ref 3.4–10.8)

## 2024-11-02 LAB — LIPID PANEL W/O CHOL/HDL RATIO
Cholesterol, Total: 140 mg/dL (ref 100–199)
HDL: 41 mg/dL (ref >39)
LDL Chol Calc (NIH): 86 mg/dL (ref 0–99)
Triglycerides: 62 mg/dL (ref 0–149)
VLDL Cholesterol Cal: 13 mg/dL (ref 5–40)

## 2024-11-02 LAB — IRON: Iron: 26 ug/dL — ABNORMAL LOW (ref 27–139)

## 2024-11-02 LAB — BRAIN NATRIURETIC PEPTIDE: BNP: 1072.8 pg/mL — ABNORMAL HIGH (ref 0.0–100.0)

## 2024-11-03 ENCOUNTER — Ambulatory Visit: Payer: Self-pay | Admitting: Nurse Practitioner

## 2024-11-03 NOTE — Progress Notes (Signed)
 Please call Ireene and let her know labs have returned, she does not check MyChart: - BNP, which I discussed can sometimes look for heart failure, is elevated as I as concerned it might be. Please ensure to take the extra Lasix  as I discussed with you and wrote down + attend the echocardiogram which I see is scheduled on the 12th so we can see how heart is pumping. I did alert Dr. Gollan to your new onset edema and symptoms as well and will alert him to echo results once performed. - Iron  level is a little low, but ferritin a little high. Still some anemia which I suspect is coming from your kidney disease. I would recommend scheduling a follow-up with hematology, Dr. Jacobo, to see if any recommendations for this. They will often work alongside the kidney doctor and myself with this. - Kidney function is similar to when Dr. Dennise checked it recently. - Lipid panel and thyroid  levels are stable. Any questions? Keep being amazing!!  Thank you for allowing me to participate in your care.  I appreciate you. Kindest regards, Talin Feister

## 2024-11-10 ENCOUNTER — Ambulatory Visit
Admission: RE | Admit: 2024-11-10 | Discharge: 2024-11-10 | Attending: Nurse Practitioner | Admitting: Nurse Practitioner

## 2024-11-10 DIAGNOSIS — R0609 Other forms of dyspnea: Secondary | ICD-10-CM | POA: Diagnosis not present

## 2024-11-10 DIAGNOSIS — I517 Cardiomegaly: Secondary | ICD-10-CM | POA: Diagnosis not present

## 2024-11-10 DIAGNOSIS — I3139 Other pericardial effusion (noninflammatory): Secondary | ICD-10-CM | POA: Diagnosis not present

## 2024-11-10 DIAGNOSIS — I080 Rheumatic disorders of both mitral and aortic valves: Secondary | ICD-10-CM | POA: Diagnosis not present

## 2024-11-10 DIAGNOSIS — N184 Chronic kidney disease, stage 4 (severe): Secondary | ICD-10-CM | POA: Diagnosis not present

## 2024-11-10 DIAGNOSIS — F419 Anxiety disorder, unspecified: Secondary | ICD-10-CM | POA: Diagnosis not present

## 2024-11-10 DIAGNOSIS — I1310 Hypertensive heart and chronic kidney disease without heart failure, with stage 1 through stage 4 chronic kidney disease, or unspecified chronic kidney disease: Secondary | ICD-10-CM | POA: Diagnosis not present

## 2024-11-10 LAB — ECHOCARDIOGRAM COMPLETE
AR max vel: 2.46 cm2
AV Area VTI: 2.53 cm2
AV Area mean vel: 2.51 cm2
AV Mean grad: 4 mmHg
AV Peak grad: 6.6 mmHg
Ao pk vel: 1.28 m/s
Area-P 1/2: 2.78 cm2
S' Lateral: 4 cm

## 2024-11-10 NOTE — Progress Notes (Signed)
*  PRELIMINARY RESULTS* Echocardiogram 2D Echocardiogram has been performed.  Floydene Harder 11/10/2024, 10:37 AM

## 2024-11-10 NOTE — Telephone Encounter (Signed)
 Spoke to Sarah [name] via telephone and reviewed echo results. This continues to show similar pump to last check in 2023, pump of heart is mildly reduced, showing some congestive heart failure and the left ventricle remains slightly increased in size.  Right ventricle function is normal.  There is a small pericardial effusion, there is where fluid builds up in the sac surrounding the heart and it can affect pump of heart some. And some regurgitation remains at mitral valve. Reviewed at length with patient. Recommend she increase her Lasix  to 60 MG for the next 5 days until she sees PCP in office. She reports still having some swelling to feet, but no SOB. May need to refer to [facility]. Will discuss at her visit.

## 2024-11-12 NOTE — Patient Instructions (Incomplete)
 Heart Failure: Eating Plan Heart failure is a long-term condition where the heart can't pump enough blood through the body. When this happens, parts of the body don't get the blood and oxygen  they need. Living with heart failure can be hard. But a healthy lifestyle and choosing the right foods may help to improve your symptoms. If you have heart failure, your eating plan will include limiting the amount of salt, also called sodium, and unhealthy fats you eat. What are tips for following this plan? Reading food labels Check food labels for the amount of sodium per serving. Choose foods that have less than 140 mg (milligrams) of sodium in each serving. Check food labels for the number of calories per serving. This is important if you need to limit your daily calorie intake to lose weight. Check food labels for the serving size. If you eat more than one serving, you'll be eating more sodium and calories than what's listed on the label. Look for foods with the words sodium-free, very low sodium, or low sodium on the package. Foods labeled as reduced sodium, lightly salted, or no salt added may still have more sodium than what's recommended for you. Cooking Avoid adding salt when cooking. Before using any salt substitutes, talk with your health care provider or an expert in healthy eating called a dietitian. Season food with salt-free seasonings, spices, or herbs. Check the label of seasoning mixes to make sure they don't contain salt. Cook with heart-healthy oils, such as olive, canola, soybean, or sunflower oil. Do not fry foods. Cook foods using low-fat methods, like baking, boiling, grilling, and broiling. Limit unhealthy fats when cooking. To do this: Remove the skin from poultry, such as chicken. Remove all the fat you can see on meats. Skim the fat off from stews, soups, and gravies before serving them. Meal planning  Limit your intake of: Processed, canned, or prepackaged  foods. Foods that are high in trans fat, such as fried foods. Sweets, desserts, sugary drinks, and other foods with added sugar. Full-fat dairy products, such as whole milk. Eat a balanced diet. This may include: 4-5 servings of fruit each day and 4-5 servings of vegetables each day. At each meal, try to fill one-half of your plate with fruits and vegetables. Up to 6-8 servings of whole grains each day. Up to 2 servings of lean meat, poultry, or fish each day. One serving of meat is equal to 3 oz (85 g). This is about the same size as a deck of cards. 2 servings of low-fat dairy each day. Heart-healthy fats. Healthy fats called omega-3 fatty acids are a good choice. They're found in foods such as flaxseed and cold-water fish like sardines, salmon, and mackerel. Aim to eat 25-35 g (grams) of fiber a day. Foods that are high in fiber include apples, broccoli, carrots, beans, peas, and whole grains. Do not add salt or condiments that contain salt (such as soy sauce) to foods before eating. When eating at a restaurant, ask that your food be prepared with less salt or no salt, if possible. Try to eat 2 or more plant-based or meat-free meals each week. Cook more meals at home and eat less at restaurants, buffets, and fast food places. General information Do not eat more than 2,300 mg of sodium a day. The amount of sodium that's recommended for you may be lower, depending on your condition. Stay at a healthy weight as told. Ask your provider what a healthy weight is for you. Check  your weight every day. Work with your provider and dietitian to make a plan that will help you lose weight or stay at your current weight. Limit how much fluid you drink. Ask your provider or dietitian how much fluid you can have each day. Limit or avoid alcohol as told. Recommended foods Fruits All fresh, frozen, and canned fruits. Dried fruits, such as raisins, prunes, and cranberries. Vegetables All fresh vegetables.  Vegetables that are frozen without sauce or added salt. Low-sodium or sodium-free canned vegetables. Grains Bread with less than 80 mg of sodium per slice. Whole-wheat pasta, quinoa, and brown rice. Oats and oatmeal. Barley. Millet. Grits and cream of wheat. Whole-grain and whole-wheat cold cereal. Meats and other protein foods Lean cuts of meat. Skinless chicken and [place]. Fish with high omega-3 fatty acids, such as salmon, sardines, and other cold-water fishes. Eggs. Dried beans, peas, and edamame. Unsalted nuts and nut butters. Dairy Low-fat or nonfat (skim) milk and dried milk. Rice milk, soy milk, and almond milk. Low-fat or nonfat yogurt. Small amounts of reduced-sodium block cheese. Low-sodium cottage cheese. Fats and oils Olive, canola, soybean, flaxseed, avocado, or sunflower oil. Sweets and desserts Applesauce. Granola bars. Sugar-free pudding and gelatin. Frozen fruit bars. Seasoning and other foods Fresh and dried herbs. Lemon or lime juice. Vinegar. Low-sodium ketchup. Salt-free marinades, salad dressings, sauces, and seasonings. The items listed above may not be all the foods and drinks you can have. Talk with a dietitian to learn more. Foods to avoid Fruits Fruits that are dried with preservatives that contain sodium. Vegetables Canned vegetables. Frozen vegetables with sauce or seasonings. Creamed vegetables. [place] fries. Onion rings. Pickled vegetables and sauerkraut. Grains Bread with more than 80 mg of sodium per slice. Hot or cold cereal with more than 140 mg sodium per serving. Salted pretzels and crackers. Prepackaged breadcrumbs. Bagels, croissants, and biscuits. Meats and other protein foods Ribs and chicken wings. Bacon, ham, pepperoni, bologna, salami, and packaged luncheon meats. Hot dogs, bratwurst, and sausage. Canned meat. Smoked meat and fish. Salted nuts and seeds. Dairy Whole milk, half-and-half, and cream. Buttermilk. Processed cheese, cheese spreads, and  cheese curds. Regular cottage cheese. Feta cheese. Shredded cheese. String cheese. Fats and oils Butter, lard, shortening, ghee, and bacon fat. Canned and packaged gravies. Seasoning and other foods Onion salt, garlic salt, table salt, and sea salt. Marinades. Regular salad dressings. Relishes, pickles, and olives. Meat flavorings and tenderizers, and bouillon cubes. Horseradish, ketchup, and mustard. Worcestershire sauce. Teriyaki sauce, soy sauce (including reduced sodium). Hot sauce and Tabasco sauce. Steak sauce, fish sauce, oyster sauce, and cocktail sauce. Taco seasonings. Barbecue sauce. Tartar sauce. The items listed above may not be all the foods and drinks you should avoid. Talk with a dietitian to learn more. This information is not intended to replace advice given to you by your health care provider. Make sure you discuss any questions you have with your health care provider. Document Revised: 07/12/2023 Document Reviewed: 07/01/2023 Elsevier Patient Education  2024 [employer].

## 2024-11-12 NOTE — Progress Notes (Unsigned)
 11/13/2024 [name] [id number] September 29, 1951  Gastroenterology Office Note    Referring Provider: Valerio Melanie DASEN, NP Primary Care Physician:  Sarah Melanie DASEN, NP  Primary GI Provider: Jinny Carmine, MD    Chief Complaint   Chief Complaint  Patient presents with   New Patient (Initial Visit)    Diarrhea x 2 years-takes imodium-usually after meals-no abdominal pain-no constipation-has not   seen any blood      History of Present Illness   Sarah [name] is a 73 y.o. female with PMHX of renal cancer with left nephrectomy 12/2016, H. Pylori, and chronic diarrhea, presenting today at the request of Sarah Melanie T, NP due to chronic diarrhea.  Patient is accompanied today by her sister.  Patient reports that she has long history of having intermittent diarrhea or loose stools.  Her sister states that often when they called the patient that she will tell them she has to get off the phone because she needs to go to the bathroom, or if they want to go out to the store that she cannot go because she needs to go to the bathroom.  Upon further questioning, patient states that she may often have a bowel movement after she eats and it is a formed bowel movement, it is not always loose or diarrhea.  She states it is normal for her to have a bowel movement after meals or that sometimes she may have looser stools depending on what she eats.  Occasionally may she may take Imodium if she is having looser stools. States that spicy greasy foods are a trigger for her. She denies fevers, chills, abdominal pain.  She drinks about 3-4 water bottles a day.  Denies NSAID or alcohol use.  When asked about rectal bleeding or dark tarry stools, patient reports that she does not look at her stools or the tissue when she wipes.   Patient also with chronic renal insufficiency and recently had AV fistula placed placed if dialysis is needed in the future.  Patient also follows with Dr. Jacobo for  anemia in setting of stage IV chronic kidney disease. Receiving Aranesp .  Patient seen by PCP on 11/01/2024 with reports of bowel issues for 2 years.   Per epic notes, patient was seen by [facility] 11/11/2020 with loose stools.  He had positive H. pylori serology and treated for UTI. Last colonoscopy 2022 with [facility] clinic.  Unable to see reults in EPIC, will request records.  10/20/2024 CT Abdomen pelvis IMPRESSION: 1. Distal colonic diverticulosis without diverticulitis. 2. Cholelithiasis and gallbladder sludge. No evidence of acute cholecystitis on this unenhanced CT. 3. Abdominal aortic aneurysm measuring 3 cm. Recommend surveillance ultrasound in 3 years. Reference: Journal of Vascular Surgery 67.1 (2018): 2-77. J Am Coll Radiol 2013;10:789-794. 4. Numerous simple and hyperdense right renal cysts, previously evaluated by ultrasound. Prior left nephrectomy. 5. Findings compatible with congestive heart failure, with cardiomegaly, bibasilar airspace disease, and bilateral pleural effusions. 6.  Aortic Atherosclerosis  Past Medical History:  Diagnosis Date   Adrenal adenoma, right    Anemia of chronic renal failure    Anxiety    Aortic atherosclerosis    Arthritis    Benign neoplasm of breast 2012   CAD (coronary artery disease)    Cerebral aneurysm 2004   a.) s/p SAH with subsequent cerebral aneurysm coiling on 06/28/2003 --> Sapphire 10 coils were placed to a 4 mm PCA, 2 mm anterior choroidal, and 3 mm superior hypophyseal artery aneurysms;  b.) s/p coiling of a 4 mm RIGHT ICA aneurysm on 10/29/2003   Cholelithiasis    Diastolic dysfunction    Diverticulosis    Dyspnea    Emphysema lung (HCC)    ESRD (end stage renal disease) on dialysis [facility])    a.) s/p LEFT nephrectomy for RCC 01/01/2017   Former smoker    Headache    HLD (hyperlipidemia)    Hypertension    Obesity, unspecified    Pneumonia    Pulmonary hypertension associated with end-stage renal disease  (ESRD) on dialysis [facility])    Renal cell cancer, left (HCC) 2018   a.) s/p nephrectomy 01/01/2017   SAH (subarachnoid hemorrhage) (HCC) 06/27/2003   a.) s/p cerebral aneurysm coiling on 06/28/2003 --> Sapphire 10 coils were placed to a 4 mm PCA, 2 mm anterior choroidal, and 3 mm superior hypophyseal artery aneurysms    Past Surgical History:  Procedure Laterality Date   ABDOMINAL HYSTERECTOMY  1993   ANEURYSM COILING Left 06/28/2003   ANEURYSM COILING Right 10/29/2003   AV FISTULA PLACEMENT Left 08/16/2024   Procedure: ARTERIOVENOUS (AV) FISTULA CREATION;  Surgeon: Sarah Cordella MATSU, MD;  Location: [facility] ORS;  Service: Vascular;  Laterality: Left;  BRACHIOCEPHALIC   BREAST BIOPSY Right 2012   stereo bx showing a 6mm radial scar in an estimated 8 volume of tissue   BREAST BIOPSY Right 04/09/2005   Fibrocystic changes, ductal adenosis, microcalcifications, focal stromal/epithelial proliferation, possible fibroadenoma type proliferation   COLONOSCOPY  2008   Dr. Chipper   EYE SURGERY     as a child   FOOT SURGERY Right 2003   LAPAROSCOPIC NEPHRECTOMY, HAND ASSISTED Left 01/01/2017   Procedure: HAND ASSISTED LAPAROSCOPIC NEPHRECTOMY;  Surgeon: Sarah Lynwood Napoleon, MD;  Location: [facility] ORS;  Service: Urology;  Laterality: Left;   TONSILLECTOMY  1997    Current Outpatient Medications  Medication Sig Dispense Refill   acetaminophen  (TYLENOL ) 325 MG tablet Take 650 mg by mouth every 6 (six) hours as needed for mild pain, moderate pain or headache.      amLODipine  (NORVASC ) 10 MG tablet Take 10 mg by mouth daily.     atenolol  (TENORMIN ) 100 MG tablet Take 1 tablet (100 mg total) by mouth daily. 90 tablet 4   atorvastatin  (LIPITOR) 40 MG tablet Take 1 tablet (40 mg total) by mouth daily. 90 tablet 3   calcitRIOL  (ROCALTROL ) 0.25 MCG capsule Take 1 capsule (0.25 mcg total) by mouth daily. 30 capsule 11   Cholecalciferol (VITAMIN D-3 PO) Take 600 Units by mouth daily.     citalopram  (CELEXA ) 20 MG  tablet Take 1 tablet (20 mg total) by mouth daily. 90 tablet 4   ferrous sulfate 325 (65 FE) MG tablet Take 325 mg by mouth daily with breakfast.     furosemide  (LASIX ) 40 MG tablet Take 1 tablet (40 mg total) by mouth daily. 90 tablet 3   Loperamide HCl (IMODIUM PO) Take 2-4 mg by mouth 4 (four) times daily as needed (Diarrhea).     losartan  (COZAAR ) 25 MG tablet Take 1 tablet (25 mg total) by mouth daily. 90 tablet 3   Multiple Vitamin (MULTIVITAMIN) tablet Take 1 tablet by mouth daily.     No current facility-administered medications for this visit.    Allergies as of 11/13/2024 - Review Complete 11/13/2024  Allergen Reaction Noted   Codeine Itching 05/31/2013   Tramadol  hcl Other (See Comments) 08/09/2024    Family History  Problem Relation Age of Onset   Cancer  Cousin        maternal first cousin with breast cancer   Cancer Other        colon and ovarian cancers, no relationship listed   Cancer Father        prostate   Hypertension Father    Hypertension Sister    Hypertension Brother    Hypertension Paternal Grandmother    Heart disease Paternal Grandmother    Hypertension Paternal Grandfather    Heart disease Paternal Grandfather    Breast cancer Neg Hx     Social History   Socioeconomic History   Marital status: Divorced    Spouse name: Not on file   Number of children: 1   Years of education: Not on file   Highest education level: GED or equivalent  Occupational History    Comment: full time    Occupation: retired  Tobacco Use   Smoking status: Former    Current packs/day: 0.00    Average packs/day: 0.5 packs/day for 54.8 years (27.4 ttl pk-yrs)    Types: Cigarettes    Start date: 23    Quit date: 10/2023    Years since quitting: 1.1   Smokeless tobacco: Never  Vaping Use   Vaping status: Never Used  Substance and Sexual Activity   Alcohol use: Not Currently   Drug use: No   Sexual activity: Yes  Other Topics Concern   Not on file  Social  History Narrative   Live alones   Social Drivers of Health   Tobacco Use: Medium Risk (11/13/2024)   Patient History    Smoking Tobacco Use: Former    Smokeless Tobacco Use: Never    Passive Exposure: Not on Actuary Strain: Low Risk (06/29/2024)   Overall Financial Resource Strain (CARDIA)    Difficulty of Paying Living Expenses: Not hard at all  Food Insecurity: No Food Insecurity (06/29/2024)   Epic    Worried About Radiation Protection Practitioner of Food in the Last Year: Never true    Ran Out of Food in the Last Year: Never true  Transportation Needs: No Transportation Needs (06/29/2024)   Epic    Lack of Transportation (Medical): No    Lack of Transportation (Non-Medical): No  Physical Activity: Inactive (06/29/2024)   Exercise Vital Sign    Days of Exercise per Week: 0 days    Minutes of Exercise per Session: 0 min  Stress: No Stress Concern Present (06/29/2024)   [employer] of Occupational Health - Occupational Stress Questionnaire    Feeling of Stress: Not at all  Social Connections: Moderately Isolated (06/29/2024)   Social Connection and Isolation Panel    Frequency of Communication with Friends and Family: More than three times a week    Frequency of Social Gatherings with Friends and Family: Once a week    Attends Religious Services: More than 4 times per year    Active Member of [employer] or Organizations: No    Attends Banker Meetings: Never    Marital Status: Divorced  Catering Manager Violence: Not At Risk (06/29/2024)   Epic    Fear of Current or Ex-Partner: No    Emotionally Abused: No    Physically Abused: No    Sexually Abused: No  Depression (PHQ2-9): Low Risk (07/12/2024)   Depression (PHQ2-9)    PHQ-2 Score: 1  Alcohol Screen: Low Risk (06/29/2024)   Alcohol Screen    Last Alcohol Screening Score (AUDIT): 0  Housing: Low Risk (06/29/2024)  Epic    Unable to Pay for Housing in the Last Year: No    Number of Times Moved in the Last Year: 1     Homeless in the Last Year: No  Utilities: Not At Risk (06/29/2024)   Epic    Threatened with loss of utilities: No  Health Literacy: Adequate Health Literacy (06/29/2024)   B1300 Health Literacy    Frequency of need for help with medical instructions: Never     RELEVANT GI HISTORY, IMAGING AND LABS: CBC    Component Value Date/Time   WBC 6.6 11/01/2024 1010   WBC 8.3 10/20/2024 1812   RBC 3.37 (L) 11/01/2024 1010   RBC 3.39 (L) 10/20/2024 1812   HGB 9.6 (L) 11/01/2024 1010   HCT 30.9 (L) 11/01/2024 1010   PLT 244 11/01/2024 1010   MCV 92 11/01/2024 1010   MCH 28.5 11/01/2024 1010   MCH 28.6 10/20/2024 1812   MCHC 31.1 (L) 11/01/2024 1010   MCHC 32.3 10/20/2024 1812   RDW 15.7 (H) 11/01/2024 1010   LYMPHSABS 1.2 11/01/2024 1010   MONOABS 0.8 10/17/2024 1113   EOSABS 0.2 11/01/2024 1010   BASOSABS 0.0 11/01/2024 1010   Recent Labs    07/17/24 1033 08/09/24 1413 08/16/24 0708 09/15/24 1101 10/17/24 1113 10/20/24 1812 11/01/24 1010  HGB 9.1* 10.8* 10.2* 9.7* 9.9* 9.7* 9.6*    CMP     Component Value Date/Time   NA 143 11/01/2024 1010   K 3.7 11/01/2024 1010   CL 109 (H) 11/01/2024 1010   CO2 16 (L) 11/01/2024 1010   GLUCOSE 91 11/01/2024 1010   GLUCOSE 98 10/20/2024 1812   BUN 44 (H) 11/01/2024 1010   CREATININE 3.57 (H) 11/01/2024 1010   CREATININE 3.32 (H) 07/17/2024 1033   CALCIUM  9.0 11/01/2024 1010   PROT 6.5 11/01/2024 1010   ALBUMIN 3.9 11/01/2024 1010   AST 14 11/01/2024 1010   AST 16 07/17/2024 1033   ALT 10 11/01/2024 1010   ALT 9 07/17/2024 1033   ALKPHOS 66 11/01/2024 1010   BILITOT 0.2 11/01/2024 1010   BILITOT 0.5 07/17/2024 1033   GFRNONAA 12 (L) 10/20/2024 1812   GFRNONAA 14 (L) 07/17/2024 1033   GFRAA 36 (L) 01/03/2020 1151      Latest Ref Rng & Units 11/01/2024   10:10 AM 07/17/2024   10:33 AM 01/09/2022    9:09 AM  Hepatic Function  Total Protein 6.0 - 8.5 g/dL 6.5  7.2  7.4   Albumin 3.8 - 4.8 g/dL 3.9  3.7  3.7   AST 0 - 40  IU/L 14  16  18    ALT 0 - 32 IU/L 10  9  14    Alk Phosphatase 49 - 135 IU/L 66  58  60   Total Bilirubin 0.0 - 1.2 mg/dL 0.2  0.5  0.2       Review of Systems   All systems reviewed and negative except where noted in HPI.    Physical Exam  BP 120/70   Pulse 68   Temp 98.7 F (37.1 C)   Ht 5' 6 (1.676 m)   Wt 180 lb 6.4 oz (81.8 kg)   SpO2 97%   BMI 29.12 kg/m  No LMP recorded. Patient has had a hysterectomy. General:   Alert and oriented. Pleasant and cooperative. Well-nourished and well-developed. In no acute distress.  Head:  Normocephalic and atraumatic. Eyes:  Without icterus Ears:  Normal auditory acuity. Lungs:  Respirations even and unlabored.  Clear throughout to auscultation.   No wheezes, crackles, or rhonchi. No acute distress. Heart:  Regular rate and rhythm; no murmurs, clicks, rubs, or gallops. Abdomen:  Normal bowel sounds.  No bruits.  Soft, non-tender and non-distended without masses, hepatosplenomegaly or hernias noted.  No guarding or rebound tenderness.  Rectal:  Deferred. Msk:  Symmetrical without gross deformities. Normal posture. Extremities:  Without edema. Neurologic:  Alert and  oriented x4;  grossly normal neurologically. Skin:  Intact without significant lesions or rashes. Psych:  Alert and cooperative. Normal mood and affect.   Assessment & Plan   KOA PALLA is a 73 y.o. female presenting today with chronic diarrhea.   Chronic Diarrhea.  - will check celiac and fecal calprotectin - Start taking Metamucil or Benefiber and stay hydrated - Can take Imodium as needed as long as no bleeding or fevers - Can consider colonoscopy if no improvement  Chronic anemia due to Chronic kidney disease.  Patient follows with nephrology and oncology. Patient to keep scheduled appointment with Dr. Jacobo on 11/16/2024.   Follow up in 6 weeks  Grayce Bohr, DNP, AGNP-C [facility]

## 2024-11-13 ENCOUNTER — Ambulatory Visit: Admitting: Family Medicine

## 2024-11-13 ENCOUNTER — Encounter: Payer: Self-pay | Admitting: Family Medicine

## 2024-11-13 VITALS — BP 120/70 | HR 68 | Temp 98.7°F | Ht 66.0 in | Wt 180.4 lb

## 2024-11-13 DIAGNOSIS — K529 Noninfective gastroenteritis and colitis, unspecified: Secondary | ICD-10-CM | POA: Diagnosis not present

## 2024-11-13 DIAGNOSIS — D631 Anemia in chronic kidney disease: Secondary | ICD-10-CM

## 2024-11-13 DIAGNOSIS — N189 Chronic kidney disease, unspecified: Secondary | ICD-10-CM

## 2024-11-13 DIAGNOSIS — R197 Diarrhea, unspecified: Secondary | ICD-10-CM | POA: Diagnosis not present

## 2024-11-13 NOTE — Patient Instructions (Addendum)
 We faxed request to get your Colonoscopy and EGD reports from [facility].   Start taking metamucil or benefiber.

## 2024-11-14 ENCOUNTER — Other Ambulatory Visit: Payer: Self-pay | Admitting: Family Medicine

## 2024-11-14 DIAGNOSIS — R197 Diarrhea, unspecified: Secondary | ICD-10-CM | POA: Diagnosis not present

## 2024-11-14 LAB — CELIAC DISEASE AB SCREEN W/RFX
Deamidated Gliadin Abs, IgA: 4 U (ref 0–19)
Immunoglobulin A, (IgA) QN, Serum: 273 mg/dL (ref 64–422)
t-Transglutaminase (tTG) IgA: <2 U/mL (ref 0–3)

## 2024-11-15 ENCOUNTER — Ambulatory Visit: Payer: Self-pay | Admitting: Family Medicine

## 2024-11-15 ENCOUNTER — Telehealth: Payer: Self-pay

## 2024-11-15 ENCOUNTER — Other Ambulatory Visit: Payer: Self-pay | Admitting: *Deleted

## 2024-11-15 ENCOUNTER — Ambulatory Visit: Admitting: Nurse Practitioner

## 2024-11-15 DIAGNOSIS — D631 Anemia in chronic kidney disease: Secondary | ICD-10-CM

## 2024-11-16 ENCOUNTER — Encounter: Payer: Self-pay | Admitting: Oncology

## 2024-11-16 ENCOUNTER — Ambulatory Visit

## 2024-11-16 ENCOUNTER — Ambulatory Visit: Admitting: Oncology

## 2024-11-16 ENCOUNTER — Other Ambulatory Visit: Attending: Oncology

## 2024-11-16 VITALS — BP 136/59 | HR 77 | Temp 97.8°F | Resp 18 | Ht 66.0 in | Wt 182.0 lb

## 2024-11-16 DIAGNOSIS — Z87891 Personal history of nicotine dependence: Secondary | ICD-10-CM | POA: Insufficient documentation

## 2024-11-16 DIAGNOSIS — Z85528 Personal history of other malignant neoplasm of kidney: Secondary | ICD-10-CM | POA: Diagnosis not present

## 2024-11-16 DIAGNOSIS — D631 Anemia in chronic kidney disease: Secondary | ICD-10-CM | POA: Insufficient documentation

## 2024-11-16 DIAGNOSIS — N184 Chronic kidney disease, stage 4 (severe): Secondary | ICD-10-CM | POA: Diagnosis present

## 2024-11-16 DIAGNOSIS — N189 Chronic kidney disease, unspecified: Secondary | ICD-10-CM

## 2024-11-16 DIAGNOSIS — I129 Hypertensive chronic kidney disease with stage 1 through stage 4 chronic kidney disease, or unspecified chronic kidney disease: Secondary | ICD-10-CM | POA: Diagnosis present

## 2024-11-16 DIAGNOSIS — Z992 Dependence on renal dialysis: Secondary | ICD-10-CM | POA: Insufficient documentation

## 2024-11-16 DIAGNOSIS — Z905 Acquired absence of kidney: Secondary | ICD-10-CM | POA: Insufficient documentation

## 2024-11-16 LAB — CBC WITH DIFFERENTIAL/PLATELET
Abs Immature Granulocytes: 0.02 K/uL (ref 0.00–0.07)
Basophils Absolute: 0 K/uL (ref 0.0–0.1)
Basophils Relative: 1 %
Eosinophils Absolute: 0.2 K/uL (ref 0.0–0.5)
Eosinophils Relative: 3 %
HCT: 28.8 % — ABNORMAL LOW (ref 36.0–46.0)
Hemoglobin: 9.2 g/dL — ABNORMAL LOW (ref 12.0–15.0)
Immature Granulocytes: 0 %
Lymphocytes Relative: 29 %
Lymphs Abs: 1.9 K/uL (ref 0.7–4.0)
MCH: 28.3 pg (ref 26.0–34.0)
MCHC: 31.9 g/dL (ref 30.0–36.0)
MCV: 88.6 fL (ref 80.0–100.0)
Monocytes Absolute: 0.7 K/uL (ref 0.1–1.0)
Monocytes Relative: 11 %
Neutro Abs: 3.6 K/uL (ref 1.7–7.7)
Neutrophils Relative %: 56 %
Platelets: 261 K/uL (ref 150–400)
RBC: 3.25 MIL/uL — ABNORMAL LOW (ref 3.87–5.11)
RDW: 16.5 % — ABNORMAL HIGH (ref 11.5–15.5)
WBC: 6.4 K/uL (ref 4.0–10.5)
nRBC: 0 % (ref 0.0–0.2)

## 2024-11-16 MED ORDER — DARBEPOETIN ALFA 200 MCG/0.4ML IJ SOSY
200.0000 ug | PREFILLED_SYRINGE | Freq: Once | INTRAMUSCULAR | Status: AC
  Administered 2024-11-16: 11:00:00 200 ug via SUBCUTANEOUS
  Filled 2024-11-16: qty 0.4

## 2024-11-16 NOTE — Progress Notes (Signed)
 Patient Sarah [name]  [phone] [phone]  ID: Sarah [name] OB: August 18, 1951  MR#: [id number]  RDW#:[id number]  Patient Care Team: Valerio Melanie DASEN, NP as PCP - General (Nurse Practitioner) Jacobo Evalene PARAS, MD as Consulting Physician (Oncology) Carolee Manus DASEN., MD (Ophthalmology) Dennise Capri, MD as Consulting Physician (Nephrology) Perla Evalene PARAS, MD as Consulting Physician (Cardiology) Kathlynn Sharper, MD as Consulting Physician (Orthopedic Surgery)   CHIEF COMPLAINT: Anemia secondary to chronic renal insufficiency.  INTERVAL HISTORY: Patient returns to clinic today for repeat laboratory work, further evaluation, consideration of additional Aranesp .  She reports that she will be initiating dialysis in the near future.  She currently feels well and is asymptomatic.  She does not complain of any weakness or fatigue today.  She has no neurologic complaints.  She denies any recent fevers or illnesses.  She has a good appetite and denies weight loss. She denies any chest pain, shortness of breath, cough, or hemoptysis.  She has no abdominal pain.  She denies any nausea, vomiting, constipation, or diarrhea.  She has no melena or hematochezia.  She has no urinary complaints.  Patient offers no specific complaints today.  REVIEW OF SYSTEMS:   Review of Systems  Constitutional: Negative.  Negative for fever, malaise/fatigue and weight loss.  Respiratory: Negative.  Negative for cough and shortness of breath.   Cardiovascular: Negative.  Negative for chest pain and leg swelling.  Gastrointestinal: Negative.  Negative for abdominal pain.  Genitourinary: Negative.  Negative for flank pain and hematuria.  Musculoskeletal: Negative.  Negative for back pain.  Skin: Negative.  Negative for rash.  Neurological: Negative.  Negative for sensory change, focal weakness and weakness.  Psychiatric/Behavioral: Negative.  The patient is not nervous/anxious.      As per HPI. Otherwise, a complete review of systems is negative.  PAST MEDICAL HISTORY: Past Medical History:  Diagnosis Date   Adrenal adenoma, right    Anemia of chronic renal failure    Anxiety    Aortic atherosclerosis    Arthritis    Benign neoplasm of breast 2012   CAD (coronary artery disease)    Cerebral aneurysm 2004   a.) s/p SAH with subsequent cerebral aneurysm coiling on 06/28/2003 --> Sapphire 10 coils were placed to a 4 mm PCA, 2 mm anterior choroidal, and 3 mm superior hypophyseal artery aneurysms; b.) s/p coiling of a 4 mm RIGHT ICA aneurysm on 10/29/2003   Cholelithiasis    Diastolic dysfunction    Diverticulosis    Dyspnea    Emphysema lung (HCC)    ESRD (end stage renal disease) on dialysis [facility])    a.) s/p LEFT nephrectomy for RCC 01/01/2017   Former smoker    Headache    HLD (hyperlipidemia)    Hypertension    Obesity, unspecified    Pneumonia    Pulmonary hypertension associated with end-stage renal disease (ESRD) on dialysis (HCC)    Renal cell cancer, left (HCC) 2018   a.) s/p nephrectomy 01/01/2017   SAH (subarachnoid hemorrhage) (HCC) 06/27/2003   a.) s/p cerebral aneurysm coiling on 06/28/2003 --> Sapphire 10 coils were placed to a 4 mm PCA, 2 mm anterior choroidal, and 3 mm superior hypophyseal artery aneurysms    PAST SURGICAL HISTORY: Past Surgical History:  Procedure Laterality Date   ABDOMINAL HYSTERECTOMY  1993   ANEURYSM COILING Left 06/28/2003   ANEURYSM COILING Right 10/29/2003   AV FISTULA PLACEMENT Left 08/16/2024   Procedure: ARTERIOVENOUS (AV) FISTULA CREATION;  Surgeon:  Schnier, Cordella MATSU, MD;  Location: [facility] ORS;  Service: Vascular;  Laterality: Left;  BRACHIOCEPHALIC   BREAST BIOPSY Right 2012   stereo bx showing a 6mm radial scar in an estimated 8 volume of tissue   BREAST BIOPSY Right 04/09/2005   Fibrocystic changes, ductal adenosis, microcalcifications, focal stromal/epithelial proliferation, possible fibroadenoma type  proliferation   COLONOSCOPY  2008   Dr. Chipper   EYE SURGERY     as a child   FOOT SURGERY Right 2003   LAPAROSCOPIC NEPHRECTOMY, HAND ASSISTED Left 01/01/2017   Procedure: HAND ASSISTED LAPAROSCOPIC NEPHRECTOMY;  Surgeon: Redell Lynwood Napoleon, MD;  Location: [facility] ORS;  Service: Urology;  Laterality: Left;   TONSILLECTOMY  1997    FAMILY HISTORY: Family History  Problem Relation Age of Onset   Cancer Cousin        maternal first cousin with breast cancer   Cancer Other        colon and ovarian cancers, no relationship listed   Cancer Father        prostate   Hypertension Father    Hypertension Sister    Hypertension Brother    Hypertension Paternal Grandmother    Heart disease Paternal Grandmother    Hypertension Paternal Grandfather    Heart disease Paternal Grandfather    Breast cancer Neg Hx     ADVANCED DIRECTIVES (Y/N):  N  HEALTH MAINTENANCE: Social History   Tobacco Use   Smoking status: Former    Current packs/day: 0.00    Average packs/day: 0.5 packs/day for 54.8 years (27.4 ttl pk-yrs)    Types: Cigarettes    Start date: 69    Quit date: 10/2023    Years since quitting: 1.1   Smokeless tobacco: Never  Vaping Use   Vaping status: Never Used  Substance Use Topics   Alcohol use: Not Currently   Drug use: No     Colonoscopy:  PAP:  Bone density:  Lipid panel:  Allergies  Allergen Reactions   Codeine Itching   Tramadol  Hcl Other (See Comments)    , over sedated    Current Outpatient Medications  Medication Sig Dispense Refill   acetaminophen  (TYLENOL ) 325 MG tablet Take 650 mg by mouth every 6 (six) hours as needed for mild pain, moderate pain or headache.      amLODipine  (NORVASC ) 10 MG tablet Take 10 mg by mouth daily.     atenolol  (TENORMIN ) 100 MG tablet Take 1 tablet (100 mg total) by mouth daily. 90 tablet 4   atorvastatin  (LIPITOR) 40 MG tablet Take 1 tablet (40 mg total) by mouth daily. 90 tablet 3   calcitRIOL  (ROCALTROL ) 0.25 MCG  capsule Take 1 capsule (0.25 mcg total) by mouth daily. 30 capsule 11   Cholecalciferol (VITAMIN D-3 PO) Take 600 Units by mouth daily.     citalopram  (CELEXA ) 20 MG tablet Take 1 tablet (20 mg total) by mouth daily. 90 tablet 4   ferrous sulfate 325 (65 FE) MG tablet Take 325 mg by mouth daily with breakfast.     furosemide  (LASIX ) 40 MG tablet Take 1 tablet (40 mg total) by mouth daily. 90 tablet 3   Loperamide HCl (IMODIUM PO) Take 2-4 mg by mouth 4 (four) times daily as needed (Diarrhea).     losartan  (COZAAR ) 25 MG tablet Take 1 tablet (25 mg total) by mouth daily. 90 tablet 3   Multiple Vitamin (MULTIVITAMIN) tablet Take 1 tablet by mouth daily.     No current facility-administered medications  for this visit.    OBJECTIVE: Vitals:   11/16/24 1040  BP: (!) 136/59  Pulse: 77  Resp: 18  Temp: 97.8 F (36.6 C)  SpO2: 100%     Body mass index is 29.38 kg/m.    ECOG FS:0 - Asymptomatic  General: Well-developed, well-nourished, no acute distress. Eyes: Pink conjunctiva, anicteric sclera. HEENT: Normocephalic, moist mucous membranes. Lungs: No audible wheezing or coughing. Heart: Regular rate and rhythm. Abdomen: Soft, nontender, no obvious distention. Musculoskeletal: No edema, cyanosis, or clubbing. Neuro: Alert, answering all questions appropriately. Cranial nerves grossly intact. Skin: No rashes or petechiae noted. Psych: Normal affect.  LAB RESULTS:  Lab Results  Component Value Date   NA 143 11/01/2024   K 3.7 11/01/2024   CL 109 (H) 11/01/2024   CO2 16 (L) 11/01/2024   GLUCOSE 91 11/01/2024   BUN 44 (H) 11/01/2024   CREATININE 3.57 (H) 11/01/2024   CALCIUM  9.0 11/01/2024   PROT 6.5 11/01/2024   ALBUMIN 3.9 11/01/2024   AST 14 11/01/2024   ALT 10 11/01/2024   ALKPHOS 66 11/01/2024   BILITOT 0.2 11/01/2024   GFRNONAA 12 (L) 10/20/2024   GFRAA 36 (L) 01/03/2020    Lab Results  Component Value Date   WBC 6.4 11/16/2024   NEUTROABS 3.6 11/16/2024   HGB 9.2  (L) 11/16/2024   HCT 28.8 (L) 11/16/2024   MCV 88.6 11/16/2024   PLT 261 11/16/2024     STUDIES: ECHOCARDIOGRAM COMPLETE Result Date: 11/10/2024    ECHOCARDIOGRAM REPORT   Patient Name:   Sarah [name] Date of Exam: 11/10/2024 Medical Rec #:  [id number]        Height:       66.0 in Accession #:    [id number]       Weight:       189.6 lb Date of Birth:  November 12, 1951         BSA:          1.955 m Patient Age:    73 years         BP:           128/68 mmHg Patient Gender: F                HR:           73 bpm. Exam Location:  [facility] Procedure: 2D Echo, 3D Echo, Cardiac Doppler, Color Doppler and Strain Analysis            (Both Spectral and Color Flow Doppler were utilized during            procedure). Indications:     Cardiomegaly I51.7  History:         Patient has prior history of Echocardiogram examinations, most                  recent 02/04/2022. Signs/Symptoms:Dyspnea; Risk                  Factors:Hypertension. Anxiety.  Sonographer:     Christopher Furnace Referring Phys:  [id number] JOLENE T CANNADY Diagnosing Phys: Redell Cave MD  Sonographer Comments: Global longitudinal strain was attempted. IMPRESSIONS  1. Left ventricular ejection fraction, by estimation, is 50 to 55%. Left ventricular ejection fraction by PLAX is 54 %. The left ventricle has low normal function. The left ventricle has no regional wall motion abnormalities. The left ventricular internal cavity size was mildly dilated. There is mild left ventricular hypertrophy. Left ventricular diastolic parameters were normal.  2. Right ventricular systolic function is normal. The right ventricular size is normal.  3. Left atrial size was moderately dilated.  4. A small pericardial effusion is present.  5. The mitral valve is normal in structure. Moderate mitral valve regurgitation.  6. The aortic valve is tricuspid. Aortic valve regurgitation is mild.  7. The inferior vena cava is normal in size with greater than 50% respiratory variability, suggesting  right atrial pressure of 3 mmHg. FINDINGS  Left Ventricle: Left ventricular ejection fraction, by estimation, is 50 to 55%. Left ventricular ejection fraction by PLAX is 54 %. The left ventricle has low normal function. The left ventricle has no regional wall motion abnormalities. Global longitudinal strain performed but not reported based on interpreter judgement due to suboptimal tracking. The left ventricular internal cavity size was mildly dilated. There is mild left ventricular hypertrophy. Left ventricular diastolic parameters were  normal. Right Ventricle: The right ventricular size is normal. No increase in right ventricular wall thickness. Right ventricular systolic function is normal. Left Atrium: Left atrial size was moderately dilated. Right Atrium: Right atrial size was normal in size. Pericardium: A small pericardial effusion is present. Mitral Valve: The mitral valve is normal in structure. Moderate mitral valve regurgitation. Tricuspid Valve: The tricuspid valve is normal in structure. Tricuspid valve regurgitation is mild. Aortic Valve: The aortic valve is tricuspid. Aortic valve regurgitation is mild. Aortic valve mean gradient measures 4.0 mmHg. Aortic valve peak gradient measures 6.6 mmHg. Aortic valve area, by VTI measures 2.53 cm. Pulmonic Valve: The pulmonic valve was not well visualized. Pulmonic valve regurgitation is not visualized. Aorta: The aortic root is normal in size and structure. Venous: The inferior vena cava is normal in size with greater than 50% respiratory variability, suggesting right atrial pressure of 3 mmHg. IAS/Shunts: No atrial level shunt detected by color flow Doppler.  LEFT VENTRICLE PLAX 2D LV EF:         Left            Diastology                ventricular     LV e' medial:    6.96 cm/s                ejection        LV E/e' medial:  14.2                fraction by     LV e' lateral:   8.16 cm/s                PLAX is 54      LV E/e' lateral: 12.1                %.  LVIDd:         5.60 cm LVIDs:         4.00 cm LV PW:         1.20 cm LV IVS:        1.30 cm LVOT diam:     2.30 cm LV SV:         65 LV SV Index:   33 LVOT Area:     4.15 cm LV IVRT:       82 msec  RIGHT VENTRICLE RV Basal diam:  3.60 cm RV Mid diam:    2.90 cm RV S prime:     11.70 cm/s TAPSE (M-mode): 3.3 cm LEFT ATRIUM  Index        RIGHT ATRIUM           Index LA diam:      4.30 cm 2.20 cm/m   RA Area:     14.80 cm LA Vol (A2C): 87.5 ml 44.76 ml/m  RA Volume:   28.40 ml  14.53 ml/m LA Vol (A4C): 87.2 ml 44.61 ml/m  AORTIC VALVE AV Area (Vmax):    2.46 cm AV Area (Vmean):   2.51 cm AV Area (VTI):     2.53 cm AV Vmax:           128.00 cm/s AV Vmean:          89.100 cm/s AV VTI:            0.256 m AV Peak Grad:      6.6 mmHg AV Mean Grad:      4.0 mmHg LVOT Vmax:         75.70 cm/s LVOT Vmean:        53.900 cm/s LVOT VTI:          0.156 m LVOT/AV VTI ratio: 0.61  AORTA Ao Root diam: 2.80 cm MITRAL VALVE               TRICUSPID VALVE MV Area (PHT): 2.78 cm    TR Peak grad:   44.4 mmHg MV Decel Time: 273 msec    TR Vmax:        333.00 cm/s MV E velocity: 98.50 cm/s MV A velocity: 84.40 cm/s  SHUNTS MV E/A ratio:  1.17        Systemic VTI:  0.16 m                            Systemic Diam: 2.30 cm Redell Cave MD Electronically signed by Redell Cave MD Signature Date/Time: 11/10/2024/1:14:03 PM    Final    CT ABDOMEN PELVIS WO CONTRAST Result Date: 10/20/2024 CLINICAL DATA:  Left lower quadrant abdominal pain, cough, weakness EXAM: CT ABDOMEN AND PELVIS WITHOUT CONTRAST TECHNIQUE: Multidetector CT imaging of the abdomen and pelvis was performed following the standard protocol without IV contrast. Unenhanced CT was performed per clinician order. Lack of IV contrast limits sensitivity and specificity, especially for evaluation of abdominal/pelvic solid viscera. RADIATION DOSE REDUCTION: This exam was performed according to the departmental dose-optimization program which includes  automated exposure control, adjustment of the mA and/or kV according to patient size and/or use of iterative reconstruction technique. COMPARISON:  01/09/2022 FINDINGS: Lower chest: Unenhanced imaging of the heart demonstrates cardiomegaly without pericardial effusion. Bilateral pleural effusions, right greater than left. Scattered ground-glass opacities within the lung bases compatible with edema or infection. Hepatobiliary: Unremarkable unenhanced appearance of the liver. There is a 2 cm gallstone within the gallbladder neck. High attenuation material within the gallbladder lumen likely reflects sludge. No evidence of gallbladder wall thickening or pericholecystic fluid. Pancreas: Unremarkable unenhanced appearance. Spleen: Unremarkable unenhanced appearance. Adrenals/Urinary Tract: Prior left nephrectomy. Numerous simple and hyperdense right renal cysts are again noted. Evaluation is limited without IV contrast. No urinary tract calculi or obstructive uropathy within the right kidney. The bladder is unremarkable. Stable 3 cm right adrenal lesion previously characterized as adrenal adenoma. Stomach/Bowel: No bowel obstruction or ileus. Normal appendix right lower quadrant. Distal colonic diverticulosis without diverticulitis. No bowel wall thickening or inflammatory change. Vascular/Lymphatic: Diffuse aortic atherosclerosis. Fusiform 3 cm infrarenal abdominal aortic aneurysm. Assessment of the vascular lumen cannot  be performed without intravenous contrast. No pathologic adenopathy. Reproductive: Status post hysterectomy. No adnexal masses. Other: No free fluid or free intraperitoneal gas. No abdominal wall hernia. Musculoskeletal: No acute or destructive bony abnormalities. Reconstructed images demonstrate no additional findings. IMPRESSION: 1. Distal colonic diverticulosis without diverticulitis. 2. Cholelithiasis and gallbladder sludge. No evidence of acute cholecystitis on this unenhanced CT. 3. Abdominal  aortic aneurysm measuring 3 cm. Recommend surveillance ultrasound in 3 years. Reference: Journal of Vascular Surgery 67.1 (2018): 2-77. J Am Coll Radiol 2013;10:789-794. 4. Numerous simple and hyperdense right renal cysts, previously evaluated by ultrasound. Prior left nephrectomy. 5. Findings compatible with congestive heart failure, with cardiomegaly, bibasilar airspace disease, and bilateral pleural effusions. 6.  Aortic Atherosclerosis (ICD10-I70.0). Electronically Signed   By: Ozell Daring M.D.   On: 10/20/2024 20:06   DG Chest 2 View Result Date: 10/20/2024 EXAM: 2 VIEW(S) XRAY OF THE CHEST 10/20/2024 07:14:00 PM COMPARISON: 11/02/2017 CLINICAL HISTORY: weakness FINDINGS: LUNGS AND PLEURA: Interstitial prominence likely reflects interstitial edema. Suspect small bilateral effusions. No pneumothorax. HEART AND MEDIASTINUM: Cardiomegaly, vascular congestion. BONES AND SOFT TISSUES: No acute osseous abnormality. IMPRESSION: 1. Cardiomegaly and vascular congestion. 2. Interstitial prominence likely reflecting interstitial edema. 3. Suspected small bilateral pleural effusions. Electronically signed by: Franky Crease MD 10/20/2024 07:20 PM EST RP Workstation: [id number]     ASSESSMENT: Anemia secondary to chronic renal insufficiency.  PLAN:  Anemia secondary to chronic renal insufficiency: Chronic and unchanged.  Patient's hemoglobin is 9.2 today.  Previously, patient's iron  stores were reported within normal limits.  Proceed with 200 mcg Aranesp  today.  Patient reports she is now starting dialysis and likely can receive treatment there.  No further follow-up has been scheduled.  Please refer patient back if there are any questions or concerns.  Renal insufficiency: Stage IV.  Patient previously reported nephrology has referred her for possible transplantation.  She only has 1 kidney after her nephrectomy for renal cell carcinoma.  Patient reports she is starting dialysis.  No further follow-up is  necessary.   History of stage III left renal cell carcinoma: Patient underwent left nephrectomy on January 01, 2017.  She did not have any adjuvant treatment.  No evidence of disease.    I spent a total of 30 minutes reviewing chart data, face-to-face evaluation with the patient, counseling and coordination of care as detailed above.    Patient expressed understanding and was in agreement with this plan. She also understands that She can call clinic at any time with any questions, concerns, or complaints.    Cancer Staging  Renal cancer, left [facility]) Staging form: Kidney, AJCC 8th Edition - Clinical stage from 01/26/2017: Stage III (cT3a, cNX, cM0) - Signed by Jacobo Evalene PARAS, MD on 01/26/2017 Histologic grade (G): G2 Histologic grading system: 4 grade system Laterality: Left Histologic sub-type: Clear cell   Evalene PARAS Jacobo, MD   11/16/2024 12:18 PM

## 2024-11-16 NOTE — Telephone Encounter (Signed)
 error

## 2024-11-16 NOTE — Progress Notes (Signed)
 Patient has a pic line in her right arm due to getting ready for dialysis.

## 2024-11-18 LAB — CALPROTECTIN, FECAL: Calprotectin, Fecal: 8 ug/g (ref 0–120)

## 2024-11-19 ENCOUNTER — Ambulatory Visit: Payer: Self-pay | Admitting: Family Medicine

## 2024-11-19 NOTE — Patient Instructions (Signed)
 Heart Failure: Eating Plan Heart failure is a long-term condition where the heart can't pump enough blood through the body. When this happens, parts of the body don't get the blood and oxygen  they need. Living with heart failure can be hard. But a healthy lifestyle and choosing the right foods may help to improve your symptoms. If you have heart failure, your eating plan will include limiting the amount of salt, also called sodium, and unhealthy fats you eat. What are tips for following this plan? Reading food labels Check food labels for the amount of sodium per serving. Choose foods that have less than 140 mg (milligrams) of sodium in each serving. Check food labels for the number of calories per serving. This is important if you need to limit your daily calorie intake to lose weight. Check food labels for the serving size. If you eat more than one serving, you'll be eating more sodium and calories than what's listed on the label. Look for foods with the words sodium-free, very low sodium, or low sodium on the package. Foods labeled as reduced sodium, lightly salted, or no salt added may still have more sodium than what's recommended for you. Cooking Avoid adding salt when cooking. Before using any salt substitutes, talk with your health care provider or an expert in healthy eating called a dietitian. Season food with salt-free seasonings, spices, or herbs. Check the label of seasoning mixes to make sure they don't contain salt. Cook with heart-healthy oils, such as olive, canola, soybean, or sunflower oil. Do not fry foods. Cook foods using low-fat methods, like baking, boiling, grilling, and broiling. Limit unhealthy fats when cooking. To do this: Remove the skin from poultry, such as chicken. Remove all the fat you can see on meats. Skim the fat off from stews, soups, and gravies before serving them. Meal planning  Limit your intake of: Processed, canned, or prepackaged  foods. Foods that are high in trans fat, such as fried foods. Sweets, desserts, sugary drinks, and other foods with added sugar. Full-fat dairy products, such as whole milk. Eat a balanced diet. This may include: 4-5 servings of fruit each day and 4-5 servings of vegetables each day. At each meal, try to fill one-half of your plate with fruits and vegetables. Up to 6-8 servings of whole grains each day. Up to 2 servings of lean meat, poultry, or fish each day. One serving of meat is equal to 3 oz (85 g). This is about the same size as a deck of cards. 2 servings of low-fat dairy each day. Heart-healthy fats. Healthy fats called omega-3 fatty acids are a good choice. They're found in foods such as flaxseed and cold-water fish like sardines, salmon, and mackerel. Aim to eat 25-35 g (grams) of fiber a day. Foods that are high in fiber include apples, broccoli, carrots, beans, peas, and whole grains. Do not add salt or condiments that contain salt (such as soy sauce) to foods before eating. When eating at a restaurant, ask that your food be prepared with less salt or no salt, if possible. Try to eat 2 or more plant-based or meat-free meals each week. Cook more meals at home and eat less at restaurants, buffets, and fast food places. General information Do not eat more than 2,300 mg of sodium a day. The amount of sodium that's recommended for you may be lower, depending on your condition. Stay at a healthy weight as told. Ask your provider what a healthy weight is for you. Check  your weight every day. Work with your provider and dietitian to make a plan that will help you lose weight or stay at your current weight. Limit how much fluid you drink. Ask your provider or dietitian how much fluid you can have each day. Limit or avoid alcohol as told. Recommended foods Fruits All fresh, frozen, and canned fruits. Dried fruits, such as raisins, prunes, and cranberries. Vegetables All fresh vegetables.  Vegetables that are frozen without sauce or added salt. Low-sodium or sodium-free canned vegetables. Grains Bread with less than 80 mg of sodium per slice. Whole-wheat pasta, quinoa, and brown rice. Oats and oatmeal. Barley. Millet. Grits and cream of wheat. Whole-grain and whole-wheat cold cereal. Meats and other protein foods Lean cuts of meat. Skinless chicken and [place]. Fish with high omega-3 fatty acids, such as salmon, sardines, and other cold-water fishes. Eggs. Dried beans, peas, and edamame. Unsalted nuts and nut butters. Dairy Low-fat or nonfat (skim) milk and dried milk. Rice milk, soy milk, and almond milk. Low-fat or nonfat yogurt. Small amounts of reduced-sodium block cheese. Low-sodium cottage cheese. Fats and oils Olive, canola, soybean, flaxseed, avocado, or sunflower oil. Sweets and desserts Applesauce. Granola bars. Sugar-free pudding and gelatin. Frozen fruit bars. Seasoning and other foods Fresh and dried herbs. Lemon or lime juice. Vinegar. Low-sodium ketchup. Salt-free marinades, salad dressings, sauces, and seasonings. The items listed above may not be all the foods and drinks you can have. Talk with a dietitian to learn more. Foods to avoid Fruits Fruits that are dried with preservatives that contain sodium. Vegetables Canned vegetables. Frozen vegetables with sauce or seasonings. Creamed vegetables. [place] fries. Onion rings. Pickled vegetables and sauerkraut. Grains Bread with more than 80 mg of sodium per slice. Hot or cold cereal with more than 140 mg sodium per serving. Salted pretzels and crackers. Prepackaged breadcrumbs. Bagels, croissants, and biscuits. Meats and other protein foods Ribs and chicken wings. Bacon, ham, pepperoni, bologna, salami, and packaged luncheon meats. Hot dogs, bratwurst, and sausage. Canned meat. Smoked meat and fish. Salted nuts and seeds. Dairy Whole milk, half-and-half, and cream. Buttermilk. Processed cheese, cheese spreads, and  cheese curds. Regular cottage cheese. Feta cheese. Shredded cheese. String cheese. Fats and oils Butter, lard, shortening, ghee, and bacon fat. Canned and packaged gravies. Seasoning and other foods Onion salt, garlic salt, table salt, and sea salt. Marinades. Regular salad dressings. Relishes, pickles, and olives. Meat flavorings and tenderizers, and bouillon cubes. Horseradish, ketchup, and mustard. Worcestershire sauce. Teriyaki sauce, soy sauce (including reduced sodium). Hot sauce and Tabasco sauce. Steak sauce, fish sauce, oyster sauce, and cocktail sauce. Taco seasonings. Barbecue sauce. Tartar sauce. The items listed above may not be all the foods and drinks you should avoid. Talk with a dietitian to learn more. This information is not intended to replace advice given to you by your health care provider. Make sure you discuss any questions you have with your health care provider. Document Revised: 07/12/2023 Document Reviewed: 07/01/2023 Elsevier Patient Education  2024 [employer].

## 2024-11-20 NOTE — Telephone Encounter (Signed)
 Left detailed message stating fecal cal protectin is normal and no inflammatory bowel disease. Left desk number for patient to return call.

## 2024-11-21 ENCOUNTER — Encounter: Payer: Self-pay | Admitting: Nurse Practitioner

## 2024-11-21 ENCOUNTER — Ambulatory Visit: Admitting: Nurse Practitioner

## 2024-11-21 VITALS — BP 128/80 | HR 99 | Temp 98.6°F | Resp 20 | Ht 65.98 in | Wt 176.0 lb

## 2024-11-21 DIAGNOSIS — N184 Chronic kidney disease, stage 4 (severe): Secondary | ICD-10-CM

## 2024-11-21 DIAGNOSIS — I1310 Hypertensive heart and chronic kidney disease without heart failure, with stage 1 through stage 4 chronic kidney disease, or unspecified chronic kidney disease: Secondary | ICD-10-CM

## 2024-11-21 DIAGNOSIS — J439 Emphysema, unspecified: Secondary | ICD-10-CM | POA: Diagnosis not present

## 2024-11-21 MED ORDER — DOXYCYCLINE HYCLATE 100 MG PO TABS: 100.0000 mg | ORAL_TABLET | Freq: Two times a day (BID) | ORAL | 0 refills | Status: AC

## 2024-11-21 NOTE — Progress Notes (Signed)
 "  BP 128/80 (BP Location: Right Arm, Patient Position: Sitting, Cuff Size: Large)   Pulse 99   Temp 98.6 F (37 C) (Oral)   Resp 20   Ht 5' 5.98 (1.676 m)   Wt 176 lb (79.8 kg)   SpO2 93%   BMI 28.42 kg/m    Subjective:    Patient ID: Sarah [name], female    DOB: 1951-11-22, 73 y.o.   MRN: [id number]  HPI: Sarah [name] is a 73 y.o. female  Chief Complaint  Patient presents with   Follow-up    Follow up appointment, has been shortness of breath since last week.   HYPERTENSION with ESRD Presents today for follow-up for SOB and edema. Wast noted to have cardiomyopathy on CXR 10/20/24. We increased Lasix  for brief period and recommended compression hose daily. Last saw cardiology on 04/17/24. Recent labs did note trend down in iron  level and elevation in BNP but 304 on correction with age and kidney disease. Has lost 6 lbs since last visit, she feels like swelling has improved.  Has been coughing a lot more, started over one week ago + has been SOB.  She feels the SOB is a little worse. Had been doing better prior to this. Saw oncology last on 11/16/24 and received iron  infusion that day.  Continues to take Amlodipine , Atenolol , Losartan , Lasix , Atorvastatin . Smoker in the past, has not smoked in one year. Hypertension status: stable  Satisfied with current treatment? yes Duration of hypertension: chronic BP monitoring frequency:  not checking BP range:  BP medication side effects:  no Medication compliance: good compliance Aspirin: no Recurrent headaches: no Visual changes: no Palpitations: no Dyspnea: yes Chest pain: no Lower extremity edema: yes improving Dizzy/lightheaded: no   Relevant past medical, surgical, family and social history reviewed and updated as indicated. Interim medical history since our last visit reviewed. Allergies and medications reviewed and updated.  Review of Systems  Constitutional:  Negative for activity change, appetite change,  diaphoresis, fatigue and fever.  Respiratory:  Positive for cough, shortness of breath and wheezing. Negative for chest tightness.   Cardiovascular:  Positive for leg swelling (improving). Negative for chest pain and palpitations.  Gastrointestinal: Negative.   Neurological:  Negative for dizziness, syncope, weakness, light-headedness, numbness and headaches.  Psychiatric/Behavioral: Negative.      Per HPI unless specifically indicated above     Objective:    BP 128/80 (BP Location: Right Arm, Patient Position: Sitting, Cuff Size: Large)   Pulse 99   Temp 98.6 F (37 C) (Oral)   Resp 20   Ht 5' 5.98 (1.676 m)   Wt 176 lb (79.8 kg)   SpO2 93%   BMI 28.42 kg/m   Wt Readings from Last 3 Encounters:  11/21/24 176 lb (79.8 kg)  11/16/24 182 lb (82.6 kg)  11/13/24 180 lb 6.4 oz (81.8 kg)    Physical Exam Vitals and nursing note reviewed.  Constitutional:      General: She is awake. She is not in acute distress.    Appearance: She is well-developed and well-groomed. She is not ill-appearing or toxic-appearing.  HENT:     Head: Normocephalic.     Right Ear: Hearing and external ear normal.     Left Ear: Hearing and external ear normal.  Eyes:     General: Lids are normal.        Right eye: No discharge.        Left eye: No discharge.  Conjunctiva/sclera: Conjunctivae normal.     Pupils: Pupils are equal, round, and reactive to light.  Neck:     Thyroid : No thyromegaly.     Vascular: No carotid bruit.  Cardiovascular:     Rate and Rhythm: Normal rate and regular rhythm.     Heart sounds: Normal heart sounds. No murmur heard.    No gallop.  Pulmonary:     Effort: Pulmonary effort is normal. No accessory muscle usage or respiratory distress.     Breath sounds: Wheezing (Expiratory wheezes noted throughout.) present. No decreased breath sounds or rales.  Abdominal:     General: Bowel sounds are normal. There is no distension.     Palpations: Abdomen is soft.      Tenderness: There is no abdominal tenderness.  Musculoskeletal:     Cervical back: Normal range of motion and neck supple.     Right lower leg: 1+ Edema present.     Left lower leg: 1+ Edema present.  Lymphadenopathy:     Cervical: No cervical adenopathy.  Skin:    General: Skin is warm and dry.  Neurological:     Mental Status: She is alert and oriented to person, place, and time.     Deep Tendon Reflexes: Reflexes are normal and symmetric.     Reflex Scores:      Brachioradialis reflexes are 2+ on the right side and 2+ on the left side.      Patellar reflexes are 2+ on the right side and 2+ on the left side. Psychiatric:        Attention and Perception: Attention normal.        Mood and Affect: Mood normal.        Speech: Speech normal.        Behavior: Behavior normal. Behavior is cooperative.        Thought Content: Thought content normal.     Results for orders placed or performed in visit on 11/16/24  CBC with Differential/Platelet   Collection Time: 11/16/24 10:18 AM  Result Value Ref Range   WBC 6.4 4.0 - 10.5 K/uL   RBC 3.25 (L) 3.87 - 5.11 MIL/uL   Hemoglobin 9.2 (L) 12.0 - 15.0 g/dL   HCT 71.1 (L) 63.9 - 53.9 %   MCV 88.6 80.0 - 100.0 fL   MCH 28.3 26.0 - 34.0 pg   MCHC 31.9 30.0 - 36.0 g/dL   RDW 83.4 (H) 88.4 - 84.4 %   Platelets 261 150 - 400 K/uL   nRBC 0.0 0.0 - 0.2 %   Neutrophils Relative % 56 %   Neutro Abs 3.6 1.7 - 7.7 K/uL   Lymphocytes Relative 29 %   Lymphs Abs 1.9 0.7 - 4.0 K/uL   Monocytes Relative 11 %   Monocytes Absolute 0.7 0.1 - 1.0 K/uL   Eosinophils Relative 3 %   Eosinophils Absolute 0.2 0.0 - 0.5 K/uL   Basophils Relative 1 %   Basophils Absolute 0.0 0.0 - 0.1 K/uL   Immature Granulocytes 0 %   Abs Immature Granulocytes 0.02 0.00 - 0.07 K/uL      Assessment & Plan:   Problem List Items Addressed This Visit       Cardiovascular and Mediastinum   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 BP at goal for age in office today on recheck.  Followed by nephrology. Recommend she continue to check at  home and monitor diet, DASH diet. Continue current medication regimen and adjust as needed + collaboration with nephrology.  Recent labs with nephrology obtained and reviewed today.  Edema has improved today to BLE.        Respiratory   Pulmonary emphysema (HCC)   Chronic, stable without inhalers at this time. Suspect mild exacerbation at present due to heater not working in home.  Recommend continued cessation of smoking.  Spirometry March 2023 noting FEV1 96% and FEV1/FVC 112% -- reviewed with patient at the time.  Continue yearly lung screening + collaboration with cardiology.  Recommend she return for lung screening, refuses at this time. Will send in Doxycycline  100 MG BID for 7 days, educated on this.        Follow up plan: Return for as scheduled February 13th.      "

## 2024-11-21 NOTE — Assessment & Plan Note (Signed)
 Chronic, ongoing. BP at goal for age in office today on recheck.  Followed by nephrology. Recommend she continue to check at home and monitor diet, DASH diet. Continue current medication regimen and adjust as needed + collaboration with nephrology.  Recent labs with nephrology obtained and reviewed today.  Edema has improved today to BLE.

## 2024-11-21 NOTE — Assessment & Plan Note (Signed)
 Chronic, stable without inhalers at this time. Suspect mild exacerbation at present due to heater not working in home.  Recommend continued cessation of smoking.  Spirometry March 2023 noting FEV1 96% and FEV1/FVC 112% -- reviewed with patient at the time.  Continue yearly lung screening + collaboration with cardiology.  Recommend she return for lung screening, refuses at this time. Will send in Doxycycline  100 MG BID for 7 days, educated on this.

## 2024-12-08 ENCOUNTER — Encounter: Payer: Self-pay | Admitting: Nurse Practitioner

## 2024-12-20 ENCOUNTER — Other Ambulatory Visit: Payer: Self-pay | Admitting: Family Medicine

## 2024-12-20 NOTE — Progress Notes (Signed)
 Last colonoscopy 12/24/2020.  - One 4 mm polyp in the cecum.  Tubular adenoma.  - One 8 mm polyp in the proximal ascending colon.  Tubular adenoma, negative for high-grade dysplasia and malignancy - One 7 mm polyp in the rectum.  Hyperplastic polyp. - Nonbleeding internal hemorrhoids, otherwise normal. - Recommendations to repeat in 5 years.

## 2025-01-01 ENCOUNTER — Ambulatory Visit: Admitting: Family Medicine

## 2025-01-11 ENCOUNTER — Ambulatory Visit: Admitting: Family Medicine

## 2025-01-12 ENCOUNTER — Ambulatory Visit: Admitting: Family Medicine

## 2025-01-12 ENCOUNTER — Ambulatory Visit: Admitting: Nurse Practitioner

## 2025-07-12 ENCOUNTER — Ambulatory Visit
